# Patient Record
Sex: Male | Born: 1950 | Race: White | Hispanic: No | Marital: Married | Smoking: Never smoker
Health system: Southern US, Community
[De-identification: ages and names within clinical notes are randomized; demographics above are authoritative.]

## PROBLEM LIST (undated history)

## (undated) DIAGNOSIS — C801 Malignant (primary) neoplasm, unspecified: Secondary | ICD-10-CM

## (undated) DIAGNOSIS — C61 Malignant neoplasm of prostate: Secondary | ICD-10-CM

## (undated) DIAGNOSIS — E785 Hyperlipidemia, unspecified: Secondary | ICD-10-CM

## (undated) DIAGNOSIS — K219 Gastro-esophageal reflux disease without esophagitis: Secondary | ICD-10-CM

## (undated) DIAGNOSIS — T8859XA Other complications of anesthesia, initial encounter: Secondary | ICD-10-CM

## (undated) DIAGNOSIS — H9190 Unspecified hearing loss, unspecified ear: Secondary | ICD-10-CM

## (undated) DIAGNOSIS — K5900 Constipation, unspecified: Secondary | ICD-10-CM

## (undated) DIAGNOSIS — M1990 Unspecified osteoarthritis, unspecified site: Secondary | ICD-10-CM

## (undated) DIAGNOSIS — F909 Attention-deficit hyperactivity disorder, unspecified type: Secondary | ICD-10-CM

## (undated) DIAGNOSIS — T7840XA Allergy, unspecified, initial encounter: Secondary | ICD-10-CM

## (undated) DIAGNOSIS — Z85828 Personal history of other malignant neoplasm of skin: Secondary | ICD-10-CM

## (undated) DIAGNOSIS — H269 Unspecified cataract: Secondary | ICD-10-CM

## (undated) HISTORY — DX: Malignant neoplasm of prostate: C61

## (undated) HISTORY — DX: Personal history of other malignant neoplasm of skin: Z85.828

## (undated) HISTORY — DX: Hyperlipidemia, unspecified: E78.5

## (undated) HISTORY — PX: EYE SURGERY: SHX253

## (undated) HISTORY — PX: CATARACT EXTRACTION, BILATERAL: SHX1313

## (undated) HISTORY — PX: VASECTOMY: SHX75

## (undated) HISTORY — DX: Attention-deficit hyperactivity disorder, unspecified type: F90.9

## (undated) HISTORY — PX: TONSILLECTOMY AND ADENOIDECTOMY: SUR1326

## (undated) HISTORY — DX: Unspecified cataract: H26.9

## (undated) HISTORY — DX: Constipation, unspecified: K59.00

## (undated) HISTORY — PX: POLYPECTOMY: SHX149

## (undated) HISTORY — DX: Allergy, unspecified, initial encounter: T78.40XA

## (undated) HISTORY — DX: Unspecified hearing loss, unspecified ear: H91.90

---

## 1992-08-29 HISTORY — PX: ELBOW SURGERY: SHX618

## 2001-07-17 ENCOUNTER — Encounter: Admission: RE | Admit: 2001-07-17 | Discharge: 2001-08-23 | Payer: Self-pay | Admitting: Occupational Medicine

## 2002-03-30 ENCOUNTER — Emergency Department (HOSPITAL_COMMUNITY): Admission: EM | Admit: 2002-03-30 | Discharge: 2002-03-31 | Payer: Self-pay | Admitting: *Deleted

## 2002-08-29 HISTORY — PX: SHOULDER ARTHROSCOPY: SHX128

## 2002-10-09 ENCOUNTER — Ambulatory Visit (HOSPITAL_BASED_OUTPATIENT_CLINIC_OR_DEPARTMENT_OTHER): Admission: RE | Admit: 2002-10-09 | Discharge: 2002-10-09 | Payer: Self-pay | Admitting: Orthopedic Surgery

## 2003-08-30 HISTORY — PX: CHOLECYSTECTOMY: SHX55

## 2003-08-30 HISTORY — PX: HERNIA REPAIR: SHX51

## 2003-09-16 ENCOUNTER — Encounter: Admission: RE | Admit: 2003-09-16 | Discharge: 2003-09-16 | Payer: Self-pay | Admitting: Internal Medicine

## 2003-09-24 ENCOUNTER — Encounter: Admission: RE | Admit: 2003-09-24 | Discharge: 2003-09-24 | Payer: Self-pay | Admitting: Internal Medicine

## 2003-10-16 ENCOUNTER — Observation Stay (HOSPITAL_COMMUNITY): Admission: RE | Admit: 2003-10-16 | Discharge: 2003-10-17 | Payer: Self-pay | Admitting: Surgery

## 2003-10-16 ENCOUNTER — Encounter (INDEPENDENT_AMBULATORY_CARE_PROVIDER_SITE_OTHER): Payer: Self-pay | Admitting: Specialist

## 2003-10-20 ENCOUNTER — Emergency Department (HOSPITAL_COMMUNITY): Admission: EM | Admit: 2003-10-20 | Discharge: 2003-10-20 | Payer: Self-pay | Admitting: Emergency Medicine

## 2004-02-20 ENCOUNTER — Encounter: Admission: RE | Admit: 2004-02-20 | Discharge: 2004-02-20 | Payer: Self-pay | Admitting: Internal Medicine

## 2004-03-29 ENCOUNTER — Ambulatory Visit (HOSPITAL_COMMUNITY): Admission: RE | Admit: 2004-03-29 | Discharge: 2004-03-29 | Payer: Self-pay | Admitting: Surgery

## 2004-03-29 ENCOUNTER — Encounter (INDEPENDENT_AMBULATORY_CARE_PROVIDER_SITE_OTHER): Payer: Self-pay | Admitting: *Deleted

## 2004-03-29 ENCOUNTER — Ambulatory Visit (HOSPITAL_BASED_OUTPATIENT_CLINIC_OR_DEPARTMENT_OTHER): Admission: RE | Admit: 2004-03-29 | Discharge: 2004-03-29 | Payer: Self-pay | Admitting: Surgery

## 2004-04-20 ENCOUNTER — Encounter: Admission: RE | Admit: 2004-04-20 | Discharge: 2004-04-20 | Payer: Self-pay | Admitting: Internal Medicine

## 2004-08-29 HISTORY — PX: INNER EAR SURGERY: SHX679

## 2005-02-16 ENCOUNTER — Ambulatory Visit: Payer: Self-pay | Admitting: Internal Medicine

## 2005-03-30 ENCOUNTER — Ambulatory Visit: Payer: Self-pay | Admitting: Internal Medicine

## 2005-09-07 ENCOUNTER — Ambulatory Visit: Payer: Self-pay | Admitting: Internal Medicine

## 2005-09-21 ENCOUNTER — Ambulatory Visit: Payer: Self-pay | Admitting: Internal Medicine

## 2006-01-29 ENCOUNTER — Emergency Department (HOSPITAL_COMMUNITY): Admission: EM | Admit: 2006-01-29 | Discharge: 2006-01-29 | Payer: Self-pay | Admitting: Emergency Medicine

## 2006-05-04 ENCOUNTER — Ambulatory Visit: Payer: Self-pay | Admitting: Internal Medicine

## 2006-08-29 HISTORY — PX: COLONOSCOPY: SHX174

## 2006-11-07 ENCOUNTER — Ambulatory Visit: Payer: Self-pay | Admitting: Gastroenterology

## 2006-11-16 ENCOUNTER — Ambulatory Visit: Payer: Self-pay | Admitting: Internal Medicine

## 2006-11-16 LAB — CONVERTED CEMR LAB
AST: 28 units/L (ref 0–37)
Albumin: 4.2 g/dL (ref 3.5–5.2)
Basophils Absolute: 0 10*3/uL (ref 0.0–0.1)
Basophils Relative: 0.7 % (ref 0.0–1.0)
CO2: 32 meq/L (ref 19–32)
Chloride: 105 meq/L (ref 96–112)
Creatinine, Ser: 0.9 mg/dL (ref 0.4–1.5)
Eosinophils Relative: 1.4 % (ref 0.0–5.0)
HCT: 44.1 % (ref 39.0–52.0)
Hemoglobin: 15.1 g/dL (ref 13.0–17.0)
Hgb A1c MFr Bld: 6 % (ref 4.6–6.0)
Monocytes Absolute: 0.5 10*3/uL (ref 0.2–0.7)
Neutrophils Relative %: 56.5 % (ref 43.0–77.0)
PSA: 3.61 ng/mL (ref 0.10–4.00)
Potassium: 4.5 meq/L (ref 3.5–5.1)
RBC: 5.08 M/uL (ref 4.22–5.81)
RDW: 12.8 % (ref 11.5–14.6)
Sodium: 141 meq/L (ref 135–145)
Total Bilirubin: 1.1 mg/dL (ref 0.3–1.2)
Total Protein: 7.2 g/dL (ref 6.0–8.3)
WBC: 5 10*3/uL (ref 4.5–10.5)

## 2006-11-21 ENCOUNTER — Encounter: Payer: Self-pay | Admitting: Internal Medicine

## 2006-11-21 ENCOUNTER — Ambulatory Visit: Payer: Self-pay | Admitting: Gastroenterology

## 2007-02-16 ENCOUNTER — Telehealth (INDEPENDENT_AMBULATORY_CARE_PROVIDER_SITE_OTHER): Payer: Self-pay | Admitting: *Deleted

## 2007-07-10 ENCOUNTER — Ambulatory Visit: Payer: Self-pay | Admitting: Internal Medicine

## 2007-07-10 DIAGNOSIS — E782 Mixed hyperlipidemia: Secondary | ICD-10-CM

## 2008-11-10 ENCOUNTER — Encounter: Admission: RE | Admit: 2008-11-10 | Discharge: 2008-11-10 | Payer: Self-pay | Admitting: General Surgery

## 2008-11-13 ENCOUNTER — Ambulatory Visit (HOSPITAL_BASED_OUTPATIENT_CLINIC_OR_DEPARTMENT_OTHER): Admission: RE | Admit: 2008-11-13 | Discharge: 2008-11-13 | Payer: Self-pay | Admitting: General Surgery

## 2008-11-13 ENCOUNTER — Encounter (INDEPENDENT_AMBULATORY_CARE_PROVIDER_SITE_OTHER): Payer: Self-pay | Admitting: General Surgery

## 2009-08-29 HISTORY — PX: INNER EAR SURGERY: SHX679

## 2009-10-23 ENCOUNTER — Telehealth: Payer: Self-pay | Admitting: Internal Medicine

## 2009-11-12 ENCOUNTER — Encounter: Payer: Self-pay | Admitting: Internal Medicine

## 2009-11-30 ENCOUNTER — Ambulatory Visit: Payer: Self-pay | Admitting: Internal Medicine

## 2009-11-30 DIAGNOSIS — D3500 Benign neoplasm of unspecified adrenal gland: Secondary | ICD-10-CM

## 2009-11-30 DIAGNOSIS — Z85820 Personal history of malignant melanoma of skin: Secondary | ICD-10-CM

## 2009-11-30 DIAGNOSIS — R930 Abnormal findings on diagnostic imaging of skull and head, not elsewhere classified: Secondary | ICD-10-CM | POA: Insufficient documentation

## 2009-11-30 DIAGNOSIS — R9389 Abnormal findings on diagnostic imaging of other specified body structures: Secondary | ICD-10-CM

## 2009-11-30 DIAGNOSIS — R739 Hyperglycemia, unspecified: Secondary | ICD-10-CM

## 2009-12-11 ENCOUNTER — Ambulatory Visit: Payer: Self-pay | Admitting: Internal Medicine

## 2009-12-14 LAB — CONVERTED CEMR LAB
AST: 26 units/L (ref 0–37)
Albumin: 4.3 g/dL (ref 3.5–5.2)
Alkaline Phosphatase: 52 units/L (ref 39–117)
BUN: 14 mg/dL (ref 6–23)
Basophils Absolute: 0 10*3/uL (ref 0.0–0.1)
Bilirubin Urine: NEGATIVE
Bilirubin, Direct: 0.2 mg/dL (ref 0.0–0.3)
Chloride: 102 meq/L (ref 96–112)
Direct LDL: 136.3 mg/dL
Eosinophils Absolute: 0.1 10*3/uL (ref 0.0–0.7)
Glucose, Bld: 112 mg/dL — ABNORMAL HIGH (ref 70–99)
HDL: 46.8 mg/dL (ref >39.00)
Hemoglobin: 14.6 g/dL (ref 13.0–17.0)
Leukocytes, UA: NEGATIVE
Lymphocytes Relative: 36.6 % (ref 12.0–46.0)
Monocytes Relative: 9.3 % (ref 3.0–12.0)
Neutro Abs: 2.4 10*3/uL (ref 1.4–7.7)
Neutrophils Relative %: 50.6 % (ref 43.0–77.0)
Nitrite: NEGATIVE
PSA: 5.61 ng/mL — ABNORMAL HIGH (ref 0.10–4.00)
Potassium: 3.8 meq/L (ref 3.5–5.1)
RBC: 4.76 M/uL (ref 4.22–5.81)
RDW: 13.9 % (ref 11.5–14.6)
Sodium: 139 meq/L (ref 135–145)
Specific Gravity, Urine: >=1.03 (ref 1.000–1.030)
Total CHOL/HDL Ratio: 5
Total Protein, Urine: NEGATIVE mg/dL
VLDL: 32.6 mg/dL (ref 0.0–40.0)
pH: 5.5 (ref 5.0–8.0)

## 2009-12-21 ENCOUNTER — Ambulatory Visit: Payer: Self-pay | Admitting: Internal Medicine

## 2010-01-22 ENCOUNTER — Telehealth (INDEPENDENT_AMBULATORY_CARE_PROVIDER_SITE_OTHER): Payer: Self-pay | Admitting: *Deleted

## 2010-01-22 DIAGNOSIS — K219 Gastro-esophageal reflux disease without esophagitis: Secondary | ICD-10-CM

## 2010-01-26 ENCOUNTER — Encounter: Payer: Self-pay | Admitting: Internal Medicine

## 2010-09-28 NOTE — Medication Information (Signed)
Summary: Prior Authorization for Omeprazole/[facility]  Prior Authorization for Omeprazole/[employer]   Imported By: Lanelle Bal 02/12/2010 09:22:12  _____________________________________________________________________  External Attachment:    Type:   Image     Comment:   External Document

## 2010-09-28 NOTE — Medication Information (Signed)
Summary: Approval for Additional Quantity Omeprazole/[facility]  Approval for Additional Quantity Omeprazole/[employer]   Imported By: Lanelle Bal 02/03/2010 12:59:16  _____________________________________________________________________  External Attachment:    Type:   Image     Comment:   External Document

## 2010-09-28 NOTE — Assessment & Plan Note (Signed)
Summary: cpx- jr   Vital Signs:  Patient profile:   59 year old male Height:      74.75 inches Weight:      213.4 pounds BMI:     26.95 Temp:     97.9 degrees F oral Pulse rate:   64 / minute Resp:     16 per minute BP sitting:   130 / 74  (left arm) Cuff size:   large  Vitals Entered By: Shonna Chock (November 30, 2009 8:36 AM)  Comments REVIEWED MED LIST, PATIENT AGREED DOSE AND INSTRUCTION CORRECT    History of Present Illness: Mr. Emond is here for a physical; he is  being actively treated by Dr Ermalinda Barrios MD for hearing dysfunction,due to Otosclerosis , S/P surgery X 3 .  Additional surgery planned 12/28/2009.  Preventive Screening-Counseling & Management  Alcohol-Tobacco     Smoking Status: never  Caffeine-Diet-Exercise     Does Patient Exercise: yes  Allergies (verified): No Known Drug Allergies  Past History:  Past Medical History: ADD ; Fasting Hyperglycemia 2005;Abnormal  CT scan : 8 mm & 5 mm nodules  LLL & 1.3 cm  R Adrenal Adenoma  2005 Hyperlipidemia Colonic polyps, hx of Skin cancer, hx of, Basal Cell , Dr Donzetta Starch, Derm  Past Surgical History: Ear Surgery X 3:02/ 2006 Incus Transposition with prosthesis; 01/2005 Oscillculoplasty with prosthesis replacement ;2011 Prosthesis removed, Dr Dorma Russell; Arthroscopy 2004; Elbow Surgery 1995 & 1996; Cholecystectomy 2006 Inguinal herniorrhaphy 2005 Tonsillectomy Vasectomy Colon polypectomy : Hyperplastic  2008, Dr Arlyce Dice Mohs Surgery L ear , PMH of ; Fistula in Ano  repair 2010, Dr Zachery Dakins  Family History: Father: DM,dialysis,PNA,AAA Mother: diverticulosis with rectal bleeding Siblings: bro d "Sudden Death" @ 67, CAD,MI @ 50, lipids, DM; M aunt colon CA; PGF d MI in 89s; MGF d in 22s, ? jaundiced  Social History: Occupation: Runner, broadcasting/film/video @ [employer] Separated Never Smoked Alcohol use-yes: socially Regular exercise-yes3X/week 1-1.5 mpd Does Patient Exercise:  yes  Review of Systems  The patient denies  anorexia, fever, weight loss, weight gain, vision loss, chest pain, syncope, dyspnea on exertion, peripheral edema, prolonged cough, headaches, hemoptysis, abdominal pain, melena, hematochezia, severe indigestion/heartburn, unusual weight change, abnormal bleeding, enlarged lymph nodes, and angioedema.   ENT:  See HPI; Complains of decreased hearing and ringing in ears; denies difficulty swallowing; Intermittent hoarseness. Chronic tinnitus.[name] Kitchen Psych:  Complains of anxiety, easily angered, easily tearful, and irritability; denies depression; Marital  & job stressors impacted by hearing loss on R.  Physical Exam  General:  well-nourished; alert,appropriate and cooperative throughout examination Head:  Normocephalic and atraumatic without obvious abnormalities. Eyes:  No corneal or conjunctival inflammation noted.Perrla. Funduscopic exam benign, without hemorrhages, exudates or papilledema.  Ears:  L ear normal.  R TM scarred Nose:  External nasal examination shows no deformity or inflammation. Nasal mucosa are pink and moist without lesions or exudates. Mouth:  Oral mucosa and oropharynx without lesions or exudates.  Teeth in good repair. Neck:  No deformities, masses, or tenderness noted. Lungs:  Normal respiratory effort, chest expands symmetrically. Lungs are clear to auscultation, no crackles or wheezes. Heart:  Normal rate and regular rhythm. S1 and S2 normal without gallop, murmur, click, rub . S4 @ R base Abdomen:  Bowel sounds positive,abdomen soft and non-tender without masses, organomegaly or hernias noted. Rectal:  No external abnormalities noted. Normal sphincter tone. No rectal masses or tenderness. Genitalia:  Testes bilaterally descended without nodularity, tenderness or masses. No scrotal masses  or lesions. No penis lesions or urethral discharge. L varicocele.   Prostate:  Prostate gland firm and smooth, no enlargement, nodularity, tenderness, mass, asymmetry. Slight induration R  prostate. Msk:  No deformity or scoliosis noted of thoracic or lumbar spine.   Pulses:  R and L carotid,radial,dorsalis pedis and posterior tibial pulses are full and equal bilaterally Extremities:  No clubbing, cyanosis, edema, or deformity noted with normal full range of motion of all joints.   Neurologic:  alert & oriented X3 and DTRs symmetrical and normal.   Skin:  Myriad nevi(seen annually by derm) Cervical Nodes:  No lymphadenopathy noted Axillary Nodes:  No palpable lymphadenopathy Inguinal Nodes:  No significant adenopathy Psych:  memory intact for recent and remote, normally interactive, good eye contact, not anxious appearing, and not depressed appearing.     Impression & Recommendations:  Problem # 1:  ROUTINE GENERAL MEDICAL EXAM@HEALTH  CARE FACL (ICD-V70.0)  Orders: T-2 View CXR (71020TC)  Problem # 2:  HYPERLIPIDEMIA (ICD-272.2)  The following medications were removed from the medication list:    Pravachol 40 Mg Tabs (Pravastatin sodium) .[name] Kitchen... 1 qhs  Problem # 3:  COLONIC POLYPS, HX OF (ICD-V12.72) as per Dr Arlyce Dice  Problem # 4:  SKIN CANCER, HX OF (ICD-V10.83) as per Dr Yetta Barre  Problem # 5:  CT, CHEST, ABNORMAL (ICD-793.1) 2 tiny nodules LLL , stable on followup  Problem # 6:  BENIGN NEOPLASM OF ADRENAL GLAND (ICD-227.0) 1.3 cm R Adrenal Adenoma 2005, incidentally noted , stable over 7 months. (Note : cholecystectomy done subsequently)  Complete Medication List: 1)  Cialis 20 Mg Tabs (Tadalafil) .[name] Kitchen.. 1 q 3 days as needed 2)  Dexedrine 10 Mg Cp24 (Dextroamphetamine sulfate) .[name] Kitchen.. 1 by mouth qd 3)  Xanax 0.5 Mg Tabs (Alprazolam) .... 1/2 to 1 tab qhs 4)  Omeprazole 20 Mg Cpdr (Omeprazole) .[name] Kitchen.. 1 by mouth qd  Other Orders: Tdap => 81yrs IM (16109) Admin 1st Vaccine ([address])  Patient Instructions: 1)  Schedule fasting labs @ [facility].Codes : V70.0 ,790.29,272.4,995.20   Immunizations Administered:  Tetanus Vaccine:    Vaccine Type: Tdap    Site:  right deltoid    Mfr: GlaxoSmithKline    Dose: 0.5 ml    Route: IM    Given by: Chrae Malloy    Exp. Date: 11/21/2011    Lot #: [id number]    VIS given: 07/17/07 version given November 30, 2009.

## 2010-09-28 NOTE — Progress Notes (Signed)
Summary: PATIENT COMPLAINT?  Phone Note Call from Patient Call back at [phone] CELL   Caller: Patient Call For: Marga Melnick MD Reason for Call: Talk to Doctor Summary of Call: PATIENT CALLING, STATES HE IS A PATIENT & FRIEND OF DR. Ranell Skibinski'S, AND NEEDS TO SPEAK W/DR. Sami Froh ABOUT AN ISSUE THAT HE HAD HERE AT OUR OFFICE. Initial call taken by: Magdalen Spatz [facility],  October 23, 2009 8:10 AM  Follow-up for Phone Call        Patient called back to speak with the office manager.  I spoke with him and determined that the rx was called in to the wrong pharmacy under the wrong name.  I called in the Prilosec OTC 20mg  1poqd pre breakfast #90 x3 to [facility] on [address]  Blain Pais 10/23/09 12:30p  Additional Follow-up for Phone Call Additional follow up Details #1::        thank you for handling this. Hopp Additional Follow-up by: Marga Melnick MD,  October 23, 2009 2:47 PM

## 2010-09-28 NOTE — Progress Notes (Signed)
Summary: prior auth APPROVED for OMEPRAZOLE//[employer]  Phone Note Refill Request   Refills Requested: Medication #1:  OMEPRAZOLE 20 MG  CPDR 1 by mouth qd. prior Berkley Harvey - [phone]  Initial call taken by: Okey Regal [place],  Jan 22, 2010 10:45 AM  Follow-up for Phone Call        awaiting fax..................[name] KitchenFelecia Deloach CMA  Jan 22, 2010 3:31 PM   PLS VERIFY DX FOR MED..............[name] KitchenFelecia Deloach CMA  Jan 22, 2010 4:40 PM   Additional Follow-up for Phone Call Additional follow up Details #1::        530.81, GERD in context of marital separation & suboptimal diet Additional Follow-up by: Marga Melnick MD,  Jan 23, 2010 6:29 AM  New Problems: GERD, SEVERE (ICD-530.81)   Additional Follow-up for Phone Call Additional follow up Details #2::    prior auth faxed back awaiting response....................[name] KitchenFelecia Deloach CMA  Jan 26, 2010 10:15 AM   PRIOR AUTH APPROVED FROM 01/05/10 TO 01/26/2011  New Problems: GERD, SEVERE (ICD-530.81)

## 2010-12-09 LAB — COMPREHENSIVE METABOLIC PANEL
CO2: 28 mEq/L (ref 19–32)
Calcium: 9.4 mg/dL (ref 8.4–10.5)
Creatinine, Ser: 1.06 mg/dL (ref 0.4–1.5)
GFR calc Af Amer: >60 mL/min (ref >60)
GFR calc non Af Amer: >60 mL/min (ref >60)
Glucose, Bld: 110 mg/dL — ABNORMAL HIGH (ref 70–99)
Total Protein: 6.5 g/dL (ref 6.0–8.3)

## 2010-12-09 LAB — DIFFERENTIAL
Lymphocytes Relative: 33 % (ref 12–46)
Lymphs Abs: 1.8 10*3/uL (ref 0.7–4.0)
Monocytes Relative: 10 % (ref 3–12)
Neutro Abs: 3.1 10*3/uL (ref 1.7–7.7)
Neutrophils Relative %: 55 % (ref 43–77)

## 2010-12-09 LAB — CBC
Hemoglobin: 13.1 g/dL (ref 13.0–17.0)
MCHC: 33.2 g/dL (ref 30.0–36.0)
MCV: 89.3 fL (ref 78.0–100.0)
RBC: 4.41 MIL/uL (ref 4.22–5.81)
RDW: 13.3 % (ref 11.5–15.5)

## 2011-01-11 NOTE — Op Note (Signed)
NAMEVICTOR, LANGENBACH NO.:  [id number]   MEDICAL RECORD NO.:  [id number]          PATIENT TYPE:  AMB   LOCATION:  [facility]                         FACILITY:  [facility]   PHYSICIAN:  Anselm Pancoast. 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   PROCEDURE PERFORMED:  Excision of fistula tract, right lateral perianal  area.   HISTORY:  Yasir Kitner is a patient of Lona Kettle, his regular  physician, and I first saw him approximately 4 years ago when he had an  area to the outside of the anus on the right that has a little external  opening.  You could get a probe in the area.  I was never able to find  an internal fistula opening, but it looked like there was possibly some  type of cyst or something, and this just kind of continued to be an  irritating problem.  In the office you could feel a little fibrous tract  going up to the dentate line, but I could not get anything to come  through the area and I could not see any obvious hemorrhoids.  He  desired to go ahead and have this area excised.  He is a Teacher, early years/pre.  I recommend that this be done so hopefully he would  not have to go to class the following day, but hopefully be back to  class in just a few days.  He is here for the planned procedure.  He did  take a bottle of magnesium citrate yesterday afternoon.   DESCRIPTION OF PROCEDURE:  On examination when he went into the  operating room and we gave him, I think, 400 mg of Cipro, I could see  the little fistula's opening.  Induction of anesthesia with an LOA tube  and then we placed him in yellow fin stirrups in the lithotomy position.  In this position, you could see the little external probe and feel the  little fibrous tract under it.  I prepped him with Hibiclens soap and  then Betadine solution and  then draped him in a sterile manner.  First I  put a little probe in the external opening.  It goes in about 3/4 of an  inch.  With the anoscope you could see the probe kind of going up to the  dentate line, but it does not actually go up high and it is all  superficial to the sphincter.  I then used a little bit of Betadine  solution with a 20 gauge angiocatheter and injected the area and you  could see the little opening where it drains really right at the dentate  line area.  I then actually took an Allis and kind of pinched the  fibrous tract, replacing the probe, and then used the cautery to just  basically excise this little fibrous tract since it is less than an inch  in length.  It did not go deep to the internal sphincter,  so there was  no actual sphincterotomy done.  I was surprised when you examine him in  this position with general anesthesia that he has more of an internal  hemorrhoid on this right lateral quadrant.  I think that it has probably  been irritated from this little inflammatory process and did not do an  actual formal internal-external hemorrhoidectomy since I really have not  found symptoms consistent with basically hemorrhoidal bleeding, etc.  He  also had 1 little tag kind of posteriorly from where it looks like he  has had a little previous irritated external hemorrhoid, and I actually  just excised that and then I did put a couple of 3-0 chromic sutures,  figure-of-eights, at the apex of this area since the underlying  hemorrhoid, I think, would bleed without the little sutures.  Then I put  a figure-of-eight suture where this little external tag had been excised  a little posteriorly.  I did not anesthetize him since he is having  significant diarrhea just kind of draining into the field with the  Marcaine, but put the hemorroid ointment on a gauze over the little  operative areas for postoperative pain and spasm.  He will be released  after a short stay in  the recovery room.  Soaking in the tub on a twice  daily basis.  I will see him back in the office in  approximately 1 week.  Hopefully this will correct the problem.  We will  examine it.  I am sure it is just a little fibrous tract, and hopefully  this internal hemorrhoid that looks a little inflamed will subside after  this problem has been corrected.      Anselm Pancoast. Zachery Dakins, M.D.  Electronically Signed     WJW/MEDQ  D:  11/13/2008  T:  11/13/2008  Job:  [id number]   cc:   Titus Dubin. Alwyn Ren, MD,FACP,FCCP  [phone] [place]  [place] [address]

## 2011-01-14 NOTE — Op Note (Signed)
NAME:  LOT, MEDFORD NO.:  [id number]   MEDICAL RECORD NO.:  [id number]                   PATIENT TYPE:  EMS   LOCATION:  ED                                   FACILITY:  [facility]   PHYSICIAN:  Sandria Bales. Ezzard Standing, M.D.               DATE OF BIRTH:  1950-10-25   DATE OF PROCEDURE:  10/20/2003  DATE OF DISCHARGE:                                 OPERATIVE REPORT   HISTORY:  This is a 60 year old white male, who underwent a laparoscopic  cholecystectomy 4 days ago on the 17th of February 2005.  The hospital  course was fairly uneventful, though I spoke with Dr. Jamey Ripa, and they did  find a large gallstone that he had.  He did well until last night or this  morning, when he developed significant abdominal distention, nausea.  He  said he vomited once and actually had some drainage from his umbilical  incision.   He has vomited a couple of times since he has been in the emergency room.  He was originally seen by the emergency room and then seen by [place].   His remainder of his history is noncontributory.   PHYSICAL EXAMINATION:  VITAL SIGNS:  Temperature 97.1, blood pressure  131/91, pulse 80, respirations 30.  GENERAL:  Well-nourished white male, alert and cooperative on physical exam.  LUNGS:  Clear to auscultation.  HEART:  Regular rate and rhythm.  I hear no murmur or rub.  ABDOMEN:  He actually has some bowel sounds that are present, may be  decreased.  He has bruise around his umbilicus consistent with an early  postoperative wound.  I do not see any evidence of infection, redness.  He  has no guarding or rebound.  No peritoneal signs that I can see.   LABORATORY DATA:  White blood count 12,500, hemoglobin 16.1, hematocrit 47.  His electrolyte show a sodium of 136, potassium 3.9, chloride 106, CO2 25.  His amylase is 94.  His urinalysis is unremarkable.  His lipase is 28.  I  did obtain a hepatobiliary scan on him which showed no evidence of leakage.  His  chest x-ray was unremarkable.   IMPRESSION:  It is unclear why he is nauseated.  There is no clear  correlation I could see related to the surgery that he had as far as his  nausea.  He is actually feeling better now after being in the emergency room  for several hours.  He is actually hungry.  He had a little bit of a headache and wants to go  home.  He is going to check back with Dr. Jamey Ripa tomorrow morning in his  office.  If he is still feeling better, feeling comfortable.  Otherwise, he  will see Dr. Jamey Ripa back in 1-2 weeks.  Sandria Bales. Ezzard Standing, M.D.    DHN/MEDQ  D:  10/20/2003  T:  10/20/2003  Job:  [id number]   cc:   Currie Paris, M.D.  [address], [address]  [place]  [place] [address]  Fax: [phone]

## 2011-01-14 NOTE — Op Note (Signed)
NAME:  Peter Novak, Peter Novak NO.:  [id number]   MEDICAL RECORD NO.:  [id number]                   PATIENT TYPE:  AMB   LOCATION:  [facility]                                  FACILITY:  [facility]   PHYSICIAN:  Currie Paris, M.D.           DATE OF BIRTH:  1951/08/21   DATE OF PROCEDURE:  03/29/2004  DATE OF DISCHARGE:                                 OPERATIVE REPORT   OFFICE MEDICAL RECORD NUMBER:  [id number].   PREOPERATIVE DIAGNOSIS:  Right inguinal hernia, probably indirect.   POSTOPERATIVE DIAGNOSIS:  Indirect right inguinal hernia.   OPERATION:  Repair with mesh.   SURGEON:  Currie Paris, M.D.   ANESTHESIA:  MAC.   CLINICAL HISTORY:  This patient is a 60 year old who presented recently with  a right inguinal hernia.  He elected to have this repaired.   DESCRIPTION OF PROCEDURE:  The patient seen in the holding area and he had  already initialed the right inguinal area as the operative site and I  confirmed and marked it as well.   He was taken to the operating room and given IV sedation.  The groin area  was clipped, prepped and draped.  Combination of 1% Xylocaine with  epinephrine plus 0.5% plain Marcaine was mixed equally and used for local.   I infiltrated along the incision line and at right angles as well.  I also  infiltrated below the fascia above the anterior superior iliac spine.  The  incision was made and deepened to the external oblique aponeurosis with  additional local infiltrate throughout the deeper layers.  Bleeders were  either tied or coagulated.   The external oblique was opened in line of its fibers and cord elevated off  the inguinal floor and surrounding with the Penrose drain.  The cord was  fairly thickened.  The inguinal floor was attenuated but intact.   Upon dissecting into the cord, I identified the hernia sac.  Once this was  opened, I found it was very densely adherent to the rest of the cord  structures but I  was able to bluntly sweep the cord structures away from the  sac using an opened moistened 4 x 4 gauze.  When I got down to the base I  pulled this up and twisted it, suture ligated it with 3-0 silk, tied it with  a 3-0 silk and amputated it.  It retracted neatly under the deep ring.  A  little bit of lipomatous tissue was trimmed off as well.  Some of the  cremaster had to be divided to get good exposure.  There was still a fair  amount of fatty tissue on the cord itself about mid position of the cord,  but I elected to leave this alone rather than risk injury to the vascular  supply to the testis by dissecting this out any further.   I then took a piece of Bard  mesh and used this for the patch.  This was cut  in shape and split laterally.  It was sutured in starting to overlap the  pubic tubercle and running from medial to lateral along the inferior edge  until we got to the deep ring.  It then was laid gently under the external  oblique aponeurosis and onto the internal oblique and tacked with several  more sutures.  I went well lateral to the deep ring and the tails were  crossed and tacked together as well.  What appeared to be ilioinguinal nerve  was kept with the cord structures.   Everything appeared to be dry.  I put additional local in and then we closed  using a running 3-0 Vicryl to close the external oblique aponeurosis, 3-0  Vicryl around the Scarpa's, and a 4-0 Monocryl subcuticular plus Dermabond.   The patient tolerated the procedure well.  There were no operative  complications.  All counts were correct.                                               Currie Paris, M.D.    CJS/MEDQ  D:  03/29/2004  T:  03/29/2004  Job:  [id number]   cc:   Titus Dubin. Alwyn Ren, M.D. [facility]

## 2011-01-14 NOTE — Op Note (Signed)
NAME:  Peter Novak, Peter Novak                        ACCOUNT NO.:  [id number]   MEDICAL RECORD NO.:  [id number]                   PATIENT TYPE:  AMB   LOCATION:  DAY                                  FACILITY:  [facility]   PHYSICIAN:  Currie Paris, M.D.           DATE OF BIRTH:  1951-05-18   DATE OF PROCEDURE:  10/16/2003  DATE OF DISCHARGE:                                 OPERATIVE REPORT   CCS [phone].   PREOPERATIVE DIAGNOSIS:  Gallbladder mass, question polyp versus stone.   POSTOPERATIVE DIAGNOSIS:  Cholelithiasis with chronic cholecystitis.   OPERATION:  1. Laparoscopic cholecystectomy, operative cholangiogram.  2. Intraoperative ultrasound of gallbladder.   SURGEON:  Currie Paris, M.D.   ASSISTANT:  Angelia Mould. Derrell Lolling, M.D.   ANESTHESIA:  General endotracheal.   CLINICAL HISTORY:  This patient is a 60 year old who had an ultrasound done  of his abdomen looking for an aneurysm but ended up having a mass in the  gallbladder.  Further evaluation with CT suggested the polypoid mass could  definitely be a stone and suggests the possibility of a large gallbladder  polyp.  In addition, an incidentally-noted small adrenal mass was found.  Follow-up MRI showed the adrenal mass to appear to be a benign adenoma, but  the gallbladder mass looked more consistent with a gallstone.  Ultrasound  had suggested it had some blood flow within the mass, which raised the  question of it being a polyp.   DESCRIPTION OF PROCEDURE:  The patient was seen in the holding area and had  no further questions.  He was taken to the operating room and after  satisfactory general endotracheal anesthesia had been obtained, the abdomen  was clipped, prepped and draped.  Marcaine 0.25% plain was used for each  incision.  The umbilical incision was made first, the fascia opened, and the  peritoneal cavity entered under direct vision.  A 10/11 trocar was placed in  the epigastrium and two 5's laterally.   Exam of the liver showed it to be  normal.  The gallbladder appeared normal with no significant thickening of  the wall and no evidence of any tumor.  We put the abdominal probe in and  ran it over the gallbladder on the liver, and the mass in the gallbladder  appeared to be most consistent with a stone with shadowing.  There did not  appear to be flow.  The ultrasound was removed.  The gallbladder was grasped  and retracted over the liver.  Some omental adhesions around the bottom of  the gallbladder were taken off and the triangle of Calot identified.  A  small branch of the cystic artery was clipped as it was coming onto the  gallbladder and the cystic duct dissected out for a distance of about 2.5  cm.  It was clipped near the junction of the gallbladder and opened.  A Cook  catheter was introduced,  placed in the cystic duct, and held with a clip.  Operative cholangiography appeared normal with swift filling of the cystic  duct, common duct, and hepatic radicles and duodenum.   The cystic duct catheter was removed and three clips placed on the stay side  of the cystic duct, and it was divided.  A small cystic duct node was  dissected down and the cystic artery identified, triply clipped, and  divided.  Another branch was clipped and divided and then the gallbladder  carefully removed from below to above.  There were some chronic inflammatory  changes and not a good plane of dissection behind the gallbladder along the  bed of the liver, and so we really had almost an intracorporeal gallbladder  here to deal with and it was fairly dilated, so there was a wide plane to  work with and this took several minutes.  Once the gallbladder was  disconnected, it was placed in a bag.  I spent several minutes irrigating  and making sure everything was dry and cauterized the bed of the  gallbladder, watched it for several minutes, then checked the clips.  Everything appeared to be okay.  The  gallbladder was then brought out of the  umbilical port.  In the process of bringing it out we had to enlarge this  port by about 50% in order to get the large stone out, but it came out in  the gallbladder without any spillage.  That was closed with the pursestring  that was placed plus an additional suture.  We made a final irrigation check  for hemostasis using the lateral ports, and they were removed.  The abdomen  was deflated through the epigastric port.  The skin was closed with 4-0  Monocryl subcuticular and Dermabond.   The patient tolerated the procedure well.  There were no operative  complications.  All counts were correct.                                               Currie Paris, M.D.    CJS/MEDQ  D:  10/16/2003  T:  10/16/2003  Job:  [id number]   cc:   Titus Dubin. Alwyn Ren, M.D. [facility]

## 2011-02-03 ENCOUNTER — Other Ambulatory Visit: Payer: Self-pay | Admitting: Internal Medicine

## 2011-03-22 ENCOUNTER — Ambulatory Visit (INDEPENDENT_AMBULATORY_CARE_PROVIDER_SITE_OTHER): Payer: BC Managed Care – PPO | Admitting: Internal Medicine

## 2011-03-22 ENCOUNTER — Encounter: Payer: Self-pay | Admitting: Internal Medicine

## 2011-03-22 DIAGNOSIS — K219 Gastro-esophageal reflux disease without esophagitis: Secondary | ICD-10-CM

## 2011-03-22 DIAGNOSIS — E782 Mixed hyperlipidemia: Secondary | ICD-10-CM

## 2011-03-22 DIAGNOSIS — Z85828 Personal history of other malignant neoplasm of skin: Secondary | ICD-10-CM

## 2011-03-22 DIAGNOSIS — Z0000 Encounter for general adult medical examination without abnormal findings: Secondary | ICD-10-CM

## 2011-03-22 DIAGNOSIS — R9389 Abnormal findings on diagnostic imaging of other specified body structures: Secondary | ICD-10-CM

## 2011-03-22 DIAGNOSIS — R7309 Other abnormal glucose: Secondary | ICD-10-CM

## 2011-03-22 DIAGNOSIS — N529 Male erectile dysfunction, unspecified: Secondary | ICD-10-CM

## 2011-03-22 DIAGNOSIS — H6980 Other specified disorders of Eustachian tube, unspecified ear: Secondary | ICD-10-CM

## 2011-03-22 LAB — BASIC METABOLIC PANEL
CO2: 28 mEq/L (ref 19–32)
Chloride: 110 mEq/L (ref 96–112)
GFR: 83.48 mL/min (ref >60.00)
Glucose, Bld: 118 mg/dL — ABNORMAL HIGH (ref 70–99)
Potassium: 4.6 mEq/L (ref 3.5–5.1)
Sodium: 142 mEq/L (ref 135–145)

## 2011-03-22 LAB — CBC WITH DIFFERENTIAL/PLATELET
Basophils Absolute: 0 10*3/uL (ref 0.0–0.1)
HCT: 42.2 % (ref 39.0–52.0)
Hemoglobin: 14.2 g/dL (ref 13.0–17.0)
Lymphs Abs: 1.6 10*3/uL (ref 0.7–4.0)
MCHC: 33.6 g/dL (ref 30.0–36.0)
MCV: 89.8 fl (ref 78.0–100.0)
Monocytes Absolute: 0.5 10*3/uL (ref 0.1–1.0)
Monocytes Relative: 9.3 % (ref 3.0–12.0)
Neutro Abs: 3 10*3/uL (ref 1.4–7.7)
RDW: 13.3 % (ref 11.5–14.6)

## 2011-03-22 LAB — PSA: PSA: 6.48 ng/mL — ABNORMAL HIGH (ref 0.10–4.00)

## 2011-03-22 LAB — HEPATIC FUNCTION PANEL
AST: 25 U/L (ref 0–37)
Total Bilirubin: 0.8 mg/dL (ref 0.3–1.2)

## 2011-03-22 LAB — TSH: TSH: 1.34 u[IU]/mL (ref 0.35–5.50)

## 2011-03-22 MED ORDER — TADALAFIL 20 MG PO TABS: 20.0000 mg | ORAL_TABLET | ORAL | Status: DC | PRN

## 2011-03-22 MED ORDER — OMEPRAZOLE 20 MG PO CPDR: 20.0000 mg | DELAYED_RELEASE_CAPSULE | Freq: Every day | ORAL | Status: DC

## 2011-03-22 MED ORDER — FLUTICASONE PROPIONATE 50 MCG/ACT NA SUSP: 1.0000 | Freq: Every day | NASAL | Status: DC

## 2011-03-22 NOTE — Progress Notes (Signed)
Subjective:    Patient ID: Peter Novak, male    DOB: 02-Jan-1951, 60 y.o.   MRN: [id number]  HPI  Peter Novak  is here for a physical he has no acute issues.     Review of Systems Patient reports no  Vision changes,anorexia, weight change, fever ,adenopathy, persistent  hoarseness, swallowing issues, chest pain,palpitations, edema,persistant / recurrent cough, hemoptysis, dyspnea(rest, exertional, paroxysmal nocturnal), gastrointestinal  bleeding (melena), abdominal pain, excessive heart burn, GU symptoms( dysuria, hematuria, pyuria, voiding/incontinence  issues) syncope, focal weakness, memory loss,numbness & tingling, skin/hair/nail changes, anxiety, abnormal bruising/bleeding, musculoskeletal symptoms/signs.   Despite the ear surgery by Dr. Dorma Russell, he continues to have pressure in right ear. He also has intermittent hoarseness which appears to be more seasonal. He did have mild depression which was situational after death of his mother. He feels that this is resolving. He has had a hemorrhoidectomy; but he has some bleeding with severe constipation he will have some rectal bleeding. There was a discrepancy in the history. It was listed that he had colon polyps but he denies this. His last colonoscopy was 2009; this was negative.     Objective:   Physical Exam Gen.: Healthy and well-nourished in appearance. Alert, appropriate and cooperative throughout exam. Head: Normocephalic without obvious abnormalities Eyes: No corneal or conjunctival inflammation noted. Pupils equal round reactive to light and accommodation. Fundal exam is benign without hemorrhages, exudate, papilledema. Extraocular motion intact. Vision grossly normal with lenses. Ears: External  ear exam reveals no significant lesions or deformities. Canals clear .TMs normal. Hearing is grossly decreased on R Nose: External nasal exam reveals no deformity or inflammation. Nasal mucosa are pink and moist. No lesions or exudates  noted. Mouth: Oral mucosa and oropharynx reveal no lesions or exudates. Teeth in good repair. Neck: No deformities, masses, or tenderness noted. Range of motion &  Thyroid normal. Lungs: Normal respiratory effort; chest expands symmetrically. Lungs are clear to auscultation without rales, wheezes, or increased work of breathing. Heart: Normal rate and rhythm. Normal S1 and S2. No gallop, click, or rub. No murmur. Abdomen: Bowel sounds normal; abdomen soft and nontender. No masses, organomegaly or hernias noted. Genitalia/DRE: Prostate is normal without enlargement, nodularity or induration. There is a small varicocele on the left. Vasectomy scar tissue is present.   .                                                                                   Musculoskeletal/extremities: No deformity or scoliosis noted of  the thoracic or lumbar spine. No clubbing, cyanosis, edema, or deformity noted. Range of motion  normal .Tone & strength  normal.Joints normal. Nail health  good. Vascular: Carotid, radial artery, dorsalis pedis and  posterior tibial pulses are full and equal. No bruits present. Neurologic: Alert and oriented x3. Deep tendon reflexes symmetrical and normal.         Skin: Intact without suspicious lesions or rashes. Lymph: No cervical, axillary, or inguinal lymphadenopathy present. Psych: Mood and affect are normal. Normally interactive  Assessment & Plan:  #1 comprehensive physical exam; no acute findings #2 see Problem List with Assessments & Recommendations.  Problematic is his elevation of LDL in the context of a family history of premature coronary artery disease. Advanced cholesterol testing is indicated to optimally assess risk and indication for intervention.  He also has had mild hyperglycemia; has a family history diabetes. A1c should be checked. Plan: see Orders

## 2011-03-22 NOTE — Patient Instructions (Signed)
Preventive Health Care: Exercise at least 30-45 minutes a day,  3-4 days a week.  Eat a low-fat diet with lots of fruits and vegetables, up to 7-9 servings per day.Consume less than 40 grams of sugar per day from foods & drinks with High Fructose Corn Sugar as #2,3 or # 4 on label. Depression is common in our stressful world.If you're feeling down or losing interest in things you normally enjoy, please call .

## 2011-03-23 ENCOUNTER — Encounter: Payer: Self-pay | Admitting: Internal Medicine

## 2011-03-23 ENCOUNTER — Ambulatory Visit (INDEPENDENT_AMBULATORY_CARE_PROVIDER_SITE_OTHER): Payer: BC Managed Care – PPO | Admitting: *Deleted

## 2011-03-23 DIAGNOSIS — Z23 Encounter for immunization: Secondary | ICD-10-CM

## 2011-03-23 DIAGNOSIS — Z2911 Encounter for prophylactic immunotherapy for respiratory syncytial virus (RSV): Secondary | ICD-10-CM

## 2011-03-26 LAB — NMR LIPOPROFILE WITH LIPIDS
HDL Particle Number: 29.2 umol/L — ABNORMAL LOW (ref >=30.5)
HDL-C: 47 mg/dL (ref >=40)
LDL (calc): 134 mg/dL — ABNORMAL HIGH (ref <100)

## 2012-02-23 ENCOUNTER — Other Ambulatory Visit: Payer: Self-pay | Admitting: Dermatology

## 2013-02-12 ENCOUNTER — Ambulatory Visit (INDEPENDENT_AMBULATORY_CARE_PROVIDER_SITE_OTHER)
Admission: RE | Admit: 2013-02-12 | Discharge: 2013-02-12 | Disposition: A | Discharge status: Home / Self Care | Payer: BC Managed Care – PPO | Source: Ambulatory Visit | Attending: Internal Medicine | Admitting: Internal Medicine

## 2013-02-12 ENCOUNTER — Encounter: Payer: Self-pay | Admitting: Internal Medicine

## 2013-02-12 ENCOUNTER — Ambulatory Visit (INDEPENDENT_AMBULATORY_CARE_PROVIDER_SITE_OTHER): Payer: BC Managed Care – PPO | Admitting: Internal Medicine

## 2013-02-12 VITALS — BP 122/80 | HR 53 | Temp 97.6°F | Wt 214.0 lb

## 2013-02-12 DIAGNOSIS — K219 Gastro-esophageal reflux disease without esophagitis: Secondary | ICD-10-CM

## 2013-02-12 DIAGNOSIS — R9389 Abnormal findings on diagnostic imaging of other specified body structures: Secondary | ICD-10-CM

## 2013-02-12 DIAGNOSIS — C61 Malignant neoplasm of prostate: Secondary | ICD-10-CM | POA: Insufficient documentation

## 2013-02-12 DIAGNOSIS — R7309 Other abnormal glucose: Secondary | ICD-10-CM

## 2013-02-12 DIAGNOSIS — Z87898 Personal history of other specified conditions: Secondary | ICD-10-CM

## 2013-02-12 DIAGNOSIS — E782 Mixed hyperlipidemia: Secondary | ICD-10-CM

## 2013-02-12 LAB — HEPATIC FUNCTION PANEL
ALT: 27 U/L (ref 0–53)
Albumin: 4 g/dL (ref 3.5–5.2)
Alkaline Phosphatase: 43 U/L (ref 39–117)
Bilirubin, Direct: 0.1 mg/dL (ref 0.0–0.3)
Total Protein: 6.9 g/dL (ref 6.0–8.3)

## 2013-02-12 LAB — CBC WITH DIFFERENTIAL/PLATELET
Basophils Relative: 0.8 % (ref 0.0–3.0)
Eosinophils Relative: 1.1 % (ref 0.0–5.0)
HCT: 43.8 % (ref 39.0–52.0)
Hemoglobin: 14.6 g/dL (ref 13.0–17.0)
Lymphocytes Relative: 31.8 % (ref 12.0–46.0)
Lymphs Abs: 1.7 10*3/uL (ref 0.7–4.0)
Monocytes Relative: 8.7 % (ref 3.0–12.0)
Neutro Abs: 3.1 10*3/uL (ref 1.4–7.7)
RBC: 4.86 Mil/uL (ref 4.22–5.81)
RDW: 13.7 % (ref 11.5–14.6)

## 2013-02-12 LAB — BASIC METABOLIC PANEL
CO2: 27 mEq/L (ref 19–32)
Calcium: 9.7 mg/dL (ref 8.4–10.5)
Chloride: 107 mEq/L (ref 96–112)
Glucose, Bld: 108 mg/dL — ABNORMAL HIGH (ref 70–99)
Potassium: 4.2 mEq/L (ref 3.5–5.1)
Sodium: 139 mEq/L (ref 135–145)

## 2013-02-12 LAB — LIPID PANEL
LDL Cholesterol: 127 mg/dL — ABNORMAL HIGH (ref 0–99)
Total CHOL/HDL Ratio: 5
Triglycerides: 142 mg/dL (ref 0.0–149.0)

## 2013-02-12 NOTE — Progress Notes (Signed)
  Subjective:    Patient ID: Peter Novak, male    DOB: [birth year], 62 y.o.   MRN: [id number]  HPI  He's been taking over-the-counter omeprazole for at least 2 years. His symptoms wax and wane; these did improve last week.  Most of symptoms of heartburn occurred in the afternoon and evening.  On rare occasions he has awakened with chest tightness substernally which last 10-15 seconds.  He's also noted some hoarseness and increased burping.  Past medical history was reviewed and updated. He hasn't past history of dyslipidemia. A brother had myocardial infarction at age 19 and sudden death at 63. His paternal grandfather had a heart attack at 35.    Review of Systems  He denies dysphagia, unexplained weight loss, melena, or rectal bleeding.  He is not had exertional chest pain, palpitations, or claudication. He will intermittently have some edema which relates to standing.     Objective:   Physical Exam Gen.: Healthy and well-nourished in appearance. Alert, appropriate and cooperative throughout exam. Eyes: No corneal or conjunctival inflammation noted.  Ears: External  ear exam reveals no significant lesions or deformities. Canals clear .TMs normal. Nose: External nasal exam reveals no deformity or inflammation. Nasal mucosa are pink and moist. No lesions or exudates noted.  Mouth: Oral mucosa and oropharynx reveal no lesions or exudates. Teeth in good repair. Neck: No deformities, masses, or tenderness noted.  Thyroid normal. Lungs: Normal respiratory effort; chest expands symmetrically. Lungs are clear to auscultation without rales, wheezes, or increased work of breathing. Heart:Slow rate and regular rhythm. Normal S1 ;accentuated S2. No gallop, click, or rub.No murmur. Abdomen: Bowel sounds normal; abdomen soft and nontender. No masses, organomegaly or hernias noted. Genitalia: As per Dr Vernie Ammons                                 Musculoskeletal/extremities: No deformity or  scoliosis noted of  the thoracic or lumbar spine.  No clubbing, cyanosis, edema, or significant extremity  deformity noted. Tone & strength  Normal. Joints normal. Nail health good. Able to lie down & sit up w/o help.  Vascular: Carotid, radial artery, dorsalis pedis and  posterior tibial pulses are full and equal. No bruits present. Neurologic: Alert and oriented x3. Deep tendon reflexes symmetrical and normal.       Skin: Intact without suspicious lesions or rashes. Lymph: No cervical, axillary lymphadenopathy present. Psych: Mood and affect are normal. Normally interactive                                                                                        Assessment & Plan:  See Current Assessment & Plan in Problem List under specific Diagnosis

## 2013-02-12 NOTE — Assessment & Plan Note (Signed)
[facility] @ [facility]

## 2013-02-12 NOTE — Assessment & Plan Note (Signed)
Serial values reviewed & velocity of rise discussed; Urology F/U encouraged.

## 2013-02-12 NOTE — Assessment & Plan Note (Signed)
Fasting lipids; LDL goal discussed

## 2013-02-12 NOTE — Patient Instructions (Addendum)
Reflux of gastric acid may be asymptomatic as this may occur mainly during sleep.The triggers for reflux  include stress; the "aspirin family" ; alcohol; peppermint; and caffeine (coffee, tea, cola, and chocolate). The aspirin family would include aspirin and the nonsteroidal agents such as ibuprofen &  Naproxen. Tylenol would not cause reflux. If having symptoms ; food & drink should be avoided for @ least 2 hours before going to bed. Please complete and return stool cards; these will determine whether there is any gastrointestinal bleeding risk. Order for x-rays entered into  the computer; these will be performed at [address] [facility]. across from [facility]. No appointment is necessary. The PSA (prostate cancer screening test) preferred goal is < 2.00, ideally < 1.00. Regardless of the value , it should not rise more than 0.75 units per year.   If you activate the  My Chart system; lab & Xray results will be released directly  to you as soon as I review & address these through the computer. If you choose not to sign up for My Chart within 36 hours of labs being drawn; results will be reviewed & interpretation added before being copied & mailed, causing a delay in getting the results to you.If you do not receive that report within 7-10 days ,please call. Additionally you can use this system to gain direct  access to your records  if  out of town or @ an office of a  physician who is not in  the My Chart network.  This improves continuity of care & places you in control of your medical record.   EKG is normal but there are minor ST-T changes of early repolarization. These are normal variants but could be mistaken for acute injury. This EKG should be available for comparison if  seen emergently.

## 2013-02-12 NOTE — Assessment & Plan Note (Signed)
[facility] -IR value; check A1c.  Father had DM

## 2013-03-06 ENCOUNTER — Telehealth: Payer: Self-pay | Admitting: Internal Medicine

## 2013-03-06 NOTE — Telephone Encounter (Signed)
Pt made aware

## 2013-03-06 NOTE — Telephone Encounter (Signed)
Reviewed chart last PSA done on 03-22-11. Left message to call office.

## 2013-03-06 NOTE — Telephone Encounter (Signed)
Patient is calling to inquire about his most recent PSA result. Please advise.

## 2013-04-22 ENCOUNTER — Other Ambulatory Visit (INDEPENDENT_AMBULATORY_CARE_PROVIDER_SITE_OTHER): Payer: BC Managed Care – PPO

## 2013-04-22 DIAGNOSIS — Z1289 Encounter for screening for malignant neoplasm of other sites: Secondary | ICD-10-CM

## 2013-04-22 DIAGNOSIS — Z0000 Encounter for general adult medical examination without abnormal findings: Secondary | ICD-10-CM

## 2013-04-22 LAB — HEMOCCULT GUIAC POC 1CARD (OFFICE)
Card #2 Fecal Occult Blod, POC: NEGATIVE
Card #3 Fecal Occult Blood, POC: NEGATIVE
Fecal Occult Blood, POC: NEGATIVE

## 2013-07-04 ENCOUNTER — Other Ambulatory Visit: Payer: Self-pay

## 2014-03-24 ENCOUNTER — Encounter: Payer: Self-pay | Admitting: Gastroenterology

## 2014-10-02 ENCOUNTER — Other Ambulatory Visit (HOSPITAL_COMMUNITY)
Admission: RE | Admit: 2014-10-02 | Discharge: 2014-10-02 | Disposition: A | Discharge status: Home / Self Care | Payer: Medicare Other | Source: Ambulatory Visit | Attending: Family Medicine | Admitting: Family Medicine

## 2014-10-02 ENCOUNTER — Ambulatory Visit (INDEPENDENT_AMBULATORY_CARE_PROVIDER_SITE_OTHER): Payer: Medicare Other | Admitting: Family Medicine

## 2014-10-02 ENCOUNTER — Encounter: Payer: Self-pay | Admitting: Family Medicine

## 2014-10-02 VITALS — BP 118/74 | Temp 97.6°F | Wt 216.0 lb

## 2014-10-02 DIAGNOSIS — Z20828 Contact with and (suspected) exposure to other viral communicable diseases: Secondary | ICD-10-CM

## 2014-10-02 DIAGNOSIS — N529 Male erectile dysfunction, unspecified: Secondary | ICD-10-CM | POA: Insufficient documentation

## 2014-10-02 DIAGNOSIS — E782 Mixed hyperlipidemia: Secondary | ICD-10-CM

## 2014-10-02 DIAGNOSIS — N5201 Erectile dysfunction due to arterial insufficiency: Secondary | ICD-10-CM

## 2014-10-02 DIAGNOSIS — G4700 Insomnia, unspecified: Secondary | ICD-10-CM | POA: Insufficient documentation

## 2014-10-02 DIAGNOSIS — M1990 Unspecified osteoarthritis, unspecified site: Secondary | ICD-10-CM | POA: Insufficient documentation

## 2014-10-02 DIAGNOSIS — Z113 Encounter for screening for infections with a predominantly sexual mode of transmission: Secondary | ICD-10-CM | POA: Diagnosis present

## 2014-10-02 DIAGNOSIS — Z7251 High risk heterosexual behavior: Secondary | ICD-10-CM

## 2014-10-02 DIAGNOSIS — F988 Other specified behavioral and emotional disorders with onset usually occurring in childhood and adolescence: Secondary | ICD-10-CM | POA: Insufficient documentation

## 2014-10-02 DIAGNOSIS — Z23 Encounter for immunization: Secondary | ICD-10-CM

## 2014-10-02 DIAGNOSIS — R739 Hyperglycemia, unspecified: Secondary | ICD-10-CM

## 2014-10-02 DIAGNOSIS — H6980 Other specified disorders of Eustachian tube, unspecified ear: Secondary | ICD-10-CM | POA: Insufficient documentation

## 2014-10-02 LAB — COMPREHENSIVE METABOLIC PANEL
ALT: 31 U/L (ref 0–53)
AST: 26 U/L (ref 0–37)
Albumin: 4.3 g/dL (ref 3.5–5.2)
Alkaline Phosphatase: 59 U/L (ref 39–117)
BILIRUBIN TOTAL: 0.7 mg/dL (ref 0.2–1.2)
BUN: 18 mg/dL (ref 6–23)
CALCIUM: 10 mg/dL (ref 8.4–10.5)
CO2: 29 meq/L (ref 19–32)
Chloride: 106 mEq/L (ref 96–112)
Creatinine, Ser: 1.07 mg/dL (ref 0.40–1.50)
GFR: 73.7 mL/min (ref >60.00)
Glucose, Bld: 105 mg/dL — ABNORMAL HIGH (ref 70–99)
Potassium: 4.6 mEq/L (ref 3.5–5.1)
SODIUM: 140 meq/L (ref 135–145)
TOTAL PROTEIN: 7.1 g/dL (ref 6.0–8.3)

## 2014-10-02 LAB — HEMOGLOBIN A1C: HEMOGLOBIN A1C: 6.1 % (ref 4.6–6.5)

## 2014-10-02 MED ORDER — TADALAFIL 20 MG PO TABS: 20.0000 mg | ORAL_TABLET | ORAL | Status: DC | PRN

## 2014-10-02 NOTE — Patient Instructions (Addendum)
STI testing today  Prevnar today  Otherwise no changes  See you in a year for your annual wellness visit

## 2014-10-02 NOTE — Assessment & Plan Note (Signed)
No rx currently.  Discussed patient likely in statin benefit group but he would like to continue healthy habits and recheck risk before making decision. 10 year risk with next labs. Discussed considering asa 81mg  at least once a week if not daily.

## 2014-10-02 NOTE — Assessment & Plan Note (Signed)
Recheck a1c. Advised healthy eating and continued exercise.

## 2014-10-02 NOTE — Progress Notes (Signed)
Peter Reddish, MD Phone: [phone]  Subjective:  Patient presents today to establish care with me as their new primary care provider. Patient was formerly a patient of Dr. Linna Darner. Chief complaint-noted.   Hyperlipidemia-poor control  Lab Results  Component Value Date   LDLCALC 127* 02/12/2013  On statin: no Regular exercise: yes, walks 10-12 mile a week Diet: reasonable ROS- no chest pain or shortness of breath. No myalgias  Concern for STI/exposure to viral disease Married 2006, divorced 2010. Unprotected sex at that time and was never checked for STI.  Dating new girlfriend. No symptoms.  ROS- no penile discharge, no night sweats, unintentional weight loss, no penile or genital lesions.   Hyperglycemia a1c in 2014 of 5.9. Weight largely stable. Does have history DM in father ROS- no polyuria or polydipsia  Erectile dysfunction Trouble obtainingand maintaining erection. Previously took viagra or cialis in the past and both helped. Would like to restart ROS- no penile pain or testicular atrophy  The following were reviewed and entered/updated in epic: Past Medical History  Diagnosis Date  . ADD (attention deficit disorder with hyperactivity)   . Hyperlipidemia   . History of skin cancer     Melanoma X 2   Patient Active Problem List   Diagnosis Date Noted  . ADD (attention deficit disorder) 10/02/2014    Priority: Medium  . Insomnia 10/02/2014    Priority: Medium  . History of elevated PSA 02/12/2013    Priority: Medium  . Hyperglycemia 11/30/2009    Priority: Medium  . History of melanoma 11/30/2009    Priority: Medium  . HYPERLIPIDEMIA 07/10/2007    Priority: Medium  . Erectile dysfunction 10/02/2014    Priority: Low  . Osteoarthritis 10/02/2014    Priority: Low  . Eustachian tube dysfunction 10/02/2014    Priority: Low  . GERD (gastroesophageal reflux disease) 02/12/2013    Priority: Low  . Benign neoplasm of adrenal gland 11/30/2009    Priority:  Low   Past Surgical History  Procedure Laterality Date  . Tonsillectomy and adenoidectomy    . Shoulder arthroscopy  2004    Dr Mayer Camel  . Vasectomy    . Elbow surgery  1994  . Hernia repair  2005  . Cholecystectomy  2005  . Inner ear surgery  2006    Incus transposition;oscillculoplasty; Dr Thornell Mule  . Inner ear surgery  2011    Stirrup resection  . Colonoscopy  2008    negative    Family History  Problem Relation Age of Onset  . Diabetes Father     AAA  . Sudden death Brother 74    CAD; MI @ 48, smoker, sedentary, overweight, diabetes, HLD  . Diabetes Brother   . Hyperlipidemia Brother   . Diverticulosis Mother   . Colon cancer Maternal Aunt   . Heart attack Paternal Grandfather 45    Medications- reviewed and updated Current Outpatient Prescriptions  Medication Sig Dispense Refill  . dextroamphetamine (DEXEDRINE SPANSULE) 10 MG 24 hr capsule Take 10 mg by mouth daily.      [name] Kitchen omeprazole (PRILOSEC) 20 MG capsule Take 1 capsule (20 mg total) by mouth daily. Daily as needed 90 capsule 3  . ALPRAZolam (XANAX) 0.5 MG tablet Take 0.5 mg by mouth. 1/2 -1 by mouth at bedtime     . tadalafil (CIALIS) 20 MG tablet Take 1 tablet (20 mg total) by mouth as needed. one every 3 days as needed (Patient not taking: Reported on 10/02/2014) 10 tablet 3  Allergies-reviewed and updated No Known Allergies  History   Social History  . Marital Status: Married    Spouse Name: N/A    Number of Children: N/A  . Years of Education: N/A   Social History Main Topics  . Smoking status: Never Smoker   . Smokeless tobacco: None  . Alcohol Use: 2.4 oz/week    2 Cans of beer, 2 Glasses of wine per week  . Drug Use: No  . Sexual Activity: None   Other Topics Concern  . None   Social History Narrative   Married 2006, divorced 2010. Currently dating. 2 children (35 and 33 in 2016 in good health), no grandkids.    [employer] grad.       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       Hobbies: place in [place] in [place], outdoor activities, reading, follow things on PC          ROS--See HPI   Objective: BP 118/74 mmHg  Temp(Src) 97.6 F (36.4 C)  Wt 216 lb (97.977 kg) Gen: NAD, resting comfortably HEENT: Mucous membranes are moist. Oropharynx normal. Good dentition CV: RRR no murmurs rubs or gallops Lungs: CTAB no crackles, wheeze, rhonchi Abdomen: soft/nontender/nondistended/normal bowel sounds. No rebound or guarding.  Ext: no edema, 2+ PT pulses Skin: warm, dry Neuro: grossly normal, moves all extremities, PERRLA   Assessment/Plan:  HYPERLIPIDEMIA No rx currently.  Discussed patient likely in statin benefit group but he would like to continue healthy habits and recheck risk before making decision. 10 year risk with next labs. Discussed considering asa 81mg  at least once a week if not daily.     Hyperglycemia Recheck a1c. Advised healthy eating and continued exercise.    Erectile dysfunction New rx for cialis given. Discussed red flags for return. We did discuss increased cardiac risk on stimulant for ADD and if ever symptomatic would need to discontinue use.    Concern for STI/exposure to viral disease Declines GU exam but otherwise standard screening STI as below with addition gonorrhea/chlamydia/trichomonas.   Return precautions advised. Patient plans to follow up in 1 year and will do annual wellness visit at that time. Otherwise prn follow up based on labs. Was not fasting today to check lipids.   Orders Placed This Encounter  Procedures  . Pneumococcal conjugate vaccine 13-valent  . Comprehensive metabolic panel    [place] [facility]  . Hemoglobin A1c    [place]  . HIV antibody    [facility]  . RPR    [facility]    Meds ordered this encounter  Medications  . tadalafil (CIALIS) 20 MG tablet    Sig: Take 1 tablet (20 mg total) by mouth as needed. one every 3 days as needed    Dispense:  10  tablet    Refill:  5

## 2014-10-02 NOTE — Assessment & Plan Note (Signed)
New rx for cialis given. Discussed red flags for return. We did discuss increased cardiac risk on stimulant for ADD and if ever symptomatic would need to discontinue use.

## 2014-10-03 LAB — RPR

## 2014-10-03 LAB — URINE CYTOLOGY ANCILLARY ONLY
Chlamydia: NEGATIVE
Neisseria Gonorrhea: NEGATIVE
Trichomonas: NEGATIVE

## 2014-10-03 LAB — HIV ANTIBODY (ROUTINE TESTING W REFLEX): HIV: NONREACTIVE

## 2014-11-07 ENCOUNTER — Other Ambulatory Visit: Payer: Self-pay | Admitting: Dermatology

## 2015-06-19 ENCOUNTER — Ambulatory Visit (INDEPENDENT_AMBULATORY_CARE_PROVIDER_SITE_OTHER): Payer: Medicare Other | Admitting: Internal Medicine

## 2015-06-19 ENCOUNTER — Encounter: Payer: Self-pay | Admitting: Internal Medicine

## 2015-06-19 VITALS — BP 120/78 | HR 63 | Temp 97.8°F | Wt 218.0 lb

## 2015-06-19 DIAGNOSIS — Z23 Encounter for immunization: Secondary | ICD-10-CM

## 2015-06-19 DIAGNOSIS — R0602 Shortness of breath: Secondary | ICD-10-CM

## 2015-06-19 DIAGNOSIS — K219 Gastro-esophageal reflux disease without esophagitis: Secondary | ICD-10-CM | POA: Diagnosis not present

## 2015-06-19 NOTE — Patient Instructions (Addendum)
Avoids foods high in acid such as tomatoes citrus juices, and spicy foods.  Avoid eating within two hours of lying down or before exercising.  Do not overheat.  Try smaller more frequent meals.  Food Choices for Gastroesophageal Reflux Disease, Adult When you have gastroesophageal reflux disease (GERD), the foods you eat and your eating habits are very important. Choosing the right foods can help ease the discomfort of GERD. WHAT GENERAL GUIDELINES DO I NEED TO FOLLOW?  Choose fruits, vegetables, whole grains, low-fat dairy products, and low-fat meat, fish, and poultry.  Limit fats such as oils, salad dressings, butter, nuts, and avocado.  Keep a food diary to identify foods that cause symptoms.  Avoid foods that cause reflux. These may be different for different people.  Eat frequent small meals instead of three large meals each day.  Eat your meals slowly, in a relaxed setting.  Limit fried foods.  Cook foods using methods other than frying.  Avoid drinking alcohol.  Avoid drinking large amounts of liquids with your meals.  Avoid bending over or lying down until 2-3 hours after eating. WHAT FOODS ARE NOT RECOMMENDED? The following are some foods and drinks that may worsen your symptoms: Vegetables Tomatoes. Tomato juice. Tomato and spaghetti sauce. Chili peppers. Onion and garlic. Horseradish. Fruits Oranges, grapefruit, and lemon (fruit and juice). Meats High-fat meats, fish, and poultry. This includes hot dogs, ribs, ham, sausage, salami, and bacon. Dairy Whole milk and chocolate milk. Sour cream. Cream. Butter. Ice cream. Cream cheese.  Beverages Coffee and tea, with or without caffeine. Carbonated beverages or energy drinks. Condiments Hot sauce. Barbecue sauce.  Sweets/Desserts Chocolate and cocoa. Donuts. Peppermint and spearmint. Fats and Oils High-fat foods, including [place] fries and potato chips. Other Vinegar. Strong spices, such as black pepper, white  pepper, red pepper, cayenne, curry powder, cloves, ginger, and chili powder. The items listed above may not be a complete list of foods and beverages to avoid. Contact your dietitian for more information.   This information is not intended to replace advice given to you by your health care provider. Make sure you discuss any questions you have with your health care provider.   Document Released: 08/15/2005 Document Revised: 09/05/2014 Document Reviewed: 06/19/2013 Elsevier Interactive Patient Education 2016 Elsevier Inc.   Omeprazole twice daily  Call or return to clinic prn if these symptoms worsen or fail to improve as anticipated.  Will refer for upper endoscopy if unimproved

## 2015-06-19 NOTE — Progress Notes (Signed)
Cousin passed away last week. 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 Was not a smoker. counsins sister with GERD and has had ulcer before.   prilosec for 10 years and it helps and at times will take pepcid instant at times. Seems worse over last month.   Burning in chest up to neck worse after meals.   More winded with activity at times. No chest pain, diaphoresis. Past month or so Activity hasnt changed much since 2014  [facility]- biopsy

## 2015-06-19 NOTE — Progress Notes (Signed)
Subjective:    Patient ID: Peter Novak, male    DOB: 02/01/1951, 64 y.o.   MRN: [id number]  HPI  Wt Readings from Last 3 Encounters:  06/19/15 218 lb (98.884 kg)  10/02/14 216 lb (97.977 kg)  02/12/13 214 lb (97.58 kg)   64 year old patient who has a long history of gastro-soft, reflux disease.  He has been on maintenance omeprazole 20 mg daily.  For the past several days he has had worsening reflux symptoms.  He has taken additional Tums and Pepcid. He was concerned about his worsening symptoms-he is aware of an acquaintance who was recently diagnosed with esophageal cancer Patient has no weight loss, dysphagia, or other complaints. He admits that his diet could be improved  Past Medical History  Diagnosis Date  . ADD (attention deficit disorder with hyperactivity)   . Hyperlipidemia   . History of skin cancer     Melanoma X 2    Social History   Social History  . Marital Status: Married    Spouse Name: N/A  . Number of Children: N/A  . Years of Education: N/A   Occupational History  . Not on file.   Social History Main Topics  . Smoking status: Never Smoker   . Smokeless tobacco: Not on file  . Alcohol Use: 2.4 oz/week    2 Cans of beer, 2 Glasses of wine per week  . Drug Use: No  . Sexual Activity: Not on file   Other Topics Concern  . Not on file   Social History Narrative   Married 2006, divorced 2010. Currently dating. 2 children (35 and 33 in 2016 in good health), no grandkids.    [employer] grad.       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       Hobbies: place in [place] in [place], outdoor activities, reading, follow things on PC          Past Surgical History  Procedure Laterality Date  . Tonsillectomy and adenoidectomy    . Shoulder arthroscopy  2004    Dr Mayer Camel  . Vasectomy    . Elbow surgery  1994  . Hernia repair  2005  . Cholecystectomy  2005  . Inner ear surgery  2006    Incus transposition;oscillculoplasty; Dr Thornell Mule  . Inner ear surgery  2011    Stirrup resection  . Colonoscopy  2008    negative    Family History  Problem Relation Age of Onset  . Diabetes Father     AAA  . Sudden death Brother 67    CAD; MI @ 56, smoker, sedentary, overweight, diabetes, HLD  . Diabetes Brother   . Hyperlipidemia Brother   . Diverticulosis Mother   . Colon cancer Maternal Aunt   . Heart attack Paternal Grandfather 45    No Known Allergies  Current Outpatient Prescriptions on File Prior to Visit  Medication Sig Dispense Refill  . ALPRAZolam (XANAX) 0.5 MG tablet Take 0.5 mg by mouth. 1/2 -1 by mouth at bedtime     . dextroamphetamine (DEXEDRINE SPANSULE) 10 MG 24 hr capsule Take 10 mg by mouth daily.      [name] Kitchen omeprazole (PRILOSEC) 20 MG capsule Take 1 capsule (20 mg total) by mouth daily. Daily as needed 90 capsule 3  . tadalafil (CIALIS) 20 MG tablet Take 1 tablet (20 mg total) by mouth as needed. one every 3 days as needed 10 tablet 5  No current facility-administered medications on file prior to visit.    BP 120/78 mmHg  Pulse 63  Temp(Src) 97.8 F (36.6 C)  Wt 218 lb (98.884 kg)  SpO2 97%      Review of Systems  Constitutional: Negative for fever, chills, appetite change and fatigue.  HENT: Negative for congestion, dental problem, ear pain, hearing loss, sore throat, tinnitus, trouble swallowing and voice change.   Eyes: Negative for pain, discharge and visual disturbance.  Respiratory: Negative for cough, chest tightness, wheezing and stridor.   Cardiovascular: Negative for chest pain, palpitations and leg swelling.  Gastrointestinal: Positive for abdominal pain. Negative for nausea, vomiting, diarrhea, constipation, blood in stool and abdominal distention.  Genitourinary: Negative for urgency, hematuria, flank pain, discharge, difficulty urinating and genital sores.  Musculoskeletal: Negative for myalgias, back pain, joint swelling,  arthralgias, gait problem and neck stiffness.  Skin: Negative for rash.  Neurological: Negative for dizziness, syncope, speech difficulty, weakness, numbness and headaches.  Hematological: Negative for adenopathy. Does not bruise/bleed easily.  Psychiatric/Behavioral: Negative for behavioral problems and dysphoric mood. The patient is not nervous/anxious.        Objective:   Physical Exam  Constitutional: He is oriented to person, place, and time. He appears well-developed.  HENT:  Head: Normocephalic.  Right Ear: External ear normal.  Left Ear: External ear normal.  Eyes: Conjunctivae and EOM are normal.  Neck: Normal range of motion.  Cardiovascular: Normal rate and normal heart sounds.   Pulmonary/Chest: Breath sounds normal.  Abdominal: Soft. Bowel sounds are normal. He exhibits no distension. There is no tenderness. There is no rebound and no guarding.  Musculoskeletal: Normal range of motion. He exhibits no edema or tenderness.  Neurological: He is alert and oriented to person, place, and time.  Psychiatric: He has a normal mood and affect. His behavior is normal.          Assessment & Plan:   Gastroesophageal reflux disease.  Will start on a more aggressive antireflux regimen.  The short-term will increase omeprazole to a twice a day regimen.  Will improve his antireflux diet.  Dietary instructions dispensed.  Will observe over the weekend; will consider referral for EGD if patient does not improve promptly on an aggressive antireflux regimen

## 2015-08-06 LAB — PSA: PSA: 6.9

## 2015-09-30 ENCOUNTER — Encounter (HOSPITAL_COMMUNITY): Payer: Self-pay

## 2015-09-30 ENCOUNTER — Emergency Department (HOSPITAL_COMMUNITY)
Admission: EM | Admit: 2015-09-30 | Discharge: 2015-09-30 | Disposition: A | Discharge status: Home / Self Care | Payer: Medicare Other | Attending: Emergency Medicine | Admitting: Emergency Medicine

## 2015-09-30 DIAGNOSIS — Z8639 Personal history of other endocrine, nutritional and metabolic disease: Secondary | ICD-10-CM | POA: Diagnosis not present

## 2015-09-30 DIAGNOSIS — S61216A Laceration without foreign body of right little finger without damage to nail, initial encounter: Secondary | ICD-10-CM | POA: Insufficient documentation

## 2015-09-30 DIAGNOSIS — Z79899 Other long term (current) drug therapy: Secondary | ICD-10-CM | POA: Diagnosis not present

## 2015-09-30 DIAGNOSIS — Z23 Encounter for immunization: Secondary | ICD-10-CM | POA: Diagnosis not present

## 2015-09-30 DIAGNOSIS — F909 Attention-deficit hyperactivity disorder, unspecified type: Secondary | ICD-10-CM | POA: Insufficient documentation

## 2015-09-30 DIAGNOSIS — Y998 Other external cause status: Secondary | ICD-10-CM | POA: Diagnosis not present

## 2015-09-30 DIAGNOSIS — Z85820 Personal history of malignant melanoma of skin: Secondary | ICD-10-CM | POA: Insufficient documentation

## 2015-09-30 DIAGNOSIS — S61219A Laceration without foreign body of unspecified finger without damage to nail, initial encounter: Secondary | ICD-10-CM

## 2015-09-30 DIAGNOSIS — Y9389 Activity, other specified: Secondary | ICD-10-CM | POA: Diagnosis not present

## 2015-09-30 DIAGNOSIS — W260XXA Contact with knife, initial encounter: Secondary | ICD-10-CM | POA: Diagnosis not present

## 2015-09-30 DIAGNOSIS — Y9289 Other specified places as the place of occurrence of the external cause: Secondary | ICD-10-CM | POA: Insufficient documentation

## 2015-09-30 MED ORDER — TETANUS-DIPHTH-ACELL PERTUSSIS 5-2.5-18.5 LF-MCG/0.5 IM SUSP
0.5000 mL | Freq: Once | INTRAMUSCULAR | Status: AC
  Administered 2015-09-30: 0.5 mL via INTRAMUSCULAR
  Filled 2015-09-30: qty 0.5

## 2015-09-30 MED ORDER — LIDOCAINE HCL 1 % IJ SOLN
INTRAMUSCULAR | Status: AC
  Administered 2015-09-30: 20 mL
  Filled 2015-09-30: qty 20

## 2015-09-30 NOTE — Discharge Instructions (Signed)
Please keep your wound clean and dry. You may use Neosporin to help with the scar. Follow-up with your doctor in 1 week for a wound recheck. Return to ED or your PCP in 2 weeks for suture removal. Return sooner for any new or worsening symptoms as we discussed.  Laceration Care, Adult A laceration is a cut that goes through all of the layers of the skin and into the tissue that is right under the skin. Some lacerations heal on their own. Others need to be closed with stitches (sutures), staples, skin adhesive strips, or skin glue. Proper laceration care minimizes the risk of infection and helps the laceration to heal better. HOW TO CARE FOR YOUR LACERATION If sutures or staples were used:  Keep the wound clean and dry.  If you were given a bandage (dressing), you should change it at least one time per day or as told by your health care provider. You should also change it if it becomes wet or dirty.  Keep the wound completely dry for the first 24 hours or as told by your health care provider. After that time, you may shower or bathe. However, make sure that the wound is not soaked in water until after the sutures or staples have been removed.  Clean the wound one time each day or as told by your health care provider:  Wash the wound with soap and water.  Rinse the wound with water to remove all soap.  Pat the wound dry with a clean towel. Do not rub the wound.  After cleaning the wound, apply a thin layer of antibiotic ointmentas told by your health care provider. This will help to prevent infection and keep the dressing from sticking to the wound.  Have the sutures or staples removed as told by your health care provider. If skin adhesive strips were used:  Keep the wound clean and dry.  If you were given a bandage (dressing), you should change it at least one time per day or as told by your health care provider. You should also change it if it becomes dirty or wet.  Do not get the skin  adhesive strips wet. You may shower or bathe, but be careful to keep the wound dry.  If the wound gets wet, pat it dry with a clean towel. Do not rub the wound.  Skin adhesive strips fall off on their own. You may trim the strips as the wound heals. Do not remove skin adhesive strips that are still stuck to the wound. They will fall off in time. If skin glue was used:  Try to keep the wound dry, but you may briefly wet it in the shower or bath. Do not soak the wound in water, such as by swimming.  After you have showered or bathed, gently pat the wound dry with a clean towel. Do not rub the wound.  Do not do any activities that will make you sweat heavily until the skin glue has fallen off on its own.  Do not apply liquid, cream, or ointment medicine to the wound while the skin glue is in place. Using those may loosen the film before the wound has healed.  If you were given a bandage (dressing), you should change it at least one time per day or as told by your health care provider. You should also change it if it becomes dirty or wet.  If a dressing is placed over the wound, be careful not to apply tape  directly over the skin glue. Doing that may cause the glue to be pulled off before the wound has healed.  Do not pick at the glue. The skin glue usually remains in place for 5-10 days, then it falls off of the skin. General Instructions  Take over-the-counter and prescription medicines only as told by your health care provider.  If you were prescribed an antibiotic medicine or ointment, take or apply it as told by your doctor. Do not stop using it even if your condition improves.  To help prevent scarring, make sure to cover your wound with sunscreen whenever you are outside after stitches are removed, after adhesive strips are removed, or when glue remains in place and the wound is healed. Make sure to wear a sunscreen of at least 30 SPF.  Do not scratch or pick at the wound.  Keep all  follow-up visits as told by your health care provider. This is important.  Check your wound every day for signs of infection. Watch for:  Redness, swelling, or pain.  Fluid, blood, or pus.  Raise (elevate) the injured area above the level of your heart while you are sitting or lying down, if possible. SEEK MEDICAL CARE IF:  You received a tetanus shot and you have swelling, severe pain, redness, or bleeding at the injection site.  You have a fever.  A wound that was closed breaks open.  You notice a bad smell coming from your wound or your dressing.  You notice something coming out of the wound, such as wood or glass.  Your pain is not controlled with medicine.  You have increased redness, swelling, or pain at the site of your wound.  You have fluid, blood, or pus coming from your wound.  You notice a change in the color of your skin near your wound.  You need to change the dressing frequently due to fluid, blood, or pus draining from the wound.  You develop a new rash.  You develop numbness around the wound. SEEK IMMEDIATE MEDICAL CARE IF:  You develop severe swelling around the wound.  Your pain suddenly increases and is severe.  You develop painful lumps near the wound or on skin that is anywhere on your body.  You have a red streak going away from your wound.  The wound is on your hand or foot and you cannot properly move a finger or toe.  The wound is on your hand or foot and you notice that your fingers or toes look pale or bluish.   This information is not intended to replace advice given to you by your health care provider. Make sure you discuss any questions you have with your health care provider.   Document Released: 08/15/2005 Document Revised: 12/30/2014 Document Reviewed: 08/11/2014 Elsevier Interactive Patient Education [employer].

## 2015-09-30 NOTE — ED Provider Notes (Signed)
CSN: [id number]     Arrival date & time 09/30/15  [id number] History   First MD Initiated Contact with Patient 09/30/15 1003     Chief Complaint  Patient presents with  . Finger Laceration      (Consider location/radiation/quality/duration/timing/severity/associated sxs/prior Treatment) HPI Peter Novak is a 65 y.o. male who comes in for evaluation of finger laceration. Patient reports he was using a pocket knife earlier this morning when his right pinky finger got caught in the blade. He is unsure of his last tetanus shot. He reports taking one 325 mg aspirin today, but denies any other anticoagulation. Denies any numbness or weakness, decreased range of motion. Reports pain is mild. Nothing seems to make her problem better or worse. No other modifying factors.  Past Medical History  Diagnosis Date  . ADD (attention deficit disorder with hyperactivity)   . Hyperlipidemia   . History of skin cancer     Melanoma X 2   Past Surgical History  Procedure Laterality Date  . Tonsillectomy and adenoidectomy    . Shoulder arthroscopy  2004    Dr Mayer Camel  . Vasectomy    . Elbow surgery  1994  . Hernia repair  2005  . Cholecystectomy  2005  . Inner ear surgery  2006    Incus transposition;oscillculoplasty; Dr Thornell Mule  . Inner ear surgery  2011    Stirrup resection  . Colonoscopy  2008    negative   Family History  Problem Relation Age of Onset  . Diabetes Father     AAA  . Sudden death Brother 59    CAD; MI @ 42, smoker, sedentary, overweight, diabetes, HLD  . Diabetes Brother   . Hyperlipidemia Brother   . Diverticulosis Mother   . Colon cancer Maternal Aunt   . Heart attack Paternal Grandfather 8   Social History  Substance Use Topics  . Smoking status: Never Smoker   . Smokeless tobacco: None  . Alcohol Use: 2.4 oz/week    2 Cans of beer, 2 Glasses of wine per week    Review of Systems A 10 point review of systems was completed and was negative except for pertinent positives  and negatives as mentioned in the history of present illness     Allergies  Review of patient's allergies indicates no known allergies.  Home Medications   Prior to Admission medications   Medication Sig Start Date End Date Taking? Authorizing Provider  ALPRAZolam Duanne Moron) 0.5 MG tablet Take 0.5 mg by mouth. 1/2 -1 by mouth at bedtime     Historical Provider, MD  dextroamphetamine (DEXEDRINE SPANSULE) 10 MG 24 hr capsule Take 10 mg by mouth daily.      Historical Provider, MD  omeprazole (PRILOSEC) 20 MG capsule Take 1 capsule (20 mg total) by mouth daily. Daily as needed 03/22/11   Hendricks Limes, MD  tadalafil (CIALIS) 20 MG tablet Take 1 tablet (20 mg total) by mouth as needed. one every 3 days as needed 10/02/14   Marin Olp, MD   BP 126/80 mmHg  Pulse 62  Temp(Src) 98.1 F (36.7 C) (Oral)  Resp 16  SpO2 97% Physical Exam  Constitutional:  Awake, alert, nontoxic appearance.  HENT:  Head: Atraumatic.  Eyes: Right eye exhibits no discharge. Left eye exhibits no discharge.  Neck: Neck supple.  Pulmonary/Chest: Effort normal. He exhibits no tenderness.  Abdominal: Soft. There is no tenderness. There is no rebound.  Musculoskeletal: He exhibits no tenderness.  Baseline ROM,  no obvious new focal weakness.  Neurological:  Mental status and motor strength appears baseline for patient and situation.  Skin: No rash noted.  Right pinky: Longitudinal laceration that runs the length of the finger originates on the palmar aspect of the middle phalanx and extends to the tip of the finger. Exploration of the wound shows no bony or tendinous involvement. Patient is able to flex and extend the digit against resistance. Brisk cap refill  Psychiatric: He has a normal mood and affect.  Nursing note and vitals reviewed.   ED Course  Procedures (including critical care time) Labs Review Labs Reviewed - No data to display  Imaging Review No results found. I have personally reviewed  and evaluated these images and lab results as part of my medical decision-making.   EKG Interpretation None     NERVE BLOCK Performed by: Verl Dicker Consent: Verbal consent obtained. Required items: required blood products, implants, devices, and special equipment available Time out: Immediately prior to procedure a "time out" was called to verify the correct patient, procedure, equipment, support staff and site/side marked as required.  Indication: Laceration  Nerve block body site: Right pinky finger   Preparation: Patient was prepped and draped in the usual sterile fashion. Needle gauge: 24 G Location technique: anatomical landmarks  Local anesthetic: Lidocaine 1%   Anesthetic total: 2 ml  Outcome: pain improved Patient tolerance: Patient tolerated the procedure well with no immediate complications.  LACERATION REPAIR Performed by: Verl Dicker Authorized by: Verl Dicker Consent: Verbal consent obtained. Risks and benefits: risks, benefits and alternatives were discussed Consent given by: patient Patient identity confirmed: provided demographic data Prepped and Draped in normal sterile fashion Wound explored  Laceration Location: Right pinky finger  Laceration Length: 4 cm  No Foreign Bodies seen or palpated  Anesthesia: local infiltration  Local anesthetic: lidocaine 1 % without epinephrine  Anesthetic total: 2 ml  Irrigation method: syringe Amount of cleaning: standard  Skin closure: 4-0 Prolene   Number of sutures: 6   Technique: Simple interrupted   Patient tolerance: Patient tolerated the procedure well with no immediate complications.  Meds given in ED:  Medications  lidocaine (XYLOCAINE) 1 % (with pres) injection (20 mLs  Given 09/30/15 1030)  Tdap (BOOSTRIX) injection 0.5 mL (0.5 mLs Intramuscular Given 09/30/15 1028)    Discharge Medication List as of 09/30/2015 11:00 AM     Filed Vitals:   09/30/15 0946  BP: 126/80   Pulse: 62  Temp: 98.1 F (36.7 C)  TempSrc: Oral  Resp: 16  SpO2: 97%     MDM  Tdap booster given.Pressure irrigation performed. Laceration occurred < 8 hours prior to repair which was well tolerated. Pt has no co morbidities to effect normal wound healing. Discussed suture home care w pt and answered questions. Pt to f-u for wound check with PCP in 7 days and suture removal in 14 days. Discussed return precautions including fever, increased redness or swelling, cloudy discharge, painful range of motion. Pt is hemodynamically stable w no complaints prior to dc.    The patient appears reasonably screened and/or stabilized for discharge and I doubt any other medical condition or other [facility] requiring further screening, evaluation, or treatment in the ED at this time prior to discharge.    Final diagnoses:  Finger laceration, initial encounter        Comer Locket, PA-C 09/30/15 [place], MD 09/30/15 [phone]

## 2015-09-30 NOTE — ED Notes (Signed)
Pt presents w/ laceration to R pinky finger.  Pain score 4/10.  Pt reports that he was pulling a pocket knife out of his pocket and lacerated finger.  Denies blood thinners.  Full ROM noted.  Unknown last tetanus.

## 2015-10-09 ENCOUNTER — Telehealth: Payer: Self-pay | Admitting: Family Medicine

## 2015-10-09 NOTE — Telephone Encounter (Signed)
Pt has 6 stitches in right pinky and they need to be removed on Feb 15, 16 or 17th. (pt states knife cut) Do you want to work this pt in a 15 min same day on one of these days, or put with someone else? That is all you have! Pt states he is open

## 2015-10-09 NOTE — Telephone Encounter (Signed)
Create slot- 8 am on 15th and have him show up at least 10 minutes early.

## 2015-10-12 NOTE — Telephone Encounter (Signed)
Pt has been scheduled.  °

## 2015-10-14 ENCOUNTER — Encounter: Payer: Self-pay | Admitting: Family Medicine

## 2015-10-14 ENCOUNTER — Ambulatory Visit (INDEPENDENT_AMBULATORY_CARE_PROVIDER_SITE_OTHER): Payer: Medicare Other | Admitting: Family Medicine

## 2015-10-14 VITALS — BP 124/80 | Temp 98.1°F | Wt 232.0 lb

## 2015-10-14 DIAGNOSIS — Z4802 Encounter for removal of sutures: Secondary | ICD-10-CM | POA: Diagnosis not present

## 2015-10-14 DIAGNOSIS — R42 Dizziness and giddiness: Secondary | ICD-10-CM | POA: Diagnosis not present

## 2015-10-14 DIAGNOSIS — S61219A Laceration without foreign body of unspecified finger without damage to nail, initial encounter: Secondary | ICD-10-CM

## 2015-10-14 DIAGNOSIS — Z23 Encounter for immunization: Secondary | ICD-10-CM

## 2015-10-14 NOTE — Patient Instructions (Addendum)
Final pneumonia ([id number]) received today.  I would keep area covered until skin is fully closed may take another few weeks due to hand movement.   For dizziness, reassuring exam today. Could be coming from the ear and prior procedure- consider following up with ENT if persists or worsens

## 2015-10-14 NOTE — Progress Notes (Signed)
Garret Reddish, MD  Subjective:  Peter Novak is a 65 y.o. year old very pleasant male patient who presents for/with See problem oriented charting ROS- no finger weakness. Some continued finger pain. No facial or extremity weakness. No slurred words or trouble swallowing. no blurry vision or double vision. No paresthesias. No confusion or word finding difficulties.   Past Medical History-  Patient Active Problem List   Diagnosis Date Noted  . ADD (attention deficit disorder) 10/02/2014    Priority: Medium  . Insomnia 10/02/2014    Priority: Medium  . History of elevated PSA 02/12/2013    Priority: Medium  . Hyperglycemia 11/30/2009    Priority: Medium  . History of melanoma 11/30/2009    Priority: Medium  . HYPERLIPIDEMIA 07/10/2007    Priority: Medium  . Erectile dysfunction 10/02/2014    Priority: Low  . Osteoarthritis 10/02/2014    Priority: Low  . Eustachian tube dysfunction 10/02/2014    Priority: Low  . GERD (gastroesophageal reflux disease) 02/12/2013    Priority: Low  . Benign neoplasm of adrenal gland 11/30/2009    Priority: Low    Medications- reviewed and updated Current Outpatient Prescriptions  Medication Sig Dispense Refill  . dextroamphetamine (DEXEDRINE SPANSULE) 10 MG 24 hr capsule Take 10 mg by mouth daily.      [name] Kitchen omeprazole (PRILOSEC) 20 MG capsule Take 1 capsule (20 mg total) by mouth daily. Daily as needed 90 capsule 3  . tadalafil (CIALIS) 20 MG tablet Take 1 tablet (20 mg total) by mouth as needed. one every 3 days as needed 10 tablet 5  . ALPRAZolam (XANAX) 0.5 MG tablet Take 0.5 mg by mouth. Reported on 10/14/2015     No current facility-administered medications for this visit.    Objective: BP 124/80 mmHg  Temp(Src) 98.1 F (36.7 C)  Wt 232 lb (105.235 kg) Gen: NAD, resting comfortably CV: RRR no murmurs rubs or gallops Lungs: CTAB no crackles, wheeze, rhonchi Ext: no edema in arm or pretibial- does have longitudinal laceration on  palmar side of pinky finger on right hand extending proximally past DIP joint.  Skin: warm, dry, brisk capillary refill Neuro: see below. Also normal strength in finger compared to left.   Assessment/Plan: Dizziness S: While removing sutures, patient mentions dizziness 1-2x a month. 5 minutes in morning. No cva symptoms. Not usually vertiginous. Over last few months. Not increasing in frequency. No treatments tried. Goes away on its own. History of inner ear surgery with Dr. Otis Dials: right TM with opacification-appears similar to baseline.  Neuro: CN II-XII intact, sensation and reflexes normal throughout, 5/5 muscle strength in bilateral upper and lower extremities. Normal finger to nose. Normal rapid alternating movements. No pronator drift. Normal romberg. Normal gait.  A/P: unclear etiology, could be related to history of inner ear surgery. My plan was to refer back to ENT but patient states he will call on his own. Discussed with neuro exam unremarkable doubt mass and also doubt cva or recurrent TIA.   Finger laceration, right pinky (new to this office) S: occurred on 09/30/15 with pocketknife. Went to ER, 6 stitches applied to right pinky finger. No bony, tender, or nerve damage reported.  A/P: 6 stitches removed today after cleansing.  appears to be healing reasonably well- some separation at pulp of thumb. Advised to keep covered while healing and may take another few weeks to heal  Return precautions advised.   Orders Placed This Encounter  Procedures  . Pneumococcal polysaccharide vaccine 23-valent  greater than or equal to 2yo subcutaneous/IM

## 2015-11-20 ENCOUNTER — Other Ambulatory Visit: Payer: Self-pay | Admitting: Otolaryngology

## 2015-11-20 DIAGNOSIS — H903 Sensorineural hearing loss, bilateral: Secondary | ICD-10-CM

## 2015-11-20 DIAGNOSIS — R270 Ataxia, unspecified: Secondary | ICD-10-CM

## 2015-12-07 ENCOUNTER — Ambulatory Visit
Admission: RE | Admit: 2015-12-07 | Discharge: 2015-12-07 | Disposition: A | Discharge status: Home / Self Care | Payer: Medicare Other | Source: Ambulatory Visit | Attending: Otolaryngology | Admitting: Otolaryngology

## 2015-12-07 DIAGNOSIS — R270 Ataxia, unspecified: Secondary | ICD-10-CM

## 2015-12-07 DIAGNOSIS — H903 Sensorineural hearing loss, bilateral: Secondary | ICD-10-CM

## 2015-12-16 ENCOUNTER — Ambulatory Visit
Admission: RE | Admit: 2015-12-16 | Discharge: 2015-12-16 | Disposition: A | Discharge status: Home / Self Care | Payer: Medicare Other | Source: Ambulatory Visit | Attending: Otolaryngology | Admitting: Otolaryngology

## 2015-12-16 MED ORDER — GADOBENATE DIMEGLUMINE 529 MG/ML IV SOLN
20.0000 mL | Freq: Once | INTRAVENOUS | Status: AC | PRN
  Administered 2015-12-16: 20 mL via INTRAVENOUS

## 2016-01-06 ENCOUNTER — Ambulatory Visit (INDEPENDENT_AMBULATORY_CARE_PROVIDER_SITE_OTHER): Payer: Medicare Other | Admitting: Neurology

## 2016-01-06 ENCOUNTER — Encounter: Payer: Self-pay | Admitting: Neurology

## 2016-01-06 VITALS — BP 156/92 | HR 57 | Ht 74.0 in | Wt 228.0 lb

## 2016-01-06 DIAGNOSIS — R930 Abnormal findings on diagnostic imaging of skull and head, not elsewhere classified: Secondary | ICD-10-CM | POA: Diagnosis not present

## 2016-01-06 DIAGNOSIS — R2689 Other abnormalities of gait and mobility: Secondary | ICD-10-CM | POA: Diagnosis not present

## 2016-01-06 DIAGNOSIS — R7302 Impaired glucose tolerance (oral): Secondary | ICD-10-CM

## 2016-01-06 DIAGNOSIS — E785 Hyperlipidemia, unspecified: Secondary | ICD-10-CM

## 2016-01-06 DIAGNOSIS — R9082 White matter disease, unspecified: Secondary | ICD-10-CM

## 2016-01-06 DIAGNOSIS — R42 Dizziness and giddiness: Secondary | ICD-10-CM

## 2016-01-06 NOTE — Progress Notes (Signed)
[facility]    Provider:  Dr Jaynee Eagles Referring Provider: Dr. Vicie Mutters Primary Care Physician:  Garret Reddish, MD  CC:  ataxia  HPI:  Peter Novak is a 65 y.o. male here as a referral from Dr. Yong Channel for ataxia, dizziness, changes of visual perception. 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 . He denies HTN, Diabetes, HLD, smoking, alcohol use or other vascular risk factors. He had some dizziness earlier this year in March, within 15 minutes of waking up he would have some leaning to one side, spinning feeling, he would have some imbalance on walking which would resolve. It happened for several days otherwise nothing else happened. Had several episodes. The end of April he had it for 3 days continuously, dizziness, lightheaded, no room spinning, no CP, no SOB, no weakness, no other focal neurologic deficits. No episodes since April. No FHx of autoimmune disorders, parents died in their 75s, he is generally healthy. No inciting events, no head trauma or previous illnesses. No travel overseas. Nothing made the symptoms worse or better. No history of neurologic symptoms. No family history of autoimmune disorders, neurodegenerative, diseases. No other focal neurologic deficits, no dysarthria, no dysphagia, no aphasia, no vision changes, no weakness or sensory changes. Currently he denies no gait abnormalities. He has had worsening left hearing changes with fullness in the ear. Fullness in the ear feels better with Valsalva. Denies headaches, no family history of personal history of migraines.  Reviewed notes, labs and imaging from outside physicians, which showed: Reviewed notes by Dr. Hermina Barters. Seen in the office complaining of dizziness as a rotation torque sensation. Symptoms began 3-4 months ago spells in early March. Spells  occurred in the morning or in the afternoon. Wobbly. No nausea or vomiting. He does describe decreased hearing in the left ear with a feeling of constant fullness. Feels better with Valsalva. He has a water-like feeling in both ears but no fluid draining out of either ear. Denied headaches, migraine disease. Exam reviewed patient's right tympanic membrane is stable, no middle ear effusion, no sign of infection, implant appears in place, asymmetric high frequency sensorineural hearing loss, right ear greater than left. 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  MRI of the brain 12/16/2015 (personally reviewed images and agree with the following) Normal and symmetric labyrinthine signal. No thickening or enhancement along the vestibular cochlear nerves to suggest schwannoma. No enlarged endolymphatic sac. No specific explanation for ataxia - the cerebellum and brainstem has normal size and signal. Normal appearance of the vertebral and basilar arteries.  Extensive for age white matter disease in the cerebral hemispheres with a frontal predominance where there is developing confluent signal abnormality. The pattern is nonspecific but usually from chronic microvascular disease at this age. There is no specific demyelinating pattern.  IMPRESSION: 1. No explanation for the provided history. 2. Moderate white matter disease, nonspecific pattern but usually from chronic microvascular ischemia.   Review of Systems: Patient complains of symptoms per HPI as well as the following symptoms: ringing in ears, spinning sensation, dizziness . Pertinent negatives per HPI. All others negative.   Social History   Social History  . Marital Status: Married    Spouse Name: N/A  . Number of Children: N/A  . Years of Education: N/A   Occupational History  . Not on file.  Social History Main Topics  . Smoking status: Never Smoker   . Smokeless tobacco: Not on file  .  Alcohol Use: 2.4 oz/week    2 Cans of beer, 2 Glasses of wine per week  . Drug Use: No  . Sexual Activity: Not on file   Other Topics Concern  . Not on file   Social History Narrative   Married 2006, divorced 2010. Currently dating. 2 children (35 and 33 in 2016 in good health), no grandkids.    [employer] grad.       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       Hobbies: place in [place] in [place], outdoor activities, reading, follow things on PC          Family History  Problem Relation Age of Onset  . Diabetes Father     AAA  . Sudden death Brother 43    CAD; MI @ 70, smoker, sedentary, overweight, diabetes, HLD  . Diabetes Brother   . Hyperlipidemia Brother   . Diverticulosis Mother   . Colon cancer Maternal Aunt   . Heart attack Paternal Grandfather 56  . Stroke Neg Hx   . Neuropathy Neg Hx   . Multiple sclerosis Neg Hx   . Migraines Neg Hx   . Dementia Neg Hx     Past Medical History  Diagnosis Date  . ADD (attention deficit disorder with hyperactivity)   . Hyperlipidemia   . History of skin cancer     Melanoma X 2  . Hearing loss     Past Surgical History  Procedure Laterality Date  . Tonsillectomy and adenoidectomy    . Shoulder arthroscopy  2004    Dr Mayer Camel  . Vasectomy    . Elbow surgery  1994  . Hernia repair  2005  . Cholecystectomy  2005  . Inner ear surgery  2006    Incus transposition;oscillculoplasty; Dr Thornell Mule  . Inner ear surgery  2011    Stirrup resection  . Colonoscopy  2008    negative    Current Outpatient Prescriptions  Medication Sig Dispense Refill  . ALPRAZolam (XANAX) 0.5 MG tablet Take 0.5 mg by mouth. Reported on 10/14/2015    . dextroamphetamine (DEXEDRINE SPANSULE) 10 MG 24 hr capsule Take 10 mg by mouth daily.      [name] Kitchen omeprazole (PRILOSEC) 20 MG capsule Take 1 capsule (20 mg total) by mouth daily. Daily as needed 90 capsule 3  . tadalafil (CIALIS) 20 MG tablet  Take 1 tablet (20 mg total) by mouth as needed. one every 3 days as needed 10 tablet 5   No current facility-administered medications for this visit.    Allergies as of 01/06/2016  . (No Known Allergies)    Vitals: BP 156/92 mmHg  Pulse 57  Ht 6\' 2"  (1.88 m)  Wt 228 lb (103.42 kg)  BMI 29.26 kg/m2 Last Weight:  Wt Readings from Last 1 Encounters:  01/06/16 228 lb (103.42 kg)   Last Height:   Ht Readings from Last 1 Encounters:  01/06/16 6\' 2"  (1.88 m)   Physical exam: Exam: Gen: NAD, conversant, well nourised, obese, well groomed                     CV: RRR, no MRG. No Carotid Bruits. No peripheral edema, warm, nontender Eyes: Conjunctivae clear without exudates or hemorrhage  Neuro: Detailed Neurologic Exam  Speech:    Speech is normal;  fluent and spontaneous with normal comprehension.  Cognition:    The patient is oriented to person, place, and time;     recent and remote memory intact;     language fluent;     normal attention, concentration,     fund of knowledge Cranial Nerves:    The pupils are equal, round, and reactive to light. The fundi are normal and spontaneous venous pulsations are present. Visual fields are full to finger confrontation. Extraocular movements are intact. Trigeminal sensation is intact and the muscles of mastication are normal. The face is symmetric. The palate elevates in the midline. Hearing impaired. Voice is normal. Shoulder shrug is normal. The tongue has normal motion without fasciculations.   Coordination:    Normal finger to nose and heel to shin. Normal rapid alternating movements.   Gait:    Heel-toe gait are normal. Difficulty with tandem, imbalance.  Motor Observation:    No asymmetry, no atrophy, and no involuntary movements noted. Tone:    Normal muscle tone.    Posture:    Posture is normal. normal erect    Strength:    Strength is V/V in the upper and lower limbs.      Sensation: intact to LT     Reflex  Exam:  DTR's:    Deep tendon reflexes in the upper and lower extremities are normal bilaterally.   Toes:    The toes are downgoing bilaterally.   Clonus:    Clonus is absent.      Assessment/Plan:  65 y.o. male here as a referral from Dr. Yong Channel for ataxia, dizziness, changes of visual perception. 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 Exam today is normal except for difficulty with tandem. Reviewed MRI of the brain with patient which showed extensive for age white matter disease in the cerebral hemispheres with a frontal predominance where there is developing confluent signal abnormality. The pattern is nonspecific but usually from chronic microvascular disease at this age the patient denies HTN, Diabetes, HLD, smoking, alcohol use or other vascular risk factors.    He does take Adderall which can increase blood pressure and his blood pressure is elevated in the office today but previous recordings are very normal. He may have elevated blood pressure which can be exacerbated by Adderall but he denies this, blood pressure has always been very good. We'll order a CTA of the head and neck to further evaluate his vessels. The pattern is not demyelinating and does not appear due to other white matter disorders. Last hemoglobin A1c 6.1 and lipid panel LDL 127 2 years ago, both slightly elevated. Recommend daily ASA 81 mg for stroke prevention.  Repeat HgbA1c and Lipid Panel. His hemoglobin A1c has been 6.1 in the past which is elevated and his LDL has been 127-134 which is also elevated. These vascular risk factors can cause chronic microvascular changes are seen in the brain but still his are a lot more extensive than I would think given these values. Need to recheck.     Sarina Ill, MD  [facility] [address] [place], [place] [id number]  Phone [phone] Fax [phone]

## 2016-01-07 ENCOUNTER — Telehealth: Payer: Self-pay | Admitting: *Deleted

## 2016-01-07 LAB — BASIC METABOLIC PANEL
BUN / CREAT RATIO: 16 (ref 10–24)
BUN: 16 mg/dL (ref 8–27)
CO2: 23 mmol/L (ref 18–29)
CREATININE: 1.03 mg/dL (ref 0.76–1.27)
Calcium: 9.4 mg/dL (ref 8.6–10.2)
Chloride: 103 mmol/L (ref 96–106)
GFR calc Af Amer: 87 mL/min/{1.73_m2} (ref >59)
GFR, EST NON AFRICAN AMERICAN: 75 mL/min/{1.73_m2} (ref >59)
Glucose: 103 mg/dL — ABNORMAL HIGH (ref 65–99)
Potassium: 4.5 mmol/L (ref 3.5–5.2)
SODIUM: 141 mmol/L (ref 134–144)

## 2016-01-07 NOTE — Telephone Encounter (Signed)
Dr Jaynee Eagles- FYI  Called and spoke to pt about normal labs per Dr Jaynee Eagles. He is scheduled for CT scans on 01/18/16. He states he was thinking about the past and while back his mother told him that when a baby, he had to go back to hospital d/t lips turning blue after he was born. He was not sure if this was anything in correlation, but just wanted to let Dr Jaynee Eagles know. I advised I will give her the message. His mother has since passed and he cannot clarify information with her.

## 2016-01-07 NOTE — Telephone Encounter (Signed)
-----   Message from Melvenia Beam, MD sent at 01/07/2016  7:34 AM EDT ----- Labs normal

## 2016-01-08 ENCOUNTER — Encounter: Payer: Self-pay | Admitting: Neurology

## 2016-01-08 ENCOUNTER — Telehealth: Payer: Self-pay | Admitting: Neurology

## 2016-01-08 NOTE — Patient Instructions (Signed)
Overall you are doing fairly well but I do want to suggest a few things today:   Remember to drink plenty of fluid, eat healthy meals and do not skip any meals. Try to eat protein with a every meal and eat a healthy snack such as fruit or nuts in between meals. Try to keep a regular sleep-wake schedule and try to exercise daily, particularly in the form of walking, 20-30 minutes a day, if you can.   As far as your medications are concerned, I would like to suggest daily baby aspirin  As far as diagnostic testing: Labs and imaging in the blood vessels.   Our phone number is [phone]. We also have an after hours call service for urgent matters and there is a physician on-call for urgent questions. For any emergencies you know to call 911 or go to the nearest emergency room

## 2016-01-08 NOTE — Telephone Encounter (Signed)
Peter Novak, plesae call patient and tell him that I would like him to come back and repeat his cholesterol panel and his hemoglobin A1c. After review of his record in all his labs, they have not been repeated in oveer a year or 2. Both have been elevated in the past, his last hemoglobin A1c was 6.1 which is considered prediabetes and his LDL was 127-134 which is elevated. These are vascular risk factors that can cause the white matter changes that we saw and discussed in detail on MRI of the brain. So I would like to have those repeated and he must be fasting. Please explain to him how he can come back into the office and have those done thank you.

## 2016-01-11 NOTE — Telephone Encounter (Signed)
LVM for pt to call. Gave [facility] phone number 

## 2016-01-12 NOTE — Telephone Encounter (Signed)
Called pt. Relayed results per Dr Jaynee Eagles note. He verbalized understanding. He will come Friday morning around 815am. He is out of town until Thursday. He knows he needs to be fasting for blood work per Dr Jaynee Eagles request.

## 2016-01-12 NOTE — Telephone Encounter (Signed)
Fuller Song [id number]  SAME AHERN  [phone] * May 05, 2051  RETURNED YOUR CALL

## 2016-01-15 ENCOUNTER — Other Ambulatory Visit (INDEPENDENT_AMBULATORY_CARE_PROVIDER_SITE_OTHER): Payer: Self-pay

## 2016-01-15 DIAGNOSIS — R9082 White matter disease, unspecified: Secondary | ICD-10-CM

## 2016-01-15 DIAGNOSIS — R42 Dizziness and giddiness: Secondary | ICD-10-CM

## 2016-01-15 DIAGNOSIS — R2689 Other abnormalities of gait and mobility: Secondary | ICD-10-CM

## 2016-01-15 DIAGNOSIS — E785 Hyperlipidemia, unspecified: Secondary | ICD-10-CM

## 2016-01-15 DIAGNOSIS — Z0289 Encounter for other administrative examinations: Secondary | ICD-10-CM

## 2016-01-15 DIAGNOSIS — R7302 Impaired glucose tolerance (oral): Secondary | ICD-10-CM

## 2016-01-16 LAB — LIPID PANEL
CHOLESTEROL TOTAL: 210 mg/dL — AB (ref 100–199)
Chol/HDL Ratio: 4.8 ratio units (ref 0.0–5.0)
HDL: 44 mg/dL (ref >39)
LDL Calculated: 137 mg/dL — ABNORMAL HIGH (ref 0–99)
TRIGLYCERIDES: 144 mg/dL (ref 0–149)
VLDL Cholesterol Cal: 29 mg/dL (ref 5–40)

## 2016-01-16 LAB — HEMOGLOBIN A1C
ESTIMATED AVERAGE GLUCOSE: 126 mg/dL
HEMOGLOBIN A1C: 6 % — AB (ref 4.8–5.6)

## 2016-01-18 ENCOUNTER — Telehealth: Payer: Self-pay | Admitting: *Deleted

## 2016-01-18 ENCOUNTER — Ambulatory Visit
Admission: RE | Admit: 2016-01-18 | Discharge: 2016-01-18 | Disposition: A | Discharge status: Home / Self Care | Payer: Medicare Other | Source: Ambulatory Visit | Attending: Neurology | Admitting: Neurology

## 2016-01-18 DIAGNOSIS — R2689 Other abnormalities of gait and mobility: Secondary | ICD-10-CM

## 2016-01-18 DIAGNOSIS — R9082 White matter disease, unspecified: Secondary | ICD-10-CM

## 2016-01-18 DIAGNOSIS — R42 Dizziness and giddiness: Secondary | ICD-10-CM

## 2016-01-18 MED ORDER — IOPAMIDOL (ISOVUE-370) INJECTION 76%
100.0000 mL | Freq: Once | INTRAVENOUS | Status: AC | PRN
  Administered 2016-01-18: 100 mL via INTRAVENOUS

## 2016-01-18 NOTE — Telephone Encounter (Signed)
Called and spoke to pt about lab results per Dr Jaynee Eagles note. Advised him to keep appt on 6/14 for him to discuss everything with Dr Jaynee Eagles. He verbalized understanding. He stated he had his CT scan done today.

## 2016-01-18 NOTE — Telephone Encounter (Signed)
-----   Message from Melvenia Beam, MD sent at 01/17/2016  8:55 PM EDT ----- Hgba1c is 6.0, this is considered pre-diabetes. Total cholesterol is elevated at 210 and LDL is elevated at 137. Recommend considering a statin such as Lipitor. We can discuss at next appointment thanks.

## 2016-01-19 ENCOUNTER — Telehealth: Payer: Self-pay | Admitting: *Deleted

## 2016-01-19 NOTE — Telephone Encounter (Signed)
Called and spoke to pt about results below per Dr Jaynee Eagles note. Pt verbalized understanding.

## 2016-01-19 NOTE — Telephone Encounter (Signed)
-----   Message from Melvenia Beam, MD sent at 01/18/2016 11:45 AM EDT ----- Imaging of the blood vessels is normal thanks. Again noted is the advanced microvascular disease we discussed at last appointment. We can discuss at follow up thanks

## 2016-02-10 ENCOUNTER — Telehealth: Payer: Self-pay | Admitting: Neurology

## 2016-02-10 ENCOUNTER — Encounter: Payer: Self-pay | Admitting: Neurology

## 2016-02-10 ENCOUNTER — Ambulatory Visit (INDEPENDENT_AMBULATORY_CARE_PROVIDER_SITE_OTHER): Payer: Medicare Other | Admitting: Neurology

## 2016-02-10 VITALS — BP 141/83 | HR 54 | Ht 74.0 in | Wt 226.6 lb

## 2016-02-10 DIAGNOSIS — E785 Hyperlipidemia, unspecified: Secondary | ICD-10-CM | POA: Diagnosis not present

## 2016-02-10 DIAGNOSIS — R930 Abnormal findings on diagnostic imaging of skull and head, not elsewhere classified: Secondary | ICD-10-CM | POA: Diagnosis not present

## 2016-02-10 DIAGNOSIS — I639 Cerebral infarction, unspecified: Secondary | ICD-10-CM | POA: Diagnosis not present

## 2016-02-10 DIAGNOSIS — R7302 Impaired glucose tolerance (oral): Secondary | ICD-10-CM

## 2016-02-10 DIAGNOSIS — G9349 Other encephalopathy: Secondary | ICD-10-CM

## 2016-02-10 DIAGNOSIS — I6789 Other cerebrovascular disease: Secondary | ICD-10-CM

## 2016-02-10 DIAGNOSIS — R9082 White matter disease, unspecified: Secondary | ICD-10-CM

## 2016-02-10 DIAGNOSIS — I67850 Cerebral autosomal dominant arteriopathy with subcortical infarcts and leukoencephalopathy: Secondary | ICD-10-CM

## 2016-02-10 MED ORDER — ALPRAZOLAM 0.5 MG PO TABS: 0.5000 mg | ORAL_TABLET | Freq: Two times a day (BID) | ORAL | Status: DC | PRN

## 2016-02-10 MED ORDER — ATORVASTATIN CALCIUM 20 MG PO TABS: 20.0000 mg | ORAL_TABLET | Freq: Every day | ORAL | Status: DC

## 2016-02-10 NOTE — Progress Notes (Signed)
[facility]  Provider: Dr Jaynee Eagles Referring Provider: Dr. Vicie Mutters Primary Care Physician: Garret Reddish, MD  CC: Abnormal white matter in the brain out of proportion to vascular risk factors  Interval history: 65 y.o. male here as a referral from Dr. Yong Channel for ataxia, dizziness, changes of visual perception. 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 .  He has advanced for age white matter changes in the brain. HgbA1c 6.0, LDL was elevated at 137. Today he brings in labs values since 1994 which showed he has had hyperlipidemia since then. He also had a 10 year. Of significant stress.. He has taken Adderall for many years in the past which can elevate blood pressure but trends in the clinic recently and at home have been normal. CTA of the head and neck showed no significant extracranial or intracranial stenosis of significance. Reviewed CTA of the head and neck which showed no significant atheroscleoris. He comes in with girlfriend today, reviewed MRi images and discussed findings with wife who was not here at last appointment.   CTA of the head and neck 01/18/2016: No extracranial or intracranial stenosis or occlusion of significance.  No abnormal postcontrast enhancement.  Hypoattenuation of white matter, better visualized on MR, consistent with advanced chronic microvascular ischemic change.   HPI: Peter Novak is a 64 y.o. male here as a referral from Dr. Yong Channel for ataxia, dizziness, changes of visual perception. 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 . He denies HTN, Diabetes, HLD, smoking, alcohol use or other vascular risk factors. He had some dizziness  earlier this year in March, within 15 minutes of waking up he would have some leaning to one side, spinning feeling, he would have some imbalance on walking which would resolve. It happened for several days otherwise nothing else happened. Had several episodes. The end of April he had it for 3 days continuously, dizziness, lightheaded, no room spinning, no CP, no SOB, no weakness, no other focal neurologic deficits. No episodes since April. No FHx of autoimmune disorders, parents died in their 44s, he is generally healthy. No inciting events, no head trauma or previous illnesses. No travel overseas. Nothing made the symptoms worse or better. No history of neurologic symptoms. No family history of autoimmune disorders, neurodegenerative, diseases. No other focal neurologic deficits, no dysarthria, no dysphagia, no aphasia, no vision changes, no weakness or sensory changes. Currently he denies no gait abnormalities. He has had worsening left hearing changes with fullness in the ear. Fullness in the ear feels better with Valsalva. Denies headaches, no family history of personal history of migraines.  Reviewed notes, labs and imaging from outside physicians, which showed: Reviewed notes by Dr. Hermina Barters. Seen in the office complaining of dizziness as a rotation torque sensation. Symptoms began 3-4 months ago spells in early March. Spells occurred in the morning or in the afternoon. Wobbly. No nausea or vomiting. He does describe decreased hearing in the left ear with a feeling of constant fullness. Feels better with Valsalva. He has a water-like feeling in both ears but no fluid draining out of either ear. Denied headaches, migraine disease. Exam reviewed patient's right tympanic membrane is stable, no middle ear effusion, no sign of infection, implant appears in place, asymmetric high  frequency sensorineural hearing loss, right ear greater than left. He has a history of Mnire's disease or auto sclerotic inner ear  syndrome.  MRI of the brain 12/16/2015 (personally reviewed images and agree with the following) Normal and symmetric labyrinthine signal. No thickening or enhancement along the vestibular cochlear nerves to suggest schwannoma. No enlarged endolymphatic sac. No specific explanation for ataxia - the cerebellum and brainstem has normal size and signal. Normal appearance of the vertebral and basilar arteries.  Extensive for age white matter disease in the cerebral hemispheres with a frontal predominance where there is developing confluent signal abnormality. The pattern is nonspecific but usually from chronic microvascular disease at this age. There is no specific demyelinating pattern.  IMPRESSION: 1. No explanation for the provided history. 2. Moderate white matter disease, nonspecific pattern but usually from chronic microvascular ischemia.   Review of Systems: Patient complains of symptoms per HPI as well as the following symptoms: ringing in ears, spinning sensation, dizziness . Pertinent negatives per HPI. All others negative.   Social History   Social History  . Marital Status: Married    Spouse Name: N/A  . Number of Children: N/A  . Years of Education: N/A   Occupational History  . Not on file.   Social History Main Topics  . Smoking status: Never Smoker   . Smokeless tobacco: Not on file  . Alcohol Use: 2.4 oz/week    2 Cans of beer, 2 Glasses of wine per week  . Drug Use: No  . Sexual Activity: Not on file   Other Topics Concern  . Not on file   Social History Narrative   Married 2006, divorced 2010. Currently dating. 2 children (35 and 33 in 2016 in good health), no grandkids.    [employer] grad.       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       Hobbies: place in [place] in [place], outdoor activities, reading, follow things on PC          Family History  Problem Relation Age of Onset  .  Diabetes Father     AAA  . Sudden death Brother 6    CAD; MI @ 41, smoker, sedentary, overweight, diabetes, HLD  . Diabetes Brother   . Hyperlipidemia Brother   . Diverticulosis Mother   . Colon cancer Maternal Aunt   . Heart attack Paternal Grandfather 76  . Stroke Neg Hx   . Neuropathy Neg Hx   . Multiple sclerosis Neg Hx   . Migraines Neg Hx   . Dementia Neg Hx     Past Medical History  Diagnosis Date  . ADD (attention deficit disorder with hyperactivity)   . Hyperlipidemia   . History of skin cancer     Melanoma X 2  . Hearing loss     Past Surgical History  Procedure Laterality Date  . Tonsillectomy and adenoidectomy    . Shoulder arthroscopy  2004    Dr Mayer Camel  . Vasectomy    . Elbow surgery  1994  . Hernia repair  2005  . Cholecystectomy  2005  . Inner ear surgery  2006    Incus transposition;oscillculoplasty; Dr Thornell Mule  . Inner ear surgery  2011    Stirrup resection  . Colonoscopy  2008    negative    Current Outpatient Prescriptions  Medication Sig Dispense Refill  . ALPRAZolam (XANAX) 0.5 MG tablet Take 0.5 mg by mouth. Reported on  10/14/2015    . dextroamphetamine (DEXEDRINE SPANSULE) 10 MG 24 hr capsule Take 10 mg by mouth daily.      [name] Kitchen omeprazole (PRILOSEC) 20 MG capsule Take 1 capsule (20 mg total) by mouth daily. Daily as needed 90 capsule 3  . tadalafil (CIALIS) 20 MG tablet Take 1 tablet (20 mg total) by mouth as needed. one every 3 days as needed 10 tablet 5   No current facility-administered medications for this visit.    Allergies as of 02/10/2016  . (No Known Allergies)    Vitals: There were no vitals taken for this visit. Last Weight:  Wt Readings from Last 1 Encounters:  01/06/16 228 lb (103.42 kg)   Last Height:   Ht Readings from Last 1 Encounters:  01/06/16 6\' 2"  (1.88 m)     Physical exam: Exam: Gen: NAD, conversant, well nourised, obese, well groomed  CV: RRR, no MRG. No Carotid Bruits. No peripheral  edema, warm, nontender Eyes: Conjunctivae clear without exudates or hemorrhage  Neuro: Detailed Neurologic Exam  Speech:  Speech is normal; fluent and spontaneous with normal comprehension.  Cognition:  The patient is oriented to person, place, and time;   recent and remote memory intact;   language fluent;   normal attention, concentration,   fund of knowledge Cranial Nerves:  The pupils are equal, round, and reactive to light. The fundi are normal and spontaneous venous pulsations are present. Visual fields are full to finger confrontation. Extraocular movements are intact. Trigeminal sensation is intact and the muscles of mastication are normal. The face is symmetric. The palate elevates in the midline. Hearing impaired. Voice is normal. Shoulder shrug is normal. The tongue has normal motion without fasciculations.   Coordination:  Normal finger to nose and heel to shin. Normal rapid alternating movements.   Gait:  Heel-toe gait are normal. Difficulty with tandem, imbalance.  Motor Observation:  No asymmetry, no atrophy, and no involuntary movements noted. Tone:  Normal muscle tone.   Posture:  Posture is normal. normal erect   Strength:  Strength is V/V in the upper and lower limbs.    Sensation: intact to LT   Reflex Exam:  DTR's:  Deep tendon reflexes in the upper and lower extremities are normal bilaterally.  Toes:  The toes are downgoing bilaterally.  Clonus:  Clonus is absent.     Assessment/Plan: 65 y.o. male here as a referral from Dr. Yong Channel for ataxia, dizziness, changes of visual perception. 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 Exam today is normal except for difficulty with tandem.   - Reviewed MRI of the brain with patient which showed advanced  for age white matter disease in the cerebral hemispheres with a frontal predominance where there is developing confluent signal abnormality. The pattern is nonspecific but usually from chronic microvascular disease at this age. Patient's hemoglobin A1c has been elevated at 6 and he returns with lab values showing hyperlipidemia over the last 20 years. he has no history of alcohol use, smoking, or HTN, ldl was elevated at 137, HgbA1c 6.0. High blood pressure over certain number of years possibly in the setting of Adderall may have contributed as well  - In light of the above findings I do feel that the white matter changes may be appropriate based on his vascular risk factors. However we will follow these white matter changes and repeat MRI of  the brain in 6 months to follow changes.  - CTA of the head and neck showed No extracranial or intracranial stenosis or occlusion of significance.  -  Recommend daily ASA 81 mg for stroke prevention.  - start lipitor 20mg  qhs  Sarina Ill, MD  [facility] [address] [place], [place] [address]  Phone [phone] Fax [phone]  A total of 40  minutes was spent face-to-face with this patient. Over half this time was spent on counseling patient on the abnormal white matter changes diagnosis and different diagnostic and therapeutic options available.

## 2016-02-10 NOTE — Telephone Encounter (Signed)
Patient is calling to physically pick up the Rx for ALPRAZolam Duanne Moron) 0.5 MG tablet from our office. Please call when ready for pick up.

## 2016-02-10 NOTE — Telephone Encounter (Signed)
Called pt back. Offered to fax prescription for him, but he declined. He wants to pick up rx at office today. Reminded him we close at [place] him to bring valid photo ID with him. He verbalized understanding.  Placed rx up front.

## 2016-05-23 ENCOUNTER — Telehealth: Payer: Self-pay | Admitting: Neurology

## 2016-05-23 NOTE — Telephone Encounter (Signed)
[facility], I had spoken to patient about a repeat MRI of the brain to ensure the white mater changes are stable. Can you cal land see if he is willing for a repeat if insurance will approve? Thanks!

## 2016-05-23 NOTE — Telephone Encounter (Signed)
LVM relaying Dr Cathren Laine message below. Asked patient to call back and let us know if he is agreeable to repeat MRI brain. Gave [facility] phone number.

## 2016-05-25 NOTE — Telephone Encounter (Signed)
Dr Jaynee Eagles- can you place order for repeat MRI brain please?   Called and spoke to patient. He is agreeable to have repeat MRI brain per Dr Jaynee Eagles recommendation. Advised Dr Jaynee Eagles will place order and he should here in a week/week and a half to schedule. He verbalized understanding.

## 2016-05-29 ENCOUNTER — Other Ambulatory Visit: Payer: Self-pay | Admitting: Neurology

## 2016-05-29 DIAGNOSIS — R42 Dizziness and giddiness: Secondary | ICD-10-CM

## 2016-05-29 DIAGNOSIS — R9082 White matter disease, unspecified: Secondary | ICD-10-CM

## 2016-05-29 DIAGNOSIS — G379 Demyelinating disease of central nervous system, unspecified: Secondary | ICD-10-CM

## 2016-05-29 DIAGNOSIS — H539 Unspecified visual disturbance: Secondary | ICD-10-CM

## 2016-06-14 NOTE — Telephone Encounter (Signed)
Pt called in to check on MRI appt status. Please call and advise

## 2016-06-15 NOTE — Telephone Encounter (Signed)
LVM for pt letting him know MRI order has been sent to [facility]

## 2016-06-15 NOTE — Telephone Encounter (Signed)
[facility], this patient has been sent to [place], would you mind calling to let him know?

## 2016-06-29 ENCOUNTER — Ambulatory Visit
Admission: RE | Admit: 2016-06-29 | Discharge: 2016-06-29 | Disposition: A | Discharge status: Home / Self Care | Payer: Medicare Other | Source: Ambulatory Visit | Attending: Neurology | Admitting: Neurology

## 2016-06-29 DIAGNOSIS — G379 Demyelinating disease of central nervous system, unspecified: Secondary | ICD-10-CM

## 2016-06-29 DIAGNOSIS — R9082 White matter disease, unspecified: Secondary | ICD-10-CM

## 2016-06-29 DIAGNOSIS — H539 Unspecified visual disturbance: Secondary | ICD-10-CM

## 2016-06-29 DIAGNOSIS — R42 Dizziness and giddiness: Secondary | ICD-10-CM

## 2016-06-29 DIAGNOSIS — R930 Abnormal findings on diagnostic imaging of skull and head, not elsewhere classified: Secondary | ICD-10-CM | POA: Diagnosis not present

## 2016-06-29 MED ORDER — GADOBENATE DIMEGLUMINE 529 MG/ML IV SOLN
20.0000 mL | Freq: Once | INTRAVENOUS | Status: AC | PRN
  Administered 2016-06-29: 20 mL via INTRAVENOUS

## 2016-07-01 ENCOUNTER — Telehealth: Payer: Self-pay | Admitting: *Deleted

## 2016-07-01 NOTE — Telephone Encounter (Signed)
-----   Message from Melvenia Beam, MD sent at 07/01/2016  9:02 AM EDT ----- MRi of the brain stable, no changes great news thanks

## 2016-07-01 NOTE — Telephone Encounter (Signed)
Called and LVM for pt about MRI brain results per AA, MD note. Gave [facility] phone number if he has further questions.

## 2016-10-10 ENCOUNTER — Encounter: Payer: Self-pay | Admitting: Gastroenterology

## 2016-12-08 LAB — PSA: PSA: 7.53

## 2017-01-17 ENCOUNTER — Encounter: Payer: Self-pay | Admitting: Family Medicine

## 2017-01-17 ENCOUNTER — Ambulatory Visit (INDEPENDENT_AMBULATORY_CARE_PROVIDER_SITE_OTHER): Payer: Medicare Other | Admitting: Family Medicine

## 2017-01-17 VITALS — BP 136/80 | HR 53 | Temp 98.2°F | Ht 74.5 in | Wt 224.0 lb

## 2017-01-17 DIAGNOSIS — E782 Mixed hyperlipidemia: Secondary | ICD-10-CM

## 2017-01-17 DIAGNOSIS — M150 Primary generalized (osteo)arthritis: Secondary | ICD-10-CM | POA: Diagnosis not present

## 2017-01-17 DIAGNOSIS — Z7289 Other problems related to lifestyle: Secondary | ICD-10-CM | POA: Diagnosis not present

## 2017-01-17 DIAGNOSIS — R739 Hyperglycemia, unspecified: Secondary | ICD-10-CM | POA: Diagnosis not present

## 2017-01-17 DIAGNOSIS — C61 Malignant neoplasm of prostate: Secondary | ICD-10-CM

## 2017-01-17 DIAGNOSIS — Z0000 Encounter for general adult medical examination without abnormal findings: Secondary | ICD-10-CM

## 2017-01-17 DIAGNOSIS — Z1211 Encounter for screening for malignant neoplasm of colon: Secondary | ICD-10-CM

## 2017-01-17 DIAGNOSIS — M159 Polyosteoarthritis, unspecified: Secondary | ICD-10-CM

## 2017-01-17 LAB — COMPREHENSIVE METABOLIC PANEL
ALT: 25 U/L (ref 0–53)
AST: 20 U/L (ref 0–37)
Albumin: 4.6 g/dL (ref 3.5–5.2)
Alkaline Phosphatase: 63 U/L (ref 39–117)
BILIRUBIN TOTAL: 1.1 mg/dL (ref 0.2–1.2)
BUN: 15 mg/dL (ref 6–23)
CO2: 29 mEq/L (ref 19–32)
Calcium: 9.8 mg/dL (ref 8.4–10.5)
Chloride: 103 mEq/L (ref 96–112)
Creatinine, Ser: 1.03 mg/dL (ref 0.40–1.50)
GFR: 76.48 mL/min (ref >60.00)
GLUCOSE: 116 mg/dL — AB (ref 70–99)
Potassium: 4.6 mEq/L (ref 3.5–5.1)
SODIUM: 139 meq/L (ref 135–145)
TOTAL PROTEIN: 7.1 g/dL (ref 6.0–8.3)

## 2017-01-17 LAB — CBC
HCT: 42.3 % (ref 39.0–52.0)
HEMOGLOBIN: 14.1 g/dL (ref 13.0–17.0)
MCHC: 33.2 g/dL (ref 30.0–36.0)
MCV: 88.8 fl (ref 78.0–100.0)
Platelets: 212 10*3/uL (ref 150.0–400.0)
RBC: 4.76 Mil/uL (ref 4.22–5.81)
RDW: 13.7 % (ref 11.5–15.5)
WBC: 4.4 10*3/uL (ref 4.0–10.5)

## 2017-01-17 LAB — LIPID PANEL
Cholesterol: 235 mg/dL — ABNORMAL HIGH (ref 0–200)
HDL: 40.5 mg/dL (ref >39.00)
NonHDL: 194.26
Total CHOL/HDL Ratio: 6
Triglycerides: 201 mg/dL — ABNORMAL HIGH (ref 0.0–149.0)
VLDL: 40.2 mg/dL — ABNORMAL HIGH (ref 0.0–40.0)

## 2017-01-17 LAB — HEMOGLOBIN A1C: HEMOGLOBIN A1C: 6.2 % (ref 4.6–6.5)

## 2017-01-17 LAB — LDL CHOLESTEROL, DIRECT: Direct LDL: 148 mg/dL

## 2017-01-17 MED ORDER — ZOSTER VAC RECOMB ADJUVANTED 50 MCG/0.5ML IM SUSR: 0.5000 mL | Freq: Once | INTRAMUSCULAR | 1 refills | Status: AC

## 2017-01-17 NOTE — Assessment & Plan Note (Signed)
HLD-  lipitor in past and had myalgias so he stopped completely.  Lab Results  Component Value Date   CHOL 235 (H) 01/17/2017   HDL 40.50 01/17/2017   LDLCALC 137 (H) 01/15/2016   LDLDIRECT 148.0 01/17/2017   TRIG 201.0 (H) 01/17/2017   CHOLHDL 6 01/17/2017  would like to restart statin but will hold off to see if can get 15 lbs off in next 3 months and reduce a1c and LDL with this- if a1c is lower- consider starting atorvastatin 20mg  back 1-2 a week

## 2017-01-17 NOTE — Assessment & Plan Note (Signed)
R shoulder pain intermittent likely from underlying OA- 2 week spurts of daily nsaids at times- discussed watching kidneys and heart risk. He asks about using for 2 week spurts twice a day aleve or ibuprofen. He agrees to return every 6 months for bmet at minimum with

## 2017-01-17 NOTE — Assessment & Plan Note (Signed)
Low Grade Gleason 3+3=6. Active surveillance. PSA trend not rising rapidly

## 2017-01-17 NOTE — Progress Notes (Signed)
Phone: [phone]  Subjective:  Patient presents today for their annual physical. Chief complaint-noted.   See problem oriented charting- ROS- full  review of systems was completed and negative except for: myalgias on statin so stopped. No chest pain or shortness of breath. No headache or blurry vision.   The following were reviewed and entered/updated in epic: Past Medical History:  Diagnosis Date  . ADD (attention deficit disorder with hyperactivity)   . Hearing loss   . History of skin cancer    Melanoma X 2  . Hyperlipidemia    Patient Active Problem List   Diagnosis Date Noted  . ADD (attention deficit disorder) 10/02/2014    Priority: Medium  . Insomnia 10/02/2014    Priority: Medium  . Osteoarthritis 10/02/2014    Priority: Medium  . Prostate cancer ([place]) 02/12/2013    Priority: Medium  . Hyperglycemia 11/30/2009    Priority: Medium  . History of melanoma 11/30/2009    Priority: Medium  . HYPERLIPIDEMIA 07/10/2007    Priority: Medium  . Erectile dysfunction 10/02/2014    Priority: Low  . Eustachian tube dysfunction 10/02/2014    Priority: Low  . GERD (gastroesophageal reflux disease) 02/12/2013    Priority: Low  . Benign neoplasm of adrenal gland 11/30/2009    Priority: Low   Past Surgical History:  Procedure Laterality Date  . CHOLECYSTECTOMY  2005  . COLONOSCOPY  2008   negative  . ELBOW SURGERY  1994  . HERNIA REPAIR  2005  . INNER EAR SURGERY  2006   Incus transposition;oscillculoplasty; Dr Thornell Mule  . INNER EAR SURGERY  2011   Stirrup resection  . SHOULDER ARTHROSCOPY  2004   Dr Mayer Camel  . TONSILLECTOMY AND ADENOIDECTOMY    . VASECTOMY      Family History  Problem Relation Age of Onset  . Diabetes Father        AAA  . Sudden death Brother 65       CAD; MI @ 87, smoker, sedentary, overweight, diabetes, HLD  . Diabetes Brother   . Hyperlipidemia Brother   . Diverticulosis Mother   . Colon cancer Maternal Aunt   . Heart attack Paternal  Grandfather 74  . Stroke Neg Hx   . Neuropathy Neg Hx   . Multiple sclerosis Neg Hx   . Migraines Neg Hx   . Dementia Neg Hx     Medications- reviewed and updated Current Outpatient Prescriptions  Medication Sig Dispense Refill  . ALPRAZolam (XANAX) 0.5 MG tablet Take 1 tablet (0.5 mg total) by mouth 2 (two) times daily as needed for anxiety. 30 tablet 2  . amphetamine-dextroamphetamine (ADDERALL XR) 5 MG 24 hr capsule Take 5 mg by mouth daily.  0  . dextroamphetamine (DEXTROSTAT) 5 MG tablet Take 5 mg by mouth as needed.  0  . omeprazole (PRILOSEC) 20 MG capsule Take 1 capsule (20 mg total) by mouth daily. Daily as needed 90 capsule 3  . tadalafil (CIALIS) 20 MG tablet Take 1 tablet (20 mg total) by mouth as needed. one every 3 days as needed 10 tablet 5  . zolpidem (AMBIEN) 10 MG tablet Take 10 mg by mouth as needed.  0  . atorvastatin (LIPITOR) 20 MG tablet Take 1 tablet (20 mg total) by mouth daily. (Patient not taking: Reported on 01/17/2017) 30 tablet 11  . Zoster Vac Recomb Adjuvanted [facility]) injection Inject 0.5 mLs into the muscle once. Repeat injection in 2-6 months. Please inform  when completed. 0.5 mL 1  No current facility-administered medications for this visit.     Allergies-reviewed and updated No Known Allergies  Social History   Social History  . Marital status: Married    Spouse name: N/A  . Number of children: N/A  . Years of education: N/A   Social History Main Topics  . Smoking status: Never Smoker  . Smokeless tobacco: Never Used  . Alcohol use 2.4 oz/week    2 Glasses of wine, 2 Cans of beer per week  . Drug use: No  . Sexual activity: Not Asked   Other Topics Concern  . None   Social History Narrative   Married 2006, divorced 2010. Currently dating. 2 children (35 and 33 in 2016 in good health), no grandkids.    [employer] grad.       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        Hobbies: place in [place] in [place], outdoor activities, reading, follow things on PC          Objective: BP 136/80 (BP Location: Left Arm, Patient Position: Sitting, Cuff Size: Large)   Pulse (!) 53   Temp 98.2 F (36.8 C) (Oral)   Ht 6' 2.5" (1.892 m)   Wt 224 lb (101.6 kg)   SpO2 97%   BMI 28.38 kg/m  Gen: NAD, resting comfortably HEENT: Mucous membranes are moist. Oropharynx normal Neck: no thyromegaly CV: RRR no murmurs rubs or gallops Lungs: CTAB no crackles, wheeze, rhonchi Abdomen: soft/nontender/nondistended/normal bowel sounds. No rebound or guarding.  Ext: no edema Skin: warm, dry Neuro: grossly normal, moves all extremities, PERRLA No rectal exam due to prostatectomy  Assessment/Plan:  66 y.o. male presenting for annual physical.  Health Maintenance counseling: 1. Anticipatory guidance: Patient counseled regarding regular dental exams -q6 months, eye exams - yearly, wearing seatbelts.  2. Risk factor reduction:  Advised patient of need for regular exercise and diet rich and fruits and vegetables to reduce risk of heart attack and stroke. Exercise- not good since January- bad weather and home remodeling- advised 150 minutes exercise per week. Diet-needs to reduce his sodium intake and snack foods- otherwise reasonable. Would love to see weight down 10-15 lbs in next year.   Wt Readings from Last 3 Encounters:  01/17/17 224 lb (101.6 kg)  02/10/16 226 lb 9.6 oz (102.8 kg)  01/06/16 228 lb (103.4 kg)  3. Immunizations/screenings/ancillary studies- offered shingrix ,printed and will receive at pharmacy Immunization History  Administered Date(s) Administered  . Influenza,inj,quad, With Preservative 06/19/2015  . Influenza-Unspecified 06/12/2014  . Pneumococcal Conjugate-13 10/02/2014  . Pneumococcal Polysaccharide-23 10/14/2015  . Td 11/30/2009  . Tdap 09/30/2015  . Zoster 03/23/2011  4. Prostate cancer - in active surveillance- follows with Dr. Karsten Ro with  urology. PSA was stable around 7 in April. Biopsy 2017 Gleason 3+3=6 Lab Results  Component Value Date   PSA 6.48 (H) 03/22/2011   PSA 5.61 (H) 12/11/2009   PSA 3.61 11/16/2006   5. Colon cancer screening - last colonoscopy 2008- advised repeat, referred today 6. Skin cancer screening- follows with Dr. 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   He did mention a cyst behind left ear which he will follow up with dermatology for  Psychiatry prescribes xanax for sleep, ADD medicines dextroamphetamine through them as well- has been using less lately which we discussed is ideal  to minimiaze use  Osteoarthritis R shoulder pain intermittent likely from underlying OA- 2 week spurts of daily nsaids at times- discussed watching kidneys and heart risk. He asks about using for 2 week spurts twice a day aleve or ibuprofen. He agrees to return every 6 months for bmet at minimum with   Prostate cancer ([place]) Low Grade Gleason 3+3=6. Active surveillance. PSA trend not rising rapidly  Hyperglycemia S: discussed increased risk of diabetes Lab Results  Component Value Date   HGBA1C 6.2 01/17/2017  A/P: Encouraged need for healthy eating, regular exercise, weight loss.  See result note but goal 15 lbs down in 3 months. Avoid statin even though LDL elevateduntil risk lower - 15 or 16 week follow up  HYPERLIPIDEMIA HLD-  lipitor in past and had myalgias so he stopped completely.  Lab Results  Component Value Date   CHOL 235 (H) 01/17/2017   HDL 40.50 01/17/2017   LDLCALC 137 (H) 01/15/2016   LDLDIRECT 148.0 01/17/2017   TRIG 201.0 (H) 01/17/2017   CHOLHDL 6 01/17/2017  would like to restart statin but will hold off to see if can get 15 lbs off in next 3 months and reduce a1c and LDL with this- if a1c is lower- consider starting atorvastatin 20mg  back 1-2 a week  6 months verbal  Orders  Placed This Encounter  Procedures  . CBC    [place]  . Comprehensive metabolic panel    [place]    Order Specific Question:   Has the patient fasted?    Answer:   No  . Lipid panel    [place]    Order Specific Question:   Has the patient fasted?    Answer:   No  . Hemoglobin A1c    [place]  . Hepatitis C antibody  . LDL cholesterol, direct  . Ambulatory referral to Gastroenterology    Referral Priority:   Routine    Referral Type:   Consultation    Referral Reason:   Specialty Services Required    Number of Visits Requested:   1    Meds ordered this encounter  Medications  . Zoster Vac Recomb Adjuvanted [facility]) injection    Sig: Inject 0.5 mLs into the muscle once. Repeat injection in 2-6 months. Please inform  when completed.    Dispense:  0.5 mL    Refill:  1    Return precautions advised.  Garret Reddish, MD

## 2017-01-17 NOTE — Patient Instructions (Addendum)
Please stop by lab before you go  Would love to see 10-15 lbs off by next year  If using the nsaids regulary- check in 6 months so we can check on kidneys and blood pressure

## 2017-01-17 NOTE — Assessment & Plan Note (Signed)
S: discussed increased risk of diabetes Lab Results  Component Value Date   HGBA1C 6.2 01/17/2017  A/P: Encouraged need for healthy eating, regular exercise, weight loss.  See result note but goal 15 lbs down in 3 months. Avoid statin even though LDL elevateduntil risk lower - 15 or 16 week follow up

## 2017-01-18 LAB — HEPATITIS C ANTIBODY: HCV AB: NEGATIVE

## 2017-01-25 ENCOUNTER — Other Ambulatory Visit: Payer: Self-pay

## 2017-01-25 DIAGNOSIS — R739 Hyperglycemia, unspecified: Secondary | ICD-10-CM

## 2017-01-25 DIAGNOSIS — E785 Hyperlipidemia, unspecified: Secondary | ICD-10-CM

## 2017-04-27 ENCOUNTER — Other Ambulatory Visit (INDEPENDENT_AMBULATORY_CARE_PROVIDER_SITE_OTHER): Payer: Medicare Other

## 2017-04-27 DIAGNOSIS — R739 Hyperglycemia, unspecified: Secondary | ICD-10-CM | POA: Diagnosis not present

## 2017-04-27 DIAGNOSIS — E785 Hyperlipidemia, unspecified: Secondary | ICD-10-CM | POA: Diagnosis not present

## 2017-04-27 LAB — LDL CHOLESTEROL, DIRECT: Direct LDL: 71 mg/dL

## 2017-04-27 LAB — HEMOGLOBIN A1C: HEMOGLOBIN A1C: 5.9 % (ref 4.6–6.5)

## 2017-05-02 ENCOUNTER — Encounter: Payer: Self-pay | Admitting: Family Medicine

## 2017-05-02 ENCOUNTER — Ambulatory Visit (INDEPENDENT_AMBULATORY_CARE_PROVIDER_SITE_OTHER): Payer: Medicare Other | Admitting: Family Medicine

## 2017-05-02 VITALS — BP 110/72 | HR 58 | Temp 98.2°F | Ht 74.5 in | Wt 217.8 lb

## 2017-05-02 DIAGNOSIS — E782 Mixed hyperlipidemia: Secondary | ICD-10-CM

## 2017-05-02 DIAGNOSIS — R739 Hyperglycemia, unspecified: Secondary | ICD-10-CM | POA: Diagnosis not present

## 2017-05-02 DIAGNOSIS — C61 Malignant neoplasm of prostate: Secondary | ICD-10-CM

## 2017-05-02 DIAGNOSIS — H9221 Otorrhagia, right ear: Secondary | ICD-10-CM | POA: Diagnosis not present

## 2017-05-02 NOTE — Assessment & Plan Note (Signed)
S: very well controlled on atorvastatin 2 nights a week (and weight loss) with LDL down from 148 to 171. No myalgias.   A/P: drastic improvement. Continue current meds. If continues to lose weight perhaps try onc ea week atorvastatin and monitor LDL

## 2017-05-02 NOTE — Assessment & Plan Note (Signed)
S: improved controlled since CPE- has lost 7 lbs!   Exercise and diet-  cut down on salty, crunchy foods. Does lean one protein drink for lunch and has protein bar if gets hungry. Walking 3-4 days a week. Dinner similar- tries to eat balance ddinner.  Lab Results  Component Value Date   HGBA1C 5.9 04/27/2017   HGBA1C 6.2 01/17/2017   HGBA1C 6.0 (H) 01/15/2016   A/P: Targetting under 210 on home scales by follow up. Hoping to start biking. Follow up at CPE and recheck CBG and a1c

## 2017-05-02 NOTE — Progress Notes (Signed)
Subjective:  Peter Novak is a 66 y.o. year old very pleasant male patient who presents for/with See problem oriented charting ROS- ear fullness on right, congestion on right, no fever or chills. No chest pain or shortness of breath.    Past Medical History-  Patient Active Problem List   Diagnosis Date Noted  . ADD (attention deficit disorder) 10/02/2014    Priority: Medium  . Insomnia 10/02/2014    Priority: Medium  . Osteoarthritis 10/02/2014    Priority: Medium  . Prostate cancer ([place]) 02/12/2013    Priority: Medium  . Hyperglycemia 11/30/2009    Priority: Medium  . History of melanoma 11/30/2009    Priority: Medium  . HYPERLIPIDEMIA 07/10/2007    Priority: Medium  . Erectile dysfunction 10/02/2014    Priority: Low  . Eustachian tube dysfunction 10/02/2014    Priority: Low  . GERD (gastroesophageal reflux disease) 02/12/2013    Priority: Low  . Benign neoplasm of adrenal gland 11/30/2009    Priority: Low    Medications- reviewed and updated Current Outpatient Prescriptions  Medication Sig Dispense Refill  . ALPRAZolam (XANAX) 0.5 MG tablet Take 1 tablet (0.5 mg total) by mouth 2 (two) times daily as needed for anxiety. 30 tablet 2  . amphetamine-dextroamphetamine (ADDERALL XR) 5 MG 24 hr capsule Take 5 mg by mouth daily.  0  . atorvastatin (LIPITOR) 20 MG tablet Take 1 tablet (20 mg total) by mouth daily. 30 tablet 11  . dextroamphetamine (DEXTROSTAT) 5 MG tablet Take 5 mg by mouth as needed.  0  . omeprazole (PRILOSEC) 20 MG capsule Take 1 capsule (20 mg total) by mouth daily. Daily as needed 90 capsule 3  . tadalafil (CIALIS) 20 MG tablet Take 1 tablet (20 mg total) by mouth as needed. one every 3 days as needed 10 tablet 5  . zolpidem (AMBIEN) 10 MG tablet Take 10 mg by mouth as needed.  0   No current facility-administered medications for this visit.     Objective: BP 110/72 (BP Location: Left Arm, Patient Position: Sitting, Cuff Size: Large)   Pulse (!) 58    Temp 98.2 F (36.8 C) (Oral)   Ht 6' 2.5" (1.892 m)   Wt 217 lb 12.8 oz (98.8 kg)   SpO2 97%   BMI 27.59 kg/m  Gen: NAD, resting comfortably Dried blood in right ear canal covering entire bottom of canal. Visible portion of TM normal CV: RRR no murmurs rubs or gallops Lungs: CTAB no crackles, wheeze, rhonchi Abdomen: soft/nontender/nondistended/overweight but improved Ext: no edema Skin: warm, dry, no rash  Assessment/Plan:   1. Blood in right ear canal  S:Every august gets some extra congestion, tends to get right sided headache, throat clearing. Fullness in his ear increased  Blood in ear canal each time he wipes since Sunday afternoon- light amounts since that time. Feelslike water in his ear.  A/P: I strongly encouraged patient to follow up with Peter Novak- he agrees.     Hyperglycemia S: improved controlled since CPE- has lost 7 lbs!   Exercise and diet-  cut down on salty, crunchy foods. Does lean one protein drink for lunch and has protein bar if gets hungry. Walking 3-4 days a week. Dinner similar- tries to eat balance ddinner.  Lab Results  Component Value Date   HGBA1C 5.9 04/27/2017   HGBA1C 6.2 01/17/2017   HGBA1C 6.0 (H) 01/15/2016   A/P: Targetting under 210 on home scales by follow up. Hoping to start biking. Follow  up at CPE and recheck CBG and a1c   HYPERLIPIDEMIA S: very well controlled on atorvastatin 2 nights a week (and weight loss) with LDL down from 148 to 171. No myalgias.   A/P: drastic improvement. Continue current meds. If continues to lose weight perhaps try onc ea week atorvastatin and monitor LDL  Prostate cancer ([name]) S:Low Grade Gleason 3+3=6. Active surveillance. Peter Karsten Ro. States last PSA was 7.5 which is lower than previous. Rectal exam was stable A/P: continue to monitor/support patient- he seems to be encouraged by current #s and plan    Future Appointments Date Time Provider [place]  05/15/2017 8:30 AM [facility] PREVISIT  RM 51 [facility]  05/22/2017 11:30 AM Mauri Pole, MD [facility]  01/23/2018 11:00 AM Wynetta Fines, RN [facility]   01/17/18 CPE planned as long as still losing weight.   Return precautions advised.  Garret Reddish, MD

## 2017-05-02 NOTE — Assessment & Plan Note (Signed)
S:Low Grade Gleason 3+3=6. Active surveillance. Dr Karsten Ro. States last PSA was 7.5 which is lower than previous. Rectal exam was stable A/P: continue to monitor/support patient- he seems to be encouraged by current #s and plan

## 2017-05-02 NOTE — Patient Instructions (Signed)
Great job with healthy eating, weight loss, exercise. You have substantially reduced your risk of diabetes as a result. Keep up the positive efforts  fantastic improvement in cholesterol as well. You could try once a week if you want on the cholesterol medicine or just keep with your current regimen because it looks so great  Would follow up with ENT for the ear

## 2017-05-15 ENCOUNTER — Ambulatory Visit (AMBULATORY_SURGERY_CENTER): Payer: Self-pay | Admitting: *Deleted

## 2017-05-15 VITALS — Ht 75.0 in | Wt 216.0 lb

## 2017-05-15 DIAGNOSIS — Z1211 Encounter for screening for malignant neoplasm of colon: Secondary | ICD-10-CM

## 2017-05-15 MED ORDER — NA SULFATE-K SULFATE-MG SULF 17.5-3.13-1.6 GM/177ML PO SOLN: ORAL | 0 refills | Status: DC

## 2017-05-15 NOTE — Progress Notes (Signed)
Patient denies any allergies to eggs or soy. Patient denies any problems with anesthesia/sedation. Patient denies any oxygen use at home and does not take any diet/weight loss medications. EMMI education assisgned to patient on colonoscopy, this was explained and instructions given to patient. 

## 2017-05-22 ENCOUNTER — Ambulatory Visit (AMBULATORY_SURGERY_CENTER): Payer: Medicare Other | Admitting: Gastroenterology

## 2017-05-22 ENCOUNTER — Encounter: Payer: Self-pay | Admitting: Gastroenterology

## 2017-05-22 VITALS — BP 149/93 | HR 48 | Temp 97.5°F | Resp 13 | Ht 75.0 in | Wt 216.0 lb

## 2017-05-22 DIAGNOSIS — D123 Benign neoplasm of transverse colon: Secondary | ICD-10-CM

## 2017-05-22 DIAGNOSIS — D122 Benign neoplasm of ascending colon: Secondary | ICD-10-CM

## 2017-05-22 DIAGNOSIS — Z1211 Encounter for screening for malignant neoplasm of colon: Secondary | ICD-10-CM | POA: Diagnosis present

## 2017-05-22 MED ORDER — SODIUM CHLORIDE 0.9 % IV SOLN: 500.0000 mL | INTRAVENOUS | Status: DC

## 2017-05-22 NOTE — Op Note (Signed)
[facility] Patient Name: Peter Novak Procedure Date: 05/22/2017 11:12 AM MRN: [id number] Endoscopist: Mauri Pole , MD Age: 66 Referring MD:  Date of Birth: 1950-09-28 Gender: Male Account #: [id number] Procedure:                Colonoscopy Indications:              Screening for colorectal malignant neoplasm Medicines:                Monitored Anesthesia Care Procedure:                Pre-Anesthesia Assessment:                           - Prior to the procedure, a History and Physical                            was performed, and patient medications and                            allergies were reviewed. The patient's tolerance of                            previous anesthesia was also reviewed. The risks                            and benefits of the procedure and the sedation                            options and risks were discussed with the patient.                            All questions were answered, and informed consent                            was obtained. Prior Anticoagulants: The patient has                            taken no previous anticoagulant or antiplatelet                            agents. ASA Grade Assessment: II - A patient with                            mild systemic disease. After reviewing the risks                            and benefits, the patient was deemed in                            satisfactory condition to undergo the procedure.                           After obtaining informed consent, the colonoscope  was passed under direct vision. Throughout the                            procedure, the patient's blood pressure, pulse, and                            oxygen saturations were monitored continuously. The                            Model CF-HQ190L [id number]) scope was introduced                            through the anus and advanced to the the cecum,                            identified by  appendiceal orifice and ileocecal                            valve. The colonoscopy was performed without                            difficulty. The patient tolerated the procedure                            well. The quality of the bowel preparation was                            excellent. The ileocecal valve, appendiceal                            orifice, and rectum were photographed. Scope In: 11:19:01 AM Scope Out: 11:36:37 AM Scope Withdrawal Time: 0 hours 14 minutes 10 seconds  Total Procedure Duration: 0 hours 17 minutes 36 seconds  Findings:                 The perianal and digital rectal examinations were                            normal.                           A 9 mm polyp was found in the ascending colon. The                            polyp was sessile. The polyp was removed with a                            cold snare. Resection and retrieval were complete.                           A 3 mm polyp was found in the transverse colon. The                            polyp was sessile. The polyp was removed  with a                            cold biopsy forceps. Resection and retrieval were                            complete.                           Multiple small and large-mouthed diverticula were                            found in the sigmoid colon, descending colon,                            transverse colon and ascending colon.                           Non-bleeding internal hemorrhoids were found during                            retroflexion. The hemorrhoids were small.                           The exam was otherwise without abnormality. Complications:            No immediate complications. Estimated Blood Loss:     Estimated blood loss was minimal. Impression:               - One 9 mm polyp in the ascending colon, removed                            with a cold snare. Resected and retrieved.                           - One 3 mm polyp in the transverse colon,  removed                            with a cold biopsy forceps. Resected and retrieved.                           - Diverticulosis in the sigmoid colon, in the                            descending colon, in the transverse colon and in                            the ascending colon.                           - Non-bleeding internal hemorrhoids.                           - The examination was otherwise normal. Recommendation:           - Patient has a contact number available for  emergencies. The signs and symptoms of potential                            delayed complications were discussed with the                            patient. Return to normal activities tomorrow.                            Written discharge instructions were provided to the                            patient.                           - Resume previous diet.                           - Continue present medications.                           - Await pathology results.                           - Repeat colonoscopy in 5-10 years for surveillance                            based on pathology results. Mauri Pole, MD 05/22/2017 11:40:59 AM This report has been signed electronically.

## 2017-05-22 NOTE — Patient Instructions (Addendum)
YOU HAD AN ENDOSCOPIC PROCEDURE TODAY AT [facility]:   Refer to the procedure report that was given to you for any specific questions about what was found during the examination.  If the procedure report does not answer your questions, please call your gastroenterologist to clarify.  If you requested that your care partner not be given the details of your procedure findings, then the procedure report has been included in a sealed envelope for you to review at your convenience later.  YOU SHOULD EXPECT: Some feelings of bloating in the abdomen. Passage of more gas than usual.  Walking can help get rid of the air that was put into your GI tract during the procedure and reduce the bloating. If you had a lower endoscopy (such as a colonoscopy or flexible sigmoidoscopy) you may notice spotting of blood in your stool or on the toilet paper. If you underwent a bowel prep for your procedure, you may not have a normal bowel movement for a few days.  Please Note:  You might notice some irritation and congestion in your nose or some drainage.  This is from the oxygen used during your procedure.  There is no need for concern and it should clear up in a day or so.  SYMPTOMS TO REPORT IMMEDIATELY:   Following lower endoscopy (colonoscopy or flexible sigmoidoscopy):  Excessive amounts of blood in the stool  Significant tenderness or worsening of abdominal pains  Swelling of the abdomen that is new, acute  Fever of 100F or higher   For urgent or emergent issues, a gastroenterologist can be reached at any hour by calling [phone].   DIET:  We do recommend a small meal at first, but then you may proceed to your regular diet.  Drink plenty of fluids but you should avoid alcoholic beverages for 24 hours.  ACTIVITY:  You should plan to take it easy for the rest of today and you should NOT DRIVE or use heavy machinery until tomorrow (because of the sedation medicines used during the test).     FOLLOW UP: Our staff will call the number listed on your records the next business day following your procedure to check on you and address any questions or concerns that you may have regarding the information given to you following your procedure. If we do not reach you, we will leave a message.  However, if you are feeling well and you are not experiencing any problems, there is no need to return our call.  We will assume that you have returned to your regular daily activities without incident.  If any biopsies were taken you will be contacted by phone or by letter within the next 1-3 weeks.  Please call us at [phone] if you have not heard about the biopsies in 3 weeks.   Await for biopsy results to determine next repeat Colonoscopy screening Polyps (handout given) Hemorrhoids (handout given) Diverticulosis (handout given)   SIGNATURES/CONFIDENTIALITY: You and/or your care partner have signed paperwork which will be entered into your electronic medical record.  These signatures attest to the fact that that the information above on your After Visit Summary has been reviewed and is understood.  Full responsibility of the confidentiality of this discharge information lies with you and/or your care-partner.

## 2017-05-22 NOTE — Progress Notes (Signed)
Called to room to assist during endoscopic procedure.  Patient ID and intended procedure confirmed with present staff. Received instructions for my participation in the procedure from the performing physician.  

## 2017-05-22 NOTE — Progress Notes (Signed)
Report given to PACU, vss 

## 2017-05-23 ENCOUNTER — Telehealth: Payer: Self-pay

## 2017-05-23 NOTE — Telephone Encounter (Signed)
  Follow up Call-  Call Shreya Lacasse number 05/22/2017  Post procedure Call Kameisha Malicki phone  # [phone]  Permission to leave phone message Yes  Some recent data might be hidden     Patient questions:  Do you have a fever, pain , or abdominal swelling? No. Pain Score  0 *  Have you tolerated food without any problems? Yes.    Have you been able to return to your normal activities? Yes.    Do you have any questions about your discharge instructions: Diet   No. Medications  No. Follow up visit  No.  Do you have questions or concerns about your Care? No.  Actions: * If pain score is 4 or above: No action needed, pain <4.

## 2017-05-26 ENCOUNTER — Encounter: Payer: Self-pay | Admitting: Gastroenterology

## 2017-06-03 ENCOUNTER — Other Ambulatory Visit: Payer: Self-pay | Admitting: Neurology

## 2017-06-05 LAB — PSA: PSA: 7.64

## 2017-06-26 ENCOUNTER — Encounter: Payer: Self-pay | Admitting: Gastroenterology

## 2017-07-03 ENCOUNTER — Emergency Department (HOSPITAL_COMMUNITY): Payer: Medicare Other

## 2017-07-03 ENCOUNTER — Observation Stay (HOSPITAL_COMMUNITY)
Admission: EM | Admit: 2017-07-03 | Discharge: 2017-07-04 | Payer: Medicare Other | Attending: Internal Medicine | Admitting: Internal Medicine

## 2017-07-03 ENCOUNTER — Encounter (HOSPITAL_COMMUNITY): Payer: Self-pay | Admitting: Family Medicine

## 2017-07-03 ENCOUNTER — Other Ambulatory Visit: Payer: Self-pay

## 2017-07-03 DIAGNOSIS — R51 Headache: Secondary | ICD-10-CM | POA: Diagnosis not present

## 2017-07-03 DIAGNOSIS — R4701 Aphasia: Secondary | ICD-10-CM | POA: Diagnosis not present

## 2017-07-03 DIAGNOSIS — E782 Mixed hyperlipidemia: Secondary | ICD-10-CM | POA: Diagnosis present

## 2017-07-03 DIAGNOSIS — G43009 Migraine without aura, not intractable, without status migrainosus: Secondary | ICD-10-CM

## 2017-07-03 DIAGNOSIS — Z8546 Personal history of malignant neoplasm of prostate: Secondary | ICD-10-CM | POA: Insufficient documentation

## 2017-07-03 DIAGNOSIS — G459 Transient cerebral ischemic attack, unspecified: Secondary | ICD-10-CM

## 2017-07-03 DIAGNOSIS — Z85828 Personal history of other malignant neoplasm of skin: Secondary | ICD-10-CM | POA: Insufficient documentation

## 2017-07-03 DIAGNOSIS — R001 Bradycardia, unspecified: Secondary | ICD-10-CM

## 2017-07-03 DIAGNOSIS — R479 Unspecified speech disturbances: Secondary | ICD-10-CM | POA: Diagnosis present

## 2017-07-03 DIAGNOSIS — F909 Attention-deficit hyperactivity disorder, unspecified type: Secondary | ICD-10-CM | POA: Insufficient documentation

## 2017-07-03 DIAGNOSIS — Z79899 Other long term (current) drug therapy: Secondary | ICD-10-CM | POA: Diagnosis not present

## 2017-07-03 DIAGNOSIS — F988 Other specified behavioral and emotional disorders with onset usually occurring in childhood and adolescence: Secondary | ICD-10-CM | POA: Diagnosis present

## 2017-07-03 DIAGNOSIS — R519 Headache, unspecified: Secondary | ICD-10-CM

## 2017-07-03 DIAGNOSIS — H6980 Other specified disorders of Eustachian tube, unspecified ear: Secondary | ICD-10-CM | POA: Diagnosis present

## 2017-07-03 DIAGNOSIS — Z85820 Personal history of malignant melanoma of skin: Secondary | ICD-10-CM | POA: Insufficient documentation

## 2017-07-03 LAB — I-STAT CHEM 8, ED
BUN: 14 mg/dL (ref 6–20)
CHLORIDE: 105 mmol/L (ref 101–111)
CREATININE: 1 mg/dL (ref 0.61–1.24)
Calcium, Ion: 1.19 mmol/L (ref 1.15–1.40)
GLUCOSE: 96 mg/dL (ref 65–99)
HCT: 39 % (ref 39.0–52.0)
HEMOGLOBIN: 13.3 g/dL (ref 13.0–17.0)
POTASSIUM: 4.1 mmol/L (ref 3.5–5.1)
Sodium: 141 mmol/L (ref 135–145)
TCO2: 27 mmol/L (ref 22–32)

## 2017-07-03 LAB — COMPREHENSIVE METABOLIC PANEL
ALBUMIN: 4.3 g/dL (ref 3.5–5.0)
ALT: 28 U/L (ref 17–63)
ANION GAP: 8 (ref 5–15)
AST: 26 U/L (ref 15–41)
Alkaline Phosphatase: 64 U/L (ref 38–126)
BILIRUBIN TOTAL: 0.9 mg/dL (ref 0.3–1.2)
BUN: 15 mg/dL (ref 6–20)
CHLORIDE: 106 mmol/L (ref 101–111)
CO2: 26 mmol/L (ref 22–32)
Calcium: 9.2 mg/dL (ref 8.9–10.3)
Creatinine, Ser: 1.01 mg/dL (ref 0.61–1.24)
GFR calc Af Amer: >60 mL/min (ref >60)
GFR calc non Af Amer: >60 mL/min (ref >60)
GLUCOSE: 97 mg/dL (ref 65–99)
POTASSIUM: 4.2 mmol/L (ref 3.5–5.1)
Sodium: 140 mmol/L (ref 135–145)
TOTAL PROTEIN: 6.8 g/dL (ref 6.5–8.1)

## 2017-07-03 LAB — DIFFERENTIAL
BASOS ABS: 0 10*3/uL (ref 0.0–0.1)
BASOS PCT: 0 %
EOS ABS: 0.1 10*3/uL (ref 0.0–0.7)
EOS PCT: 2 %
LYMPHS ABS: 1.9 10*3/uL (ref 0.7–4.0)
Lymphocytes Relative: 33 %
Monocytes Absolute: 0.8 10*3/uL (ref 0.1–1.0)
Monocytes Relative: 14 %
NEUTROS PCT: 51 %
Neutro Abs: 2.9 10*3/uL (ref 1.7–7.7)

## 2017-07-03 LAB — CBC
HEMATOCRIT: 38.8 % — AB (ref 39.0–52.0)
HEMOGLOBIN: 13.2 g/dL (ref 13.0–17.0)
MCH: 29.7 pg (ref 26.0–34.0)
MCHC: 34 g/dL (ref 30.0–36.0)
MCV: 87.4 fL (ref 78.0–100.0)
PLATELETS: 187 10*3/uL (ref 150–400)
RBC: 4.44 MIL/uL (ref 4.22–5.81)
RDW: 13.3 % (ref 11.5–15.5)
WBC: 5.6 10*3/uL (ref 4.0–10.5)

## 2017-07-03 LAB — URINALYSIS, ROUTINE W REFLEX MICROSCOPIC
Bilirubin Urine: NEGATIVE
GLUCOSE, UA: NEGATIVE mg/dL
Hgb urine dipstick: NEGATIVE
Ketones, ur: NEGATIVE mg/dL
LEUKOCYTES UA: NEGATIVE
Nitrite: NEGATIVE
PH: 5 (ref 5.0–8.0)
Protein, ur: NEGATIVE mg/dL
Specific Gravity, Urine: 1.004 — ABNORMAL LOW (ref 1.005–1.030)

## 2017-07-03 LAB — ETHANOL: Alcohol, Ethyl (B): <10 mg/dL (ref <10)

## 2017-07-03 LAB — RAPID URINE DRUG SCREEN, HOSP PERFORMED
Amphetamines: NOT DETECTED
BENZODIAZEPINES: NOT DETECTED
Barbiturates: NOT DETECTED
COCAINE: NOT DETECTED
OPIATES: NOT DETECTED
TETRAHYDROCANNABINOL: NOT DETECTED

## 2017-07-03 LAB — APTT: APTT: 30 s (ref 24–36)

## 2017-07-03 LAB — PROTIME-INR
INR: 0.96
Prothrombin Time: 12.7 seconds (ref 11.4–15.2)

## 2017-07-03 LAB — I-STAT TROPONIN, ED: TROPONIN I, POC: 0 ng/mL (ref 0.00–0.08)

## 2017-07-03 MED ORDER — ACETAMINOPHEN 325 MG PO TABS: 650.0000 mg | ORAL_TABLET | ORAL | Status: DC | PRN

## 2017-07-03 MED ORDER — ASPIRIN 325 MG PO TABS
325.0000 mg | ORAL_TABLET | Freq: Every day | ORAL | Status: DC
  Administered 2017-07-03 – 2017-07-04 (×2): 325 mg via ORAL
  Filled 2017-07-03 (×2): qty 1

## 2017-07-03 MED ORDER — STROKE: EARLY STAGES OF RECOVERY BOOK
Freq: Once | Status: AC
  Administered 2017-07-03: 22:00:00

## 2017-07-03 MED ORDER — LORAZEPAM 2 MG/ML IJ SOLN
1.0000 mg | Freq: Once | INTRAMUSCULAR | Status: AC | PRN
  Administered 2017-07-04: 1 mg via INTRAVENOUS
  Filled 2017-07-03: qty 1

## 2017-07-03 MED ORDER — ALPRAZOLAM 0.5 MG PO TABS: 0.5000 mg | ORAL_TABLET | Freq: Two times a day (BID) | ORAL | Status: DC | PRN

## 2017-07-03 MED ORDER — ACETAMINOPHEN 650 MG RE SUPP: 650.0000 mg | RECTAL | Status: DC | PRN

## 2017-07-03 MED ORDER — ASPIRIN 300 MG RE SUPP: 300.0000 mg | Freq: Every day | RECTAL | Status: DC

## 2017-07-03 MED ORDER — ACETAMINOPHEN 500 MG PO TABS
1000.0000 mg | ORAL_TABLET | Freq: Once | ORAL | Status: AC
  Administered 2017-07-03: 1000 mg via ORAL
  Filled 2017-07-03: qty 2

## 2017-07-03 MED ORDER — ACETAMINOPHEN 160 MG/5ML PO SOLN: 650.0000 mg | ORAL | Status: DC | PRN

## 2017-07-03 MED ORDER — ENOXAPARIN SODIUM 40 MG/0.4ML ~~LOC~~ SOLN
40.0000 mg | SUBCUTANEOUS | Status: DC
  Administered 2017-07-03: 40 mg via SUBCUTANEOUS
  Filled 2017-07-03: qty 0.4

## 2017-07-03 MED ORDER — SODIUM CHLORIDE 0.9 % IV SOLN
INTRAVENOUS | Status: AC
  Administered 2017-07-03: 22:00:00 via INTRAVENOUS

## 2017-07-03 MED ORDER — ALPRAZOLAM 0.25 MG PO TABS
0.1250 mg | ORAL_TABLET | Freq: Every evening | ORAL | Status: DC | PRN
Start: 2017-07-03 — End: 2017-07-04
  Administered 2017-07-03: 0.125 mg via ORAL
  Filled 2017-07-03: qty 1

## 2017-07-03 NOTE — ED Provider Notes (Signed)
[facility] DEPT Provider Note   CSN: [id number] Arrival date & time: 07/03/17  1151     History   Chief Complaint Chief Complaint  Patient presents with  . Altered Mental Status    HPI Peter Novak is a 66 y.o. male.  66yo M w/ PMH including HLD who p/w headache and speech problems.  Approximately 15-30 minutes prior to arrival, the patient was bathing his dog and he bent over and then had a relatively sudden onset of left-sided temporal headache.  He then began noticing difficulty communicating and knew what he wanted to say but was having difficulty getting words out according to him and his wife.  He states that he felt like his thoughts were "cloudy".  He tried to write down what he wanted to say but also could not do this.  He denies any visual changes, extremity weakness, numbness, or balance problems.  His speech difficulty resolved just prior to arrival.  He has a dull headache currently but denies any severe pain.  No fevers, vomiting, or recent illness.  He notes that many years ago he had a headache with some similar speech problems but this was a decade ago and has not occurred again.  He denies any history of chronic headaches.  He has followed with a neurologist previously for chronic white matter disease. No h/o HTN, smoking, or T2DM.   The history is provided by the patient.  Altered Mental Status      Past Medical History:  Diagnosis Date  . ADD (attention deficit disorder with hyperactivity)   . Constipation   . Hearing loss   . History of skin cancer    Melanoma X 2  . Hyperlipidemia     Patient Active Problem List   Diagnosis Date Noted  . ADD (attention deficit disorder) 10/02/2014  . Insomnia 10/02/2014  . Erectile dysfunction 10/02/2014  . Osteoarthritis 10/02/2014  . Eustachian tube dysfunction 10/02/2014  . GERD (gastroesophageal reflux disease) 02/12/2013  . Prostate cancer ([place]) 02/12/2013  . Benign neoplasm  of adrenal gland 11/30/2009  . Hyperglycemia 11/30/2009  . History of melanoma 11/30/2009  . HYPERLIPIDEMIA 07/10/2007    Past Surgical History:  Procedure Laterality Date  . CHOLECYSTECTOMY  2005  . COLONOSCOPY  2008   negative  . ELBOW SURGERY  1994  . HERNIA REPAIR  2005  . INNER EAR SURGERY  2006   Incus transposition;oscillculoplasty; Dr Thornell Mule  . INNER EAR SURGERY  2011   Stirrup resection  . SHOULDER ARTHROSCOPY  2004   Dr Mayer Camel  . TONSILLECTOMY AND ADENOIDECTOMY    . VASECTOMY         Home Medications    Prior to Admission medications   Medication Sig Start Date End Date Taking? Authorizing Provider  ALPRAZolam (XANAX) 0.25 MG tablet Take 0.125 mg at bedtime as needed by mouth for anxiety.   Yes [provider]  atorvastatin (LIPITOR) 20 MG tablet TAKE ONE TABLET BY MOUTH DAILY Patient taking differently: take 1 tablet by mouth on Tuesdays and Wednesdays only 06/03/17  Yes Melvenia Beam, MD  Esomeprazole Magnesium (NEXIUM PO) Take 1 tablet daily by mouth.   Yes [provider]  Ibuprofen 200 MG CAPS Take 400 mg daily as needed by mouth (arthritis pain).   Yes [provider]  Multiple Vitamin (MULTIVITAMIN) tablet Take 1 tablet by mouth daily.   Yes [provider]  Polyethylene Glycol 3350 (MIRALAX PO) Take 1 Dose by mouth  as needed. Patient will take a dose before colon prep day   Yes [provider]  tadalafil (CIALIS) 20 MG tablet Take 1 tablet (20 mg total) by mouth as needed. one every 3 days as needed 10/02/14  Yes Marin Olp, MD  ALPRAZolam Duanne Moron) 0.5 MG tablet Take 1 tablet (0.5 mg total) by mouth 2 (two) times daily as needed for anxiety. 02/10/16   Melvenia Beam, MD  omeprazole (PRILOSEC) 20 MG capsule Take 1 capsule (20 mg total) by mouth daily. Daily as needed Patient not taking: Reported on 07/03/2017 03/22/11   Hendricks Limes, MD    Family History Family History  Problem Relation Age of Onset    . Diverticulosis Mother   . Diabetes Father        AAA  . Sudden death Brother 27       CAD; MI @ 65, smoker, sedentary, overweight, diabetes, HLD  . Diabetes Brother   . Hyperlipidemia Brother   . Colon cancer Maternal Aunt 82  . Heart attack Paternal Grandfather 71  . Stroke Neg Hx   . Neuropathy Neg Hx   . Multiple sclerosis Neg Hx   . Migraines Neg Hx   . Dementia Neg Hx     Social History Social History   Tobacco Use  . Smoking status: Never Smoker  . Smokeless tobacco: Never Used  Substance Use Topics  . Alcohol use: Yes    Alcohol/week: 2.4 oz    Types: 2 Glasses of wine, 2 Cans of beer per week    Comment: 4-5 drinks per week per pt  . Drug use: No     Allergies   Patient has no known allergies.   Review of Systems Review of Systems All other systems reviewed and are negative except that which was mentioned in HPI   Physical Exam Updated Vital Signs BP (!) 144/80   Pulse (!) 52   Temp 98.3 F (36.8 C) (Oral)   Resp 16   Ht 6\' 3"  (1.905 m)   Wt 97.5 kg (215 lb)   SpO2 97%   BMI 26.87 kg/m   Physical Exam  Constitutional: He is oriented to person, place, and time. He appears well-developed and well-nourished. No distress.  Awake, alert  HENT:  Head: Normocephalic and atraumatic.  Eyes: Conjunctivae and EOM are normal. Pupils are equal, round, and reactive to light.  Neck: Neck supple.  Cardiovascular: Normal rate, regular rhythm and normal heart sounds.  No murmur heard. Pulmonary/Chest: Effort normal and breath sounds normal. No respiratory distress.  Abdominal: Soft. Bowel sounds are normal. He exhibits no distension. There is no tenderness.  Musculoskeletal: He exhibits no edema.  Neurological: He is alert and oriented to person, place, and time. He has normal reflexes. No cranial nerve deficit. He exhibits normal muscle tone.  Fluent speech, normal finger-to-nose testing, negative pronator drift, no clonus 5/5 strength and normal  sensation x all 4 extremities  Skin: Skin is warm and dry.  Psychiatric: He has a normal mood and affect. Judgment and thought content normal.  Nursing note and vitals reviewed.    ED Treatments / Results  Labs (all labs ordered are listed, but only abnormal results are displayed) Labs Reviewed  CBC - Abnormal; Notable for the following components:      Result Value   HCT 38.8 (*)    All other components within normal limits  URINALYSIS, ROUTINE W REFLEX MICROSCOPIC - Abnormal; Notable for the following components:  Color, Urine STRAW (*)    Specific Gravity, Urine 1.004 (*)    All other components within normal limits  ETHANOL  PROTIME-INR  APTT  DIFFERENTIAL  COMPREHENSIVE METABOLIC PANEL  RAPID URINE DRUG SCREEN, HOSP PERFORMED  I-STAT CHEM 8, ED  I-STAT TROPONIN, ED    EKG  EKG Interpretation  Date/Time:  Monday July 03 2017 12:33:35 EST Ventricular Rate:  49 PR Interval:    QRS Duration: 96 QT Interval:  443 QTC Calculation: 400 R Axis:   50 Text Interpretation:  Sinus bradycardia No significant change since last tracing Confirmed by Theotis Burrow [id number] on 07/03/2017 1:44:46 PM       Radiology Ct Head Wo Contrast  Result Date: 07/03/2017 CLINICAL DATA:  Left temporal region headache with dysarthria and altered mental status EXAM: CT HEAD WITHOUT CONTRAST TECHNIQUE: Contiguous axial images were obtained from the base of the skull through the vertex without intravenous contrast. COMPARISON:  Head CT Jan 18, 2016; brain MRI June 29, 2016 FINDINGS: Brain: The ventricles are normal in size and configuration. There is no intracranial mass, hemorrhage, extra-axial fluid collection, or midline shift. There is extensive decreased attenuation in the centra semiovale bilaterally, similar to prior studies. No acute infarct is demonstrable. Vascular: There is no appreciable hyperdense vessel. There is no appreciable arterial vascular calcification. Skull: Bony  calvarium appears intact. Sinuses/Orbits: Visualized paranasal sinuses are clear. Orbits appear symmetric bilaterally. Other: Visualized mastoid air cells are clear. 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 A degree of underlying demyelination cannot be excluded. No new gray-white compartment lesion. No acute infarct appreciable. No intracranial mass, hemorrhage, or extra-axial fluid collection. Electronically Signed   By: Lowella Grip III M.D.   On: 07/03/2017 14:26    Procedures Procedures (including critical care time)  Medications Ordered in ED Medications  acetaminophen (TYLENOL) tablet 1,000 mg (1,000 mg Oral Given 07/03/17 1320)     Initial Impression / Assessment and Plan / ED Course  I have reviewed the triage vital signs and the nursing notes.  Pertinent labs & imaging results that were available during my care of the patient were reviewed by me and considered in my medical decision making (see chart for details).     Pt w/ sudden onset headache and difficulty with speech, resolved PTA. He was ambulatory and well appearing on exam.  Normal neurologic exam.  Obtained above lab work as well as head CT.  Lab work unremarkable.  Head CT redemonstrates widespread white matter disease, likely small vessel disease that has been documented on previous head imaging. I discussed at length with Dr. Lorraine Lax, neurologist, who ultimately recommended admission for TIA work up given his evidence of white matter disease and h/o HLD. Discussed admission with Dr. Maryland Pink. Pt will be transferred to [facility] for TIA w/u and neurology evaluation.   Final Clinical Impressions(s) / ED Diagnoses   Final diagnoses:  Aphasia  Acute nonintractable headache, unspecified headache type    ED Discharge Orders    None       Sasha Rogel, Wenda Overland, MD 07/03/17 [phone]

## 2017-07-03 NOTE — ED Notes (Signed)
Patient transported to CT 

## 2017-07-03 NOTE — ED Notes (Signed)
Report called to [facility], [id number]. [facility] called for transport. Patient and family updated

## 2017-07-03 NOTE — H&P (Signed)
[facility] History and Physical  Peter Novak ASN:[id number] DOB: 02/28/1951 DOA: 07/03/2017   PCP: Marin Olp, MD  Specialists: Dr. Jaynee Eagles is his neurologist.  Dr. Elwyn Reach is his ENT specialist  Chief Complaint: Difficulty with speech  HPI: Peter Novak is a 66 y.o. male with past medical history of ADD, hyperlipidemia, history of white matter disease for which he has been seen by neurology previously was in his usual state of health until earlier today when he was bathing his dog and then had sudden onset of left-sided temporal headache.  He did not have any focal weakness or vision impairment.  He went on to take his own shower.  His wife returned from an errand and they were talking when he realized that he was not able to communicate to her what he was thinking.  He could not speak the words.  He wanted to write it down but could not do so.  Denies any balance problems.  No tingling or numbness.  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  But has not had similar episodes since then.  Patient's symptoms got worse according to his wife.  They decided to come into this hospital.  On the drive into the emergency department the symptoms started getting better.  He feels like he is back to baseline now.  Headache has improved although it persists.  In the emergency department patient underwent blood work and CT scan which did not show any acute changes.  Discussions were held with neurology who recommended transfer to [facility] for further workup.  Home Medications: Prior to Admission medications   Medication Sig Start Date End Date Taking? Authorizing Provider  ALPRAZolam (XANAX) 0.25 MG tablet Take 0.125 mg at bedtime as needed by mouth for anxiety.   Yes [provider]  atorvastatin (LIPITOR) 20 MG tablet TAKE ONE TABLET BY MOUTH DAILY Patient taking differently: take 1 tablet by mouth on Tuesdays and  Wednesdays only 06/03/17  Yes Melvenia Beam, MD  Esomeprazole Magnesium (NEXIUM PO) Take 1 tablet daily by mouth.   Yes [provider]  Ibuprofen 200 MG CAPS Take 400 mg daily as needed by mouth (arthritis pain).   Yes [provider]  Multiple Vitamin (MULTIVITAMIN) tablet Take 1 tablet by mouth daily.   Yes [provider]  Polyethylene Glycol 3350 (MIRALAX PO) Take 1 Dose by mouth as needed. Patient will take a dose before colon prep day   Yes [provider]  tadalafil (CIALIS) 20 MG tablet Take 1 tablet (20 mg total) by mouth as needed. one every 3 days as needed 10/02/14  Yes Marin Olp, MD  ALPRAZolam Duanne Moron) 0.5 MG tablet Take 1 tablet (0.5 mg total) by mouth 2 (two) times daily as needed for anxiety. 02/10/16   Melvenia Beam, MD  omeprazole (PRILOSEC) 20 MG capsule Take 1 capsule (20 mg total) by mouth daily. Daily as needed Patient not taking: Reported on 07/03/2017 03/22/11   Hendricks Limes, MD    Allergies: No Known Allergies  Past Medical History: Past Medical History:  Diagnosis Date  . ADD (attention deficit disorder with hyperactivity)   . Constipation   . Hearing loss   . History of skin cancer    Melanoma X 2  . Hyperlipidemia     Past Surgical History:  Procedure Laterality Date  . CHOLECYSTECTOMY  2005  . COLONOSCOPY  2008  negative  . ELBOW SURGERY  1994  . HERNIA REPAIR  2005  . INNER EAR SURGERY  2006   Incus transposition;oscillculoplasty; Dr Thornell Mule  . INNER EAR SURGERY  2011   Stirrup resection  . SHOULDER ARTHROSCOPY  2004   Dr Mayer Camel  . TONSILLECTOMY AND ADENOIDECTOMY    . VASECTOMY      Social History: Patient lives with his wife.  No history of smoking.  Very occasional alcohol use.  No illicit drug use.  Usually independent with daily activities.  Family History:  Family History  Problem Relation Age of Onset  . Diverticulosis Mother   . Diabetes Father        AAA  . Sudden death Brother 39        CAD; MI @ 44, smoker, sedentary, overweight, diabetes, HLD  . Diabetes Brother   . Hyperlipidemia Brother   . Colon cancer Maternal Aunt 82  . Heart attack Paternal Grandfather 7  . Stroke Neg Hx   . Neuropathy Neg Hx   . Multiple sclerosis Neg Hx   . Migraines Neg Hx   . Dementia Neg Hx      Review of Systems - History obtained from the patient General ROS: negative Psychological ROS: negative Ophthalmic ROS: negative ENT ROS: History of hearing impairment and with ear prosthesis Allergy and Immunology ROS: negative Hematological and Lymphatic ROS: negative Endocrine ROS: negative Respiratory ROS: no cough, shortness of breath, or wheezing Cardiovascular ROS: no chest pain or dyspnea on exertion Gastrointestinal ROS: no abdominal pain, change in bowel habits, or black or bloody stools Genito-Urinary ROS: no dysuria, trouble voiding, or hematuria Musculoskeletal ROS: negative Neurological ROS: As in HPI Dermatological ROS: negative  Physical Examination  Vitals:   07/03/17 1313 07/03/17 1314 07/03/17 1455 07/03/17 1456  BP: (!) 141/79 (!) 141/79 (!) 144/80 (!) 144/80  Pulse: (!) 46 (!) 47 (!) 49 (!) 52  Resp: (!) 21 19 18 16   Temp: 98.4 F (36.9 C)  98.3 F (36.8 C)   TempSrc: Oral  Oral   SpO2: 98% 97% 98% 97%  Weight:      Height:        BP (!) 144/80   Pulse (!) 52   Temp 98.3 F (36.8 C) (Oral)   Resp 16   Ht 6\' 3"  (1.905 m)   Wt 97.5 kg (215 lb)   SpO2 97%   BMI 26.87 kg/m   General appearance: alert, cooperative, appears stated age and no distress Head: Normocephalic, without obvious abnormality, atraumatic Eyes: conjunctivae/corneas clear. PERRL, EOM's intact. Throat: lips, mucosa, and tongue normal; teeth and gums normal Neck: no adenopathy, no carotid bruit, no JVD, supple, symmetrical, trachea midline and thyroid not enlarged, symmetric, no tenderness/mass/nodules Resp: clear to auscultation bilaterally Cardio: S1-S2 is bradycardic regular.   No S3-S4.  No rubs murmurs or bruit GI: soft, non-tender; bowel sounds normal; no masses,  no organomegaly Extremities: extremities normal, atraumatic, no cyanosis or edema Pulses: 2+ and symmetric Skin: Skin color, texture, turgor normal. No rashes or lesions Lymph nodes: Cervical, supraclavicular, and axillary nodes normal. Neurologic: Awake alert.  Oriented x3.  Cranial nerves II through XII intact.  Motor strength equal bilateral upper and lower extremities.  No pronator drift.   Labs on Admission: I have personally reviewed following labs and imaging studies  CBC: Recent Labs  Lab 07/03/17 1212 07/03/17 1246  WBC 5.6  --   NEUTROABS 2.9  --   HGB 13.2 13.3  HCT 38.8* 39.0  MCV 87.4  --   PLT 187  --    Basic Metabolic Panel: Recent Labs  Lab 07/03/17 1212 07/03/17 1246  NA 140 141  K 4.2 4.1  CL 106 105  CO2 26  --   GLUCOSE 97 96  BUN 15 14  CREATININE 1.01 1.00  CALCIUM 9.2  --    GFR: Estimated Creatinine Clearance: 85.7 mL/min (by C-G formula based on SCr of 1 mg/dL).  Liver Function Tests: Recent Labs  Lab 07/03/17 1212  AST 26  ALT 28  ALKPHOS 64  BILITOT 0.9  PROT 6.8  ALBUMIN 4.3   Coagulation Profile: Recent Labs  Lab 07/03/17 1212  INR 0.96     Radiological Exams on Admission: Ct Head Wo Contrast  Result Date: 07/03/2017 CLINICAL DATA:  Left temporal region headache with dysarthria and altered mental status EXAM: CT HEAD WITHOUT CONTRAST TECHNIQUE: Contiguous axial images were obtained from the base of the skull through the vertex without intravenous contrast. COMPARISON:  Head CT Jan 18, 2016; brain MRI June 29, 2016 FINDINGS: Brain: The ventricles are normal in size and configuration. There is no intracranial mass, hemorrhage, extra-axial fluid collection, or midline shift. There is extensive decreased attenuation in the centra semiovale bilaterally, similar to prior studies. No acute infarct is demonstrable. Vascular: There is no  appreciable hyperdense vessel. There is no appreciable arterial vascular calcification. Skull: Bony calvarium appears intact. Sinuses/Orbits: Visualized paranasal sinuses are clear. Orbits appear symmetric bilaterally. Other: Visualized mastoid air cells are clear. 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 A degree of underlying demyelination cannot be excluded. No new gray-white compartment lesion. No acute infarct appreciable. No intracranial mass, hemorrhage, or extra-axial fluid collection. Electronically Signed   By: Lowella Grip III M.D.   On: 07/03/2017 14:26    My interpretation of Electrocardiogram: Sinus bradycardia at 50 bpm.  Normal axis.  Intervals are normal.  No concerning ST or T wave changes.   Problem List  Principal Problem:   TIA (transient ischemic attack) Active Problems:   HYPERLIPIDEMIA   ADD (attention deficit disorder)   Eustachian tube dysfunction   Assessment: This is a 66 year old Caucasian male with a past medical history as stated earlier who comes in with episode of left-sided headache along with speech impairment.  The symptoms lasted about 45 minutes or so and then subsided.  Differential diagnosis include TIA, epilepsy, migraine.  Patient however denies any overt seizure activity.  No evidence for any infectious process.  Plan: #1 possible TIA: CT head as above which shows widespread white matter disease which is not new for this patient.  ED provider has discussed with neurology who recommended MRI brain, CT angiogram head and neck.  They recommend transfer to [facility] for further workup.  Echocardiogram will also be ordered.  EEG.  He will be monitored on telemetry.  Patient reports claustrophobia with MRI.  He will be given Ativan.  He has a right ear prosthesis for hearing impairment placed many years ago.  This is apparently MRI compatible.  PT and  OT evaluation.  Aspirin  #2 Sinus bradycardia: Patient reports that his heart rate is usually very slow at rest.  We will check TSH in the morning.  #3  History of hyperlipidemia: Patient is on statin but he does not take it on a daily basis.  States that his cholesterol panel has improved with the weight loss and exercise.  Check lipid panel  in the morning.  #4  History of ADD: Continue with his home medications including alprazolam.   DVT Prophylaxis: Lovenox Code Status: Full code Family Communication: Discussed with the patient and his wife Consults called: Neurology called by ED physician  Severity of Illness: The appropriate patient status for this patient is OBSERVATION. Observation status is judged to be reasonable and necessary in order to provide the required intensity of service to ensure the patient's safety. The patient's presenting symptoms, physical exam findings, and initial radiographic and laboratory data in the context of their medical condition is felt to place them at decreased risk for further clinical deterioration. Furthermore, it is anticipated that the patient will be medically stable for discharge from the hospital within 2 midnights of admission. The following factors support the patient status of observation.   " The patient's presenting symptoms include speech difficulty, headache. " The physical exam findings include no abnormal findings. " The initial radiographic and laboratory data are abnormal CT scan of the head.   Further management decisions will depend on results of further testing and patient's response to treatment.   [facility]  [facility] Pager [phone]  If 7PM-7AM, please contact night-coverage [email or web address] Password [facility]  07/03/2017, 4:46 PM

## 2017-07-03 NOTE — ED Triage Notes (Signed)
Patient reports he bending over the shower, giving his dog a bath, when he suddenly developed a sharp, left temple headache. When his wife came home, he was unable to talk and express his thoughts. He reports his thoughts were unclear but his thoughts he did have, he was unable to talk or write out his thoughts. Patient reports he is starting to feel better. Patient is alert, oriented x 4, and ambulatory to the triage room.

## 2017-07-03 NOTE — ED Notes (Signed)
[facility] here to transport patient. 

## 2017-07-04 ENCOUNTER — Observation Stay (HOSPITAL_BASED_OUTPATIENT_CLINIC_OR_DEPARTMENT_OTHER): Payer: Medicare Other

## 2017-07-04 ENCOUNTER — Observation Stay (HOSPITAL_COMMUNITY): Payer: Medicare Other

## 2017-07-04 DIAGNOSIS — G459 Transient cerebral ischemic attack, unspecified: Secondary | ICD-10-CM

## 2017-07-04 DIAGNOSIS — G43109 Migraine with aura, not intractable, without status migrainosus: Secondary | ICD-10-CM | POA: Diagnosis not present

## 2017-07-04 DIAGNOSIS — R4701 Aphasia: Secondary | ICD-10-CM | POA: Diagnosis not present

## 2017-07-04 DIAGNOSIS — E782 Mixed hyperlipidemia: Secondary | ICD-10-CM

## 2017-07-04 LAB — ECHOCARDIOGRAM COMPLETE
HEIGHTINCHES: 75 in
Weight: 3440 oz

## 2017-07-04 LAB — COMPREHENSIVE METABOLIC PANEL
ALT: 25 U/L (ref 17–63)
ANION GAP: 6 (ref 5–15)
AST: 22 U/L (ref 15–41)
Albumin: 3.8 g/dL (ref 3.5–5.0)
Alkaline Phosphatase: 56 U/L (ref 38–126)
BUN: 9 mg/dL (ref 6–20)
CHLORIDE: 106 mmol/L (ref 101–111)
CO2: 28 mmol/L (ref 22–32)
Calcium: 9.2 mg/dL (ref 8.9–10.3)
Creatinine, Ser: 1 mg/dL (ref 0.61–1.24)
GFR calc Af Amer: >60 mL/min (ref >60)
GFR calc non Af Amer: >60 mL/min (ref >60)
GLUCOSE: 110 mg/dL — AB (ref 65–99)
Potassium: 4.4 mmol/L (ref 3.5–5.1)
SODIUM: 140 mmol/L (ref 135–145)
TOTAL PROTEIN: 6.5 g/dL (ref 6.5–8.1)
Total Bilirubin: 1.1 mg/dL (ref 0.3–1.2)

## 2017-07-04 LAB — LIPID PANEL
Cholesterol: 176 mg/dL (ref 0–200)
HDL: 36 mg/dL — AB (ref >40)
LDL CALC: 97 mg/dL (ref 0–99)
TRIGLYCERIDES: 216 mg/dL — AB (ref <150)
Total CHOL/HDL Ratio: 4.9 RATIO
VLDL: 43 mg/dL — ABNORMAL HIGH (ref 0–40)

## 2017-07-04 LAB — CBC
HCT: 39.5 % (ref 39.0–52.0)
HEMOGLOBIN: 12.9 g/dL — AB (ref 13.0–17.0)
MCH: 28.7 pg (ref 26.0–34.0)
MCHC: 32.7 g/dL (ref 30.0–36.0)
MCV: 88 fL (ref 78.0–100.0)
Platelets: 171 10*3/uL (ref 150–400)
RBC: 4.49 MIL/uL (ref 4.22–5.81)
RDW: 13.6 % (ref 11.5–15.5)
WBC: 4.8 10*3/uL (ref 4.0–10.5)

## 2017-07-04 LAB — TSH: TSH: 3.529 u[IU]/mL (ref 0.350–4.500)

## 2017-07-04 MED ORDER — ATORVASTATIN CALCIUM 10 MG PO TABS
20.0000 mg | ORAL_TABLET | Freq: Every day | ORAL | Status: DC
  Filled 2017-07-04: qty 2

## 2017-07-04 MED ORDER — ATORVASTATIN CALCIUM 20 MG PO TABS: ORAL_TABLET | ORAL | 0 refills | Status: DC

## 2017-07-04 MED ORDER — IOPAMIDOL (ISOVUE-370) INJECTION 76%
INTRAVENOUS | Status: AC
  Administered 2017-07-04: 50 mL
  Filled 2017-07-04: qty 50

## 2017-07-04 MED ORDER — ATORVASTATIN CALCIUM 40 MG PO TABS: 40.0000 mg | ORAL_TABLET | Freq: Every day | ORAL | Status: DC

## 2017-07-04 MED ORDER — ATORVASTATIN CALCIUM 10 MG PO TABS: 20.0000 mg | ORAL_TABLET | Freq: Every day | ORAL | Status: DC

## 2017-07-04 MED ORDER — ACETAMINOPHEN 325 MG PO TABS: 650.0000 mg | ORAL_TABLET | ORAL | 0 refills | Status: DC | PRN

## 2017-07-04 MED ORDER — ASPIRIN EC 81 MG PO TBEC: 81.0000 mg | DELAYED_RELEASE_TABLET | Freq: Every day | ORAL | 2 refills | Status: AC

## 2017-07-04 NOTE — Progress Notes (Signed)
EEG completed; results pending.    

## 2017-07-04 NOTE — Progress Notes (Signed)
  Echocardiogram 2D Echocardiogram has been performed.  Jennifr Gaeta T Arseniy Toomey 07/04/2017, 1:04 PM

## 2017-07-04 NOTE — Discharge Summary (Addendum)
Physician Discharge Summary  Peter Novak RKY:[id number] DOB: 06/28/1951 DOA: 07/03/2017  PCP: Marin Olp, MD  Admit date: 07/03/2017 Discharge date: 07/04/2017  Admitted From: Home  Disposition: Home   Recommendations for Outpatient Follow-up:  1. Follow up with PCP in 1-2 weeks 2. Please obtain BMP/CBC in one week 3. Please obtain Hb-A1c.  4. Please refer patient to cardiology or pulmonologist for evaluation of Pulmonary HTN  5. Follow up on patient symptoms after increase Lipitor to every other day.  6. Monitor BP.     Discharge Condition: Stable.  CODE STATUS: Full code.  Diet recommendation: Heart Healthy   Brief/Interim Summary: Assessment: This is a 66 year old Caucasian male with a past medical history as stated earlier who comes in with episode of left-sided headache along with speech impairment.  The symptoms lasted about 45 minutes or so and then subsided.  Differential diagnosis include TIA, epilepsy, migraine.  Patient however denies any overt seizure activity.  No evidence for any infectious process.  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 His lipitor will be change to every other day. Continue with aspirin. Monitor BP out patient. BP currently 130 range.[name] Kitchen He will work on diet.  ECHO no evidence of Blood clot.  Advised to stop taking NSAID.  He was taking statins twice a week. After he was seen by Dr Erlinda Hong, they agreed on statins every other day. If patient gets adversed effect , he will need to follow up with his PCP for others options.   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   Discharge Diagnoses:  Principal Problem:   TIA (transient ischemic attack) Active Problems:   HYPERLIPIDEMIA   ADD (attention deficit disorder)   Eustachian tube  dysfunction    Discharge Instructions  Discharge Instructions    Ambulatory referral to Neurology   Complete by:  As directed    An appointment is requested in approximately: 6 weeks with Dr. Jaynee Eagles   Diet - low sodium heart healthy   Complete by:  As directed    Increase activity slowly   Complete by:  As directed      Allergies as of 07/04/2017   No Known Allergies     Medication List    STOP taking these medications   Ibuprofen 200 MG Caps   omeprazole 20 MG capsule Commonly known as:  PRILOSEC   tadalafil 20 MG tablet Commonly known as:  CIALIS     TAKE these medications   acetaminophen 325 MG tablet Commonly known as:  TYLENOL Take 2 tablets (650 mg total) every 4 (four) hours as needed by mouth for moderate pain (or temp > 37.5 C (99.5 F)).   ALPRAZolam 0.25 MG tablet Commonly known as:  XANAX Take 0.125 mg at bedtime as needed by mouth for anxiety.   ALPRAZolam 0.5 MG tablet Commonly known as:  XANAX Take 1 tablet (0.5 mg total) by mouth 2 (two) times daily as needed for anxiety.   aspirin EC 81 MG tablet Take 1 tablet (81 mg total) daily by mouth.   atorvastatin 20 MG tablet Commonly known as:  LIPITOR Take one tablet every other day. What changed:    how much to take  how to take this  when to take this  additional instructions   MIRALAX PO Take 1 Dose by mouth as needed.  Patient will take a dose before colon prep day   multivitamin tablet Take 1 tablet by mouth daily.   NEXIUM PO Take 1 tablet daily by mouth.      Follow-up Information    Melvenia Beam, MD. Schedule an appointment as soon as possible for a visit in 6 week(s).   Specialty:  Neurology Contact information: [address] [place] [address] [phone]        Marin Olp, MD Follow up in 3 day(s).   Specialty:  Family Medicine Contact information: [address] [place] [address] [phone]          No Known  Allergies  Consultations:  Neurology    Procedures/Studies: Ct Angio Head W Or Wo Contrast  Result Date: 07/04/2017 CLINICAL DATA:  Transient ischemic attack. History of hyperlipidemia, melanoma, prostate cancer. EXAM: CT ANGIOGRAPHY HEAD AND NECK TECHNIQUE: Multidetector CT imaging of the head and neck was performed using the standard protocol during bolus administration of intravenous contrast. Multiplanar CT image reconstructions and MIPs were obtained to evaluate the vascular anatomy. Carotid stenosis measurements (when applicable) are obtained utilizing NASCET criteria, using the distal internal carotid diameter as the denominator. CONTRAST:  50 cc Isovue 370 COMPARISON:  MRI of the head June 29, 2016 and CT HEAD July 03, 2017 and CTA HEAD and neck Jan 18, 2016. FINDINGS: CT HEAD FINDINGS BRAIN: No intraparenchymal hemorrhage, mass effect nor midline shift. The ventricles and sulci are normal for age. Patchy supratentorial white matter hypodensities predominately in peripheral distribution relatively unchanged. No acute large vascular territory infarcts. No abnormal extra-axial fluid collections. Basal cisterns are patent. VASCULAR: Unremarkable. SKULL: No skull fracture. No significant scalp soft tissue swelling. SINUSES/ORBITS: Trace paranasal sinus mucosal thickening without air-fluid levels. Mastoid air cells are well aerated. The included ocular globes and orbital contents are non-suspicious. OTHER: None. CTA NECK AORTIC ARCH: Normal appearance of the thoracic arch, normal branch pattern. The origins of the innominate, left Common carotid artery and subclavian artery are widely patent. RIGHT CAROTID SYSTEM: Common carotid artery is widely patent, coursing in a straight line fashion. Normal appearance of the carotid bifurcation without hemodynamically significant stenosis by NASCET criteria. Normal appearance of the internal carotid artery. LEFT CAROTID SYSTEM: Common carotid artery is  widely patent, coursing in a straight line fashion. Normal appearance of the carotid bifurcation without hemodynamically significant stenosis by NASCET criteria. Normal appearance of the internal carotid artery. VERTEBRAL ARTERIES:RIGHT vertebral artery is dominant. Streak artifact limiting assessment of RIGHT V1. SKELETON: No acute osseous process though bone windows have not been submitted. Stable geographic sclerotic lesion T4, possible bone island versus stable prostate metastasis. Mild degenerative changes cervical spine. OTHER NECK: Soft tissues of the neck are nonacute though, not tailored for evaluation. Mildly heterogeneous thyroid without dominant nodule. Atrophic RIGHT submandibular gland. UPPER CHEST: Included lung apices are clear. No superior mediastinal lymphadenopathy. CTA HEAD ANTERIOR CIRCULATION: Patent cervical internal carotid arteries, petrous, cavernous and supra clinoid internal carotid arteries. Patent anterior communicating artery. Unchanged widely fenestrated LEFT A1 segment. Patent anterior and middle cerebral arteries, mild luminal irregularity compatible with atherosclerosis. No large vessel occlusion, significant stenosis, contrast extravasation or aneurysm. POSTERIOR CIRCULATION: Patent vertebral arteries, vertebrobasilar junction and basilar artery, as well as main branch vessels. Patent posterior cerebral arteries, mild luminal irregularity compatible with atherosclerosis. Small bilateral posterior communicating artery's present. No large vessel occlusion, significant stenosis, contrast extravasation or aneurysm. VENOUS SINUSES: Major dural venous sinuses are patent though not tailored for  evaluation on this angiographic examination. ANATOMIC VARIANTS: None. DELAYED PHASE: Not performed. MIP images reviewed. IMPRESSION: CT HEAD: 1. No acute intracranial process. 2. Stable moderate to severe white matter changes most compatible with chronic small vessel ischemic disease. CTA NECK: 1.  Negative CT angiogram of the neck. CTA HEAD: 1. No emergent large vessel occlusion or significant stenosis. 2. Mild intracranial atherosclerosis. Electronically Signed   By: Elon Alas M.D.   On: 07/04/2017 02:25   Ct Head Wo Contrast  Result Date: 07/03/2017 CLINICAL DATA:  Left temporal region headache with dysarthria and altered mental status EXAM: CT HEAD WITHOUT CONTRAST TECHNIQUE: Contiguous axial images were obtained from the base of the skull through the vertex without intravenous contrast. COMPARISON:  Head CT Jan 18, 2016; brain MRI June 29, 2016 FINDINGS: Brain: The ventricles are normal in size and configuration. There is no intracranial mass, hemorrhage, extra-axial fluid collection, or midline shift. There is extensive decreased attenuation in the centra semiovale bilaterally, similar to prior studies. No acute infarct is demonstrable. Vascular: There is no appreciable hyperdense vessel. There is no appreciable arterial vascular calcification. Skull: Bony calvarium appears intact. Sinuses/Orbits: Visualized paranasal sinuses are clear. Orbits appear symmetric bilaterally. Other: Visualized mastoid air cells are clear. 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 A degree of underlying demyelination cannot be excluded. No new gray-white compartment lesion. No acute infarct appreciable. No intracranial mass, hemorrhage, or extra-axial fluid collection. Electronically Signed   By: Lowella Grip III M.D.   On: 07/03/2017 14:26   Ct Angio Neck W Or Wo Contrast  Result Date: 07/04/2017 CLINICAL DATA:  Transient ischemic attack. History of hyperlipidemia, melanoma, prostate cancer. EXAM: CT ANGIOGRAPHY HEAD AND NECK TECHNIQUE: Multidetector CT imaging of the head and neck was performed using the standard protocol during bolus administration of intravenous contrast. Multiplanar CT image  reconstructions and MIPs were obtained to evaluate the vascular anatomy. Carotid stenosis measurements (when applicable) are obtained utilizing NASCET criteria, using the distal internal carotid diameter as the denominator. CONTRAST:  50 cc Isovue 370 COMPARISON:  MRI of the head June 29, 2016 and CT HEAD July 03, 2017 and CTA HEAD and neck Jan 18, 2016. FINDINGS: CT HEAD FINDINGS BRAIN: No intraparenchymal hemorrhage, mass effect nor midline shift. The ventricles and sulci are normal for age. Patchy supratentorial white matter hypodensities predominately in peripheral distribution relatively unchanged. No acute large vascular territory infarcts. No abnormal extra-axial fluid collections. Basal cisterns are patent. VASCULAR: Unremarkable. SKULL: No skull fracture. No significant scalp soft tissue swelling. SINUSES/ORBITS: Trace paranasal sinus mucosal thickening without air-fluid levels. Mastoid air cells are well aerated. The included ocular globes and orbital contents are non-suspicious. OTHER: None. CTA NECK AORTIC ARCH: Normal appearance of the thoracic arch, normal branch pattern. The origins of the innominate, left Common carotid artery and subclavian artery are widely patent. RIGHT CAROTID SYSTEM: Common carotid artery is widely patent, coursing in a straight line fashion. Normal appearance of the carotid bifurcation without hemodynamically significant stenosis by NASCET criteria. Normal appearance of the internal carotid artery. LEFT CAROTID SYSTEM: Common carotid artery is widely patent, coursing in a straight line fashion. Normal appearance of the carotid bifurcation without hemodynamically significant stenosis by NASCET criteria. Normal appearance of the internal carotid artery. VERTEBRAL ARTERIES:RIGHT vertebral artery is dominant. Streak artifact limiting assessment of RIGHT V1. SKELETON: No acute osseous process though bone windows have not been submitted. Stable geographic sclerotic lesion T4,  possible bone island versus  stable prostate metastasis. Mild degenerative changes cervical spine. OTHER NECK: Soft tissues of the neck are nonacute though, not tailored for evaluation. Mildly heterogeneous thyroid without dominant nodule. Atrophic RIGHT submandibular gland. UPPER CHEST: Included lung apices are clear. No superior mediastinal lymphadenopathy. CTA HEAD ANTERIOR CIRCULATION: Patent cervical internal carotid arteries, petrous, cavernous and supra clinoid internal carotid arteries. Patent anterior communicating artery. Unchanged widely fenestrated LEFT A1 segment. Patent anterior and middle cerebral arteries, mild luminal irregularity compatible with atherosclerosis. No large vessel occlusion, significant stenosis, contrast extravasation or aneurysm. POSTERIOR CIRCULATION: Patent vertebral arteries, vertebrobasilar junction and basilar artery, as well as main branch vessels. Patent posterior cerebral arteries, mild luminal irregularity compatible with atherosclerosis. Small bilateral posterior communicating artery's present. No large vessel occlusion, significant stenosis, contrast extravasation or aneurysm. VENOUS SINUSES: Major dural venous sinuses are patent though not tailored for evaluation on this angiographic examination. ANATOMIC VARIANTS: None. DELAYED PHASE: Not performed. MIP images reviewed. IMPRESSION: CT HEAD: 1. No acute intracranial process. 2. Stable moderate to severe white matter changes most compatible with chronic small vessel ischemic disease. CTA NECK: 1. Negative CT angiogram of the neck. CTA HEAD: 1. No emergent large vessel occlusion or significant stenosis. 2. Mild intracranial atherosclerosis. Electronically Signed   By: Elon Alas M.D.   On: 07/04/2017 02:25   Mr Brain Wo Contrast  Result Date: 07/04/2017 CLINICAL DATA:  Acute onset LEFT temporal headaches while washing his dog. Subsequent aphasia, now at baseline. EXAM: MRI HEAD WITHOUT CONTRAST TECHNIQUE:  Multiplanar, multiecho pulse sequences of the brain and surrounding structures were obtained without intravenous contrast. COMPARISON:  CT HEAD July 03, 2001 18 and MRI of the head June 29, 2006 FINDINGS: BRAIN: No reduced diffusion to suggest acute ischemia or hyperacute demyelination. No susceptibility artifact to suggest hemorrhage. The ventricles and sulci are normal for patient's age. Patchy to confluent supratentorial white matter FLAIR T2 hyperintensities including LEFT greater than RIGHT temporal lobe again seen, unchanged from prior MRI. No midline shift, mass effect or masses. VASCULAR: Normal major intracranial vascular flow voids present at skull base. SKULL AND UPPER CERVICAL SPINE: No abnormal sellar expansion. No suspicious calvarial bone marrow signal. Craniocervical junction maintained. SINUSES/ORBITS: The mastoid air-cells and included paranasal sinuses are well-aerated. The included ocular globes and orbital contents are non-suspicious. OTHER: None. IMPRESSION: 1. No acute intracranial process. 2. Stable nonspecific moderate to severe white matter changes. Possible chronic small vessel ischemic disease and a component of chronic demyelination. Electronically Signed   By: Elon Alas M.D.   On: 07/04/2017 04:59   (Echo, Carotid, EGD, Colonoscopy, ERCP)    Subjective:   Discharge Exam: Vitals:   07/03/17 2049 07/04/17 1448  BP: (!) 151/79 134/77  Pulse: 60 65  Resp: 18 17  Temp:  98.9 F (37.2 C)  SpO2: 98% 98%   Vitals:   07/03/17 1930 07/03/17 2000 07/03/17 2049 07/04/17 1448  BP: (!) 144/81 (!) 157/87 (!) 151/79 134/77  Pulse: (!) 50 (!) 48 60 65  Resp: 16 18 18 17   Temp:   99 F (37.2 C) 98.9 F (37.2 C)  TempSrc:   Oral Oral  SpO2: 97% 96% 98% 98%  Weight:   97.5 kg (215 lb)   Height:   6\' 3"  (1.905 m)     General: Pt is alert, awake, not in acute distress Cardiovascular: RRR, S1/S2 +, no rubs, no gallops Respiratory: CTA bilaterally, no wheezing,  no rhonchi Abdominal: Soft, NT, ND, bowel sounds + Extremities: no edema, no cyanosis  The results of significant diagnostics from this hospitalization (including imaging, microbiology, ancillary and laboratory) are listed below for reference.     Microbiology: No results found for this or any previous visit (from the past 240 hour(s)).   Labs: BNP (last 3 results) No results for input(s): BNP in the last 8760 hours. Basic Metabolic Panel: Recent Labs  Lab 07/03/17 1212 07/03/17 1246 07/04/17 0515  NA 140 141 140  K 4.2 4.1 4.4  CL 106 105 106  CO2 26  --  28  GLUCOSE 97 96 110*  BUN 15 14 9   CREATININE 1.01 1.00 1.00  CALCIUM 9.2  --  9.2   Liver Function Tests: Recent Labs  Lab 07/03/17 1212 07/04/17 0515  AST 26 22  ALT 28 25  ALKPHOS 64 56  BILITOT 0.9 1.1  PROT 6.8 6.5  ALBUMIN 4.3 3.8   No results for input(s): LIPASE, AMYLASE in the last 168 hours. No results for input(s): AMMONIA in the last 168 hours. CBC: Recent Labs  Lab 07/03/17 1212 07/03/17 1246 07/04/17 0515  WBC 5.6  --  4.8  NEUTROABS 2.9  --   --   HGB 13.2 13.3 12.9*  HCT 38.8* 39.0 39.5  MCV 87.4  --  88.0  PLT 187  --  171   Cardiac Enzymes: No results for input(s): CKTOTAL, CKMB, CKMBINDEX, TROPONINI in the last 168 hours. BNP: Invalid input(s): POCBNP CBG: No results for input(s): GLUCAP in the last 168 hours. D-Dimer No results for input(s): DDIMER in the last 72 hours. Hgb A1c No results for input(s): HGBA1C in the last 72 hours. Lipid Profile Recent Labs    07/04/17 0515  CHOL 176  HDL 36*  LDLCALC 97  TRIG 216*  CHOLHDL 4.9   Thyroid function studies Recent Labs    07/04/17 0515  TSH 3.529   Anemia work up No results for input(s): VITAMINB12, FOLATE, FERRITIN, TIBC, IRON, RETICCTPCT in the last 72 hours. Urinalysis    Component Value Date/Time   COLORURINE STRAW (A) 07/03/2017 1313   APPEARANCEUR CLEAR 07/03/2017 1313   LABSPEC 1.004 (L) 07/03/2017  1313   PHURINE 5.0 07/03/2017 1313   GLUCOSEU NEGATIVE 07/03/2017 1313   GLUCOSEU NEGATIVE 12/11/2009 0749   HGBUR NEGATIVE 07/03/2017 1313   BILIRUBINUR NEGATIVE 07/03/2017 1313   KETONESUR NEGATIVE 07/03/2017 1313   PROTEINUR NEGATIVE 07/03/2017 1313   UROBILINOGEN 0.2 12/11/2009 0749   NITRITE NEGATIVE 07/03/2017 1313   LEUKOCYTESUR NEGATIVE 07/03/2017 1313   Sepsis Labs Invalid input(s): PROCALCITONIN,  WBC,  LACTICIDVEN Microbiology No results found for this or any previous visit (from the past 240 hour(s)).   Time coordinating discharge: Over 30 minutes  SIGNED:   Elmarie Shiley, MD  [facility] 07/04/2017, 4:51 PM Pager   If 7PM-7AM, please contact night-coverage [email or web address] Password [email or web address]

## 2017-07-04 NOTE — Evaluation (Addendum)
Physical Therapy Evaluation Patient Details Name: Peter Novak MRN: [id number] DOB: [birth year] Today's Date: 07/04/2017   History of Present Illness  Peter Novak is a 66 y.o. male with past medical history of ADD, hyperlipidemia, history of white matter disease for which he has been seen by neurology previously was in his usual state of health until earlier today when he was bathing his dog and then had sudden onset of left-sided temporal headache.followed by experiencing approximately 10 minutes of word finding difficulty. MRI revealed no acute intracranical process and stable nonspecific historical white matter changes. Given concomitant headache with migrainous features (unilaterality, throbbing quality, severe pain), the most likely etiology for the patient's transient aphasia is felt to be complicated migraine.  Clinical Impression  Patient evaluated by Physical Therapy with no further acute PT needs identified. Pt is independent in all mobility. Pt educated on signs and symptoms of stroke and encouraged to go to the ED if he experiences them. PT is signing off. Thank you for this referral.     Follow Up Recommendations No PT follow up    Equipment Recommendations  None recommended by PT    Recommendations for Other Services       Precautions / Restrictions Precautions Precautions: None Restrictions Weight Bearing Restrictions: No      Mobility  Bed Mobility Overal bed mobility: Modified Independent             General bed mobility comments: HoB elevated and use of bedrail   Transfers Overall transfer level: Independent               General transfer comment: good power up and steadying in standing  Ambulation/Gait Ambulation/Gait assistance: Independent Ambulation Distance (Feet): 200 Feet Assistive device: None Gait Pattern/deviations: WFL(Within Functional Limits) Gait velocity: WFL Gait velocity interpretation: >2.62 ft/sec, indicative of  independent community ambulator General Gait Details: steady cadence, no LoB, no buckling,      Balance Overall balance assessment: Independent                                           Pertinent Vitals/Pain Pain Assessment: No/denies pain    Home Living Family/patient expects to be discharged to:: Private residence Living Arrangements: Spouse/significant other Available Help at Discharge: Family;Available 24 hours/day Type of Home: House Home Access: Stairs to enter Entrance Stairs-Rails: None Entrance Stairs-Number of Steps: 2 Home Layout: One level Home Equipment: Shower seat - built in;Grab bars - tub/shower;Hand held Tourist information centre manager - 2 wheels      Prior Function Level of Independence: Independent         Comments: community ambulator, and drover        Extremity/Trunk Assessment   Upper Extremity Assessment Upper Extremity Assessment: Overall WFL for tasks assessed    Lower Extremity Assessment Lower Extremity Assessment: Overall WFL for tasks assessed    Cervical / Trunk Assessment Cervical / Trunk Assessment: Normal  Communication   Communication: No difficulties  Cognition Arousal/Alertness: Awake/alert Behavior During Therapy: WFL for tasks assessed/performed Overall Cognitive Status: Within Functional Limits for tasks assessed                                        General Comments General comments (skin integrity, edema, etc.): VSS throughout session  Assessment/Plan    PT Assessment Patent does not need any further PT services         PT Goals (Current goals can be found in the Care Plan section)  Acute Rehab PT Goals Patient Stated Goal: go home     AM-PAC PT "6 Clicks" Daily Activity  Outcome Measure Difficulty turning over in bed (including adjusting bedclothes, sheets and blankets)?: None Difficulty moving from lying on back to sitting on the side of the bed? : None Difficulty  sitting down on and standing up from a chair with arms (e.g., wheelchair, bedside commode, etc,.)?: None Help needed moving to and from a bed to chair (including a wheelchair)?: None Help needed walking in hospital room?: None Help needed climbing 3-5 steps with a railing? : None 6 Click Score: 24    End of Session Equipment Utilized During Treatment: Gait belt Activity Tolerance: Patient tolerated treatment well Patient left: in bed;with call bell/phone within reach;with family/visitor present Nurse Communication: Mobility status PT Visit Diagnosis: Other symptoms and signs involving the nervous system (R29.898)    Time: 4650-3546 PT Time Calculation (min) (ACUTE ONLY): 18 min   Charges:   PT Evaluation $PT Eval Moderate Complexity: 1 Mod     PT G Codes:   PT G-Codes **NOT FOR INPATIENT CLASS** Functional Assessment Tool Used: AM-PAC 6 Clicks Basic Mobility Functional Limitation: Mobility: Walking and moving around Mobility: Walking and Moving Around Current Status (F6812): 0 percent impaired, limited or restricted Mobility: Walking and Moving Around Goal Status (X5170): 0 percent impaired, limited or restricted Mobility: Walking and Moving Around Discharge Status (Y1749): 0 percent impaired, limited or restricted    Benjamine Mola B. Migdalia Dk PT, DPT Acute Rehabilitation  [phone] Pager [phone]    [place] 07/04/2017, 9:41 AM

## 2017-07-04 NOTE — Progress Notes (Signed)
Pt d/c home, pt is stable, no new concerns. D/c instructions done with teach back, pt verbalize understanding, pt is transported from hospital by significant orther

## 2017-07-04 NOTE — Care Management Note (Signed)
Case Management Note  Patient Details  Name: Peter Novak MRN: [id number] Date of Birth: [birth year]  Subjective/Objective:    Pt in with TIA. He is from home with his spouse.                Action/Plan: No f/u and no DME per PT. Awaiting OT eval. CM following for d/c needs, physician orders.  Expected Discharge Date:  (unknown)               Expected Discharge Plan:  Home/Self Care  In-House Referral:     Discharge planning Services     Post Acute Care Choice:    Choice offered to:     DME Arranged:    DME Agency:     HH Arranged:    HH Agency:     Status of Service:  In process, will continue to follow  If discussed at Long Length of Stay Meetings, dates discussed:    Additional Comments:  Pollie Friar, RN 07/04/2017, 12:04 PM

## 2017-07-04 NOTE — Progress Notes (Signed)
STROKE TEAM PROGRESS NOTE   SUBJECTIVE (INTERVAL HISTORY) His  girlfriend is at the bedside.  Patient found laying in bed in NAD. Recently returned from ECHO. Overall he feels his condition is completely resolved.  Voices no new complaints. No new events reported overnight. POC reviewed with patient and attending [place].  He stated that he had word finding difficulty lasting for 10 min and then started to have the HA which is similar to his previous HA with visual / aphasia aura 8-9 years ago. He had two episodes of migraine with aura, one with visual and one with speech. But he has not had such for a long time.   OBJECTIVE No results for input(s): GLUCAP in the last 168 hours. Recent Labs  Lab 07/03/17 1212 07/03/17 1246 07/04/17 0515  NA 140 141 140  K 4.2 4.1 4.4  CL 106 105 106  CO2 26  --  28  GLUCOSE 97 96 110*  BUN 15 14 9   CREATININE 1.01 1.00 1.00  CALCIUM 9.2  --  9.2   Recent Labs  Lab 07/03/17 1212 07/04/17 0515  AST 26 22  ALT 28 25  ALKPHOS 64 56  BILITOT 0.9 1.1  PROT 6.8 6.5  ALBUMIN 4.3 3.8   Recent Labs  Lab 07/03/17 1212 07/03/17 1246 07/04/17 0515  WBC 5.6  --  4.8  NEUTROABS 2.9  --   --   HGB 13.2 13.3 12.9*  HCT 38.8* 39.0 39.5  MCV 87.4  --  88.0  PLT 187  --  171   No results for input(s): CKTOTAL, CKMB, CKMBINDEX, TROPONINI in the last 168 hours. Recent Labs    07/03/17 1212  LABPROT 12.7  INR 0.96   Recent Labs    07/03/17 1313  COLORURINE STRAW*  LABSPEC 1.004*  PHURINE 5.0  GLUCOSEU NEGATIVE  HGBUR NEGATIVE  BILIRUBINUR NEGATIVE  KETONESUR NEGATIVE  PROTEINUR NEGATIVE  NITRITE NEGATIVE  LEUKOCYTESUR NEGATIVE       Component Value Date/Time   CHOL 176 07/04/2017 0515   CHOL 210 (H) 01/15/2016 0840   CHOL 205 (H) 03/22/2011 0915   TRIG 216 (H) 07/04/2017 0515   TRIG 119 03/22/2011 0915   HDL 36 (L) 07/04/2017 0515   HDL 44 01/15/2016 0840   HDL 47 03/22/2011 0915   CHOLHDL 4.9 07/04/2017 0515   VLDL 43 (H)  07/04/2017 0515   LDLCALC 97 07/04/2017 0515   LDLCALC 137 (H) 01/15/2016 0840   LDLCALC 134 (H) 03/22/2011 0915   Lab Results  Component Value Date   HGBA1C 5.9 04/27/2017      Component Value Date/Time   LABOPIA NONE DETECTED 07/03/2017 1313   COCAINSCRNUR NONE DETECTED 07/03/2017 1313   LABBENZ NONE DETECTED 07/03/2017 1313   AMPHETMU NONE DETECTED 07/03/2017 1313   THCU NONE DETECTED 07/03/2017 1313   LABBARB NONE DETECTED 07/03/2017 1313    Recent Labs  Lab 07/03/17 1212  ETH <10    IMAGING: I have personally reviewed the radiological images below and agree with the radiology interpretations.  Ct Angio Head and neck W Or Wo Contrast 07/04/2017 IMPRESSION: CT HEAD: 1. No acute intracranial process. 2. Stable moderate to severe white matter changes most compatible with chronic small vessel ischemic disease. CTA NECK: 1. Negative CT angiogram of the neck. CTA HEAD: 1. No emergent large vessel occlusion or significant stenosis. 2. Mild intracranial atherosclerosis.   Ct Head Wo Contrast 07/03/2017 IMPRESSION: Widespread decreased attenuation in periventricular white matter, likely fairly diffuse supratentorial small  vessel disease. A degree of underlying demyelination cannot be excluded. No new gray-white compartment lesion. No acute infarct appreciable. No intracranial mass, hemorrhage, or extra-axial fluid collection.   Mr Brain Wo Contrast 07/04/2017 IMPRESSION: 1. No acute intracranial process. 2. Stable nonspecific moderate to severe white matter changes. Possible chronic small vessel ischemic disease and a component of chronic demyelination.   EEG - normal EEG  TTE pending   PHYSICAL EXAM Temp:  [98.3 F (36.8 C)-99 F (37.2 C)] 99 F (37.2 C) (11/05 2049) Pulse Rate:  [47-60] 60 (11/05 2049) Resp:  [16-20] 18 (11/05 2049) BP: (134-166)/(78-89) 151/79 (11/05 2049) SpO2:  [95 %-98 %] 98 % (11/05 2049) Weight:  [97.5 kg (215 lb)] 97.5 kg (215 lb) (11/05  2049)  General - Well nourished, well developed, in no apparent distress Respiratory - Lungs clear bilaterally. No wheezing. Cardiovascular - Regular rate and rhythm with no murmur  Neurological Examination Mental Status: Alert, oriented, thought content appropriate.  Speech fluent with intact comprehension and naming.  Able to follow a 3 step directional command without difficulty. Cranial Nerves: II: Visual fields intact with no extinction to DSS. PERRL.   III,IV, VI: EOMI without nystagmus. No ptosis.  V,VII: Smile symmetric, facial temp sensation normal bilaterally VIII: hearing intact to voice IX,X: palate rises symmetrically XI: Symmetric XII: midline tongue extension Motor: Right :  Upper extremity   5/5                                      Left:     Upper extremity   5/5             Lower extremity   5/5                                                  Lower extremity   5/5 No parietal or cerebellar drift.  Sensory: Temp and light touch intact x 4. No extinction.  Cerebellar: No ataxia with FNF bilaterally.  Gait: No truncal ataxia  ASSESSMENT AND PLAN: 66 yo M with hx of ADD, HLD, melanoma x 2, migraine with aura admitted for acute onset word finding difficulty followed by HA.    Most likely complicated migraine - less likely TIA, all TIA workup negative thus far. Given concomitant headache with migrainous features (unilaterality, throbbing quality, severe pain), the most likely etiology for the patient's transient aphasia is felt to be complicated migraine. +prior history of migraine with visual or aphasia aura, last episode in 2009-2010.  Resultant Symptoms - All admission symptoms have resolved  MRI Head -MRI brain shows no acute infarction. Moderate chronic small vessel ischemic changes are stable relative to April 2017.   CTA Head/Neck - CTA head and neck negative for LVO. Mild intracranial atherosclerosis is noted.   2D Echo - PENDING  EEG - NEGATIVE  LDL - 97,  Triglycerides 216  HgbA1c 5.9  - 03/2017  VTE prophylaxis - SCD's   Diet - Diet Heart Room service appropriate? Yes; Fluid consistency: Thin   CVA Meds-   prior to admission, ASA 81 mg 2x/week now on aspirin 81 mg daily.   Patient counseled to be compliant with her antithrombotic medications  Therapy recommendations:  NONE  Disposition: HOME   Recommendations  Discharge  home if ECHO results negative  Close follow up with PCP for HTN  Please discharge patient on aspirin 81 mg daily and Lipitor 20 mg QOD  Ongoing aggressive stroke risk factor management  Follow up with Dr Jaynee Eagles in 6 weeks  MIGRAINE HISTORY:  First migraine 2016, lasting 10 minutes, Right eye visual changes and garbled speech  Few years after, another migraine without garbled speech  Most recent migraine in 2009-2010, visual changes only, lasting 10 -15 minutes  ADVANCED WHITE MATTER DISEASE:  01/2016 MRI of the brain with patient which showed advanced for age white matter disease in the cerebral hemispheres with a frontal predominance where there is developing confluent signal abnormality. Nonspecific pattern, likely from chronic microvascular disease at this age. Uncontrolled risk factors  Dr Ferdinand Lango continue to follow changes.   ASA 81 mg and Lipitor 20 mg was instructed to patient on 01/2016 office visit.  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 Yong Channel   Intermittent Episodes of Ataxia in March 2017 for a few months, referred to Dr Orlene Erm  HYPERTENSION: Stable Permissive hypertension (OK if <220/120) for 24-48 hours post stroke and then gradually normalized within 5-7 days.  Long term BP goal normotensive.   Likely has some underlying, undiagnosed HTN, will need close PCP follow up.  HYPERLIPIDEMIA:  Home meds:  Lipitor 20 mg, patient was only  taking 2x/week  LDL   94 , goal < 70, Triglycerides 216  Not tolerating lipitor well due to muscle cramping  Increased Lipitor to 20 mg QOD  Continue statin at discharge  Other Stroke Risk Factors:  Advanced age   Hospital day # 0  Renie Ora Stroke Neurology Team 07/04/2017 2:41 PM  I reviewed above note and agree with the assessment and plan. I have made any additions or clarifications directly to the above note. Pt was seen and examined. Stroke work up negative. History of migraine with aura, and current episode also consistent with migraine aura. He does have extensive WM changes, continue ASA and lipitor.  Neurology to sign-off at this time. Please call with any further questions or concerns. Follow up with Dr. Jaynee Eagles at [facility] in 6 weeks. Thank you for this consultation.  Rosalin Hawking, MD PhD Stroke Neurology 07/04/2017 3:54 PM      To contact Stroke Continuity provider, please refer to [email or web address] After hours, contact General Neurology

## 2017-07-04 NOTE — Progress Notes (Signed)
OT Screen    07/04/17 1200  OT Visit Information  Last OT Received On 07/04/17  Reason Eval/Treat Not Completed OT screened, no needs identified, will sign off (Per PT and RN, pt is back pt baseline fucntion for ADLs and functional mobility. No acute OT need and will sign off. Thank you.)   [place], OTR/L Acute Rehab Pager: [phone] Office: [phone]

## 2017-07-04 NOTE — Consult Note (Signed)
NEURO HOSPITALIST CONSULT NOTE   Requestig physician: Dr. Maryland Pink  Reason for Consult: Transient aphasia with left sided headache  History obtained from:   Patient and Chart     HPI:                                                                                                                                          Peter Novak is an 66 y.o. male who presented to the [facility] ED yesterday after experiencing approximately 10 minutes of word finding difficulty. The patient describes this as having had "trouble getting the words out".  Total duration of the aphasia was 10 minutes. He had a throbbing 7/10 left temporal headache which began shortly before the onset of the aphasia. Denies having had photophobia, sonophobia or osmophobia. Also denies having had limb weakness, gait instability, limb sensory numbness or vision changes. He has had a similar prior episode in the past. Prior MRIs have shown moderate chronic microvascular ischemic changes; MRI at [facility] showed the same, with no acute infarction seen. On my review of the images, the findings are stable relative to his April 2018 scan. The patient has a history of migraine with aura, but gets these infrequently. His last migraine episode was over one year ago; symptoms included a scintillating scotoma on the right. He has seen Dr. Jaynee Eagles for evaluation of the chronic ischemic changes in the past; he states that he was instructed to improve his vascular risk factors.   Further history of symptoms is obtained from ED Triage note: "Patient reports he bending over the shower, giving his dog a bath, when he suddenly developed a sharp, left temple headache. When his wife came home, he was unable to talk and express his thoughts. He reports his thoughts were unclear but his thoughts he did have, he was unable to talk or write out his thoughts. Patient reports he is starting to feel better. Patient is alert, oriented x 4, and ambulatory to  the triage room."  Past Medical History:  Diagnosis Date  . ADD (attention deficit disorder with hyperactivity)   . Constipation   . Hearing loss   . History of skin cancer    Melanoma X 2  . Hyperlipidemia     Past Surgical History:  Procedure Laterality Date  . CHOLECYSTECTOMY  2005  . COLONOSCOPY  2008   negative  . ELBOW SURGERY  1994  . HERNIA REPAIR  2005  . INNER EAR SURGERY  2006   Incus transposition;oscillculoplasty; Dr Thornell Mule  . INNER EAR SURGERY  2011   Stirrup resection  . SHOULDER ARTHROSCOPY  2004   Dr Mayer Camel  . TONSILLECTOMY AND ADENOIDECTOMY    . VASECTOMY      Family History  Problem Relation Age of Onset  .  Diverticulosis Mother   . Diabetes Father        AAA  . Sudden death Brother 40       CAD; MI @ 33, smoker, sedentary, overweight, diabetes, HLD  . Diabetes Brother   . Hyperlipidemia Brother   . Colon cancer Maternal Aunt 82  . Heart attack Paternal Grandfather 58  . Stroke Neg Hx   . Neuropathy Neg Hx   . Multiple sclerosis Neg Hx   . Migraines Neg Hx   . Dementia Neg Hx     Social History:  reports that  has never smoked. he has never used smokeless tobacco. He reports that he drinks about 2.4 oz of alcohol per week. He reports that he does not use drugs.  No Known Allergies  MEDICATIONS:                                                                                                                     Prior to Admission:  Medications Prior to Admission  Medication Sig Dispense Refill Last Dose  . ALPRAZolam (XANAX) 0.25 MG tablet Take 0.125 mg at bedtime as needed by mouth for anxiety.   07/02/2017 at Unknown time  . atorvastatin (LIPITOR) 20 MG tablet TAKE ONE TABLET BY MOUTH DAILY (Patient taking differently: take 1 tablet by mouth on Tuesdays and Wednesdays only) 90 tablet 9 Past Week at Unknown time  . Esomeprazole Magnesium (NEXIUM PO) Take 1 tablet daily by mouth.   07/03/2017 at Unknown time  . Ibuprofen 200 MG CAPS Take 400 mg  daily as needed by mouth (arthritis pain).   Past Week at Unknown time  . Multiple Vitamin (MULTIVITAMIN) tablet Take 1 tablet by mouth daily.   07/02/2017 at Unknown time  . Polyethylene Glycol 3350 (MIRALAX PO) Take 1 Dose by mouth as needed. Patient will take a dose before colon prep day   Past Month at Unknown time  . tadalafil (CIALIS) 20 MG tablet Take 1 tablet (20 mg total) by mouth as needed. one every 3 days as needed 10 tablet 5 PRN/NOT USED IN OVER ONE MONTH  . ALPRAZolam (XANAX) 0.5 MG tablet Take 1 tablet (0.5 mg total) by mouth 2 (two) times daily as needed for anxiety. 30 tablet 2 Past Week  . omeprazole (PRILOSEC) 20 MG capsule Take 1 capsule (20 mg total) by mouth daily. Daily as needed (Patient not taking: Reported on 07/03/2017) 90 capsule 3 Not Taking at Unknown time   Scheduled: . aspirin  300 mg Rectal Daily   Or  . aspirin  325 mg Oral Daily  . enoxaparin (LOVENOX) injection  40 mg Subcutaneous Q24H    ROS:  Denies fever. No symptoms of recent illness. Other ROS as per HPI.   Blood pressure (!) 151/79, pulse 60, temperature 99 F (37.2 C), temperature source Oral, resp. rate 18, height 6\' 3"  (1.905 m), weight 97.5 kg (215 lb), SpO2 98 %.  General Examination:                                                                                                      HEENT-  [place]/AT    Lungs- Respirations unlabored Extremities- No edema  Neurological Examination Mental Status: Alert, oriented, thought content appropriate.  Speech fluent with intact comprehension and naming.  Able to follow a 3 step directional command without difficulty. Cranial Nerves: II: Visual fields intact with no extinction to DSS. PERRL.   III,IV, VI: EOMI without nystagmus. No ptosis.  V,VII: Smile symmetric, facial temp sensation normal bilaterally VIII: hearing intact  to voice IX,X: palate rises symmetrically XI: Symmetric XII: midline tongue extension Motor: Right : Upper extremity   5/5    Left:     Upper extremity   5/5  Lower extremity   5/5     Lower extremity   5/5 Negative Barre. No parietal or cerebellar drift.  Sensory: Temp and light touch intact x 4. No extinction.  Deep Tendon Reflexes: 2+ upper extremities. 3+ patellae, 1+ achilles bilaterally. Cerebellar: No ataxia with FNF bilaterally.  Gait: Deferred.   Lab Results: Basic Metabolic Panel: Recent Labs  Lab 07/03/17 1212 07/03/17 1246  NA 140 141  K 4.2 4.1  CL 106 105  CO2 26  --   GLUCOSE 97 96  BUN 15 14  CREATININE 1.01 1.00  CALCIUM 9.2  --     Liver Function Tests: Recent Labs  Lab 07/03/17 1212  AST 26  ALT 28  ALKPHOS 64  BILITOT 0.9  PROT 6.8  ALBUMIN 4.3   No results for input(s): LIPASE, AMYLASE in the last 168 hours. No results for input(s): AMMONIA in the last 168 hours.  CBC: Recent Labs  Lab 07/03/17 1212 07/03/17 1246  WBC 5.6  --   NEUTROABS 2.9  --   HGB 13.2 13.3  HCT 38.8* 39.0  MCV 87.4  --   PLT 187  --     Cardiac Enzymes: No results for input(s): CKTOTAL, CKMB, CKMBINDEX, TROPONINI in the last 168 hours.  Lipid Panel: No results for input(s): CHOL, TRIG, HDL, CHOLHDL, VLDL, LDLCALC in the last 168 hours.  CBG: No results for input(s): GLUCAP in the last 168 hours.  Microbiology: No results found for this or any previous visit.  Coagulation Studies: Recent Labs    07/03/17 1212  LABPROT 12.7  INR 0.96    Imaging: Ct Angio Head W Or Wo Contrast  Result Date: 07/04/2017 CLINICAL DATA:  Transient ischemic attack. History of hyperlipidemia, melanoma, prostate cancer. EXAM: CT ANGIOGRAPHY HEAD AND NECK TECHNIQUE: Multidetector CT imaging of the head and neck was performed using the standard protocol during bolus administration of intravenous contrast. Multiplanar CT image reconstructions and MIPs were obtained to  evaluate the vascular anatomy. Carotid  stenosis measurements (when applicable) are obtained utilizing NASCET criteria, using the distal internal carotid diameter as the denominator. CONTRAST:  50 cc Isovue 370 COMPARISON:  MRI of the head June 29, 2016 and CT HEAD July 03, 2017 and CTA HEAD and neck Jan 18, 2016. FINDINGS: CT HEAD FINDINGS BRAIN: No intraparenchymal hemorrhage, mass effect nor midline shift. The ventricles and sulci are normal for age. Patchy supratentorial white matter hypodensities predominately in peripheral distribution relatively unchanged. No acute large vascular territory infarcts. No abnormal extra-axial fluid collections. Basal cisterns are patent. VASCULAR: Unremarkable. SKULL: No skull fracture. No significant scalp soft tissue swelling. SINUSES/ORBITS: Trace paranasal sinus mucosal thickening without air-fluid levels. Mastoid air cells are well aerated. The included ocular globes and orbital contents are non-suspicious. OTHER: None. CTA NECK AORTIC ARCH: Normal appearance of the thoracic arch, normal branch pattern. The origins of the innominate, left Common carotid artery and subclavian artery are widely patent. RIGHT CAROTID SYSTEM: Common carotid artery is widely patent, coursing in a straight line fashion. Normal appearance of the carotid bifurcation without hemodynamically significant stenosis by NASCET criteria. Normal appearance of the internal carotid artery. LEFT CAROTID SYSTEM: Common carotid artery is widely patent, coursing in a straight line fashion. Normal appearance of the carotid bifurcation without hemodynamically significant stenosis by NASCET criteria. Normal appearance of the internal carotid artery. VERTEBRAL ARTERIES:RIGHT vertebral artery is dominant. Streak artifact limiting assessment of RIGHT V1. SKELETON: No acute osseous process though bone windows have not been submitted. Stable geographic sclerotic lesion T4, possible bone island versus stable prostate  metastasis. Mild degenerative changes cervical spine. OTHER NECK: Soft tissues of the neck are nonacute though, not tailored for evaluation. Mildly heterogeneous thyroid without dominant nodule. Atrophic RIGHT submandibular gland. UPPER CHEST: Included lung apices are clear. No superior mediastinal lymphadenopathy. CTA HEAD ANTERIOR CIRCULATION: Patent cervical internal carotid arteries, petrous, cavernous and supra clinoid internal carotid arteries. Patent anterior communicating artery. Unchanged widely fenestrated LEFT A1 segment. Patent anterior and middle cerebral arteries, mild luminal irregularity compatible with atherosclerosis. No large vessel occlusion, significant stenosis, contrast extravasation or aneurysm. POSTERIOR CIRCULATION: Patent vertebral arteries, vertebrobasilar junction and basilar artery, as well as main branch vessels. Patent posterior cerebral arteries, mild luminal irregularity compatible with atherosclerosis. Small bilateral posterior communicating artery's present. No large vessel occlusion, significant stenosis, contrast extravasation or aneurysm. VENOUS SINUSES: Major dural venous sinuses are patent though not tailored for evaluation on this angiographic examination. ANATOMIC VARIANTS: None. DELAYED PHASE: Not performed. MIP images reviewed. IMPRESSION: CT HEAD: 1. No acute intracranial process. 2. Stable moderate to severe white matter changes most compatible with chronic small vessel ischemic disease. CTA NECK: 1. Negative CT angiogram of the neck. CTA HEAD: 1. No emergent large vessel occlusion or significant stenosis. 2. Mild intracranial atherosclerosis. Electronically Signed   By: Elon Alas M.D.   On: 07/04/2017 02:25   Ct Head Wo Contrast  Result Date: 07/03/2017 CLINICAL DATA:  Left temporal region headache with dysarthria and altered mental status EXAM: CT HEAD WITHOUT CONTRAST TECHNIQUE: Contiguous axial images were obtained from the base of the skull through the  vertex without intravenous contrast. COMPARISON:  Head CT Jan 18, 2016; brain MRI June 29, 2016 FINDINGS: Brain: The ventricles are normal in size and configuration. There is no intracranial mass, hemorrhage, extra-axial fluid collection, or midline shift. There is extensive decreased attenuation in the centra semiovale bilaterally, similar to prior studies. No acute infarct is demonstrable. Vascular: There is no appreciable hyperdense vessel. There is no appreciable arterial  vascular calcification. Skull: Bony calvarium appears intact. Sinuses/Orbits: Visualized paranasal sinuses are clear. Orbits appear symmetric bilaterally. Other: Visualized mastoid air cells are clear. 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 A degree of underlying demyelination cannot be excluded. No new gray-white compartment lesion. No acute infarct appreciable. No intracranial mass, hemorrhage, or extra-axial fluid collection. Electronically Signed   By: Lowella Grip III M.D.   On: 07/03/2017 14:26   Ct Angio Neck W Or Wo Contrast  Result Date: 07/04/2017 CLINICAL DATA:  Transient ischemic attack. History of hyperlipidemia, melanoma, prostate cancer. EXAM: CT ANGIOGRAPHY HEAD AND NECK TECHNIQUE: Multidetector CT imaging of the head and neck was performed using the standard protocol during bolus administration of intravenous contrast. Multiplanar CT image reconstructions and MIPs were obtained to evaluate the vascular anatomy. Carotid stenosis measurements (when applicable) are obtained utilizing NASCET criteria, using the distal internal carotid diameter as the denominator. CONTRAST:  50 cc Isovue 370 COMPARISON:  MRI of the head June 29, 2016 and CT HEAD July 03, 2017 and CTA HEAD and neck Jan 18, 2016. FINDINGS: CT HEAD FINDINGS BRAIN: No intraparenchymal hemorrhage, mass effect nor midline shift. The ventricles and sulci are  normal for age. Patchy supratentorial white matter hypodensities predominately in peripheral distribution relatively unchanged. No acute large vascular territory infarcts. No abnormal extra-axial fluid collections. Basal cisterns are patent. VASCULAR: Unremarkable. SKULL: No skull fracture. No significant scalp soft tissue swelling. SINUSES/ORBITS: Trace paranasal sinus mucosal thickening without air-fluid levels. Mastoid air cells are well aerated. The included ocular globes and orbital contents are non-suspicious. OTHER: None. CTA NECK AORTIC ARCH: Normal appearance of the thoracic arch, normal branch pattern. The origins of the innominate, left Common carotid artery and subclavian artery are widely patent. RIGHT CAROTID SYSTEM: Common carotid artery is widely patent, coursing in a straight line fashion. Normal appearance of the carotid bifurcation without hemodynamically significant stenosis by NASCET criteria. Normal appearance of the internal carotid artery. LEFT CAROTID SYSTEM: Common carotid artery is widely patent, coursing in a straight line fashion. Normal appearance of the carotid bifurcation without hemodynamically significant stenosis by NASCET criteria. Normal appearance of the internal carotid artery. VERTEBRAL ARTERIES:RIGHT vertebral artery is dominant. Streak artifact limiting assessment of RIGHT V1. SKELETON: No acute osseous process though bone windows have not been submitted. Stable geographic sclerotic lesion T4, possible bone island versus stable prostate metastasis. Mild degenerative changes cervical spine. OTHER NECK: Soft tissues of the neck are nonacute though, not tailored for evaluation. Mildly heterogeneous thyroid without dominant nodule. Atrophic RIGHT submandibular gland. UPPER CHEST: Included lung apices are clear. No superior mediastinal lymphadenopathy. CTA HEAD ANTERIOR CIRCULATION: Patent cervical internal carotid arteries, petrous, cavernous and supra clinoid internal carotid  arteries. Patent anterior communicating artery. Unchanged widely fenestrated LEFT A1 segment. Patent anterior and middle cerebral arteries, mild luminal irregularity compatible with atherosclerosis. No large vessel occlusion, significant stenosis, contrast extravasation or aneurysm. POSTERIOR CIRCULATION: Patent vertebral arteries, vertebrobasilar junction and basilar artery, as well as main branch vessels. Patent posterior cerebral arteries, mild luminal irregularity compatible with atherosclerosis. Small bilateral posterior communicating artery's present. No large vessel occlusion, significant stenosis, contrast extravasation or aneurysm. VENOUS SINUSES: Major dural venous sinuses are patent though not tailored for evaluation on this angiographic examination. ANATOMIC VARIANTS: None. DELAYED PHASE: Not performed. MIP images reviewed. IMPRESSION: CT HEAD: 1. No acute intracranial process. 2. Stable moderate to severe white matter changes most compatible with chronic small vessel ischemic disease. CTA NECK: 1. Negative  CT angiogram of the neck. CTA HEAD: 1. No emergent large vessel occlusion or significant stenosis. 2. Mild intracranial atherosclerosis. Electronically Signed   By: Elon Alas M.D.   On: 07/04/2017 02:25   Mr Brain Wo Contrast  Result Date: 07/04/2017 CLINICAL DATA:  Acute onset LEFT temporal headaches while washing his dog. Subsequent aphasia, now at baseline. EXAM: MRI HEAD WITHOUT CONTRAST TECHNIQUE: Multiplanar, multiecho pulse sequences of the brain and surrounding structures were obtained without intravenous contrast. COMPARISON:  CT HEAD July 03, 2001 18 and MRI of the head June 29, 2006 FINDINGS: BRAIN: No reduced diffusion to suggest acute ischemia or hyperacute demyelination. No susceptibility artifact to suggest hemorrhage. The ventricles and sulci are normal for patient's age. Patchy to confluent supratentorial white matter FLAIR T2 hyperintensities including LEFT greater  than RIGHT temporal lobe again seen, unchanged from prior MRI. No midline shift, mass effect or masses. VASCULAR: Normal major intracranial vascular flow voids present at skull base. SKULL AND UPPER CERVICAL SPINE: No abnormal sellar expansion. No suspicious calvarial bone marrow signal. Craniocervical junction maintained. SINUSES/ORBITS: The mastoid air-cells and included paranasal sinuses are well-aerated. The included ocular globes and orbital contents are non-suspicious. OTHER: None. IMPRESSION: 1. No acute intracranial process. 2. Stable nonspecific moderate to severe white matter changes. Possible chronic small vessel ischemic disease and a component of chronic demyelination. Electronically Signed   By: Elon Alas M.D.   On: 07/04/2017 04:59     Assessment: 66 year old male presenting with transient aphasia and left sided headache 1. CTA head and neck negative for LVO. Mild intracranial atherosclerosis is noted.  2. MRI brain shows no acute infarction. Moderate chronic small vessel ischemic changes are stable relative to April 2017.  3. Given concomitant headache with migrainous features (unilaterality, throbbing quality, severe pain), the most likely etiology for the patient's transient aphasia is felt to be complicated migraine. Of note, he has a prior history of migraine with aura.  4. Lower on the DDx is TIA, which warrants a stroke/TIA work up.   Recommendations: 1. HgbA1c, fasting lipid panel 2. TTE 3. Cardiac telemetry to evaluate for possible intermittent atrial fibrillation 4. PT consult, OT consult, Speech consult 5. Continue atorvastatin. Consider increasing dose to 40 mg qd.  6. Agree with starting ASA. Switch ASA to 81 mg po qd to lower risk of bleeding complications.  7. Permissive HTN x 24 hours 8. Frequent neuro checks 9. Please page stroke NP  Or  PA  Or MD from 8am -4 pm  as this patient from this time will be  followed by the stroke.   You can look them up on  [email or web address]  Password [email or web address]    Electronically signed: Dr. Kerney Elbe 07/04/2017, 5:23 AM

## 2017-07-04 NOTE — Procedures (Signed)
ELECTROENCEPHALOGRAM REPORT  Date of Study: 07/04/2017  Patient's Name: RAMIR MALERBA MRN: [id number] Date of Birth: 1950/12/27  Referring Provider: Bonnielee Haff, MD  Clinical History: 66 y.o. male who presented to the [facility] ED yesterday after experiencing approximately 10 minutes of word finding difficulty  Medications: Tylenol Xanax Lipitor Lovenox  Technical Summary: A multichannel digital EEG recording measured by the international 10-20 system with electrodes applied with paste and impedances below 5000 ohms performed in our laboratory with EKG monitoring in an awake and asleep patient.  Hyperventilation was not performed.  Photic stimulation was performed.  The digital EEG was referentially recorded, reformatted, and digitally filtered in a variety of bipolar and referential montages for optimal display.    Description: The patient is awake and asleep during the recording.  During maximal wakefulness, there is a symmetric, medium voltage 11 Hz posterior dominant rhythm that attenuates with eye opening.  The record is symmetric.  During drowsiness and sleep, there is an increase in theta slowing of the background.  Vertex waves and symmetric sleep spindles were seen.  Photic stimulation did not elicit any abnormalities.  There were no epileptiform discharges or electrographic seizures seen.    EKG lead was unremarkable.  Impression: This awake and asleep EEG is normal.    Clinical Correlation: A normal EEG does not exclude a clinical diagnosis of epilepsy.  If further clinical questions remain, prolonged EEG may be helpful.  Clinical correlation is advised.   Metta Clines, DO

## 2017-07-05 ENCOUNTER — Telehealth: Payer: Self-pay

## 2017-07-05 NOTE — Telephone Encounter (Signed)
May order a1c under hyperglycemia- I do not believe there were other lab recommendations.   If neurology recommendations were no ibuprofen or aleve then I do not want to go against their recommendations. He could certainly call them to ask.

## 2017-07-05 NOTE — Telephone Encounter (Signed)
Patient under observation on 07/03/2017 in hospital and diagnosed with complicated migraine.  Was asked to follow up with PCP in 1-2 weeks for labs.  Spoke with patient.  Patient feeling completely fine.  States he does not feel he needs to come in for a visit at this time.  His wife is having shoulder surgery tomorrow and he is exhausted from his time in the hospital.  He would be agreeable to coming in for a lab appointment to have blood drawn, if necessary, however.  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  He says he says workup in the hospital was completely negative.  His only concern is that he takes Aleve or Mobic regularly for arthritis pain and when in the hospital he was told he should only take Tylenol.  This upset him.  Last OV 05/02/2017  Please advise if ok for patient to come in for labs only and if ok to take Aleve or Mobic?

## 2017-07-05 NOTE — Telephone Encounter (Signed)
He could also schedule follow up in a month or so if that is better time for him

## 2017-07-05 NOTE — Telephone Encounter (Signed)
This encounter was created in error - please disregard.

## 2017-07-07 ENCOUNTER — Other Ambulatory Visit: Payer: Self-pay

## 2017-07-07 DIAGNOSIS — R739 Hyperglycemia, unspecified: Secondary | ICD-10-CM

## 2017-07-07 NOTE — Telephone Encounter (Signed)
Called patient but he stated he would call me back as it wasn't a good time

## 2017-07-07 NOTE — Telephone Encounter (Signed)
Spoke with patient and scheduled him for December 17 for an A1C. Last one drawn was 04/27/17.

## 2017-07-13 ENCOUNTER — Telehealth: Payer: Self-pay | Admitting: Neurology

## 2017-07-13 NOTE — Telephone Encounter (Signed)
This patient was in the hospital and Dr Erlinda Hong wants him to f/u with you in 6 weeks. I called him to schedule and he does not want to f/u here and have to pay a copay for you to tell him that everything is fine and keep doing what he is doing. He would like for you to review his notes in the hospital and send him a message thru my chart if you see anything that he needs to come in for. dg

## 2017-07-17 NOTE — Telephone Encounter (Signed)
I reviewed his chart and have no further additions to his workup please let him know thanks

## 2017-07-24 ENCOUNTER — Encounter: Payer: Self-pay | Admitting: Family Medicine

## 2017-07-28 ENCOUNTER — Telehealth: Payer: Self-pay | Admitting: Neurology

## 2017-07-28 NOTE — Telephone Encounter (Signed)
I did call the patient back to let him know you had nothing more to add to his work up and he said that the scan that he had he was unable to see them on my chart could someone call him with those results?

## 2017-07-30 NOTE — Telephone Encounter (Signed)
MRI of the brain was stable, no changes from the one I reviewed with him, still showed the white matter changes we discussed in detail. CTA of the head and neck were unremarkable, no significant atherosclerosis and no blockages. If he wants anymore detail he needs to schedule an appointment.

## 2017-07-31 NOTE — Telephone Encounter (Signed)
Called patient and discussed all of what Dr. Jaynee Eagles wrote below regarding MRI brain & CTA head and neck. He verbalized understanding and appreciation for the call.

## 2017-08-14 ENCOUNTER — Other Ambulatory Visit (INDEPENDENT_AMBULATORY_CARE_PROVIDER_SITE_OTHER): Payer: Medicare Other

## 2017-08-14 DIAGNOSIS — R739 Hyperglycemia, unspecified: Secondary | ICD-10-CM

## 2017-08-14 LAB — HEMOGLOBIN A1C: Hgb A1c MFr Bld: 6 % (ref 4.6–6.5)

## 2017-08-15 ENCOUNTER — Encounter: Payer: Self-pay | Admitting: Family Medicine

## 2017-09-17 ENCOUNTER — Emergency Department (HOSPITAL_COMMUNITY)
Admission: EM | Admit: 2017-09-17 | Discharge: 2017-09-17 | Discharge status: Against Medical Advice | Payer: Medicare Other

## 2017-09-17 NOTE — ED Triage Notes (Signed)
Pt LWBS / left without being triaged.

## 2018-01-23 ENCOUNTER — Ambulatory Visit: Payer: Medicare Other

## 2018-02-01 ENCOUNTER — Ambulatory Visit: Payer: Medicare Other | Admitting: *Deleted

## 2018-03-22 LAB — PSA: PSA: 7.92

## 2018-03-30 ENCOUNTER — Encounter: Payer: Self-pay | Admitting: Family Medicine

## 2018-06-10 ENCOUNTER — Other Ambulatory Visit: Payer: Self-pay | Admitting: Neurology

## 2018-06-20 NOTE — Progress Notes (Signed)
Subjective:   Peter Novak is a 67 y.o. male who presents for an Initial Medicare Annual Wellness Visit.  Reports health as every is good  Diet Chol/hdl 4.9 A1c 6.0 Healthy diet    Exercise Cardio x 5 days a week and weight circuit  Health Maintenance Due  Topic Date Due  . INFLUENZA VACCINE  03/29/2018    Cardiac Risk Factors include: advanced age (>74men, >35 women);male gender;family history of premature cardiovascular diseasePSA elevated - Dr Wille Glaser  Colonoscopy 04/2017 and due 5 years  Had shingrix x 2 weeks  Around 06/07/18  Next one due in Jan/ feb      Objective:    Today's Vitals   06/21/18 1003  BP: 136/70  Pulse: (!) 51  SpO2: 96%  Weight: 229 lb (103.9 kg)  Height: 6\' 3"  (1.905 m)   Body mass index is 28.62 kg/m.  Advanced Directives 06/21/2018 07/03/2017 05/15/2017 09/30/2015  Does Patient Have a Medical Advance Directive? Yes No Yes No  Type of Advance Directive - - [place];Living will -  Would patient like information on creating a medical advance directive? - No - Patient declined - -    Current Medications (verified) Outpatient Encounter Medications as of 06/21/2018  Medication Sig  . acetaminophen (TYLENOL) 325 MG tablet Take 2 tablets (650 mg total) every 4 (four) hours as needed by mouth for moderate pain (or temp > 37.5 C (99.5 F)).  [name] Kitchen ALPRAZolam (XANAX) 0.25 MG tablet Take 0.125 mg at bedtime as needed by mouth for anxiety.  [name] Kitchen aspirin EC 81 MG tablet Take 1 tablet (81 mg total) daily by mouth.  [name] Kitchen atorvastatin (LIPITOR) 20 MG tablet Take one tablet every other day.  . Esomeprazole Magnesium (NEXIUM PO) Take 1 tablet daily by mouth.  . Multiple Vitamin (MULTIVITAMIN) tablet Take 1 tablet by mouth daily.  . Polyethylene Glycol 3350 (MIRALAX PO) Take 1 Dose by mouth as needed. Patient will take a dose before colon prep day  . [DISCONTINUED] ALPRAZolam (XANAX) 0.5 MG tablet Take 1 tablet (0.5 mg total) by mouth 2 (two)  times daily as needed for anxiety.   No facility-administered encounter medications on file as of 06/21/2018.     Allergies (verified) Patient has no known allergies.   History: Past Medical History:  Diagnosis Date  . ADD (attention deficit disorder with hyperactivity)   . Constipation   . Hearing loss   . History of skin cancer    Melanoma X 2  . Hyperlipidemia    Past Surgical History:  Procedure Laterality Date  . CHOLECYSTECTOMY  2005  . COLONOSCOPY  2008   negative  . ELBOW SURGERY  1994  . HERNIA REPAIR  2005  . INNER EAR SURGERY  2006   Incus transposition;oscillculoplasty; Dr Thornell Mule  . INNER EAR SURGERY  2011   Stirrup resection  . SHOULDER ARTHROSCOPY  2004   Dr Mayer Camel  . TONSILLECTOMY AND ADENOIDECTOMY    . VASECTOMY     Family History  Problem Relation Age of Onset  . Diverticulosis Mother   . Diabetes Father        AAA  . Sudden death Brother 8       CAD; MI @ 53, smoker, sedentary, overweight, diabetes, HLD  . Diabetes Brother   . Hyperlipidemia Brother   . Colon cancer Maternal Aunt 82  . Heart attack Paternal Grandfather 71  . Stroke Neg Hx   . Neuropathy Neg Hx   . Multiple  sclerosis Neg Hx   . Migraines Neg Hx   . Dementia Neg Hx    Social History   Socioeconomic History  . Marital status: Married    Spouse name: Not on file  . Number of children: Not on file  . Years of education: Not on file  . Highest education level: Not on file  Occupational History  . Not on file  Social Needs  . Financial resource strain: Not on file  . Food insecurity:    Worry: Not on file    Inability: Not on file  . Transportation needs:    Medical: Not on file    Non-medical: Not on file  Tobacco Use  . Smoking status: Never Smoker  . Smokeless tobacco: Never Used  Substance and Sexual Activity  . Alcohol use: Yes    Alcohol/week: 4.0 standard drinks    Types: 2 Glasses of wine, 2 Cans of beer per week    Comment: social only   . Drug use: No    . Sexual activity: Not on file  Lifestyle  . Physical activity:    Days per week: Not on file    Minutes per session: Not on file  . Stress: Not on file  Relationships  . Social connections:    Talks on phone: Not on file    Gets together: Not on file    Attends religious service: Not on file    Active member of club or organization: Not on file    Attends meetings of clubs or organizations: Not on file    Relationship status: Not on file  Other Topics Concern  . Not on file  Social History Narrative   Married 2006, divorced 2010. Currently dating. 2 children (35 and 33 in 2016 in good health), no grandkids.    [employer] grad.       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       Hobbies: place in [place] in [place], outdoor activities, reading, follow things on PC      Tobacco Counseling Counseling given: Yes   Clinical Intake:    Activities of Daily Living In your present state of health, do you have any difficulty performing the following activities: 07/03/2017  Hearing? N  Vision? N  Difficulty concentrating or making decisions? N  Walking or climbing stairs? N  Dressing or bathing? N  Doing errands, shopping? N  Some recent data might be hidden     Immunizations and Health Maintenance Immunization History  Administered Date(s) Administered  . Influenza, High Dose Seasonal PF 06/02/2017  . Influenza,inj,quad, With Preservative 06/19/2015  . Influenza-Unspecified 06/12/2014, 05/29/2018  . Pneumococcal Conjugate-13 10/02/2014  . Pneumococcal Polysaccharide-23 10/14/2015  . Td 11/30/2009  . Tdap 09/30/2015  . Zoster 03/23/2011  . Zoster Recombinat (Shingrix) 06/07/2018   Health Maintenance Due  Topic Date Due  . INFLUENZA VACCINE  03/29/2018    Patient Care Team: Marin Olp, MD as PCP - General (Family Medicine)  Indicate any recent Medical Services you may have received from other than  [facility] providers in the past year (date may be approximate).    Assessment:   This is a routine wellness examination for [facility].  Hearing/Vision screen Hearing Screening Comments: No issues Vision Screening Comments: No issues  Dietary issues and exercise activities discussed: Current Exercise Habits: Home exercise routine;Structured exercise class, Type of exercise: strength training/weights(cardio), Time (Minutes): 60, Frequency (Times/Week): 5, Weekly Exercise (Minutes/Week): 300,  Intensity: Moderate  Goals    . Patient Stated     Continue to be proactive with his health       Depression Screen PHQ 2/9 Scores 06/21/2018 01/17/2017 06/19/2015 10/02/2014  PHQ - 2 Score 0 0 0 0    Fall Risk Fall Risk  06/21/2018 01/17/2017 06/19/2015 10/02/2014  Falls in the past year? No No No No     Cognitive Function: MMSE - Mini Mental State Exam 06/21/2018  Not completed: (No Data)     Ad8 score reviewed for issues:  Issues making decisions:  Less interest in hobbies / activities:  Repeats questions, stories (family complaining):  Trouble using ordinary gadgets (microwave, computer, phone):  Forgets the month or year:   Mismanaging finances:   Remembering appts:  Daily problems with thinking and/or memory: Ad8 score is=0         Screening Tests Health Maintenance  Topic Date Due  . INFLUENZA VACCINE  03/29/2018  . COLONOSCOPY  05/22/2022  . TETANUS/TDAP  09/29/2025  . Hepatitis C Screening  Completed  . PNA vac Low Risk Adult  Completed         Plan:      PCP Notes   Health Maintenance Haf flu vaccine at the pharmacy Had his first shingrix 101019 and the next one due Jan/ Feb 2020  Abnormal Screens  None verbalized today ; states he takes very good care of his health    Referrals  none  Patient concerns; For heart burn; using otc and would like a rx  Will see if it is cheaper  Advised to make an apt with Dr. Yong Channel when leaving Nexium re-ordered  as rx x 30 days   States he is here because he "was told I could order labs and refill meds" and if not he is leaving to get something to eat.  AWV explained and stated this is not "productive use" of his time.  Stated he exercises, eats well, takes excellent care of himself     Nurse Concerns; As noted  Next PCP apt  Advised to make an apt on his way out  Request Dr. Yong Channel order labs which he did The patient was told to make a fup apt with Dr. Yong Channel       I have personally reviewed and noted the following in the patient's chart:   . Medical and social history . Use of alcohol, tobacco or illicit drugs  . Current medications and supplements . Functional ability and status . Nutritional status . Physical activity . Advanced directives . List of other physicians . Hospitalizations, surgeries, and ER visits in previous 12 months . Vitals . Screenings to include cognitive, depression, and falls . Referrals and appointments  In addition, I have reviewed and discussed with patient certain preventive protocols, quality metrics, and best practice recommendations. A written personalized care plan for preventive services as well as general preventive health recommendations were provided to patient.     Wynetta Fines, RN   06/21/2018

## 2018-06-21 ENCOUNTER — Ambulatory Visit (INDEPENDENT_AMBULATORY_CARE_PROVIDER_SITE_OTHER): Payer: Medicare Other

## 2018-06-21 VITALS — BP 136/70 | HR 51 | Ht 75.0 in | Wt 229.0 lb

## 2018-06-21 DIAGNOSIS — E782 Mixed hyperlipidemia: Secondary | ICD-10-CM

## 2018-06-21 DIAGNOSIS — Z8781 Personal history of (healed) traumatic fracture: Secondary | ICD-10-CM

## 2018-06-21 DIAGNOSIS — R739 Hyperglycemia, unspecified: Secondary | ICD-10-CM | POA: Diagnosis not present

## 2018-06-21 DIAGNOSIS — Z0000 Encounter for general adult medical examination without abnormal findings: Secondary | ICD-10-CM

## 2018-06-21 LAB — COMPREHENSIVE METABOLIC PANEL
ALBUMIN: 4.6 g/dL (ref 3.5–5.2)
ALT: 28 U/L (ref 0–53)
AST: 20 U/L (ref 0–37)
Alkaline Phosphatase: 66 U/L (ref 39–117)
BUN: 23 mg/dL (ref 6–23)
CALCIUM: 9.9 mg/dL (ref 8.4–10.5)
CHLORIDE: 105 meq/L (ref 96–112)
CO2: 27 meq/L (ref 19–32)
CREATININE: 1.17 mg/dL (ref 0.40–1.50)
GFR: 65.74 mL/min (ref >60.00)
Glucose, Bld: 118 mg/dL — ABNORMAL HIGH (ref 70–99)
POTASSIUM: 5.1 meq/L (ref 3.5–5.1)
SODIUM: 140 meq/L (ref 135–145)
Total Bilirubin: 1 mg/dL (ref 0.2–1.2)
Total Protein: 7.2 g/dL (ref 6.0–8.3)

## 2018-06-21 LAB — CBC
HEMATOCRIT: 43 % (ref 39.0–52.0)
HEMOGLOBIN: 14.3 g/dL (ref 13.0–17.0)
MCHC: 33.1 g/dL (ref 30.0–36.0)
MCV: 89.5 fl (ref 78.0–100.0)
Platelets: 192 10*3/uL (ref 150.0–400.0)
RBC: 4.81 Mil/uL (ref 4.22–5.81)
RDW: 13.5 % (ref 11.5–15.5)
WBC: 4.2 10*3/uL (ref 4.0–10.5)

## 2018-06-21 LAB — POC URINALSYSI DIPSTICK (AUTOMATED)
Bilirubin, UA: NEGATIVE
Glucose, UA: NEGATIVE
Ketones, UA: NEGATIVE
Leukocytes, UA: NEGATIVE
NITRITE UA: NEGATIVE
PH UA: 5.5 (ref 5.0–8.0)
PROTEIN UA: NEGATIVE
RBC UA: NEGATIVE
Spec Grav, UA: 1.025 (ref 1.010–1.025)
UROBILINOGEN UA: 0.2 U/dL

## 2018-06-21 LAB — LIPID PANEL
CHOL/HDL RATIO: 4
Cholesterol: 141 mg/dL (ref 0–200)
HDL: 36.3 mg/dL — ABNORMAL LOW (ref >39.00)
LDL Cholesterol: 68 mg/dL (ref 0–99)
NONHDL: 105.11
Triglycerides: 187 mg/dL — ABNORMAL HIGH (ref 0.0–149.0)
VLDL: 37.4 mg/dL (ref 0.0–40.0)

## 2018-06-21 LAB — HEMOGLOBIN A1C: Hgb A1c MFr Bld: 6 % (ref 4.6–6.5)

## 2018-06-21 LAB — VITAMIN D 25 HYDROXY (VIT D DEFICIENCY, FRACTURES): VITD: 37.12 ng/mL (ref 30.00–100.00)

## 2018-06-21 MED ORDER — ATORVASTATIN CALCIUM 20 MG PO TABS: ORAL_TABLET | ORAL | 0 refills | Status: DC

## 2018-06-21 NOTE — Progress Notes (Signed)
I have reviewed and agree with note, evaluation, plan.  I had a brief discussion with patient today about labs.  He has made some positive changes in his life with improved exercise and healthier eating-he is hopeful to see some improvement in numbers.  I agree with need for follow-up appointment to discuss reflux  Garret Reddish, MD

## 2018-06-21 NOTE — Patient Instructions (Addendum)
[name] , Thank you for taking time to come for your Medicare Wellness Visit. I appreciate your ongoing commitment to your health goals. Please review the following plan we discussed and let me know if I can assist you in the future.     These are the goals we discussed: Goals    . Patient Stated     Continue to be proactive with his health        This is a list of the screening recommended for you and due dates:  Health Novak  Topic Date Due  . Flu Shot  03/29/2018  . Colon Cancer Screening  05/22/2022  . Tetanus Vaccine  09/29/2025  .  Hepatitis C: One time screening is recommended by Center for Disease Control  (CDC) for  adults born from [birth year] through 1966.   Completed  . Pneumonia vaccines  Completed      Fall Prevention in the Home Falls can cause injuries. They can happen to people of all ages. There are many things you can do to make your home safe and to help prevent falls. What can I do on the outside of my home?  Regularly fix the edges of walkways and driveways and fix any cracks.  Remove anything that might make you trip as you walk through a door, such as a raised step or threshold.  Trim any bushes or trees on the path to your home.  Use bright outdoor lighting.  Clear any walking paths of anything that might make someone trip, such as rocks or tools.  Regularly check to see if handrails are loose or broken. Make sure that both sides of any steps have handrails.  Any raised decks and porches should have guardrails on the edges.  Have any leaves, snow, or ice cleared regularly.  Use sand or salt on walking paths during winter.  Clean up any spills in your garage right away. This includes oil or grease spills. What can I do in the bathroom?  Use night lights.  Install grab bars by the toilet and in the tub and shower. Do not use towel bars as grab bars.  Use non-skid mats or decals in the tub or shower.  If you need to sit down in the  shower, use a plastic, non-slip stool.  Keep the floor dry. Clean up any water that spills on the floor as soon as it happens.  Remove soap buildup in the tub or shower regularly.  Attach bath mats securely with double-sided non-slip rug tape.  Do not have throw rugs and other things on the floor that can make you trip. What can I do in the bedroom?  Use night lights.  Make sure that you have a light by your bed that is easy to reach.  Do not use any sheets or blankets that are too big for your bed. They should not hang down onto the floor.  Have a firm chair that has side arms. You can use this for support while you get dressed.  Do not have throw rugs and other things on the floor that can make you trip. What can I do in the kitchen?  Clean up any spills right away.  Avoid walking on wet floors.  Keep items that you use a lot in easy-to-reach places.  If you need to reach something above you, use a strong step stool that has a grab bar.  Keep electrical cords out of the way.  Do not use floor  polish or wax that makes floors slippery. If you must use wax, use non-skid floor wax.  Do not have throw rugs and other things on the floor that can make you trip. What can I do with my stairs?  Do not leave any items on the stairs.  Make sure that there are handrails on both sides of the stairs and use them. Fix handrails that are broken or loose. Make sure that handrails are as long as the stairways.  Check any carpeting to make sure that it is firmly attached to the stairs. Fix any carpet that is loose or worn.  Avoid having throw rugs at the top or bottom of the stairs. If you do have throw rugs, attach them to the floor with carpet tape.  Make sure that you have a light switch at the top of the stairs and the bottom of the stairs. If you do not have them, ask someone to add them for you. What else can I do to help prevent falls?  Wear shoes that: ? Do not have high  heels. ? Have rubber bottoms. ? Are comfortable and fit you well. ? Are closed at the toe. Do not wear sandals.  If you use a stepladder: ? Make sure that it is fully opened. Do not climb a closed stepladder. ? Make sure that both sides of the stepladder are locked into place. ? Ask someone to hold it for you, if possible.  Clearly mark and make sure that you can see: ? Any grab bars or handrails. ? First and last steps. ? Where the edge of each step is.  Use tools that help you move around (mobility aids) if they are needed. These include: ? Canes. ? Walkers. ? Scooters. ? Crutches.  Turn on the lights when you go into a dark area. Replace any light bulbs as soon as they burn out.  Set up your furniture so you have a clear path. Avoid moving your furniture around.  If any of your floors are uneven, fix them.  If there are any pets around you, be aware of where they are.  Review your medicines with your doctor. Some medicines can make you feel dizzy. This can increase your chance of falling. Ask your doctor what other things that you can do to help prevent falls. This information is not intended to replace advice given to you by your health care provider. Make sure you discuss any questions you have with your health care provider. Document Released: 06/11/2009 Document Revised: 01/21/2016 Document Reviewed: 09/19/2014 Elsevier Interactive Patient Education  2018 Peter Novak, Male A healthy lifestyle and preventive care is important for your health and wellness. Ask your health care provider about what schedule of regular examinations is right for you. What should I know about weight and diet? Eat a Healthy Diet  Eat plenty of vegetables, fruits, whole grains, low-fat dairy products, and lean protein.  Do not eat a lot of foods high in solid fats, added sugars, or salt.  Maintain a Healthy Weight Regular exercise can help you achieve or maintain a  healthy weight. You should:  Do at least 150 minutes of exercise each week. The exercise should increase your heart rate and make you sweat (moderate-intensity exercise).  Do strength-training exercises at least twice a week.  Watch Your Levels of Cholesterol and Blood Lipids  Have your blood tested for lipids and cholesterol every 5 years starting at 68 years of age. If  you are at high risk for heart disease, you should start having your blood tested when you are 68 years old. You may need to have your cholesterol levels checked more often if: ? Your lipid or cholesterol levels are high. ? You are older than 68 years of age. ? You are at high risk for heart disease.  What should I know about cancer screening? Many types of cancers can be detected early and may often be prevented. Lung Cancer  You should be screened every year for lung cancer if: ? You are a current smoker who has smoked for at least 30 years. ? You are a former smoker who has quit within the past 15 years.  Talk to your health care provider about your screening options, when you should start screening, and how often you should be screened.  Colorectal Cancer  Routine colorectal cancer screening usually begins at 68 years of age and should be repeated every 5-10 years until you are 67 years old. You may need to be screened more often if early forms of precancerous polyps or small growths are found. Your health care provider may recommend screening at an earlier age if you have risk factors for colon cancer.  Your health care provider may recommend using home test kits to check for hidden blood in the stool.  A small camera at the end of a tube can be used to examine your colon (sigmoidoscopy or colonoscopy). This checks for the earliest forms of colorectal cancer.  Prostate and Testicular Cancer  Depending on your age and overall health, your health care provider may do certain tests to screen for prostate and  testicular cancer.  Talk to your health care provider about any symptoms or concerns you have about testicular or prostate cancer.  Skin Cancer  Check your skin from head to toe regularly.  Tell your health care provider about any new moles or changes in moles, especially if: ? There is a change in a mole's size, shape, or color. ? You have a mole that is larger than a pencil eraser.  Always use sunscreen. Apply sunscreen liberally and repeat throughout the day.  Protect yourself by wearing long sleeves, pants, a wide-brimmed hat, and sunglasses when outside.  What should I know about heart disease, diabetes, and high blood pressure?  If you are 28-41 years of age, have your blood pressure checked every 3-5 years. If you are 78 years of age or older, have your blood pressure checked every year. You should have your blood pressure measured twice-once when you are at a hospital or clinic, and once when you are not at a hospital or clinic. Record the average of the two measurements. To check your blood pressure when you are not at a hospital or clinic, you can use: ? An automated blood pressure machine at a pharmacy. ? A home blood pressure monitor.  Talk to your health care provider about your target blood pressure.  If you are between 23-51 years old, ask your health care provider if you should take aspirin to prevent heart disease.  Have regular diabetes screenings by checking your fasting blood sugar level. ? If you are at a normal weight and have a low risk for diabetes, have this test once every three years after the age of 59. ? If you are overweight and have a high risk for diabetes, consider being tested at a younger age or more often.  A one-time screening for abdominal aortic aneurysm (AAA)  by ultrasound is recommended for men aged 49-75 years who are current or former smokers. What should I know about preventing infection? Hepatitis B If you have a higher risk for hepatitis  B, you should be screened for this virus. Talk with your health care provider to find out if you are at risk for hepatitis B infection. Hepatitis C Blood testing is recommended for:  Everyone born from [birth year] through 1966.  Anyone with known risk factors for hepatitis C.  Sexually Transmitted Diseases (STDs)  You should be screened each year for STDs including gonorrhea and chlamydia if: ? You are sexually active and are younger than 68 years of age. ? You are older than 68 years of age and your health care provider tells you that you are at risk for this type of infection. ? Your sexual activity has changed since you were last screened and you are at an increased risk for chlamydia or gonorrhea. Ask your health care provider if you are at risk.  Talk with your health care provider about whether you are at high risk of being infected with HIV. Your health care provider may recommend a prescription medicine to help prevent HIV infection.  What else can I do?  Schedule regular health, dental, and eye exams.  Stay current with your vaccines (immunizations).  Do not use any tobacco products, such as cigarettes, chewing tobacco, and e-cigarettes. If you need help quitting, ask your health care provider.  Limit alcohol intake to no more than 2 drinks per day. One drink equals 12 ounces of beer, 5 ounces of wine, or 1 ounces of hard liquor.  Do not use street drugs.  Do not share needles.  Ask your health care provider for help if you need support or information about quitting drugs.  Tell your health care provider if you often feel depressed.  Tell your health care provider if you have ever been abused or do not feel safe at home. This information is not intended to replace advice given to you by your health care provider. Make sure you discuss any questions you have with your health care provider. Document Released: 02/11/2008 Document Revised: 04/13/2016 Document Reviewed:  05/19/2015 Elsevier Interactive Patient Education  [employer].

## 2018-06-22 ENCOUNTER — Other Ambulatory Visit: Payer: Self-pay

## 2018-06-22 MED ORDER — ESOMEPRAZOLE MAGNESIUM 20 MG PO CPDR: 20.0000 mg | DELAYED_RELEASE_CAPSULE | Freq: Every day | ORAL | 1 refills | Status: DC

## 2018-06-22 MED ORDER — ATORVASTATIN CALCIUM 20 MG PO TABS: ORAL_TABLET | ORAL | 3 refills | Status: DC

## 2018-06-25 ENCOUNTER — Encounter: Payer: Self-pay | Admitting: Family Medicine

## 2019-04-16 ENCOUNTER — Other Ambulatory Visit: Payer: Self-pay

## 2019-04-16 ENCOUNTER — Telehealth: Payer: Self-pay | Admitting: Neurology

## 2019-04-16 ENCOUNTER — Encounter: Payer: Self-pay | Admitting: Neurology

## 2019-04-16 ENCOUNTER — Ambulatory Visit (INDEPENDENT_AMBULATORY_CARE_PROVIDER_SITE_OTHER): Payer: Medicare Other | Admitting: Neurology

## 2019-04-16 VITALS — BP 121/76 | HR 65 | Temp 98.0°F | Ht 75.0 in | Wt 233.0 lb

## 2019-04-16 DIAGNOSIS — R270 Ataxia, unspecified: Secondary | ICD-10-CM

## 2019-04-16 DIAGNOSIS — G43101 Migraine with aura, not intractable, with status migrainosus: Secondary | ICD-10-CM

## 2019-04-16 DIAGNOSIS — R9082 White matter disease, unspecified: Secondary | ICD-10-CM | POA: Insufficient documentation

## 2019-04-16 DIAGNOSIS — H5460 Unqualified visual loss, one eye, unspecified: Secondary | ICD-10-CM

## 2019-04-16 DIAGNOSIS — R42 Dizziness and giddiness: Secondary | ICD-10-CM

## 2019-04-16 DIAGNOSIS — G43109 Migraine with aura, not intractable, without status migrainosus: Secondary | ICD-10-CM | POA: Insufficient documentation

## 2019-04-16 DIAGNOSIS — H532 Diplopia: Secondary | ICD-10-CM

## 2019-04-16 NOTE — Patient Instructions (Addendum)
Magnesium citrate 400mg  to 600mg  daily for migraine aura MRi of the brain Blood work   Stroke Prevention Some medical conditions and behaviors are associated with a higher chance of having a stroke. You can help prevent a stroke by making nutrition, lifestyle, and other changes, including managing any medical conditions you may have. What nutrition changes can be made?   Eat healthy foods. You can do this by: ? Choosing foods high in fiber, such as fresh fruits and vegetables and whole grains. ? Eating at least 5 or more servings of fruits and vegetables a day. Try to fill half of your plate at each meal with fruits and vegetables. ? Choosing lean protein foods, such as lean cuts of meat, poultry without skin, fish, tofu, beans, and nuts. ? Eating low-fat dairy products. ? Avoiding foods that are high in salt (sodium). This can help lower blood pressure. ? Avoiding foods that have saturated fat, trans fat, and cholesterol. This can help prevent high cholesterol. ? Avoiding processed and premade foods.  Follow your health care provider's specific guidelines for losing weight, controlling high blood pressure (hypertension), lowering high cholesterol, and managing diabetes. These may include: ? Reducing your daily calorie intake. ? Limiting your daily sodium intake to 1,500 milligrams (mg). ? Using only healthy fats for cooking, such as olive oil, canola oil, or sunflower oil. ? Counting your daily carbohydrate intake. What lifestyle changes can be made?  Maintain a healthy weight. Talk to your health care provider about your ideal weight.  Get at least 30 minutes of moderate physical activity at least 5 days a week. Moderate activity includes brisk walking, biking, and swimming.  Do not use any products that contain nicotine or tobacco, such as cigarettes and e-cigarettes. If you need help quitting, ask your health care provider. It may also be helpful to avoid exposure to secondhand  smoke.  Limit alcohol intake to no more than 1 drink a day for nonpregnant women and 2 drinks a day for men. One drink equals 12 oz of beer, 5 oz of wine, or 1 oz of hard liquor.  Stop any illegal drug use.  Avoid taking birth control pills. Talk to your health care provider about the risks of taking birth control pills if: ? You are over 72 years old. ? You smoke. ? You get migraines. ? You have ever had a blood clot. What other changes can be made?  Manage your cholesterol levels. ? Eating a healthy diet is important for preventing high cholesterol. If cholesterol cannot be managed through diet alone, you may also need to take medicines. ? Take any prescribed medicines to control your cholesterol as told by your health care provider.  Manage your diabetes. ? Eating a healthy diet and exercising regularly are important parts of managing your blood sugar. If your blood sugar cannot be managed through diet and exercise, you may need to take medicines. ? Take any prescribed medicines to control your diabetes as told by your health care provider.  Control your hypertension. ? To reduce your risk of stroke, try to keep your blood pressure below 130/80. ? Eating a healthy diet and exercising regularly are an important part of controlling your blood pressure. If your blood pressure cannot be managed through diet and exercise, you may need to take medicines. ? Take any prescribed medicines to control hypertension as told by your health care provider. ? Ask your health care provider if you should monitor your blood pressure at home. ?  Have your blood pressure checked every year, even if your blood pressure is normal. Blood pressure increases with age and some medical conditions.  Get evaluated for sleep disorders (sleep apnea). Talk to your health care provider about getting a sleep evaluation if you snore a lot or have excessive sleepiness.  Take over-the-counter and prescription medicines  only as told by your health care provider. Aspirin or blood thinners (antiplatelets or anticoagulants) may be recommended to reduce your risk of forming blood clots that can lead to stroke.  Make sure that any other medical conditions you have, such as atrial fibrillation or atherosclerosis, are managed. What are the warning signs of a stroke? The warning signs of a stroke can be easily remembered as BEFAST.  B is for balance. Signs include: ? Dizziness. ? Loss of balance or coordination. ? Sudden trouble walking.  E is for eyes. Signs include: ? A sudden change in vision. ? Trouble seeing.  F is for face. Signs include: ? Sudden weakness or numbness of the face. ? The face or eyelid drooping to one side.  A is for arms. Signs include: ? Sudden weakness or numbness of the arm, usually on one side of the body.  S is for speech. Signs include: ? Trouble speaking (aphasia). ? Trouble understanding.  T is for time. ? These symptoms may represent a serious problem that is an emergency. Do not wait to see if the symptoms will go away. Get medical help right away. Call your local emergency services (911 in the [place] Do not drive yourself to the hospital.  Other signs of stroke may include: ? A sudden, severe headache with no known cause. ? Nausea or vomiting. ? Seizure. Where to find more information For more information, visit:  American Stroke Association: www.strokeassociation.org  National Stroke Association: www.stroke.org Summary  You can prevent a stroke by eating healthy, exercising, not smoking, limiting alcohol intake, and managing any medical conditions you may have.  Do not use any products that contain nicotine or tobacco, such as cigarettes and e-cigarettes. If you need help quitting, ask your health care provider. It may also be helpful to avoid exposure to secondhand smoke.  Remember BEFAST for warning signs of stroke. Get help right away if you or a loved one  has any of these signs. This information is not intended to replace advice given to you by your health care provider. Make sure you discuss any questions you have with your health care provider. Document Released: 09/22/2004 Document Revised: 07/28/2017 Document Reviewed: 09/20/2016 Elsevier Patient Education  2020 [employer].

## 2019-04-16 NOTE — Progress Notes (Signed)
[facility]  Provider: Dr Jaynee Eagles Referring Provider: Candise Che, NP Dr. Yong Channel Primary Care Physician: Garret Reddish, MD  CC: Abnormal white matter in the brain chronic microvascular changes  Interval history 04/16/2019: 68 y.o. male here as a new request from Dr. Yong Channel for ataxia, dizziness, changes of visual perception, abnormal white matter changes of brain. 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 Thornell Mule ENT., Past medical history of K-Helix piston ossiculoplasty, malleus ro neomembrane, right tympanoplasty, incus implant, HTN, HLD, Pre-diabetes.  He has advanced for age white matter changes in the brain.  He has migraines, prisms in his vision, "pixilations"in his vision, squirly that occurred around July in the setting of bike riding, he has had this in the past as well consistent with his prior episodes, he also has difficulty talking, as far back as 2007, always the same and followed by a headache. He had this multiple times in July, usually in the right eye but occurred in the left eye. The one in the left eye he had a headache, not excruciating, he has not had one since then, has difficulty with speech, 10 minutes usually. In the mornings when he stands up he feel dizzy briefly until he starts walking around. Likely orthostatic, discussed conservative measures. He is photosensitive, dizzy which is common in migraineurs. He used to see Dr. Thornell Mule for his hearing he has an implant in the right ear helix coil. We will recommend another opinion for his white matter changes. He denies any cognitive issues, he is active, looks much younger than stated age.   I reviewed notes from his inpatient stay November 2018, his girlfriend found him at the bedside, he had word finding difficulty lasting for 10 minutes and then started to have a headache which is similar to his previous headache with visual, aphasia or 8 to 9  years ago.  He had 2 episodes of migraine with aura one with visual normal speech.  Repeat imaging was stable.  EEG was normal.  Neurologic exam was normal.  He was diagnosed with complicated migraine less likely TIA a is all work-up negative per vascular neurologist Dr. Erlinda Hong.  He was asked to take aspirin 81 mg daily, prior to admission he was only taking it twice a week.  He was also discharged on Lipitor 20 mg daily (prior was only taking it twice a week).  Also advised ongoing aggressive stroke risk factor management.  Also addressed advanced white matter disease with a frontal predominance where is developing confluent signal abnormality, nonspecific pattern, likely from chronic microvascular disease at this age and uncontrolled risk factors.  Echocardiogram was unremarkable for features concerning for stroke risks, he did have mild pulmonary hypertension,  abnormal left ventricular relaxation grade 1 diastolic dysfunction, I do not see TEE.   I reviewed MRI of the brain images completed November 2018 which showed no significant changes.  I also reviewed CTA of the head and neck reports which showed no acute intracranial process or significant stenosis.  Interval history 02/10/2016: 68 y.o. male here as a referral from Dr. Yong Channel for ataxia, dizziness, changes of visual perception. 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 .  He has advanced for age white matter changes in the brain. HgbA1c 6.0, LDL was elevated at 137. Today he brings in labs values since  1994 which showed he has had hyperlipidemia since then. He also had a 10 year. Of significant stress.. He has taken Adderall for many years in the past which can elevate blood pressure but trends in the clinic recently and at home have been normal. CTA of the head and neck showed no significant extracranial or  intracranial stenosis of significance. Reviewed CTA of the head and neck which showed no significant atheroscleoris. He comes in with girlfriend today, reviewed MRi images and discussed findings with wife who was not here at last appointment.   CTA of the head and neck 01/18/2016: No extracranial or intracranial stenosis or occlusion of significance.  No abnormal postcontrast enhancement.  Hypoattenuation of white matter, better visualized on MR, consistent with advanced chronic microvascular ischemic change.   HPI: Peter Novak is a 68 y.o. male here as a referral from Dr. Yong Channel for ataxia, dizziness, changes of visual perception. 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 . He denies HTN, Diabetes, HLD, smoking, alcohol use or other vascular risk factors. He had some dizziness earlier this year in March, within 15 minutes of waking up he would have some leaning to one side, spinning feeling, he would have some imbalance on walking which would resolve. It happened for several days otherwise nothing else happened. Had several episodes. The end of April he had it for 3 days continuously, dizziness, lightheaded, no room spinning, no CP, no SOB, no weakness, no other focal neurologic deficits. No episodes since April. No FHx of autoimmune disorders, parents died in their 9s, he is generally healthy. No inciting events, no head trauma or previous illnesses. No travel overseas. Nothing made the symptoms worse or better. No history of neurologic symptoms. No family history of autoimmune disorders, neurodegenerative, diseases. No other focal neurologic deficits, no dysarthria, no dysphagia, no aphasia, no vision changes, no weakness or sensory changes. Currently he denies no gait abnormalities. He has had worsening left hearing changes with fullness in the  ear. Fullness in the ear feels better with Valsalva. Denies headaches, no family history of personal history of migraines.  Reviewed notes, labs and imaging from outside physicians, which showed: Reviewed notes by Dr. Hermina Barters. Seen in the office complaining of dizziness as a rotation torque sensation. Symptoms began 3-4 months ago spells in early March. Spells occurred in the morning or in the afternoon. Wobbly. No nausea or vomiting. He does describe decreased hearing in the left ear with a feeling of constant fullness. Feels better with Valsalva. He has a water-like feeling in both ears but no fluid draining out of either ear. Denied headaches, migraine disease. Exam reviewed patient's right tympanic membrane is stable, no middle ear effusion, no sign of infection, implant appears in place, asymmetric high frequency sensorineural hearing loss, right ear greater than left. 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  MRI of the brain 12/16/2015 (personally reviewed images and agree with the following) Normal and symmetric labyrinthine signal. No thickening or enhancement along the vestibular cochlear nerves to suggest schwannoma. No enlarged endolymphatic sac. No specific explanation for ataxia - the cerebellum and brainstem has normal size and signal. Normal appearance of the vertebral and basilar arteries.  Extensive for age white matter disease in the cerebral hemispheres with a frontal predominance where there is developing confluent signal abnormality. The pattern is nonspecific but usually  from chronic microvascular disease at this age. There is no specific demyelinating pattern.  IMPRESSION: 1. No explanation for the provided history. 2. Moderate white matter disease, nonspecific pattern but usually from chronic microvascular ischemia.   Review of Systems: Patient complains of symptoms per HPI as well as the following symptoms: ringing in ears, spinning  sensation, dizziness . Pertinent negatives per HPI. All others negative.   Social History   Socioeconomic History   Marital status: Married    Spouse name: Not on file   Number of children: Not on file   Years of education: Not on file   Highest education level: Not on file  Occupational History   Not on file  Social Needs   Financial resource strain: Not on file   Food insecurity    Worry: Not on file    Inability: Not on file   Transportation needs    Medical: Not on file    Non-medical: Not on file  Tobacco Use   Smoking status: Never Smoker   Smokeless tobacco: Never Used  Substance and Sexual Activity   Alcohol use: Yes    Alcohol/week: 1.0 standard drinks    Types: 1 Cans of beer per week    Comment: social only    Drug use: No   Sexual activity: Not on file  Lifestyle   Physical activity    Days per week: Not on file    Minutes per session: Not on file   Stress: Not on file  Relationships   Social connections    Talks on phone: Not on file    Gets together: Not on file    Attends religious service: Not on file    Active member of club or organization: Not on file    Attends meetings of clubs or organizations: Not on file    Relationship status: Not on file   Intimate partner violence    Fear of current or ex partner: Not on file    Emotionally abused: Not on file    Physically abused: Not on file    Forced sexual activity: Not on file  Other Topics Concern   Not on file  Social History Narrative   Married 2006, divorced 2010. Currently dating. 2 children (35 and 33 in 2016 in good health), no grandkids.    [employer] grad.       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       Hobbies: place in [place] in [place], outdoor activities, reading, follow things on PC      Right handed    Family History  Problem Relation Age of Onset   Diverticulosis Mother    Diabetes Father         AAA   Sudden death Brother 52       CAD; MI @ 40, smoker, sedentary, overweight, diabetes, HLD   Diabetes Brother    Hyperlipidemia Brother    Colon cancer Maternal Aunt 82   Heart attack Paternal Grandfather 69   Stroke Neg Hx    Neuropathy Neg Hx    Multiple sclerosis Neg Hx    Migraines Neg Hx    Dementia Neg Hx     Past Medical History:  Diagnosis Date   ADD (attention deficit disorder with hyperactivity)    Constipation    Hearing loss    History of skin cancer    Melanoma X 2   Hyperlipidemia     Past  Surgical History:  Procedure Laterality Date   CHOLECYSTECTOMY  2005   COLONOSCOPY  2008   negative   ELBOW SURGERY  1994   HERNIA REPAIR  2005   INNER EAR SURGERY  2006   Incus transposition;oscillculoplasty; Dr Thereasa Parkin EAR SURGERY  2011   Stirrup resection   SHOULDER ARTHROSCOPY  2004   Dr Mayer Camel   TONSILLECTOMY AND ADENOIDECTOMY     VASECTOMY      Current Outpatient Medications  Medication Sig Dispense Refill   ALPRAZolam (XANAX) 0.25 MG tablet Take 0.125 mg at bedtime as needed by mouth for anxiety.     ASPIRIN 81 PO Take 81 mg by mouth daily.     atorvastatin (LIPITOR) 20 MG tablet Take one tablet every other day. 45 tablet 3   Chlorpheniramine Maleate (ALLERGY RELIEF PO) Take 0.5 tablets by mouth as needed.     esomeprazole (NEXIUM) 20 MG capsule Take 1 capsule (20 mg total) by mouth daily. 90 capsule 1   IBUPROFEN PO Take by mouth as needed.     Multiple Vitamin (MULTIVITAMIN) tablet Take 1 tablet by mouth daily.     Psyllium (METAMUCIL PO) Take by mouth.     No current facility-administered medications for this visit.     Allergies as of 04/16/2019   (No Known Allergies)    Vitals: BP 121/76 (BP Location: Left Arm, Patient Position: Sitting)    Pulse 65    Temp 98 F (36.7 C) Comment: taken by check-in staff   Ht 6\' 3"  (1.905 m)    Wt 233 lb (105.7 kg)    BMI 29.12 kg/m  Last Weight:  Wt Readings  from Last 1 Encounters:  04/16/19 233 lb (105.7 kg)   Last Height:   Ht Readings from Last 1 Encounters:  04/16/19 6\' 3"  (1.905 m)     Physical exam: Exam: Gen: NAD, conversant, well nourised, obese, well groomed                     CV: RRR, no MRG. No Carotid Bruits. No peripheral edema, warm, nontender Eyes: Conjunctivae clear without exudates or hemorrhage  Neuro: Detailed Neurologic Exam  Speech:    Speech is normal; fluent and spontaneous with normal comprehension.  Cognition:    The patient is oriented to person, place, and time;     recent and remote memory intact;     language fluent;     normal attention, concentration,     fund of knowledge Cranial Nerves:    The pupils are equal, round, and reactive to light. The fundi are normal and spontaneous venous pulsations are present. Visual fields are full to finger confrontation. Extraocular movements are intact. Trigeminal sensation is intact and the muscles of mastication are normal. The face is symmetric. The palate elevates in the midline. Hearing intact. Voice is normal. Shoulder shrug is normal. The tongue has normal motion without fasciculations.   Coordination:    Normal finger to nose and heel to shin. Normal rapid alternating movements.   Gait:    Heel-toe and tandem gait intact with imbalance.   Motor Observation:    No asymmetry, no atrophy, and no involuntary movements noted. Tone:    Normal muscle tone.    Posture:    Posture is normal. normal erect    Strength:    Strength is V/V in the upper and lower limbs.      Sensation: intact to LT     Reflex  Exam:  DTR's:    Deep tendon reflexes in the upper and lower extremities are normal bilaterally.   Toes:    The toes are downgoing bilaterally.   Clonus:    Clonus is absent.    Assessment/Plan: 67 y.o. male here as a referral from Dr. Yong Channel for ataxia, dizziness, changes of visual perception. 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 Today has concerns of monocular vision changes, dizziness, aphasia which is likely a migraine aura but need to further evaluate given his chronic age-advanced white matter changes and his risk of stroke.  - repeat MRI brain due to concerning symptoms, monocular vision changes, dizziness, imbalance/ataxia, he is high risk for stroke, need contrast will perform both stroke and MS protocol to have all the images for evaluation. Chronic microvascular changes age advanced.   - Reviewed most recent MRI of the brain 2018 with patient which showed advanced for age white matter disease in the cerebral hemispheres with a frontal and left hemispheric predominance where there is developing confluent signal abnormality. The pattern is nonspecific but usually from chronic microvascular disease at this age. Patient's hemoglobin A1c has been elevated at 6 and he has had hyperlipidemia over the last 20 years. he has no history of alcohol use,no  smoking, or HTN, ldl was elevated at 137, HgbA1c 6.0. High blood pressure over certain number of years possibly in the setting of Adderall may have contributed as well.  - In light of the above findings I do feel that the white matter changes may still be age advanced even with his vascular risk factors. However we will follow these white matter changes and repeat MRI of the brain due to concerning symptoms.   - CTA of the head and neck 2017 showed No extracranial or intracranial stenosis or occlusion of significance. Repeat 2018 inpatient also did not show large vessel disease   -  Continue daily ASA 81 mg for stroke prevention.  - cont lipitor 20mg  qhs  - try magnesium for migraine aura, thought to  decrease spreading depression in aura  I had a long d/w patient about increased risk for stroke/TIAs, personally independently reviewed imaging studies and stroke evaluation results and answered questions.Continue ASA daily for stroke prevention and maintain strict control of hypertension with blood pressure goal below 130/90, diabetes with hemoglobin A1c goal below 6.5% and lipids with LDL cholesterol goal below 70 mg/dL.I also advised the patient to eat a healthy diet with plenty of whole grains, cereals, fruits and vegetables, exercise regularly and maintain ideal body weight .  Orders Placed This Encounter  Procedures   MR BRAIN W WO CONTRAST   Basic Metabolic Panel    Followup in the future with me in 1 year or call earlier if necessary.  Cc: Candise Che, NP Dr. Milus Banister, [facility] [address] [place], [place] [address]  Phone [phone] Fax [phone]

## 2019-04-16 NOTE — Telephone Encounter (Signed)
[employer] medicare order sent to GI. No auth they will reach out to the patient to schedule.  

## 2019-04-17 LAB — BASIC METABOLIC PANEL
BUN/Creatinine Ratio: 16 (ref 10–24)
BUN: 18 mg/dL (ref 8–27)
CO2: 23 mmol/L (ref 20–29)
Calcium: 9.7 mg/dL (ref 8.6–10.2)
Chloride: 103 mmol/L (ref 96–106)
Creatinine, Ser: 1.14 mg/dL (ref 0.76–1.27)
GFR calc Af Amer: 75 mL/min/{1.73_m2} (ref >59)
GFR calc non Af Amer: 65 mL/min/{1.73_m2} (ref >59)
Glucose: 99 mg/dL (ref 65–99)
Potassium: 5 mmol/L (ref 3.5–5.2)
Sodium: 140 mmol/L (ref 134–144)

## 2019-05-18 ENCOUNTER — Ambulatory Visit
Admission: RE | Admit: 2019-05-18 | Discharge: 2019-05-18 | Disposition: A | Discharge status: Home / Self Care | Payer: Medicare Other | Source: Ambulatory Visit | Attending: Neurology | Admitting: Neurology

## 2019-05-18 ENCOUNTER — Other Ambulatory Visit: Payer: Self-pay

## 2019-05-18 DIAGNOSIS — R42 Dizziness and giddiness: Secondary | ICD-10-CM

## 2019-05-18 DIAGNOSIS — R9082 White matter disease, unspecified: Secondary | ICD-10-CM

## 2019-05-18 DIAGNOSIS — H532 Diplopia: Secondary | ICD-10-CM | POA: Diagnosis not present

## 2019-05-18 DIAGNOSIS — H5460 Unqualified visual loss, one eye, unspecified: Secondary | ICD-10-CM

## 2019-05-18 DIAGNOSIS — R270 Ataxia, unspecified: Secondary | ICD-10-CM

## 2019-05-18 MED ORDER — GADOBENATE DIMEGLUMINE 529 MG/ML IV SOLN
20.0000 mL | Freq: Once | INTRAVENOUS | Status: AC | PRN
  Administered 2019-05-18: 20 mL via INTRAVENOUS

## 2019-06-06 ENCOUNTER — Telehealth: Payer: Self-pay | Admitting: Family Medicine

## 2019-06-06 NOTE — Telephone Encounter (Signed)
I called the patient to schedule AWV with Loma Sousa, but he declined. VDM (DD)

## 2019-07-18 ENCOUNTER — Other Ambulatory Visit: Payer: Self-pay | Admitting: Family Medicine

## 2019-10-08 DIAGNOSIS — L570 Actinic keratosis: Secondary | ICD-10-CM | POA: Diagnosis not present

## 2019-10-08 DIAGNOSIS — L820 Inflamed seborrheic keratosis: Secondary | ICD-10-CM | POA: Diagnosis not present

## 2019-10-08 DIAGNOSIS — Z85828 Personal history of other malignant neoplasm of skin: Secondary | ICD-10-CM | POA: Diagnosis not present

## 2019-10-08 DIAGNOSIS — Z85820 Personal history of malignant melanoma of skin: Secondary | ICD-10-CM | POA: Diagnosis not present

## 2019-10-16 ENCOUNTER — Other Ambulatory Visit: Payer: Self-pay | Admitting: Family Medicine

## 2019-10-22 ENCOUNTER — Encounter: Payer: Self-pay | Admitting: Family Medicine

## 2019-10-23 ENCOUNTER — Telehealth: Payer: Self-pay

## 2019-10-23 NOTE — Telephone Encounter (Signed)
Ok to schedule him a lab visit prior to CPE.

## 2019-10-23 NOTE — Telephone Encounter (Signed)
Patient is scheduled for physical 12/20/19 patient would like to have blood work done the day before schedule appt

## 2019-10-29 ENCOUNTER — Encounter: Payer: Self-pay | Admitting: Family Medicine

## 2019-10-29 DIAGNOSIS — Z125 Encounter for screening for malignant neoplasm of prostate: Secondary | ICD-10-CM

## 2019-10-29 DIAGNOSIS — Z0000 Encounter for general adult medical examination without abnormal findings: Secondary | ICD-10-CM

## 2019-10-29 DIAGNOSIS — E782 Mixed hyperlipidemia: Secondary | ICD-10-CM

## 2019-10-29 DIAGNOSIS — R739 Hyperglycemia, unspecified: Secondary | ICD-10-CM

## 2019-11-01 ENCOUNTER — Other Ambulatory Visit: Payer: Self-pay

## 2019-11-01 ENCOUNTER — Other Ambulatory Visit (INDEPENDENT_AMBULATORY_CARE_PROVIDER_SITE_OTHER): Payer: Medicare PPO

## 2019-11-01 ENCOUNTER — Ambulatory Visit: Payer: Medicare Other | Admitting: Family Medicine

## 2019-11-01 DIAGNOSIS — Z0000 Encounter for general adult medical examination without abnormal findings: Secondary | ICD-10-CM | POA: Diagnosis not present

## 2019-11-01 DIAGNOSIS — E782 Mixed hyperlipidemia: Secondary | ICD-10-CM

## 2019-11-01 DIAGNOSIS — Z125 Encounter for screening for malignant neoplasm of prostate: Secondary | ICD-10-CM

## 2019-11-01 DIAGNOSIS — R739 Hyperglycemia, unspecified: Secondary | ICD-10-CM | POA: Diagnosis not present

## 2019-11-01 LAB — COMPREHENSIVE METABOLIC PANEL
ALT: 25 U/L (ref 0–53)
AST: 19 U/L (ref 0–37)
Albumin: 4.4 g/dL (ref 3.5–5.2)
Alkaline Phosphatase: 69 U/L (ref 39–117)
BUN: 23 mg/dL (ref 6–23)
CO2: 27 mEq/L (ref 19–32)
Calcium: 9.7 mg/dL (ref 8.4–10.5)
Chloride: 107 mEq/L (ref 96–112)
Creatinine, Ser: 1.17 mg/dL (ref 0.40–1.50)
GFR: 61.6 mL/min (ref >60.00)
Glucose, Bld: 117 mg/dL — ABNORMAL HIGH (ref 70–99)
Potassium: 4.3 mEq/L (ref 3.5–5.1)
Sodium: 142 mEq/L (ref 135–145)
Total Bilirubin: 1 mg/dL (ref 0.2–1.2)
Total Protein: 7 g/dL (ref 6.0–8.3)

## 2019-11-01 LAB — CBC WITH DIFFERENTIAL/PLATELET
Basophils Absolute: 0 10*3/uL (ref 0.0–0.1)
Basophils Relative: 0.6 % (ref 0.0–3.0)
Eosinophils Absolute: 0.1 10*3/uL (ref 0.0–0.7)
Eosinophils Relative: 2.3 % (ref 0.0–5.0)
HCT: 42 % (ref 39.0–52.0)
Hemoglobin: 14.2 g/dL (ref 13.0–17.0)
Lymphocytes Relative: 40 % (ref 12.0–46.0)
Lymphs Abs: 2.1 10*3/uL (ref 0.7–4.0)
MCHC: 33.8 g/dL (ref 30.0–36.0)
MCV: 89 fl (ref 78.0–100.0)
Monocytes Absolute: 0.6 10*3/uL (ref 0.1–1.0)
Monocytes Relative: 11.1 % (ref 3.0–12.0)
Neutro Abs: 2.4 10*3/uL (ref 1.4–7.7)
Neutrophils Relative %: 46 % (ref 43.0–77.0)
Platelets: 202 10*3/uL (ref 150.0–400.0)
RBC: 4.72 Mil/uL (ref 4.22–5.81)
RDW: 13.7 % (ref 11.5–15.5)
WBC: 5.2 10*3/uL (ref 4.0–10.5)

## 2019-11-01 LAB — HEMOGLOBIN A1C: Hgb A1c MFr Bld: 5.9 % (ref 4.6–6.5)

## 2019-11-01 LAB — LIPID PANEL
Cholesterol: 170 mg/dL (ref 0–200)
HDL: 42.6 mg/dL (ref >39.00)
NonHDL: 127.21
Total CHOL/HDL Ratio: 4
Triglycerides: 210 mg/dL — ABNORMAL HIGH (ref 0.0–149.0)
VLDL: 42 mg/dL — ABNORMAL HIGH (ref 0.0–40.0)

## 2019-11-01 LAB — PSA: PSA: 7.72 ng/mL — ABNORMAL HIGH (ref 0.10–4.00)

## 2019-11-01 LAB — TSH: TSH: 2.53 u[IU]/mL (ref 0.35–4.50)

## 2019-11-01 LAB — LDL CHOLESTEROL, DIRECT: Direct LDL: 92 mg/dL

## 2019-11-05 ENCOUNTER — Other Ambulatory Visit: Payer: Self-pay

## 2019-11-05 ENCOUNTER — Ambulatory Visit: Payer: Medicare PPO | Admitting: Family Medicine

## 2019-11-05 ENCOUNTER — Encounter: Payer: Self-pay | Admitting: Family Medicine

## 2019-11-05 VITALS — BP 138/68 | HR 58 | Temp 98.2°F | Ht 75.0 in | Wt 240.0 lb

## 2019-11-05 DIAGNOSIS — C61 Malignant neoplasm of prostate: Secondary | ICD-10-CM | POA: Diagnosis not present

## 2019-11-05 DIAGNOSIS — R42 Dizziness and giddiness: Secondary | ICD-10-CM

## 2019-11-05 DIAGNOSIS — Z0000 Encounter for general adult medical examination without abnormal findings: Secondary | ICD-10-CM | POA: Diagnosis not present

## 2019-11-05 DIAGNOSIS — K219 Gastro-esophageal reflux disease without esophagitis: Secondary | ICD-10-CM | POA: Diagnosis not present

## 2019-11-05 DIAGNOSIS — Z85820 Personal history of malignant melanoma of skin: Secondary | ICD-10-CM | POA: Diagnosis not present

## 2019-11-05 DIAGNOSIS — G43101 Migraine with aura, not intractable, with status migrainosus: Secondary | ICD-10-CM

## 2019-11-05 DIAGNOSIS — G4700 Insomnia, unspecified: Secondary | ICD-10-CM | POA: Diagnosis not present

## 2019-11-05 DIAGNOSIS — R739 Hyperglycemia, unspecified: Secondary | ICD-10-CM | POA: Diagnosis not present

## 2019-11-05 DIAGNOSIS — E782 Mixed hyperlipidemia: Secondary | ICD-10-CM | POA: Diagnosis not present

## 2019-11-05 DIAGNOSIS — R001 Bradycardia, unspecified: Secondary | ICD-10-CM | POA: Diagnosis not present

## 2019-11-05 MED ORDER — ATORVASTATIN CALCIUM 20 MG PO TABS: 20.0000 mg | ORAL_TABLET | Freq: Every day | ORAL | 3 refills | Status: DC

## 2019-11-05 MED ORDER — ESOMEPRAZOLE MAGNESIUM 20 MG PO CPDR: 20.0000 mg | DELAYED_RELEASE_CAPSULE | Freq: Every day | ORAL | 2 refills | Status: DC

## 2019-11-05 NOTE — Assessment & Plan Note (Signed)
S: Patient states most mornings when he wakes up his heart rate is in the 45-50 range.  It did get as low as 42 one morning and it was associated with fatigue as well as the above listed dizziness/ataxia.  Typically lower heart rate is not associated with any symptoms such as dizziness/chest pain/shortness of breath/fatigue A/P: Based on patient's fatigue symptoms and bradycardia I have been concerned about hypothyroidism-TSH was normal though.  I reviewed back and saw EKG from July 06, 2017 showing sinus bradycardia at 49 so this appears to be largely his baseline.  I did offer cardiology referral given dizziness associated with one episode of bradycardia-since that has not been recurrent he wants to hold off for now with this referral.  He does report occasional chest pressure in the morning as well but if he burps several times this resolves and we did not opt to pursue further cardiac work-up as a result.

## 2019-11-05 NOTE — Assessment & Plan Note (Signed)
S: Patient reports long-term issues with dizziness/ataxia.  This has been an ongoing issue for patient for years.  He continues to get intermittent periods where he feels dizzy/ataxic.  No ringing in the ears above baseline or hearing loss above baseline with these episodes.  He had a prior work-up with Dr. Thornell Mule of ENT and was later referred to Dr. Jaynee Eagles.  Patient also sent me a MyChart message on March 2 and complained of weight gain over 10 pounds, feeling cold frequently-recurrence of dizzy episodes which seem to be triggered by 1 morning when heart rate got as low as 42.  He also has had some increased joint stiffness.  He was very concerned about his thyroid which I told him was very appropriate concern.  Patient also has had work-up for what was ultimately thought to be an complicated migraine instead of TIA when he had a period of altered speech in November 2018-did have neuro imaging which showed advanced white matter disease-initially there was concern for demyelination  Most recently he was seen by Dr. Jaynee Eagles for this April 16, 2019-most pertinent findings from her assessment and plan " 69 y.o. male here as a referral from Dr. Yong Channel for ataxia, dizziness, changes of visual perception. 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 Today has concerns of monocular vision changes, dizziness, aphasia which is likely a migraine aura but need to further evaluate given his chronic age-advanced white matter changes and his risk of stroke.  - repeat MRI brain due to concerning symptoms, monocular vision changes, dizziness, imbalance/ataxia, he is high risk for stroke, need contrast will perform both stroke and MS protocol to have all  the images for evaluation. Chronic microvascular changes age advanced.   - Reviewed most recent MRI of the brain 2018 with patient which showed advanced for age white matter disease in the cerebral hemispheres with a frontal and left hemispheric predominance where there is developing confluent signal abnormality. The pattern is nonspecific but usually from chronic microvascular disease at this age. Patient's hemoglobin A1c has been elevated at 6 and he has had hyperlipidemia over the last 20 years. he has no history of alcohol use,no  smoking, or HTN, ldl was elevated at 137, HgbA1c 6.0. High blood pressure over certain number of years possibly in the setting of Adderall may have contributed as well.  - In light of the above findings I do feel that the white matter changes may still be age advanced even with his vascular risk factors. However we will follow these white matter changes and repeat MRI of the brain due to concerning symptoms. "  I reviewed the MRI report from May 18, 2019 which shows the following "IMPRESSION: This MRI of the brain with and without contrast shows the following: 1.   There are multiple T2/flair hyperintense foci in the deep and subcortical white matter and only a few in the periventricular white matter.  There are no foci noted in the infratentorial white matter.  This pattern is most consistent with moderately severe chronic microvascular ischemic change.  Demyelination would be less likely to have this pattern. 2.    There is a normal enhancement pattern and no acute findings."  Please note on follow-up MRI there was not concern for demyelination but  instead it was thought the patient had moderately severe chronic microvascular ischemic change A/P: Patient has had extensive work-up for dizziness/ataxia with both ENT and neurology without clear cause.  Ultimately the only clear finding is moderately severe chronic microvascular ischemic changes without obvious  stroke-encouraged patient to continue follow-up with Dr. Jaynee Eagles at this point-given this has been a recurrent issue over the years I am not sure what else can be done-we can also get an updated opinion from ENT in the future if needed-prior flareup from a few weeks ago has come back down to normal and we did not decide to pursue further.  See hyperlipidemia discussion in relation to lowering risk factors for progression of microvascular ischemia.  See repeat MRI above September 2020 which did not have concern for demyelination

## 2019-11-05 NOTE — Progress Notes (Signed)
Phone [phone] In person visit   Subjective:   Peter Novak is a 69 y.o. year old very pleasant male patient who presents for/with See problem oriented charting Chief Complaint  Patient presents with  . lab work and fatigue   This visit occurred during the SARS-CoV-2 public health emergency.  Safety protocols were in place, including screening questions prior to the visit, additional usage of staff PPE, and extensive cleaning of exam room while observing appropriate contact time as indicated for disinfecting solutions.   Past Medical History-  Patient Active Problem List   Diagnosis Date Noted  . Recurrent episodes Dizziness/Ataxia 11/05/2019    Priority: High  . White matter abnormality on MRI of brain 04/16/2019    Priority: High  . Migraine with aura and with status migrainosus, not intractable 04/16/2019    Priority: High  . Ocular migraine 04/16/2019    Priority: High  . ADD (attention deficit disorder) 10/02/2014    Priority: Medium  . Insomnia 10/02/2014    Priority: Medium  . Osteoarthritis 10/02/2014    Priority: Medium  . Prostate cancer ([place]) 02/12/2013    Priority: Medium  . Hyperglycemia 11/30/2009    Priority: Medium  . History of melanoma 11/30/2009    Priority: Medium  . HYPERLIPIDEMIA 07/10/2007    Priority: Medium  . Bradycardia 11/05/2019    Priority: Low  . Migraine aura without headache (migraine equivalents) 04/16/2019    Priority: Low  . Erectile dysfunction 10/02/2014    Priority: Low  . Eustachian tube dysfunction 10/02/2014    Priority: Low  . GERD (gastroesophageal reflux disease) 02/12/2013    Priority: Low  . Benign neoplasm of adrenal gland 11/30/2009    Priority: Low    Medications- reviewed and updated Current Outpatient Medications  Medication Sig Dispense Refill  . ALPRAZolam (XANAX) 0.25 MG tablet Take 0.125 mg at bedtime as needed by mouth for anxiety.    . ASPIRIN 81 PO Take 81 mg by mouth daily.    [name] Kitchen atorvastatin  (LIPITOR) 20 MG tablet Take 1 tablet (20 mg total) by mouth daily. 90 tablet 3  . Chlorpheniramine Maleate (ALLERGY RELIEF PO) Take 0.5 tablets by mouth as needed.    [name] Kitchen esomeprazole (NEXIUM) 20 MG capsule Take 1 capsule (20 mg total) by mouth daily. 90 capsule 2  . IBUPROFEN PO Take by mouth as needed.    . Multiple Vitamin (MULTIVITAMIN) tablet Take 1 tablet by mouth daily.    . Psyllium (METAMUCIL PO) Take by mouth.     No current facility-administered medications for this visit.     Objective:  BP 138/68   Pulse (!) 58   Temp 98.2 F (36.8 C)   Ht 6\' 3"  (1.905 m)   Wt 240 lb (108.9 kg)   SpO2 96%   BMI 30.00 kg/m  Gen: NAD, resting comfortably     Assessment and Plan   #Dizziness/ataxia/fatigue S: Patient reports long-term issues with dizziness/ataxia.  This has been an ongoing issue for patient for years.  He continues to get intermittent periods where he feels dizzy/ataxic.  No ringing in the ears above baseline or hearing loss above baseline with these episodes.  He had a prior work-up with Dr. Thornell Mule of ENT and was later referred to Dr. Jaynee Eagles.  Patient also sent me a MyChart message on March 2 and complained of weight gain over 10 pounds, feeling cold frequently-recurrence of dizzy episodes which seem to be triggered by 1 morning when heart rate got as  low as 42.  He also has had some increased joint stiffness.  He was very concerned about his thyroid which I told him was very appropriate concern.  Patient also has had work-up for what was ultimately thought to be an complicated migraine instead of TIA when he had a period of altered speech in November 2018-did have neuro imaging which showed advanced white matter disease-initially there was concern for demyelination  Most recently he was seen by Dr. Jaynee Eagles for this April 16, 2019-most pertinent findings from her assessment and plan " 69 y.o. male here as a referral from Dr. Yong Channel for ataxia, dizziness, changes of visual  perception. 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 Today has concerns of monocular vision changes, dizziness, aphasia which is likely a migraine aura but need to further evaluate given his chronic age-advanced white matter changes and his risk of stroke.  - repeat MRI brain due to concerning symptoms, monocular vision changes, dizziness, imbalance/ataxia, he is high risk for stroke, need contrast will perform both stroke and MS protocol to have all the images for evaluation. Chronic microvascular changes age advanced.   - Reviewed most recent MRI of the brain 2018 with patient which showed advanced for age white matter disease in the cerebral hemispheres with a frontal and left hemispheric predominance where there is developing confluent signal abnormality. The pattern is nonspecific but usually from chronic microvascular disease at this age. Patient's hemoglobin A1c has been elevated at 6 and he has had hyperlipidemia over the last 20 years. he has no history of alcohol use,no  smoking, or HTN, ldl was elevated at 137, HgbA1c 6.0. High blood pressure over certain number of years possibly in the setting of Adderall may have contributed as well.  - In light of the above findings I do feel that the white matter changes may still be age advanced even with his vascular risk factors. However we will follow these white matter changes and repeat MRI of the brain due to concerning symptoms. "  I reviewed the MRI report from May 18, 2019 which shows the following "IMPRESSION: This MRI of the brain with and without contrast shows the following: 1.   There are multiple T2/flair hyperintense foci in the deep and subcortical white  matter and only a few in the periventricular white matter.  There are no foci noted in the infratentorial white matter.  This pattern is most consistent with moderately severe chronic microvascular ischemic change.  Demyelination would be less likely to have this pattern. 2.    There is a normal enhancement pattern and no acute findings."  Please note on follow-up MRI there was not concern for demyelination but instead it was thought the patient had moderately severe chronic microvascular ischemic change A/P: Patient has had extensive work-up for dizziness/ataxia with both ENT and neurology without clear cause.  Ultimately the only clear finding is moderately severe chronic microvascular ischemic changes without obvious stroke-encouraged patient to continue follow-up with Dr. Jaynee Eagles at this point-given this has been a recurrent issue over the years I am not sure what else can be done-we can also get an updated opinion from ENT in the future if needed-prior flareup from a few weeks ago has come back down to normal and we  did not decide to pursue further.  See hyperlipidemia discussion in relation to lowering risk factors for progression of microvascular ischemia.  See repeat MRI above September 2020 which did not have concern for demyelination   #Bradycardia S: Patient states most mornings when he wakes up his heart rate is in the 45-50 range.  It did get as low as 42 one morning and it was associated with fatigue as well as the above listed dizziness/ataxia.  Typically lower heart rate is not associated with any symptoms such as dizziness/chest pain/shortness of breath/fatigue A/P: Based on patient's fatigue symptoms and bradycardia I have been concerned about hypothyroidism-TSH was normal though.  I reviewed back and saw EKG from July 06, 2017 showing sinus bradycardia at 49 so this appears to be largely his baseline.  I did offer cardiology referral given dizziness associated with one episode of  bradycardia-since that has not been recurrent he wants to hold off for now with this referral.  He does report occasional chest pressure in the morning as well but if he burps several times this resolves and we did not opt to pursue further cardiac work-up as a result.  Patient does take some ADD medications but takes these infrequently-I have seen some cases where patients have chronotropic dependence on ADD medicines and heart rate lowers afterwards but I doubt that is the case with infrequent dosing   #High risk medication use-patient reports sparing ibuprofen.  We discussed this can affect kidney function-fortunately renal function was acceptable-continue to monitor renal function at least every 6 to 12 months  #hyperlipidemia S: compliant with atorvastatin 20 mg every other week.  Advanced microvascular ischemia noted on MRI September 2020 Lab Results  Component Value Date   CHOL 170 11/01/2019   HDL 42.60 11/01/2019   LDLCALC 68 06/21/2018   LDLDIRECT 92.0 11/01/2019   TRIG 210.0 (H) 11/01/2019   CHOLHDL 4 11/01/2019   A/P: Poor control considering advanced microvascular ischemia on the brain-LDL over 70- want to push under this so will increase atorvastatin 20mg  dialy -Consider CMP and direct LDL at follow-up  # Hyperglycemia/insulin resistance/prediabetes S: Exercise and diet-patient is walking regularly and reports a healthy diet but continues to struggle with weight gain Lab Results  Component Value Date   HGBA1C 5.9 11/01/2019   HGBA1C 6.0 06/21/2018   HGBA1C 6.0 08/14/2017    A/P: Patient reported his typical diet and appears very reasonable and low calorie-I actually wanted him to monitor calorie count and make sure not significantly depressed-if so would want him to at least have 1300 to 1400 cal a day but on the other hand I want a calorie count to make sure he is not missing some excess calories.  Likely A1c is at least improving-continue to monitor-he has not been  interested in Metformin in the past   Recommended follow up: As needed for acute concerns-plan for 102-month weight check and could consider updating blood work Future Appointments  Date Time Provider [place]  12/19/2019  8:30 AM [facility]  12/20/2019  2:40 PM Marin Olp, MD [facility]    Lab/Order associations:   ICD-10-CM   2. Dizziness  R42   3. Bradycardia  R00.1   4. HYPERLIPIDEMIA  E78.2   5. Hyperglycemia  R73.9     Meds ordered this encounter  . atorvastatin (LIPITOR) 20 MG tablet    Sig: Take 1 tablet (20 mg total) by mouth daily.    Dispense:  90 tablet  Refill:  3    Return precautions advised.  Garret Reddish, MD

## 2019-11-05 NOTE — Patient Instructions (Addendum)
Nutritionfacts.org and search prediabetes- lots of good info about plant based diet  I want you to at least have 1300 or 1400 calories per day- sometimes metabolism slows if consistently below this- may be worth looking at a standard week for you on myfitnesspal  We can refer to cardiology if have any symptoms with the low heart rate  Increase atorvastatin to daily  Consider nutrition referral if continue to have issues with weight gain. Would be very reasonable to do a 6 month check in on weight if youd like- definitely a physical in a year

## 2019-11-05 NOTE — Progress Notes (Signed)
Phone: [phone]   Subjective:  Patient presents today for their annual physical. Chief complaint-noted.   See problem oriented charting- ROS- full  review of systems was completed and negative  except for: Issues with fatigue as well as dizziness and bradycardia-see problem-oriented charting  The following were reviewed and entered/updated in epic: Past Medical History:  Diagnosis Date  . ADD (attention deficit disorder with hyperactivity)   . Constipation   . Hearing loss   . History of skin cancer    Melanoma X 2  . Hyperlipidemia    Patient Active Problem List   Diagnosis Date Noted  . TIA (transient ischemic attack) 07/03/2017    Priority: High  . ADD (attention deficit disorder) 10/02/2014    Priority: Medium  . Insomnia 10/02/2014    Priority: Medium  . Osteoarthritis 10/02/2014    Priority: Medium  . Prostate cancer ([place]) 02/12/2013    Priority: Medium  . Hyperglycemia 11/30/2009    Priority: Medium  . History of melanoma 11/30/2009    Priority: Medium  . HYPERLIPIDEMIA 07/10/2007    Priority: Medium  . Erectile dysfunction 10/02/2014    Priority: Low  . Eustachian tube dysfunction 10/02/2014    Priority: Low  . GERD (gastroesophageal reflux disease) 02/12/2013    Priority: Low  . Benign neoplasm of adrenal gland 11/30/2009    Priority: Low  . White matter abnormality on MRI of brain 04/16/2019  . Migraine with aura and with status migrainosus, not intractable 04/16/2019  . Ocular migraine 04/16/2019  . Migraine aura without headache (migraine equivalents) 04/16/2019   Past Surgical History:  Procedure Laterality Date  . CHOLECYSTECTOMY  2005  . COLONOSCOPY  2008   negative  . ELBOW SURGERY  1994  . HERNIA REPAIR  2005  . INNER EAR SURGERY  2006   Incus transposition;oscillculoplasty; Dr Thornell Mule  . INNER EAR SURGERY  2011   Stirrup resection  . SHOULDER ARTHROSCOPY  2004   Dr Mayer Camel  . TONSILLECTOMY AND ADENOIDECTOMY    . VASECTOMY       Family History  Problem Relation Age of Onset  . Diverticulosis Mother   . Diabetes Father        AAA  . Sudden death Brother 30       CAD; MI @ 47, smoker, sedentary, overweight, diabetes, HLD  . Diabetes Brother   . Hyperlipidemia Brother   . Colon cancer Maternal Aunt 82  . Heart attack Paternal Grandfather 71  . Stroke Neg Hx   . Neuropathy Neg Hx   . Multiple sclerosis Neg Hx   . Migraines Neg Hx   . Dementia Neg Hx     Medications- reviewed and updated Current Outpatient Medications  Medication Sig Dispense Refill  . ALPRAZolam (XANAX) 0.25 MG tablet Take 0.125 mg at bedtime as needed by mouth for anxiety.    . ASPIRIN 81 PO Take 81 mg by mouth daily.    [name] Kitchen atorvastatin (LIPITOR) 20 MG tablet TAKE ONE TABLET BY MOUTH EVERY OTHER DAY 15 tablet 0  . Chlorpheniramine Maleate (ALLERGY RELIEF PO) Take 0.5 tablets by mouth as needed.    [name] Kitchen esomeprazole (NEXIUM) 20 MG capsule Take 1 capsule (20 mg total) by mouth daily. 90 capsule 2  . IBUPROFEN PO Take by mouth as needed.    . Multiple Vitamin (MULTIVITAMIN) tablet Take 1 tablet by mouth daily.    . Psyllium (METAMUCIL PO) Take by mouth.     Allergies-reviewed and updated No Known Allergies  Social  History   Social History Narrative   Married 2006, divorced 2010. Currently dating. 2 children (35 and 33 in 2016 in good health), no grandkids.    [employer] grad.       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       Hobbies: place in [place] in [place], outdoor activities, reading, follow things on PC      Right handed   Objective  Objective:  BP 138/68   Pulse (!) 58   Temp 98.2 F (36.8 C)   Ht 6\' 3"  (1.905 m)   Wt 240 lb (108.9 kg)   SpO2 96%   BMI 30.00 kg/m  Gen: NAD, resting comfortably HEENT: Mucous membranes are moist. Oropharynx normal Neck: no thyromegaly CV: Bradycardic but regular no murmurs rubs or gallops Lungs: CTAB no crackles, wheeze,  rhonchi Abdomen: soft/nontender/nondistended/normal bowel sounds. No rebound or guarding.  Ext: no edema Skin: warm, dry Neuro: grossly normal, moves all extremities, PERRLA    Assessment and Plan  69 y.o. male presenting for annual physical.  Health Maintenance counseling: 1. Anticipatory guidance: Patient counseled regarding regular dental exams -yearly, eye exams -yearly,  avoiding smoking and second hand smoke , limiting alcohol to 2 beverages per day - glass of wine in evening.   2. Risk factor reduction:  Advised patient of need for regular exercise and diet rich and fruits and vegetables to reduce risk of heart attack and stroke. Exercise- mile and half to 3 miles a week for 5 days a week. Diet-chicken/fish mainly. Protein shake for lunch. Very small dessert occasionally like hersheys kiss. We talked about making sure he is at least getting 1300 or 1400 calories. He may consider keto diet as well Wt Readings from Last 3 Encounters:  11/05/19 240 lb (108.9 kg)  04/16/19 233 lb (105.7 kg)  06/21/18 229 lb (103.9 kg)  3. Immunizations/screenings/ancillary studies- encourage dhim to get covid 19 vaccine- otherwise he is up to date Immunization History  Administered Date(s) Administered  . Influenza, High Dose Seasonal PF 06/02/2017  . Influenza,inj,Quad PF,6+ Mos 06/13/2019  . Influenza,inj,quad, With Preservative 06/19/2015  . Influenza-Unspecified 06/12/2014, 05/29/2018  . Pneumococcal Conjugate-13 10/02/2014  . Pneumococcal Polysaccharide-23 10/14/2015  . Td 11/30/2009  . Tdap 09/30/2015  . Zoster 03/23/2011  . Zoster Recombinat (Shingrix) 06/07/2018  4. Prostate cancer surveillance-  active surveillance with [facility] Gleason 3+3 = 6 on same section- plant is for continued surveillance Lab Results  Component Value Date   PSA 7.72 (H) 11/01/2019   PSA 7.92 03/22/2018   PSA 7.64 06/05/2017   5. Colon cancer screening - 04/2017 with 5 year follow up  With Dr. Silverio Decamp 6.  Skin cancer screening- Dr. Jarome Matin with history of melanoma. advised regular sunscreen use. Denies worrisome, changing, or new skin lesions.  7. never smoker  Status of chronic or acute concerns   See discussion above about weight loss-as per AVS "Consider nutrition referral if continue to have issues with weight gain. Would be very reasonable to do a 6 month check in on weight if youd like- definitely a physical in a year " and also recommended nutrition facts.org and focus on plant-based nutrition  Patient is still on Xanax for sleep prescribed by psychiatry  Patient also on Nexium 20 mg for acid reflux-consider B12 with next labs  See problem-oriented note as well from today  Recommended follow up: 66-month weight check or sooner if needed-admin team will call to  cancel April physical since he completed this today Future Appointments  Date Time Provider [place]  12/19/2019  8:30 AM [facility]  12/20/2019  2:40 PM Yong Channel Brayton Mars, MD [facility]    Lab/Order associations: Already had fasting labs   ICD-10-CM   1. Preventative health care  Z00.00   2. Prostate cancer ([place])  C61   3. Insomnia, unspecified type  G47.00   4. History of melanoma  Z85.820   5. Gastroesophageal reflux disease without esophagitis  K21.9     Meds ordered this encounter  Medications  . esomeprazole (NEXIUM) 20 MG capsule    Sig: Take 1 capsule (20 mg total) by mouth daily.    Dispense:  90 capsule    Refill:  2    Return precautions advised.  Garret Reddish, MD

## 2019-11-05 NOTE — Assessment & Plan Note (Signed)
S: compliant with atorvastatin 20 mg every other week.  Advanced microvascular ischemia noted on MRI September 2020 Lab Results  Component Value Date   CHOL 170 11/01/2019   HDL 42.60 11/01/2019   LDLCALC 68 06/21/2018   LDLDIRECT 92.0 11/01/2019   TRIG 210.0 (H) 11/01/2019   CHOLHDL 4 11/01/2019   A/P: Poor control considering advanced microvascular ischemia on the brain-LDL over 70- want to push under this so will increase atorvastatin 20mg  dialy -Consider CMP and direct LDL at follow-up

## 2019-11-05 NOTE — Assessment & Plan Note (Signed)
S: Exercise and diet-patient is walking regularly and reports a healthy diet but continues to struggle with weight gain Lab Results  Component Value Date   HGBA1C 5.9 11/01/2019   HGBA1C 6.0 06/21/2018   HGBA1C 6.0 08/14/2017    A/P: Patient reported his typical diet and appears very reasonable and low calorie-I actually wanted him to monitor calorie count and make sure not significantly depressed-if so would want him to at least have 1300 to 1400 cal a day but on the other hand I want a calorie count to make sure he is not missing some excess calories.  Likely A1c is at least improving-continue to monitor-he has not been interested in Metformin in the past

## 2019-11-06 NOTE — Progress Notes (Signed)
Called and LVM for pt to schedule 6 month f/u

## 2019-11-14 ENCOUNTER — Ambulatory Visit: Payer: Medicare PPO | Attending: Internal Medicine

## 2019-11-14 DIAGNOSIS — Z23 Encounter for immunization: Secondary | ICD-10-CM

## 2019-11-14 NOTE — Progress Notes (Signed)
   Covid-19 Vaccination Clinic  Name:  KARCH WALDREN    MRN: [id number] DOB: [birth year]  11/14/2019  Mr. Milke was observed post Covid-19 immunization for 15 minutes without incident. He was provided with Vaccine Information Sheet and instruction to access the V-Safe system.   Mr. Kauk was instructed to call 911 with any severe reactions post vaccine: [name] Kitchen Difficulty breathing  . Swelling of face and throat  . A fast heartbeat  . A bad rash all over body  . Dizziness and weakness   Immunizations Administered    Name Date Dose VIS Date Route   Pfizer COVID-19 Vaccine 11/14/2019  9:29 AM 0.3 mL 08/09/2019 Intramuscular   Manufacturer: [place]   Lot: [id number]   [place]: [id number]

## 2019-11-25 DIAGNOSIS — R69 Illness, unspecified: Secondary | ICD-10-CM | POA: Diagnosis not present

## 2019-12-09 ENCOUNTER — Ambulatory Visit: Payer: Medicare PPO | Attending: Internal Medicine

## 2019-12-09 DIAGNOSIS — Z23 Encounter for immunization: Secondary | ICD-10-CM

## 2019-12-09 NOTE — Progress Notes (Signed)
   Covid-19 Vaccination Clinic  Name:  Peter Novak    MRN: [id number] DOB: Jun 06, 1951  12/09/2019  [name] was observed post Covid-19 immunization for 15 minutes without incident. He was provided with Vaccine Information Sheet and instruction to access the V-Safe system.   [name] was instructed to call 911 with any severe reactions post vaccine: [name] Kitchen Difficulty breathing  . Swelling of face and throat  . A fast heartbeat  . A bad rash all over body  . Dizziness and weakness   Immunizations Administered    Name Date Dose VIS Date Route   Pfizer COVID-19 Vaccine 12/09/2019  9:48 AM 0.3 mL 08/09/2019 Intramuscular   Manufacturer: [place]   Lot: [id number]   [place]: [id number]

## 2019-12-19 ENCOUNTER — Other Ambulatory Visit: Payer: Medicare Other

## 2019-12-20 ENCOUNTER — Encounter: Payer: Medicare Other | Admitting: Family Medicine

## 2020-01-09 DIAGNOSIS — H524 Presbyopia: Secondary | ICD-10-CM | POA: Diagnosis not present

## 2020-01-09 DIAGNOSIS — H52223 Regular astigmatism, bilateral: Secondary | ICD-10-CM | POA: Diagnosis not present

## 2020-01-09 DIAGNOSIS — H43393 Other vitreous opacities, bilateral: Secondary | ICD-10-CM | POA: Diagnosis not present

## 2020-01-09 DIAGNOSIS — H2513 Age-related nuclear cataract, bilateral: Secondary | ICD-10-CM | POA: Diagnosis not present

## 2020-01-09 DIAGNOSIS — H5203 Hypermetropia, bilateral: Secondary | ICD-10-CM | POA: Diagnosis not present

## 2020-03-03 DIAGNOSIS — L820 Inflamed seborrheic keratosis: Secondary | ICD-10-CM | POA: Diagnosis not present

## 2020-03-03 DIAGNOSIS — L738 Other specified follicular disorders: Secondary | ICD-10-CM | POA: Diagnosis not present

## 2020-03-03 DIAGNOSIS — L570 Actinic keratosis: Secondary | ICD-10-CM | POA: Diagnosis not present

## 2020-03-03 DIAGNOSIS — L821 Other seborrheic keratosis: Secondary | ICD-10-CM | POA: Diagnosis not present

## 2020-03-03 DIAGNOSIS — Z85820 Personal history of malignant melanoma of skin: Secondary | ICD-10-CM | POA: Diagnosis not present

## 2020-03-03 DIAGNOSIS — Z85828 Personal history of other malignant neoplasm of skin: Secondary | ICD-10-CM | POA: Diagnosis not present

## 2020-03-26 ENCOUNTER — Encounter: Payer: Self-pay | Admitting: Family Medicine

## 2020-04-21 DIAGNOSIS — M25511 Pain in right shoulder: Secondary | ICD-10-CM | POA: Diagnosis not present

## 2020-04-21 DIAGNOSIS — M25531 Pain in right wrist: Secondary | ICD-10-CM | POA: Diagnosis not present

## 2020-05-11 ENCOUNTER — Ambulatory Visit: Payer: Medicare PPO | Admitting: Family Medicine

## 2020-05-20 DIAGNOSIS — F900 Attention-deficit hyperactivity disorder, predominantly inattentive type: Secondary | ICD-10-CM | POA: Diagnosis not present

## 2020-05-20 DIAGNOSIS — F411 Generalized anxiety disorder: Secondary | ICD-10-CM | POA: Diagnosis not present

## 2020-05-26 DIAGNOSIS — M25531 Pain in right wrist: Secondary | ICD-10-CM | POA: Diagnosis not present

## 2020-06-29 ENCOUNTER — Ambulatory Visit: Payer: Medicare PPO | Admitting: Family Medicine

## 2020-07-08 NOTE — Progress Notes (Signed)
Phone [phone] In person visit   Subjective:   Peter Novak is a 69 y.o. year old very pleasant male patient who presents for/with See problem oriented charting Chief Complaint  Patient presents with  . Hyperglycemia  . Hyperlipidemia   This visit occurred during the SARS-CoV-2 public health emergency.  Safety protocols were in place, including screening questions prior to the visit, additional usage of staff PPE, and extensive cleaning of exam room while observing appropriate contact time as indicated for disinfecting solutions.   Past Medical History-  Patient Active Problem List   Diagnosis Date Noted  . Recurrent episodes Dizziness/Ataxia 11/05/2019    Priority: High  . White matter abnormality on MRI of brain 04/16/2019    Priority: High  . Migraine with aura and with status migrainosus, not intractable 04/16/2019    Priority: High  . Ocular migraine 04/16/2019    Priority: High  . ADD (attention deficit disorder) 10/02/2014    Priority: Medium  . Insomnia 10/02/2014    Priority: Medium  . Osteoarthritis 10/02/2014    Priority: Medium  . Prostate cancer ([place]) 02/12/2013    Priority: Medium  . Hyperglycemia 11/30/2009    Priority: Medium  . History of melanoma 11/30/2009    Priority: Medium  . HYPERLIPIDEMIA 07/10/2007    Priority: Medium  . Bradycardia 11/05/2019    Priority: Low  . Migraine aura without headache (migraine equivalents) 04/16/2019    Priority: Low  . Erectile dysfunction 10/02/2014    Priority: Low  . Eustachian tube dysfunction 10/02/2014    Priority: Low  . GERD (gastroesophageal reflux disease) 02/12/2013    Priority: Low  . Benign neoplasm of adrenal gland 11/30/2009    Priority: Low    Medications- reviewed and updated Current Outpatient Medications  Medication Sig Dispense Refill  . ALPRAZolam (XANAX) 0.25 MG tablet Take 0.125 mg at bedtime as needed by mouth for anxiety.    . ASPIRIN 81 PO Take 81 mg by mouth daily.    [name] Kitchen  atorvastatin (LIPITOR) 20 MG tablet Take 1 tablet (20 mg total) by mouth daily. 90 tablet 3  . Chlorpheniramine Maleate (ALLERGY RELIEF PO) Take 0.5 tablets by mouth as needed.    [name] Kitchen esomeprazole (NEXIUM) 20 MG capsule Take 1 capsule (20 mg total) by mouth daily. 90 capsule 2  . IBUPROFEN PO Take by mouth as needed.    . meloxicam (MOBIC) 15 MG tablet Take 15 mg by mouth as needed.    . Multiple Vitamin (MULTIVITAMIN) tablet Take 1 tablet by mouth daily.    . Psyllium (METAMUCIL PO) Take by mouth.     No current facility-administered medications for this visit.     Objective:  BP 122/80   Pulse (!) 54   Temp 98.2 F (36.8 C) (Temporal)   Resp 18   Ht 6\' 3"  (1.905 m)   Wt 239 lb 6.4 oz (108.6 kg)   SpO2 97%   BMI 29.92 kg/m  Gen: NAD, resting comfortably CV: RRR no murmurs rubs or gallops Lungs: CTAB no crackles, wheeze, rhonchi Ext: no edema Skin: warm, dry    Assessment and Plan   #hyperlipidemia with advanced microvascular ischemia on MRI brain sept 2020.  S: Medication:atorvastatin 20 mg daily up from every other day. Also on Aspirin 81 mg Lab Results  Component Value Date   CHOL 170 11/01/2019   HDL 42.60 11/01/2019   LDLCALC 68 06/21/2018   LDLDIRECT 92.0 11/01/2019   TRIG 210.0 (H) 11/01/2019   CHOLHDL  4 11/01/2019   A/P: We are hoping for LDL under 70 with increase to atorvastatin daily.  With microvascular ischemia on MRI and with prefer LDL at this level.  Also due to this microvascular ischemia we opted to continue aspirin-I do think is reasonable if he would like to take this every other day instead of daily  #Recurrent dizziness/ataxia-extensive prior eval by ENT and neurology without obvious cause S: At baseline patient states not able to walk a straight line.  He also has ongoing issues where he gets Dizzy first thing in AM for a few hours last week and then got better.  Resting HR around 48-50 at these times. Frequency improved from last visit- once every 4-6  weeks.  - try gatorade  A/P: With recurrent dizzy episodes-thankfully have improved in frequency.  I do wonder if there could be orthostatic element/mild dehydration element-encouraged patient to try Gatorade on the morning for this occurs and perhaps lay back down for a few minutes and then get back up and see how he does. -I also wonder with bradycardia if that could contribute-discussed at next visit but perhaps could consider an apple watch to monitor heart rhythm during these episodes-would be difficult to wear a monitor since these may only occur every 4 to 6 weeks -In regards to bradycardia.TSh was normal in march.   #Osteoarthritis S: Patient stilll taking meloxicam as needed. If going to be active does meloxicam for a few days before and a few days after. voltaren gel some minimal help.  A/P: Reasonable control-continue orthopedic follow-up.  Recommend we monitor renal function with ongoing NSAID use  # Hyperglycemia/insulin resistance/prediabetes S:  Medication: Has preferred to avoid Metformin Exercise and diet-  Down 1 lb from last visit. 1.8-2 miles a day walking 5-6 days a week. GF Peter Novak cooks well but trying to eat healthy. Red meat once a month. Gets good amount of fruits/veggies.  Lab Results  Component Value Date   HGBA1C 5.9 11/01/2019   HGBA1C 6.0 06/21/2018   HGBA1C 6.0 08/14/2017    A/P: A1c slightly trending down last visit currently stable-we will update A1c with labs today.  Discussed working towards getting back down to 2 15-2 20 range he was then just a few years ago  #Prostate cancer under active surveillance with Dr. Zenia Resides -patient has not seen urology since last visit-he requests we do another PSA with labs today.  Fortunately stable last visit.  Would encourage urology follow-up Lab Results  Component Value Date   PSA 7.72 (H) 11/01/2019   PSA 7.92 03/22/2018   PSA 7.64 06/05/2017   Recommended follow up: Return in about 6 months (around 01/06/2021) for  physical or sooner if needed.  Lab/Order associations: protein shake and apple 2 slices   [id number]   1. Prostate cancer ([place])  C61 PSA    PSA  2. Hyperglycemia  R73.9 Hemoglobin A1c    Hemoglobin A1c  3. HYPERLIPIDEMIA  C16.6 COMPLETE METABOLIC PANEL WITH GFR    LDL cholesterol, direct    COMPLETE METABOLIC PANEL WITH GFR    LDL cholesterol, direct  4. Recurrent episodes Dizziness/Ataxia  R42   5. Primary osteoarthritis involving multiple joints  M89.49    Time Spent: 46 minutes of total time (4:35 PM- 5:21 PM) was spent on the date of the encounter performing the following actions: chart review prior to seeing the patient, obtaining history, performing a medically necessary exam, counseling on the treatment plan, placing orders, and documenting in our EHR.  Return precautions advised.  Garret Reddish, MD

## 2020-07-09 ENCOUNTER — Other Ambulatory Visit: Payer: Self-pay

## 2020-07-09 ENCOUNTER — Ambulatory Visit (INDEPENDENT_AMBULATORY_CARE_PROVIDER_SITE_OTHER): Payer: Medicare PPO | Admitting: Family Medicine

## 2020-07-09 ENCOUNTER — Encounter: Payer: Self-pay | Admitting: Family Medicine

## 2020-07-09 VITALS — BP 122/80 | HR 54 | Temp 98.2°F | Resp 18 | Ht 75.0 in | Wt 239.4 lb

## 2020-07-09 DIAGNOSIS — R42 Dizziness and giddiness: Secondary | ICD-10-CM

## 2020-07-09 DIAGNOSIS — R739 Hyperglycemia, unspecified: Secondary | ICD-10-CM | POA: Diagnosis not present

## 2020-07-09 DIAGNOSIS — E782 Mixed hyperlipidemia: Secondary | ICD-10-CM | POA: Diagnosis not present

## 2020-07-09 DIAGNOSIS — C61 Malignant neoplasm of prostate: Secondary | ICD-10-CM | POA: Diagnosis not present

## 2020-07-09 DIAGNOSIS — M8949 Other hypertrophic osteoarthropathy, multiple sites: Secondary | ICD-10-CM | POA: Diagnosis not present

## 2020-07-09 DIAGNOSIS — M159 Polyosteoarthritis, unspecified: Secondary | ICD-10-CM

## 2020-07-09 NOTE — Patient Instructions (Addendum)
Thanks for doing labs If you have mychart- we will send your results within 3 business days of [place] receiving them.  If you do not have mychart- we will call you about results within 5 business days of [place] receiving them.  *please note we are currently using [facility] which has a longer processing time than [place] typically so labs may not come back as quickly as in the past *please also note that you will see labs on mychart as soon as they post. I will later go in and write notes on them- will say "notes from Dr. Yong Channel"  No changes today  Recommended follow up: Return in about 6 months (around 01/06/2021) for physical or sooner if needed.

## 2020-07-10 LAB — COMPLETE METABOLIC PANEL WITH GFR
AG Ratio: 1.9 (calc) (ref 1.0–2.5)
ALT: 22 U/L (ref 9–46)
AST: 19 U/L (ref 10–35)
Albumin: 4.4 g/dL (ref 3.6–5.1)
Alkaline phosphatase (APISO): 65 U/L (ref 35–144)
BUN/Creatinine Ratio: 26 (calc) — ABNORMAL HIGH (ref 6–22)
BUN: 29 mg/dL — ABNORMAL HIGH (ref 7–25)
CO2: 27 mmol/L (ref 20–32)
Calcium: 9.5 mg/dL (ref 8.6–10.3)
Chloride: 105 mmol/L (ref 98–110)
Creat: 1.12 mg/dL (ref 0.70–1.18)
GFR, Est African American: 77 mL/min/{1.73_m2} (ref >=60)
GFR, Est Non African American: 66 mL/min/{1.73_m2} (ref >=60)
Globulin: 2.3 g/dL (calc) (ref 1.9–3.7)
Glucose, Bld: 98 mg/dL (ref 65–99)
Potassium: 4.6 mmol/L (ref 3.5–5.3)
Sodium: 139 mmol/L (ref 135–146)
Total Bilirubin: 0.7 mg/dL (ref 0.2–1.2)
Total Protein: 6.7 g/dL (ref 6.1–8.1)

## 2020-07-10 LAB — LDL CHOLESTEROL, DIRECT: Direct LDL: 73 mg/dL (ref <100)

## 2020-07-10 LAB — PSA: PSA: 9.1 ng/mL — ABNORMAL HIGH (ref <=4.0)

## 2020-07-10 LAB — HEMOGLOBIN A1C
Hgb A1c MFr Bld: 5.7 % of total Hgb — ABNORMAL HIGH (ref <5.7)
Mean Plasma Glucose: 117 (calc)
eAG (mmol/L): 6.5 (calc)

## 2020-10-27 DIAGNOSIS — G43109 Migraine with aura, not intractable, without status migrainosus: Secondary | ICD-10-CM | POA: Diagnosis not present

## 2020-10-27 DIAGNOSIS — H52223 Regular astigmatism, bilateral: Secondary | ICD-10-CM | POA: Diagnosis not present

## 2020-10-27 DIAGNOSIS — R519 Headache, unspecified: Secondary | ICD-10-CM | POA: Diagnosis not present

## 2020-10-27 DIAGNOSIS — H5203 Hypermetropia, bilateral: Secondary | ICD-10-CM | POA: Diagnosis not present

## 2020-10-27 DIAGNOSIS — H524 Presbyopia: Secondary | ICD-10-CM | POA: Diagnosis not present

## 2020-10-28 ENCOUNTER — Other Ambulatory Visit: Payer: Self-pay | Admitting: Family Medicine

## 2020-11-02 DIAGNOSIS — H25811 Combined forms of age-related cataract, right eye: Secondary | ICD-10-CM | POA: Diagnosis not present

## 2020-11-02 DIAGNOSIS — Z01818 Encounter for other preprocedural examination: Secondary | ICD-10-CM | POA: Diagnosis not present

## 2020-11-19 DIAGNOSIS — H25811 Combined forms of age-related cataract, right eye: Secondary | ICD-10-CM | POA: Diagnosis not present

## 2020-11-19 DIAGNOSIS — H2511 Age-related nuclear cataract, right eye: Secondary | ICD-10-CM | POA: Diagnosis not present

## 2020-11-23 ENCOUNTER — Telehealth: Payer: Self-pay | Admitting: Family Medicine

## 2020-11-23 NOTE — Telephone Encounter (Signed)
Left message for patient to call back and schedule Medicare Annual Wellness Visit (AWV) either virtually OR in office.   Last AWV 06/07/18; please schedule at anytime with [facility] Health Advisor at [facility].  This should be a 45 minute visit.

## 2020-12-03 DIAGNOSIS — H25812 Combined forms of age-related cataract, left eye: Secondary | ICD-10-CM | POA: Diagnosis not present

## 2020-12-03 DIAGNOSIS — H2512 Age-related nuclear cataract, left eye: Secondary | ICD-10-CM | POA: Diagnosis not present

## 2021-01-13 ENCOUNTER — Ambulatory Visit (INDEPENDENT_AMBULATORY_CARE_PROVIDER_SITE_OTHER): Payer: Medicare PPO | Admitting: Neurology

## 2021-01-13 ENCOUNTER — Encounter: Payer: Self-pay | Admitting: Neurology

## 2021-01-13 VITALS — BP 173/92 | HR 55 | Ht 74.5 in | Wt 244.0 lb

## 2021-01-13 DIAGNOSIS — R9082 White matter disease, unspecified: Secondary | ICD-10-CM

## 2021-01-13 DIAGNOSIS — I6789 Other cerebrovascular disease: Secondary | ICD-10-CM

## 2021-01-13 DIAGNOSIS — E538 Deficiency of other specified B group vitamins: Secondary | ICD-10-CM

## 2021-01-13 NOTE — Progress Notes (Signed)
[facility]  Provider: Dr Jaynee Eagles Referring Provider: Candise Che, NP Dr. Yong Channel Primary Care Physician: Garret Reddish, MD  CC: Abnormal white matter in the brain chronic microvascular changes  Interval history Jan 13, 2021: Patient with microvascular ischemia on MRI, working with his PCP with his hyperlipidemia trying to get it less than 70 LDL, also on aspirin 81 mg.  Recently increased his atorvastatin.  Still having ongoing dizziness and he has been extensively evaluated by ENT and neurology, possibly an orthostatic element and they discussed hydration, I also discussed bradycardia that could contribute, but episodes only occur every 4 to 6 weeks, TSH was normal in March, he still takes meloxicam for osteoarthritis, and he is being treated for hyperglycemia/prediabetes with slightly trending down A1c.  Last A1c was 5.7, 6 months ago, 3 years ago it was 6.2. His memory Is fine. The end of feb/march he had ocular migraine with the swimmy vision, aura 3 days in a row. He also had a headache after the aura. Tylenol helped. Completely reversed. He snores but he is not excessively tired, not snoring all the time, only sometimes, really does not think he has OSA, but discussed.   Patient complains of symptoms per HPI as well as the following symptoms: ocular migraines . Pertinent negatives and positives per HPI. All others negative   Interval history 04/16/2019: 70 y.o. male here as a new request from Dr. Yong Channel for ataxia, dizziness, changes of visual perception, abnormal white matter changes of brain. 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 Thornell Mule ENT., Past medical history of K-Helix piston ossiculoplasty, malleus ro neomembrane, right tympanoplasty, incus implant, HTN, HLD, Pre-diabetes.  He has advanced for age white matter changes in the brain.  He has migraines, prisms in his vision, "pixilations"in his vision,  squirly that occurred around July in the setting of bike riding, he has had this in the past as well consistent with his prior episodes, he also has difficulty talking, as far back as 2007, always the same and followed by a headache. He had this multiple times in July, usually in the right eye but occurred in the left eye. The one in the left eye he had a headache, not excruciating, he has not had one since then, has difficulty with speech, 10 minutes usually. In the mornings when he stands up he feel dizzy briefly until he starts walking around. Likely orthostatic, discussed conservative measures. He is photosensitive, dizzy which is common in migraineurs. He used to see Dr. Thornell Mule for his hearing he has an implant in the right ear helix coil. We will recommend another opinion for his white matter changes. He denies any cognitive issues, he is active, looks much younger than stated age.   I reviewed notes from his inpatient stay November 2018, his girlfriend found him at the bedside, he had word finding difficulty lasting for 10 minutes and then started to have a headache which is similar to his previous headache with visual, aphasia or 8 to 9 years ago.  He had 2 episodes of migraine with aura one with visual normal speech.  Repeat imaging was stable.  EEG was normal.  Neurologic exam was normal.  He was diagnosed with complicated migraine less likely TIA a is all work-up negative per vascular neurologist Dr. Erlinda Hong.  He was asked to take aspirin 81 mg daily, prior to admission he was only taking it twice a week.  He  was also discharged on Lipitor 20 mg daily (prior was only taking it twice a week).  Also advised ongoing aggressive stroke risk factor management.  Also addressed advanced white matter disease with a frontal predominance where is developing confluent signal abnormality, nonspecific pattern, likely from chronic microvascular disease at this age and uncontrolled risk factors.  Echocardiogram was  unremarkable for features concerning for stroke risks, he did have mild pulmonary hypertension,  abnormal left ventricular relaxation grade 1 diastolic dysfunction, I do not see TEE.   I reviewed MRI of the brain images completed November 2018 which showed no significant changes.  I also reviewed CTA of the head and neck reports which showed no acute intracranial process or significant stenosis.  Interval history 02/10/2016: 70 y.o. male here as a referral from Dr. Yong Channel for ataxia, dizziness, changes of visual perception. 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 .  He has advanced for age white matter changes in the brain. HgbA1c 6.0, LDL was elevated at 137. Today he brings in labs values since 1994 which showed he has had hyperlipidemia since then. He also had a 10 year. Of significant stress.. He has taken Adderall for many years in the past which can elevate blood pressure but trends in the clinic recently and at home have been normal. CTA of the head and neck showed no significant extracranial or intracranial stenosis of significance. Reviewed CTA of the head and neck which showed no significant atheroscleoris. He comes in with girlfriend today, reviewed MRi images and discussed findings with wife who was not here at last appointment.   CTA of the head and neck 01/18/2016: No extracranial or intracranial stenosis or occlusion of significance.  No abnormal postcontrast enhancement.  Hypoattenuation of white matter, better visualized on MR, consistent with advanced chronic microvascular ischemic change.   HPI: SINCERE LIUZZI is a 70 y.o. male here as a referral from Dr. Yong Channel for ataxia, dizziness, changes of visual perception. 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 . He denies HTN, Diabetes, HLD, smoking, alcohol use or other vascular risk factors. He had some dizziness earlier this year in March, within 15 minutes of waking up he would have some leaning to one side, spinning feeling, he would have some imbalance on walking which would resolve. It happened for several days otherwise nothing else happened. Had several episodes. The end of April he had it for 3 days continuously, dizziness, lightheaded, no room spinning, no CP, no SOB, no weakness, no other focal neurologic deficits. No episodes since April. No FHx of autoimmune disorders, parents died in their 28s, he is generally healthy. No inciting events, no head trauma or previous illnesses. No travel overseas. Nothing made the symptoms worse or better. No history of neurologic symptoms. No family history of autoimmune disorders, neurodegenerative, diseases. No other focal neurologic deficits, no dysarthria, no dysphagia, no aphasia, no vision changes, no weakness or sensory changes. Currently he denies no gait abnormalities. He has had worsening left hearing changes with fullness in the ear. Fullness in the ear feels better with Valsalva. Denies headaches, no family history of personal history of migraines.  Reviewed notes, labs and imaging from outside physicians, which showed: Reviewed notes by Dr. Hermina Barters. Seen in the office complaining of  dizziness as a rotation torque sensation. Symptoms began 3-4 months ago spells in early March. Spells occurred in the morning or in the afternoon. Wobbly. No nausea or vomiting. He does describe decreased hearing in the left ear with a feeling of constant fullness. Feels better with Valsalva. He has a water-like feeling in both ears but no fluid draining out of either ear. Denied headaches, migraine disease. Exam reviewed patient's right tympanic membrane is stable, no middle ear effusion, no sign of  infection, implant appears in place, asymmetric high frequency sensorineural hearing loss, right ear greater than left. 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  MRI of the brain 12/16/2015 (personally reviewed images and agree with the following) Normal and symmetric labyrinthine signal. No thickening or enhancement along the vestibular cochlear nerves to suggest schwannoma. No enlarged endolymphatic sac. No specific explanation for ataxia - the cerebellum and brainstem has normal size and signal. Normal appearance of the vertebral and basilar arteries.  Extensive for age white matter disease in the cerebral hemispheres with a frontal predominance where there is developing confluent signal abnormality. The pattern is nonspecific but usually from chronic microvascular disease at this age. There is no specific demyelinating pattern.  IMPRESSION: 1. No explanation for the provided history. 2. Moderate white matter disease, nonspecific pattern but usually from chronic microvascular ischemia.   Review of Systems: Patient complains of symptoms per HPI as well as the following symptoms: ringing in ears, spinning sensation, dizziness . Pertinent negatives per HPI. All others negative.   Social History   Socioeconomic History  . Marital status: Married    Spouse name: Not on file  . Number of children: Not on file  . Years of education: Not on file  . Highest education level: Not on file  Occupational History  . Not on file  Tobacco Use  . Smoking status: Never Smoker  . Smokeless tobacco: Never Used  Vaping Use  . Vaping Use: Never used  Substance and Sexual Activity  . Alcohol use: Yes    Alcohol/week: 1.0 - 2.0 standard drink    Types: 1 - 2 Standard drinks or equivalent per week    Comment: social only   . Drug use: No  . Sexual activity: Not on file  Other Topics Concern  . Not on file  Social History Narrative   Mosie Epstein 2021. Married  2006, divorced 2010. 2 children (35 and 99 in 2016 in good health), no grandkids.    [employer] grad.       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       Hobbies: place in [place] in [place], outdoor activities, reading, follow things on PC      Right handed      Lives at home with wife and dog       Caffeine: 2 cups/day   Social Determinants of Health   Financial Resource Strain: Not on file  Food Insecurity: Not on file  Transportation Needs: Not on file  Physical Activity: Not on file  Stress: Not on file  Social Connections: Not on file  Intimate Partner Violence: Not on file    Family History  Problem Relation Age of Onset  . Diverticulosis Mother   . Diabetes Father        AAA  . Sudden death Brother 61       CAD; MI @ 33, smoker, sedentary, overweight, diabetes, HLD  .  Diabetes Brother   . Hyperlipidemia Brother   . Colon cancer Maternal Aunt 82  . Heart attack Paternal Grandfather 72  . Stroke Neg Hx   . Neuropathy Neg Hx   . Multiple sclerosis Neg Hx   . Migraines Neg Hx   . Dementia Neg Hx     Past Medical History:  Diagnosis Date  . ADD (attention deficit disorder with hyperactivity)   . Constipation   . Hearing loss   . History of skin cancer    Melanoma X 2  . Hyperlipidemia     Past Surgical History:  Procedure Laterality Date  . CATARACT EXTRACTION, BILATERAL  March 2022 and April 2022  . CHOLECYSTECTOMY  2005  . COLONOSCOPY  2008   negative  . ELBOW SURGERY  1994  . HERNIA REPAIR  2005  . INNER EAR SURGERY  2006   Incus transposition;oscillculoplasty; Dr Thornell Mule  . INNER EAR SURGERY  2011   Stirrup resection  . SHOULDER ARTHROSCOPY  2004   Dr Mayer Camel  . TONSILLECTOMY AND ADENOIDECTOMY    . VASECTOMY      Current Outpatient Medications  Medication Sig Dispense Refill  . ALPRAZolam (XANAX) 0.25 MG tablet Take 0.125 mg at bedtime as needed by mouth for anxiety.    . Ascorbic Acid  (VITAMIN C PO) Take by mouth.    . ASPIRIN 81 PO Take 81 mg by mouth daily.    [name] Kitchen atorvastatin (LIPITOR) 20 MG tablet TAKE ONE TABLET BY MOUTH DAILY 90 tablet 3  . BIOTIN PO Take by mouth.    [name] Kitchen CALCIUM CITRATE PO Take by mouth.    . Chlorpheniramine Maleate (ALLERGY RELIEF PO) Take 0.5 tablets by mouth as needed.    . Coenzyme Q10 (CO Q 10 PO) Take by mouth.    . esomeprazole (NEXIUM) 20 MG capsule Take 1 capsule (20 mg total) by mouth daily. 90 capsule 2  . IBUPROFEN PO Take by mouth as needed.    [name] Kitchen MAGNESIUM CITRATE PO Take by mouth.    . meloxicam (MOBIC) 15 MG tablet Take 15 mg by mouth as needed.    . Multiple Vitamin (MULTIVITAMIN) tablet Take 1 tablet by mouth daily.    . Multiple Vitamins-Minerals (ZINC PO) Take by mouth.    . Psyllium (METAMUCIL PO) Take by mouth.    . Pyridoxine HCl (VITAMIN B-6 PO) Take by mouth.     No current facility-administered medications for this visit.    Allergies as of 01/13/2021  . (No Known Allergies)    Vitals: BP (!) 173/92 (BP Location: Right Arm, Patient Position: Sitting)   Pulse (!) 55   Ht 6' 2.5" (1.892 m)   Wt 244 lb (110.7 kg)   BMI 30.91 kg/m  Last Weight:  Wt Readings from Last 1 Encounters:  01/13/21 244 lb (110.7 kg)   Last Height:   Ht Readings from Last 1 Encounters:  01/13/21 6' 2.5" (1.892 m)   Exam: NAD, pleasant                  Speech:    Speech is normal; fluent and spontaneous with normal comprehension.  Cognition:    The patient is oriented to person, place, and time;     recent and remote memory intact;     language fluent;    Cranial Nerves:    The pupils are equal, round, and reactive to light.Trigeminal sensation is intact and the muscles of mastication are normal. The face is symmetric.  The palate elevates in the midline. Hearing intact. Voice is normal. Shoulder shrug is normal. The tongue has normal motion without fasciculations.   Coordination:  No dysmetria  Motor Observation:    No asymmetry,  no atrophy, and no involuntary movements noted. Tone:    Normal muscle tone.     Strength:    Strength is V/V in the upper and lower limbs.      Sensation: intact to LT     Assessment/Plan: 70 y.o. 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  - MRI of the brain 2018 and 2020 showed stable advanced-for-age white matter disease in the cerebral hemispheres with a frontal and left hemispheric predominance where there is developing confluent signal abnormality. The pattern is nonspecific but usually from chronic microvascular disease at this age. He is managing his HLD,obesity,  pre-diabetes. High blood pressure over certain number of years possibly in the setting of Adderall may have contributed as well.  - Denies any symptoms of OSA or cognitive decline.  - CTA of the head and neck 2017 showed No extracranial or intracranial stenosis or occlusion of significance. Repeat 2018 inpatient also did not show large vessel disease   -  Continue daily ASA 81 mg for stroke prevention.  - cont managing risk factors  I had a long d/w patient about increased risk for stroke/TIAs, personally independently reviewed imaging studies and stroke evaluation results and answered questions.Continue ASA daily for stroke prevention and maintain strict control of hypertension with blood pressure goal below 130/90, diabetes with hemoglobin A1c goal below 6.5% and lipids with LDL cholesterol goal below 70 mg/dL.I also advised the patient to eat a healthy diet with plenty of whole grains, cereals, fruits and vegetables, exercise regularly and  maintain ideal body weight .  Orders Placed This Encounter  Procedures  . B12 and Folate Panel  . Methylmalonic acid, serum    Followup in the future with me as needed  Cc: Candise Che, NP Dr. Milus Banister, MD  [facility] [address] [place], [place] [address]  Phone [phone] Fax [phone]  I spent 30  minutes of face-to-face and non-face-to-face time with patient on the  1. Cerebral microvascular disease   2. White matter abnormality on MRI of brain   3. B12 deficiency    diagnosis.  This included previsit chart review, lab review, study review, order entry, electronic health record documentation, patient education on the different diagnostic and therapeutic options, counseling and coordination of care, risks and benefits of management, compliance, or risk factor reduction

## 2021-01-19 LAB — B12 AND FOLATE PANEL
Folate: 10.8 ng/mL (ref >3.0)
Vitamin B-12: 349 pg/mL (ref 232–1245)

## 2021-01-19 LAB — METHYLMALONIC ACID, SERUM: Methylmalonic Acid: 203 nmol/L (ref 0–378)

## 2021-01-22 DIAGNOSIS — H02831 Dermatochalasis of right upper eyelid: Secondary | ICD-10-CM | POA: Diagnosis not present

## 2021-01-22 DIAGNOSIS — H02834 Dermatochalasis of left upper eyelid: Secondary | ICD-10-CM | POA: Diagnosis not present

## 2021-01-26 DIAGNOSIS — M7130 Other bursal cyst, unspecified site: Secondary | ICD-10-CM | POA: Diagnosis not present

## 2021-01-26 DIAGNOSIS — L821 Other seborrheic keratosis: Secondary | ICD-10-CM | POA: Diagnosis not present

## 2021-01-26 DIAGNOSIS — Z85828 Personal history of other malignant neoplasm of skin: Secondary | ICD-10-CM | POA: Diagnosis not present

## 2021-01-26 DIAGNOSIS — Z85820 Personal history of malignant melanoma of skin: Secondary | ICD-10-CM | POA: Diagnosis not present

## 2021-01-26 DIAGNOSIS — L812 Freckles: Secondary | ICD-10-CM | POA: Diagnosis not present

## 2021-01-26 DIAGNOSIS — L820 Inflamed seborrheic keratosis: Secondary | ICD-10-CM | POA: Diagnosis not present

## 2021-01-27 DIAGNOSIS — A6920 Lyme disease, unspecified: Secondary | ICD-10-CM

## 2021-01-27 HISTORY — DX: Lyme disease, unspecified: A69.20

## 2021-01-28 ENCOUNTER — Ambulatory Visit: Payer: Medicare Other | Admitting: Neurology

## 2021-02-10 DIAGNOSIS — F411 Generalized anxiety disorder: Secondary | ICD-10-CM | POA: Diagnosis not present

## 2021-02-10 DIAGNOSIS — F900 Attention-deficit hyperactivity disorder, predominantly inattentive type: Secondary | ICD-10-CM | POA: Diagnosis not present

## 2021-02-11 DIAGNOSIS — H02834 Dermatochalasis of left upper eyelid: Secondary | ICD-10-CM | POA: Diagnosis not present

## 2021-02-11 DIAGNOSIS — H02831 Dermatochalasis of right upper eyelid: Secondary | ICD-10-CM | POA: Diagnosis not present

## 2021-03-18 DIAGNOSIS — B09 Unspecified viral infection characterized by skin and mucous membrane lesions: Secondary | ICD-10-CM | POA: Diagnosis not present

## 2021-03-18 DIAGNOSIS — L821 Other seborrheic keratosis: Secondary | ICD-10-CM | POA: Diagnosis not present

## 2021-03-18 DIAGNOSIS — Z85828 Personal history of other malignant neoplasm of skin: Secondary | ICD-10-CM | POA: Diagnosis not present

## 2021-03-18 DIAGNOSIS — Z85820 Personal history of malignant melanoma of skin: Secondary | ICD-10-CM | POA: Diagnosis not present

## 2021-03-23 ENCOUNTER — Encounter: Payer: Self-pay | Admitting: Family Medicine

## 2021-03-23 ENCOUNTER — Ambulatory Visit: Payer: Medicare PPO | Admitting: Family Medicine

## 2021-03-23 ENCOUNTER — Other Ambulatory Visit: Payer: Self-pay

## 2021-03-23 VITALS — BP 138/84 | HR 57 | Temp 98.3°F | Ht 74.5 in | Wt 237.2 lb

## 2021-03-23 DIAGNOSIS — R5383 Other fatigue: Secondary | ICD-10-CM | POA: Diagnosis not present

## 2021-03-23 DIAGNOSIS — R21 Rash and other nonspecific skin eruption: Secondary | ICD-10-CM

## 2021-03-23 DIAGNOSIS — C61 Malignant neoplasm of prostate: Secondary | ICD-10-CM | POA: Diagnosis not present

## 2021-03-23 LAB — COMPREHENSIVE METABOLIC PANEL
ALT: 37 U/L (ref 0–53)
AST: 22 U/L (ref 0–37)
Albumin: 4.2 g/dL (ref 3.5–5.2)
Alkaline Phosphatase: 122 U/L — ABNORMAL HIGH (ref 39–117)
BUN: 22 mg/dL (ref 6–23)
CO2: 26 mEq/L (ref 19–32)
Calcium: 9.3 mg/dL (ref 8.4–10.5)
Chloride: 104 mEq/L (ref 96–112)
Creatinine, Ser: 1.01 mg/dL (ref 0.40–1.50)
GFR: 74.81 mL/min (ref >60.00)
Glucose, Bld: 99 mg/dL (ref 70–99)
Potassium: 4.5 mEq/L (ref 3.5–5.1)
Sodium: 138 mEq/L (ref 135–145)
Total Bilirubin: 0.6 mg/dL (ref 0.2–1.2)
Total Protein: 6.9 g/dL (ref 6.0–8.3)

## 2021-03-23 LAB — CBC WITH DIFFERENTIAL/PLATELET
Basophils Absolute: 0.1 10*3/uL (ref 0.0–0.1)
Basophils Relative: 0.9 % (ref 0.0–3.0)
Eosinophils Absolute: 0.1 10*3/uL (ref 0.0–0.7)
Eosinophils Relative: 2.4 % (ref 0.0–5.0)
HCT: 38.7 % — ABNORMAL LOW (ref 39.0–52.0)
Hemoglobin: 12.8 g/dL — ABNORMAL LOW (ref 13.0–17.0)
Lymphocytes Relative: 26.9 % (ref 12.0–46.0)
Lymphs Abs: 1.4 10*3/uL (ref 0.7–4.0)
MCHC: 33.1 g/dL (ref 30.0–36.0)
MCV: 87.6 fl (ref 78.0–100.0)
Monocytes Absolute: 0.6 10*3/uL (ref 0.1–1.0)
Monocytes Relative: 11 % (ref 3.0–12.0)
Neutro Abs: 3.1 10*3/uL (ref 1.4–7.7)
Neutrophils Relative %: 58.8 % (ref 43.0–77.0)
Platelets: 212 10*3/uL (ref 150.0–400.0)
RBC: 4.41 Mil/uL (ref 4.22–5.81)
RDW: 13.5 % (ref 11.5–15.5)
WBC: 5.3 10*3/uL (ref 4.0–10.5)

## 2021-03-23 LAB — SEDIMENTATION RATE: Sed Rate: 36 mm/hr — ABNORMAL HIGH (ref 0–20)

## 2021-03-23 LAB — C-REACTIVE PROTEIN: CRP: <1 mg/dL (ref 0.5–20.0)

## 2021-03-23 LAB — PSA: PSA: 10.56 ng/mL — ABNORMAL HIGH (ref 0.10–4.00)

## 2021-03-23 LAB — TSH: TSH: 1.77 u[IU]/mL (ref 0.35–5.50)

## 2021-03-23 NOTE — Patient Instructions (Signed)
It was very nice to see you today!  We will check blood work today to look for Lyme disease or any other possible causes of your rash.  It is possible this could be due to a virus that has passed.  We will also check your PSA today.  Take care, Dr Jerline Pain  PLEASE NOTE:  If you had any lab tests please let us know if you have not heard back within a few days. You may see your results on mychart before we have a chance to review them but we will give you a call once they are reviewed by [place]. If we ordered any referrals today, please let us know if you have not heard from their office within the next week.   Please try these tips to maintain a healthy lifestyle:  Eat at least 3 REAL meals and 1-2 snacks per day.  Aim for no more than 5 hours between eating.  If you eat breakfast, please do so within one hour of getting up.   Each meal should contain half fruits/vegetables, one quarter protein, and one quarter carbs (no bigger than a computer mouse)  Cut down on sweet beverages. This includes juice, soda, and sweet tea.   Drink at least 1 glass of water with each meal and aim for at least 8 glasses per day  Exercise at least 150 minutes every week.

## 2021-03-23 NOTE — Progress Notes (Signed)
   Peter Novak is a 70 y.o. male who presents today for an office visit.  Assessment/Plan:  New/Acute Problems: Rash/fatigue Unclear etiology.  Broad differential.  Lyme disease is a consideration.  Also possibly could be a viral exanthem though it is a bit atypical for her to persist this long.  We will check labs including CBC with differential, c-Met, TSH, ANA, CRP, sed rate, and Lyme disease titers.  Hopefully his symptoms will continue to improve as they have done over the last few days.  He will let us know if symptoms change.   Chronic Problems Addressed Today: Prostate cancer Unlikely to be associated with his above symptoms but we will check PSA today.  This was last checked over 6 months ago.  He follows with urology.  He does not want another biopsy at this point.    Subjective:  HPI:  Patient here with several weeks of excessive fatigue and rash.  Symptoms started about 4 weeks ago with fatigue.  This was a couple weeks after having a blepharoplasty.  Shortly after developing fever he had a short period with intense chills and shaking.  Shortly after this developed erythematous rash on extremities as well as facial swelling.  Fatigue persisted throughout all of this.  Took on COVID test which was negative.  Did not have any other URI symptoms and had no sick contacts during this time.  He has had migrating erythematous patches approximately 8 to 10 cm in diameter across all extremities and trunk as well as face of the last few weeks.  As of the last few days the lesions seem to be resolving though still has persistent fatigue.  No new medications, soaps, detergents, or any other exposures.  No known tick bites though has been outdoors more often the last several weeks.       Objective:  Physical Exam: BP 138/84   Pulse (!) 57   Temp 98.3 F (36.8 C) (Temporal)   Ht 6' 2.5" (1.892 m)   Wt 237 lb 3.2 oz (107.6 kg)   SpO2 97%   BMI 30.05 kg/m   Gen: No acute distress,  resting comfortably CV: Regular rate and rhythm with no murmurs appreciated Pulm: Normal work of breathing, clear to auscultation bilaterally with no crackles, wheezes, or rhonchi SKin: Faintly erythematous erythematous patches approximately 10 cm in diameter scattered across upper extremities and trunk. Neuro: Grossly normal, moves all extremities Psych: Normal affect and thought content  Time Spent: 35 minutes of total time was spent on the date of the encounter performing the following actions: chart review prior to seeing the patient, obtaining history, performing a medically necessary exam including reviewing pictures on patient's phone, counseling on the treatment plan, placing orders, and documenting in our EHR.        Algis Greenhouse. Jerline Pain, MD 03/23/2021 11:31 AM

## 2021-03-24 ENCOUNTER — Encounter: Payer: Self-pay | Admitting: Family Medicine

## 2021-03-25 ENCOUNTER — Other Ambulatory Visit: Payer: Self-pay | Admitting: *Deleted

## 2021-03-25 MED ORDER — DOXYCYCLINE HYCLATE 100 MG PO TABS: 100.0000 mg | ORAL_TABLET | Freq: Two times a day (BID) | ORAL | 0 refills | Status: DC

## 2021-03-25 NOTE — Telephone Encounter (Signed)
Peter Novak has spoken with pt and sent medication to pharmacy.

## 2021-03-25 NOTE — Progress Notes (Signed)
Please inform patient of the following:  We are still waiting on 1 test to come back however his Lyme disease test was positive.  This needs to be treated with doxycycline 100 mg twice daily for 21 days. Please send in for patient. Would like for him to let us know if his symptoms are not improving and we will refer to ID.  Algis Greenhouse. Jerline Pain, MD 03/25/2021 10:07 AM

## 2021-03-26 LAB — ANTI-NUCLEAR AB-TITER (ANA TITER): ANA Titer 1: 1:80 {titer} — ABNORMAL HIGH

## 2021-03-26 LAB — B. BURGDORFI ANTIBODIES BY WB
B burgdorferi IgG Abs (IB): NEGATIVE
B burgdorferi IgM Abs (IB): POSITIVE — AB
Lyme Disease 18 kD IgG: NONREACTIVE
Lyme Disease 23 kD IgG: REACTIVE — AB
Lyme Disease 23 kD IgM: REACTIVE — AB
Lyme Disease 28 kD IgG: NONREACTIVE
Lyme Disease 30 kD IgG: NONREACTIVE
Lyme Disease 39 kD IgG: NONREACTIVE
Lyme Disease 39 kD IgM: NONREACTIVE
Lyme Disease 41 kD IgG: REACTIVE — AB
Lyme Disease 41 kD IgM: REACTIVE — AB
Lyme Disease 45 kD IgG: NONREACTIVE
Lyme Disease 58 kD IgG: NONREACTIVE
Lyme Disease 66 kD IgG: NONREACTIVE
Lyme Disease 93 kD IgG: NONREACTIVE

## 2021-03-26 LAB — LYME AB SCREEN %: Lyme AB Screen: 9.12 index — ABNORMAL HIGH

## 2021-03-26 LAB — ANA: Anti Nuclear Antibody (ANA): POSITIVE — AB

## 2021-03-26 NOTE — Progress Notes (Signed)
Please inform patient of the following:  One of his autoimmune markers was very slightly positive.  I do not think this is causing his any issues and it is probably due to Lyme disease.  Do not need to do any further work-up at this point however if the symptoms do not improve we can do further testing.  Would like for him to let us know if his symptoms do not improve.

## 2021-06-08 DIAGNOSIS — C61 Malignant neoplasm of prostate: Secondary | ICD-10-CM | POA: Diagnosis not present

## 2021-06-08 DIAGNOSIS — N5201 Erectile dysfunction due to arterial insufficiency: Secondary | ICD-10-CM | POA: Diagnosis not present

## 2021-06-11 ENCOUNTER — Encounter: Payer: Self-pay | Admitting: Family Medicine

## 2021-06-11 ENCOUNTER — Other Ambulatory Visit: Payer: Self-pay | Admitting: Urology

## 2021-06-11 DIAGNOSIS — C61 Malignant neoplasm of prostate: Secondary | ICD-10-CM

## 2021-06-17 ENCOUNTER — Ambulatory Visit (INDEPENDENT_AMBULATORY_CARE_PROVIDER_SITE_OTHER): Payer: Medicare PPO | Admitting: Family Medicine

## 2021-06-17 ENCOUNTER — Encounter: Payer: Self-pay | Admitting: Family Medicine

## 2021-06-17 ENCOUNTER — Other Ambulatory Visit: Payer: Self-pay

## 2021-06-17 VITALS — BP 158/84 | HR 58 | Temp 98.0°F | Ht 74.0 in | Wt 241.6 lb

## 2021-06-17 DIAGNOSIS — A6920 Lyme disease, unspecified: Secondary | ICD-10-CM | POA: Diagnosis not present

## 2021-06-17 DIAGNOSIS — Z0000 Encounter for general adult medical examination without abnormal findings: Secondary | ICD-10-CM | POA: Diagnosis not present

## 2021-06-17 DIAGNOSIS — M159 Polyosteoarthritis, unspecified: Secondary | ICD-10-CM

## 2021-06-17 DIAGNOSIS — R42 Dizziness and giddiness: Secondary | ICD-10-CM | POA: Diagnosis not present

## 2021-06-17 DIAGNOSIS — R739 Hyperglycemia, unspecified: Secondary | ICD-10-CM | POA: Diagnosis not present

## 2021-06-17 DIAGNOSIS — E782 Mixed hyperlipidemia: Secondary | ICD-10-CM

## 2021-06-17 DIAGNOSIS — Z23 Encounter for immunization: Secondary | ICD-10-CM | POA: Diagnosis not present

## 2021-06-17 NOTE — Patient Instructions (Addendum)
Health Maintenance Due  Topic Date Due   COVID-19 Vaccine (3 - Pfizer risk series) - Recommend getting Omicron/Bivalent booster only at your local pharmacy! Please let us know when you have received this vaccination. -declines for now  01/06/2020   INFLUENZA VACCINE  - High dose flu shot today before you leave.  03/29/2021   Schedule a lab visit at the check out desk for tomorrow morning . Return for future fasting labs meaning nothing but water after midnight please. Ok to take your medications with water.   Recommend that you try to push at least 1-2 more bottles of fluids in.  If Metamucil is not effective for you, could try half cap of Miralax in addition to what you are doing.  To get your 215 lb goal, you could try calorie counting with the MyFitnessPal.  Start Cyanocobalamin/Vitamin B-12 1000 mcg once a week.  Advise for you to schedule a follow-up with ENT.  Please monitor your blood pressures at home for one week and update with me through MyChart - goal at home is to be 135/85 on average.  Recommended follow up: Return in about 1 year (around 06/17/2022) for a physical or sooner if needed.

## 2021-06-17 NOTE — Progress Notes (Signed)
Phone: [phone]   Subjective:  Patient presents today for their annual physical. Chief complaint-noted.   See problem oriented charting- ROS- full  review of systems was completed and negative  except for:  constipation- metamucil twice daily, knee pain  The following were reviewed and entered/updated in epic: Past Medical History:  Diagnosis Date   ADD (attention deficit disorder with hyperactivity)    Constipation    Hearing loss    History of skin cancer    Melanoma X 2   Hyperlipidemia    Patient Active Problem List   Diagnosis Date Noted   Recurrent episodes Dizziness/Ataxia 11/05/2019    Priority: 1.   White matter abnormality on MRI of brain 04/16/2019    Priority: 1.   Migraine with aura and with status migrainosus, not intractable 04/16/2019    Priority: 1.   Ocular migraine 04/16/2019    Priority: 1.   ADD (attention deficit disorder) 10/02/2014    Priority: 2.   Insomnia 10/02/2014    Priority: 2.   Osteoarthritis 10/02/2014    Priority: 2.   Prostate cancer ([place]) 02/12/2013    Priority: 2.   Hyperglycemia 11/30/2009    Priority: 2.   History of melanoma 11/30/2009    Priority: 2.   HYPERLIPIDEMIA 07/10/2007    Priority: 2.   Bradycardia 11/05/2019    Priority: 3.   Migraine aura without headache (migraine equivalents) 04/16/2019    Priority: 3.   Erectile dysfunction 10/02/2014    Priority: 3.   Eustachian tube dysfunction 10/02/2014    Priority: 3.   GERD (gastroesophageal reflux disease) 02/12/2013    Priority: 3.   Benign neoplasm of adrenal gland 11/30/2009    Priority: 3.   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   Past Surgical History:  Procedure Laterality Date   CATARACT EXTRACTION, BILATERAL  March 2022 and April 2022   CHOLECYSTECTOMY  2005   COLONOSCOPY  2008   negative   [place]   HERNIA REPAIR  2005   INNER EAR SURGERY  2006   Incus transposition;oscillculoplasty; Dr Thereasa Parkin EAR SURGERY  2011    Stirrup resection   SHOULDER ARTHROSCOPY  2004   Dr Mayer Camel   TONSILLECTOMY AND ADENOIDECTOMY     VASECTOMY      Family History  Problem Relation Age of Onset   Diverticulosis Mother    Diabetes Father        AAA   Sudden death Brother 44       CAD; MI @ 61, smoker, sedentary, overweight, diabetes, HLD   Diabetes Brother    Hyperlipidemia Brother    Colon cancer Maternal Aunt 82   Heart attack Paternal Grandfather 7   Stroke Neg Hx    Neuropathy Neg Hx    Multiple sclerosis Neg Hx    Migraines Neg Hx    Dementia Neg Hx     Medications- reviewed and updated Current Outpatient Medications  Medication Sig Dispense Refill   ALPRAZolam (XANAX) 0.25 MG tablet Take 0.125 mg at bedtime as needed by mouth for anxiety.     Ascorbic Acid (VITAMIN C PO) Take by mouth.     ASPIRIN 81 PO Take 81 mg by mouth daily.     atorvastatin (LIPITOR) 20 MG tablet TAKE ONE TABLET BY MOUTH DAILY 90 tablet 3   BIOTIN PO Take by mouth.     CALCIUM CITRATE PO Take by mouth.     Chlorpheniramine Maleate (ALLERGY RELIEF PO) Take 0.5 tablets  by mouth as needed.     Coenzyme Q10 (CO Q 10 PO) Take by mouth.     esomeprazole (NEXIUM) 20 MG capsule Take 1 capsule (20 mg total) by mouth daily. 90 capsule 2   IBUPROFEN PO Take by mouth as needed.     MAGNESIUM CITRATE PO Take by mouth.     meloxicam (MOBIC) 15 MG tablet Take 15 mg by mouth as needed.     Multiple Vitamin (MULTIVITAMIN) tablet Take 1 tablet by mouth daily.     Multiple Vitamins-Minerals (ZINC PO) Take by mouth.     Psyllium (METAMUCIL PO) Take by mouth.     Pyridoxine HCl (VITAMIN B-6 PO) Take by mouth.     No current facility-administered medications for this visit.    Allergies-reviewed and updated No Known Allergies  Social History   Social History Narrative   Mosie Epstein 2021. Married 2006, divorced 2010. 2 children (35 and 16 in 2016 in good health), no grandkids.    [employer] grad.       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       Hobbies: place in [place] in [place], outdoor activities, reading, follow things on PC      Right handed      Lives at home with wife and dog       Caffeine: 2 cups/day   Objective  Objective:  BP (!) 158/84   Pulse (!) 58   Temp 98 F (36.7 C) (Temporal)   Ht 6' 2" (1.88 m)   Wt 241 lb 9.6 oz (109.6 kg)   SpO2 98%   BMI 31.02 kg/m  Gen: NAD, resting comfortably HEENT: Mucous membranes are moist. Oropharynx normal. Some scarring on left TM- advised ENT follow up  Neck: no thyromegaly CV: RRR no murmurs rubs or gallops Lungs: CTAB no crackles, wheeze, rhonchi Abdomen: soft/nontender/nondistended/normal bowel sounds. No rebound or guarding.  Ext: no edema Skin: warm, dry Neuro: grossly normal, moves all extremities, PERRLA    Assessment and Plan  70 y.o. male presenting for annual physical.  Health Maintenance counseling: 1. Anticipatory guidance: Patient counseled regarding regular dental exams -q6 months, eye exams -yearly,  avoiding smoking and second hand smoke , limiting alcohol to 2 beverages per day - 2-3 per week.   2. Risk factor reduction:  Advised patient of need for regular exercise and diet rich and fruits and vegetables to reduce risk of heart attack and stroke. Exercise- 1.5-2 miles 5 days a week still. Diet-up 2 lbs from last year- home scales 233- discussed myfitnesspal.  Wt Readings from Last 3 Encounters:  06/17/21 241 lb 9.6 oz (109.6 kg)  03/23/21 237 lb 3.2 oz (107.6 kg)  01/13/21 244 lb (110.7 kg)  3. Immunizations/screenings/ancillary studies- high dose flu shot today Immunization History  Administered Date(s) Administered   Fluad Quad(high Dose 65+) 07/06/2020   Influenza, High Dose Seasonal PF 06/02/2017, 05/21/2018, 05/16/2019   Influenza,inj,Quad PF,6+ Mos 06/13/2019   Influenza,inj,quad, With Preservative 06/19/2015   Influenza-Unspecified 06/12/2014, 06/01/2017, 05/29/2018    PFIZER(Purple Top)SARS-COV-2 Vaccination 11/14/2019, 12/09/2019   Pneumococcal Conjugate-13 10/02/2014   Pneumococcal Polysaccharide-23 10/14/2015   Td 11/30/2009   Tdap 09/30/2015   Zoster Recombinat (Shingrix) 06/07/2018, 09/21/2018   Zoster, Live 03/23/2011  4. Prostate cancer screening- #Elevated PSA/prostate cancer history- MRI planned Saturday- had another PSA with urology and was 12 leading to MRI with Dr. Louis Meckel -has been under active surveillance- prior biopsy -planning repeat  biopsy as well  Lab Results  Component Value Date   PSA 10.56 (H) 03/23/2021   PSA 9.10 (H) 07/09/2020   PSA 7.72 (H) 11/01/2019   5. Colon cancer screening - 05/22/17 with 5 year repeat so 1 more year will be due 6. Skin cancer screening- Dr. Jones yearly. advised regular sunscreen use. Denies worrisome, changing, or new skin lesions.  7. Smoking associated screening (lung cancer screening, AAA screen 65-75, UA)- never smoker 8. STD screening - no concern for infidelity- declines testing  Status of chronic or acute concerns   #social update- lost dog of 14 years  #BP higher than usual- he will do some home monitoring and update us. Agrees to do for a week and then update us with goal <135/85  # Rash/Lyme's Disease  S:Patient was seen by Dr. Parker on 03/23/2021 for rash/fatigue with unclear etiology. Labs including CBC with differential, c-Met, TSH, ANA, CRP, sed rate, and Lyme disease titers were ordered during the encounter. Lyme disease did come back positive and patient was treated with doxycycline 100 mg twice daily for a 21-day course.  On 06/11/2021 patient did report that he felt better once completing the doxycycline - still fatigued and low energy on some days but overall better.  - No further workup was required as he was treated adequately   Today patient reports still having lingering fatigue afterwards. No bullseye but had red splotches he states A/P: thankfully has largely recovered-  he knows if has [facility] and [facility] symptoms to see us back   #Insomnia- Lisa Poulus- [facility] prescribes alprazolam low dose- 0.125 at night. Doesn't sleep well without  #hyperlipidemia with advanced microvascular ischemia on MRI brain sept 2020.  S: Medication: atorvastatin 20 mg daily and also on Aspirin 81 mg   Lab Results  Component Value Date   CHOL 170 11/01/2019   HDL 42.60 11/01/2019   LDLCALC 68 06/21/2018   LDLDIRECT 73 07/09/2020   TRIG 210.0 (H) 11/01/2019   CHOLHDL 4 11/01/2019   A/P: LDL close to ideal goal last visit-update lipids with labs   # Hyperglycemia/insulin resistance/prediabetes S:  Medication: preferred to avoid metformin in the past   - A1c slightly trended down last year - discussed working towards getting back down to 215-220 range-currently 241- 23 home scales Exercise and diet- 1.8-2 miles a day walking 5-6 days a week. GF Toni cooks well and tries to eat healthy Lab Results  Component Value Date   HGBA1C 5.7 (H) 07/09/2020   HGBA1C 5.9 11/01/2019   HGBA1C 6.0 06/21/2018   A/P: hopefully stable or improved- update a1c  # Osteoarthritis S:medication: Meloxicam as needed - in past -using sparingly- knows about kidney risk. Taking tylenol- discussed max dose 3 g per day. Intermittent aleve A/P: stable- continue tylenol. Voltren gel doesn't help him much    #Recurrent dizziness/ataxia-extensive prior eval by ENT and neurology without obvious cause S:last year at baseline patient stated not able to walk a straight line.  He also has ongoing issues where he gets Dizzy first thing in AM. Patient did report that it was improving in frequency of episodes - once every 4-6 weeks- overall better  - wondered if there may have been orthostatic element/mild dehydration element - also wonder with bradycardia if that could contribute - TSH was normal back in March 2021 - encouraged patient to try Gatorade in the morning before  episodes occur and perhaps lay back down for a few minutes and   then get back up and see how he does - last visit we discussed that patient could've considered an apple watch to monitor heart rhythm during these episodes-would be difficult to wear a monitor since these may only occur every 4 to 6 weeks A/P: pretty stable- still no clear cause- continue to monitor. [place] about inner ear and chronic microvascular ischemia  # GERD S:Medication: 20 mg OTC  A/P: controlled but b12 low normal- advised b12 over the counter once a week   Recommended follow up: Return in about 1 year (around 06/17/2022) for a physical or sooner if needed. Future Appointments  Date Time Provider [place]  06/26/2021 11:40 AM GI-315 MR 3 GI-315MRI [facility] [address] [facility]    Lab/Order associations:   ICD-10-CM   1. Preventative health care  Z00.00     2. HYPERLIPIDEMIA  E78.2 CBC with Differential/Platelet    Comprehensive metabolic panel    Lipid panel    3. Hyperglycemia  R73.9 Hemoglobin A1c    4. Recurrent episodes Dizziness/Ataxia  R42     5. Primary osteoarthritis involving multiple joints  M15.9     6. Lyme disease  A69.20      No orders of the defined types were placed in this encounter.  I,Harris Phan,acting as a Education administrator for Garret Reddish, MD.,have documented all relevant documentation on the behalf of Garret Reddish, MD,as directed by  Garret Reddish, MD while in the presence of Garret Reddish, MD.  I, Garret Reddish, MD, have reviewed all documentation for this visit. The documentation on 06/17/21 for the exam, diagnosis, procedures, and orders are all accurate and complete.   Return precautions advised.  Garret Reddish, MD

## 2021-06-18 ENCOUNTER — Other Ambulatory Visit (INDEPENDENT_AMBULATORY_CARE_PROVIDER_SITE_OTHER): Payer: Medicare PPO

## 2021-06-18 DIAGNOSIS — R739 Hyperglycemia, unspecified: Secondary | ICD-10-CM

## 2021-06-18 DIAGNOSIS — E782 Mixed hyperlipidemia: Secondary | ICD-10-CM | POA: Diagnosis not present

## 2021-06-18 LAB — COMPREHENSIVE METABOLIC PANEL
ALT: 24 U/L (ref 0–53)
AST: 20 U/L (ref 0–37)
Albumin: 4.5 g/dL (ref 3.5–5.2)
Alkaline Phosphatase: 76 U/L (ref 39–117)
BUN: 16 mg/dL (ref 6–23)
CO2: 28 mEq/L (ref 19–32)
Calcium: 9.7 mg/dL (ref 8.4–10.5)
Chloride: 105 mEq/L (ref 96–112)
Creatinine, Ser: 1.01 mg/dL (ref 0.40–1.50)
GFR: 74.68 mL/min (ref >60.00)
Glucose, Bld: 119 mg/dL — ABNORMAL HIGH (ref 70–99)
Potassium: 4.3 mEq/L (ref 3.5–5.1)
Sodium: 140 mEq/L (ref 135–145)
Total Bilirubin: 0.6 mg/dL (ref 0.2–1.2)
Total Protein: 7.4 g/dL (ref 6.0–8.3)

## 2021-06-18 LAB — CBC WITH DIFFERENTIAL/PLATELET
Basophils Absolute: 0 10*3/uL (ref 0.0–0.1)
Basophils Relative: 0.9 % (ref 0.0–3.0)
Eosinophils Absolute: 0.1 10*3/uL (ref 0.0–0.7)
Eosinophils Relative: 1.8 % (ref 0.0–5.0)
HCT: 41.2 % (ref 39.0–52.0)
Hemoglobin: 13.7 g/dL (ref 13.0–17.0)
Lymphocytes Relative: 31.8 % (ref 12.0–46.0)
Lymphs Abs: 1.6 10*3/uL (ref 0.7–4.0)
MCHC: 33.1 g/dL (ref 30.0–36.0)
MCV: 88.6 fl (ref 78.0–100.0)
Monocytes Absolute: 0.5 10*3/uL (ref 0.1–1.0)
Monocytes Relative: 8.9 % (ref 3.0–12.0)
Neutro Abs: 2.9 10*3/uL (ref 1.4–7.7)
Neutrophils Relative %: 56.6 % (ref 43.0–77.0)
Platelets: 252 10*3/uL (ref 150.0–400.0)
RBC: 4.65 Mil/uL (ref 4.22–5.81)
RDW: 14.4 % (ref 11.5–15.5)
WBC: 5.1 10*3/uL (ref 4.0–10.5)

## 2021-06-18 LAB — HEMOGLOBIN A1C: Hgb A1c MFr Bld: 6 % (ref 4.6–6.5)

## 2021-06-18 LAB — LIPID PANEL
Cholesterol: 140 mg/dL (ref 0–200)
HDL: 44.4 mg/dL (ref >39.00)
LDL Cholesterol: 66 mg/dL (ref 0–99)
NonHDL: 95.6
Total CHOL/HDL Ratio: 3
Triglycerides: 148 mg/dL (ref 0.0–149.0)
VLDL: 29.6 mg/dL (ref 0.0–40.0)

## 2021-06-26 ENCOUNTER — Other Ambulatory Visit: Payer: Self-pay

## 2021-06-26 ENCOUNTER — Ambulatory Visit
Admission: RE | Admit: 2021-06-26 | Discharge: 2021-06-26 | Disposition: A | Discharge status: Home / Self Care | Payer: Medicare PPO | Source: Ambulatory Visit | Attending: Urology | Admitting: Urology

## 2021-06-26 DIAGNOSIS — R972 Elevated prostate specific antigen [PSA]: Secondary | ICD-10-CM | POA: Diagnosis not present

## 2021-06-26 DIAGNOSIS — C61 Malignant neoplasm of prostate: Secondary | ICD-10-CM

## 2021-06-26 MED ORDER — GADOBENATE DIMEGLUMINE 529 MG/ML IV SOLN
20.0000 mL | Freq: Once | INTRAVENOUS | Status: AC | PRN
  Administered 2021-06-26: 20 mL via INTRAVENOUS

## 2021-07-08 DIAGNOSIS — H90A31 Mixed conductive and sensorineural hearing loss, unilateral, right ear with restricted hearing on the contralateral side: Secondary | ICD-10-CM | POA: Diagnosis not present

## 2021-07-08 DIAGNOSIS — Z9629 Presence of other otological and audiological implants: Secondary | ICD-10-CM | POA: Diagnosis not present

## 2021-07-08 DIAGNOSIS — Z8669 Personal history of other diseases of the nervous system and sense organs: Secondary | ICD-10-CM | POA: Diagnosis not present

## 2021-07-08 DIAGNOSIS — H6981 Other specified disorders of Eustachian tube, right ear: Secondary | ICD-10-CM | POA: Diagnosis not present

## 2021-07-08 DIAGNOSIS — H903 Sensorineural hearing loss, bilateral: Secondary | ICD-10-CM | POA: Diagnosis not present

## 2021-07-08 DIAGNOSIS — H90A22 Sensorineural hearing loss, unilateral, left ear, with restricted hearing on the contralateral side: Secondary | ICD-10-CM | POA: Diagnosis not present

## 2021-07-08 DIAGNOSIS — Z9889 Other specified postprocedural states: Secondary | ICD-10-CM | POA: Diagnosis not present

## 2021-07-20 DIAGNOSIS — C61 Malignant neoplasm of prostate: Secondary | ICD-10-CM | POA: Diagnosis not present

## 2021-07-26 ENCOUNTER — Telehealth: Payer: Self-pay

## 2021-07-26 NOTE — Telephone Encounter (Signed)
Patient was scheduled for 11/29 for a BP follow up.  I have reached out to patient to move appt due to Dr. Yong Channel being out of the office.  I have moved appt to 12/15.  Patient wanted to know if he needed to either come in sooner or if Dr. Yong Channel wanted him to do anything between now and then.  State his bp has been all over the place.  States systolic runs between 012'J to 150's.  While diastolic is running between low 80's to mid 90's.

## 2021-07-27 ENCOUNTER — Ambulatory Visit: Payer: Medicare PPO | Admitting: Family Medicine

## 2021-07-27 ENCOUNTER — Encounter: Payer: Self-pay | Admitting: Family Medicine

## 2021-07-27 NOTE — Telephone Encounter (Signed)
See below

## 2021-07-27 NOTE — Telephone Encounter (Signed)
Called and spoke with pt and pt will communicate bp readings via mychart.

## 2021-07-27 NOTE — Telephone Encounter (Signed)
Can either give you the last 10 readings or send them to me by MyChart?  I would like to do an average

## 2021-07-28 NOTE — Telephone Encounter (Signed)
Patient is calling in, he wasn't able to get into his mychart account to read what Dr.Hunter wrote.

## 2021-08-03 DIAGNOSIS — C61 Malignant neoplasm of prostate: Secondary | ICD-10-CM | POA: Diagnosis not present

## 2021-08-03 DIAGNOSIS — D075 Carcinoma in situ of prostate: Secondary | ICD-10-CM | POA: Diagnosis not present

## 2021-08-10 DIAGNOSIS — F4322 Adjustment disorder with anxiety: Secondary | ICD-10-CM | POA: Diagnosis not present

## 2021-08-10 DIAGNOSIS — F900 Attention-deficit hyperactivity disorder, predominantly inattentive type: Secondary | ICD-10-CM | POA: Diagnosis not present

## 2021-08-10 DIAGNOSIS — F411 Generalized anxiety disorder: Secondary | ICD-10-CM | POA: Diagnosis not present

## 2021-08-12 ENCOUNTER — Encounter: Payer: Self-pay | Admitting: Family Medicine

## 2021-08-12 ENCOUNTER — Ambulatory Visit: Payer: Medicare PPO | Admitting: Family Medicine

## 2021-08-12 ENCOUNTER — Other Ambulatory Visit: Payer: Self-pay

## 2021-08-12 VITALS — BP 132/70 | HR 60 | Temp 97.2°F | Wt 242.8 lb

## 2021-08-12 DIAGNOSIS — C61 Malignant neoplasm of prostate: Secondary | ICD-10-CM

## 2021-08-12 DIAGNOSIS — R030 Elevated blood-pressure reading, without diagnosis of hypertension: Secondary | ICD-10-CM | POA: Diagnosis not present

## 2021-08-12 NOTE — Progress Notes (Signed)
Phone [phone] In person visit   Subjective:   Peter Novak is a 70 y.o. year old very pleasant male patient who presents for/with See problem oriented charting Chief Complaint  Patient presents with   Hypertension    His machine reads 144/81. Unsure if his BP has been high or if his machine is wrong   This visit occurred during the SARS-CoV-2 public health emergency.  Safety protocols were in place, including screening questions prior to the visit, additional usage of staff PPE, and extensive cleaning of exam room while observing appropriate contact time as indicated for disinfecting solutions.   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    Priority: High   ADD (attention deficit disorder) 10/02/2014    Priority: Medium    Insomnia 10/02/2014    Priority: Medium    Osteoarthritis 10/02/2014    Priority: Medium    Prostate cancer ([place]) 02/12/2013    Priority: Medium    Hyperglycemia 11/30/2009    Priority: Medium    History of melanoma 11/30/2009    Priority: Medium    HYPERLIPIDEMIA 07/10/2007    Priority: Medium    Bradycardia 11/05/2019    Priority: Low   Migraine aura without headache (migraine equivalents) 04/16/2019    Priority: Low   Erectile dysfunction 10/02/2014    Priority: Low   Eustachian tube dysfunction 10/02/2014    Priority: Low   GERD (gastroesophageal reflux disease) 02/12/2013    Priority: Low   Benign neoplasm of adrenal gland 11/30/2009    Priority: Low   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     Ascorbic Acid (VITAMIN C PO) Take by mouth.     ASPIRIN 81 PO Take 81 mg by mouth daily.     atorvastatin (LIPITOR) 20 MG tablet TAKE ONE TABLET BY MOUTH DAILY 90 tablet 3   BIOTIN PO Take by mouth.     CALCIUM CITRATE PO Take by mouth.     Chlorpheniramine Maleate (ALLERGY RELIEF PO) Take 0.5 tablets by mouth as needed.     Coenzyme Q10 (CO Q 10 PO) Take by mouth.     esomeprazole (NEXIUM) 20 MG capsule Take 1 capsule (20 mg total) by mouth daily. 90 capsule 2   IBUPROFEN PO Take by mouth as needed.     MAGNESIUM CITRATE PO Take by mouth.     meloxicam (MOBIC) 15 MG tablet Take 15 mg by mouth as needed.     Multiple Vitamin (MULTIVITAMIN) tablet Take 1 tablet by mouth daily.     Multiple Vitamins-Minerals (ZINC PO) Take by mouth.     Psyllium (METAMUCIL PO) Take by mouth.     Pyridoxine HCl (VITAMIN B-6 PO) Take by mouth.     No current facility-administered medications for this visit.     Objective:  BP 132/70 (BP Location: Left Arm, Patient Position: Sitting, Cuff Size: Normal)    Pulse 60    Temp (!) 97.2 F (36.2 C) (Oral)    Wt 242 lb  12.8 oz (110.1 kg)    SpO2 95%    BMI 31.17 kg/m  Gen: NAD, resting comfortably CV: RRR no murmurs rubs or gallops Lungs: CTAB no crackles, wheeze, rhonchi Abdomen: soft/nontender-patient reports some mild tenderness in the right lower quadrant but not appreciated on exam today/nondistended/normal bowel sounds. No rebound or guarding.  Ext: no edema    Assessment and Plan   # Prostate cancer- diagnosed by Dr. Louis Meckel S:had one are on biospy that was gleason 8 per patient ( I can't see pathology) and several others with signs of cancer- waiting on PET scan.  - will meet with cancer team including Dr. Alinda Money after getting results back  A/P: Patient will complete PET/CT with urology and then this will inform next steps  #elevated BP reading S: medication: none at present - last visit BP was  running higher than usual and he planned to do some home readings- 144/81 on average - not sure if his cuff is accurate though- asks to update today  -still walking 2 miles a day and trying to eat reasonably healthy  HOME READING WAS 144/81 COMPARED TO 132/70  Home reading was 144/81 vs 132/70 here Then repeat was 142/87 vs 140/78- we did this with elevating the arm (he has not been elevating arm at home  Last 10 home readings average 145/82 BP Readings from Last 3 Encounters:  08/12/21 132/70  06/17/21 (!) 158/84  03/23/21 138/84  A/P: Blood pressure has been elevated at home but has not improved reading in office today.  Arm has been more down toward his side when he has been checking his readings so I wonder if that is contributing to the elevations-he is given instructions today on how to check at home and he is going to do checks over the next 2 weeks and then update [place].  I am not going to diagnose hypertension at this time until I get more confirmatory readings.  Recommended follow up: Update me in 2 weeks by MyChart   Lab/Order associations:   ICD-10-CM   1. Elevated blood pressure reading  R03.0     2. Prostate cancer ([place])  C61       Time Spent: 26 minutes of total time (2:16 PM- 2:42 PM) was spent on the date of the encounter performing the following actions: chart review prior to seeing the patient, obtaining history, performing a medically necessary exam, counseling on the treatment plan, placing orders, and documenting in our EHR.    I,Harris Phan,acting as a Education administrator for Garret Reddish, MD.,have documented all relevant documentation on the behalf of Garret Reddish, MD,as directed by  Garret Reddish, MD while in the presence of Garret Reddish, MD.    I, Garret Reddish, MD, have reviewed all documentation for this visit. The documentation on 08/12/21 for the exam, diagnosis, procedures, and orders are all accurate and complete.   Return precautions advised.  Garret Reddish, MD

## 2021-08-12 NOTE — Patient Instructions (Addendum)
Please monitor blood pressure again for another 2 weeks and then update me via MyChart.  Goal for home blood pressure is less than 135/85-if elevated above this we will consider medication since you are already working on exercise and healthy eating  Recommended follow up: Return in about 6 months (around 02/10/2022) for follow up- or sooner if needed.  Please make sure to schedule this before you leave -Certainly may need to see each other sooner depending on blood pressure readings

## 2021-08-13 ENCOUNTER — Other Ambulatory Visit (HOSPITAL_COMMUNITY): Payer: Self-pay | Admitting: Urology

## 2021-08-13 DIAGNOSIS — C61 Malignant neoplasm of prostate: Secondary | ICD-10-CM

## 2021-08-26 ENCOUNTER — Ambulatory Visit (HOSPITAL_COMMUNITY)
Admission: RE | Admit: 2021-08-26 | Discharge: 2021-08-26 | Disposition: A | Discharge status: Home / Self Care | Payer: Medicare PPO | Source: Ambulatory Visit | Attending: Urology | Admitting: Urology

## 2021-08-26 ENCOUNTER — Other Ambulatory Visit: Payer: Self-pay

## 2021-08-26 DIAGNOSIS — C61 Malignant neoplasm of prostate: Secondary | ICD-10-CM | POA: Diagnosis not present

## 2021-08-26 DIAGNOSIS — R911 Solitary pulmonary nodule: Secondary | ICD-10-CM | POA: Diagnosis not present

## 2021-08-26 MED ORDER — PIFLIFOLASTAT F 18 (PYLARIFY) INJECTION
9.0000 | Freq: Once | INTRAVENOUS | Status: AC
  Administered 2021-08-26: 14:00:00 10 via INTRAVENOUS

## 2021-09-08 DIAGNOSIS — N5201 Erectile dysfunction due to arterial insufficiency: Secondary | ICD-10-CM | POA: Diagnosis not present

## 2021-09-08 DIAGNOSIS — C61 Malignant neoplasm of prostate: Secondary | ICD-10-CM | POA: Diagnosis not present

## 2021-09-13 NOTE — Progress Notes (Signed)
I called pt to introduce myself as the Prostate Nurse Navigator and the Coordinator of the Prostate [facility].   1. I confirmed with the patient he is aware of his referral to the clinic 1/24, arriving @ 12:30 pm.   2. I discussed the format of the clinic and the physicians he will be seeing that day.   3. I discussed where the clinic is located and how to contact me.   4. I confirmed his address and informed him I would be mailing a packet of information and forms to be completed. I asked him to bring them with him the day of his appointment.    He voiced understanding of the above. I asked him to call me if he has any questions or concerns regarding his appointments or the forms he needs to complete.

## 2021-09-20 NOTE — Progress Notes (Addendum)
° °                              Care Plan Summary  Name: Deleon Passe DOB: [birth year]   Your Medical Team:   Urologist -  Dr. Raynelle Bring, [facility]  Radiation Oncologist - Dr. Tyler Pita, [facility]   Medical Oncologist - Dr. Zola Button, [place]  Recommendations: 1) Surgery or  2) Androgen Depravation Therapy with   3) Radiation   * These recommendations are based on information available as of todays consult.  Recommendations may change depending on the results of further tests or exams.    Next Steps: 1) Consider your options and contact [name] with any questions or treatment decisions.    When appointments need to be scheduled, you will be contacted by [facility] and/or [facility].  Questions?  Please do not hesitate to call [name], BSN, RN at [phone] with any questions or concerns.  [name] is your Oncology Nurse Navigator and is available to assist you while youre receiving your medical care at [facility].

## 2021-09-20 NOTE — Progress Notes (Deleted)
° °                              Care Plan Summary  Name: Peter Novak DOB: 02/25/1951   Your Medical Team:   Urologist -  Dr. Raynelle Bring, [facility]  Radiation Oncologist - Dr. Tyler Pita, [facility]   Medical Oncologist - Dr. Zola Button, [place]  Recommendations: 1)  2)  3)  * These recommendations are based on information available as of todays consult.      Recommendations may change depending on the results of further tests or exams.    Next Steps: 1)  2) 3)  When appointments need to be scheduled, you will be contacted by [facility] and/or [facility].  Questions?  Please do not hesitate to call [name], BSN, RN at [phone] with any questions or concerns.  [name] is your Oncology Nurse Navigator and is available to assist you while youre receiving your medical care at [facility].

## 2021-09-21 ENCOUNTER — Inpatient Hospital Stay: Payer: Medicare PPO | Attending: Oncology | Admitting: Oncology

## 2021-09-21 ENCOUNTER — Ambulatory Visit (HOSPITAL_BASED_OUTPATIENT_CLINIC_OR_DEPARTMENT_OTHER): Payer: Medicare PPO | Admitting: Genetic Counselor

## 2021-09-21 ENCOUNTER — Ambulatory Visit
Admission: RE | Admit: 2021-09-21 | Discharge: 2021-09-21 | Disposition: A | Discharge status: Home / Self Care | Payer: Medicare PPO | Source: Ambulatory Visit | Attending: Radiation Oncology | Admitting: Radiation Oncology

## 2021-09-21 ENCOUNTER — Other Ambulatory Visit: Payer: Self-pay

## 2021-09-21 ENCOUNTER — Encounter: Payer: Self-pay | Admitting: Genetic Counselor

## 2021-09-21 VITALS — BP 157/92 | HR 50 | Temp 97.6°F | Resp 20 | Ht 74.0 in | Wt 245.6 lb

## 2021-09-21 DIAGNOSIS — Z791 Long term (current) use of non-steroidal anti-inflammatories (NSAID): Secondary | ICD-10-CM

## 2021-09-21 DIAGNOSIS — C61 Malignant neoplasm of prostate: Secondary | ICD-10-CM | POA: Diagnosis not present

## 2021-09-21 DIAGNOSIS — Z1379 Encounter for other screening for genetic and chromosomal anomalies: Secondary | ICD-10-CM | POA: Insufficient documentation

## 2021-09-21 DIAGNOSIS — Z803 Family history of malignant neoplasm of breast: Secondary | ICD-10-CM

## 2021-09-21 DIAGNOSIS — R351 Nocturia: Secondary | ICD-10-CM | POA: Diagnosis not present

## 2021-09-21 DIAGNOSIS — Z7982 Long term (current) use of aspirin: Secondary | ICD-10-CM

## 2021-09-21 DIAGNOSIS — Z800 Family history of malignant neoplasm of digestive organs: Secondary | ICD-10-CM | POA: Diagnosis not present

## 2021-09-21 DIAGNOSIS — Z79899 Other long term (current) drug therapy: Secondary | ICD-10-CM | POA: Diagnosis not present

## 2021-09-21 NOTE — Progress Notes (Signed)
Radiation Oncology         [phone]) [phone] ________________________________  Multidisciplinary Prostate Cancer Clinic  Initial Radiation Oncology Consultation  Name: CORTLANDT CAPUANO MRN: [id number]  Date: 09/21/2021  DOB: [birth year]  WU:[id number], Brayton Mars, MD  Ardis Hughs, MD   REFERRING PHYSICIAN: Ardis Hughs, MD  DIAGNOSIS: 71 y.o. 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 Prostate cancer (Home)  C61       HISTORY OF PRESENT ILLNESS::Fris[place] G Cozart is a 72 y.o. gentleman. He was initially diagnosed with low-volume Gleason 3+3 prostate cancer back in 10/2014 by Dr. Karsten Ro.    They opted to proceed with active surveillance. At routine follow up in 05/2021 with Dr. Louis Meckel, his PSA was found to have increased to 12.9, from 7.96 in 05/2019. Digital rectal examination was performed at that time revealing a new, discrete firm left base prostate nodule. This prompted prostate MRI on 06/26/21 showing a PI-RADS 4 lesion in the left mid gland peripheral zone and a mildly nodular transitional zone consistent with BPH.    The patient proceeded to MRI fusion transrectal ultrasound with 15 biopsies of the prostate on 08/03/21.  The prostate volume measured 30 cc.  Out of 15 core biopsies, 9 were positive, including all left-sided cores.  The maximum Gleason score was 4+4, and this was seen in the left mid, left base, left mid lateral (with perineural invasion and extraprostatic extension), and two cores from the MRI ROI (with PNI). Additionally, Gleason 4+3 was seen in the left base lateral (with PNI), left apex lateral (with PNI), and the third MRI ROI sample, and Gleason 3+4 in the left apex (small focus).   He underwent PSMA scan on 08/26/21 for disease staging and this showed a large area of radiotracer accumulation in the left prostate, corresponding to the lesion seen on MRI but no tracer-avid areas  of metastasis. There were sclerotic lesions seen in the spine and pelvis that were favored to represent bone islands despite the large size. Additionally, there was a small pulmonary nodule in the RUL, indeterminant.   The patient reviewed the biopsy and imaging results with his urologist and he has kindly been referred today to the multidisciplinary prostate cancer clinic for presentation of pathology and radiology studies in our conference for discussion of potential radiation treatment options and clinical evaluation.  PREVIOUS RADIATION THERAPY: No  PAST MEDICAL HISTORY:  has a past medical history of ADD (attention deficit disorder with hyperactivity), Constipation, Hearing loss, History of skin cancer, and Hyperlipidemia.    PAST SURGICAL HISTORY: Past Surgical History:  Procedure Laterality Date   CATARACT EXTRACTION, BILATERAL  March 2022 and April 2022   CHOLECYSTECTOMY  2005   COLONOSCOPY  2008   negative   [place]   HERNIA REPAIR  2005   INNER EAR SURGERY  2006   Incus transposition;oscillculoplasty; Dr Thereasa Parkin EAR SURGERY  2011   Stirrup resection   SHOULDER ARTHROSCOPY  2004   Dr Mayer Camel   TONSILLECTOMY AND ADENOIDECTOMY     VASECTOMY      FAMILY HISTORY: family history includes Breast cancer (age of onset: 63) in his cousin; Cancer (age of onset: 78) in his paternal grandmother; Colon cancer in his maternal aunt; Diabetes in his brother and father; Diverticulosis in his mother; Heart attack (age of onset: 47) in his paternal grandfather; Hyperlipidemia in  his brother; Sudden death (age of onset: 23) in his brother.  SOCIAL HISTORY:  reports that he has never smoked. He has never used smokeless tobacco. He reports current alcohol use of about 1.0 - 2.0 standard drink per week. He reports that he does not use drugs.  ALLERGIES: Patient has no known allergies.  MEDICATIONS:  Current Outpatient Medications  Medication Sig Dispense Refill   ALPRAZolam  (XANAX) 0.25 MG tablet Take 0.125 mg at bedtime as needed by mouth for anxiety.     Ascorbic Acid (VITAMIN C PO) Take by mouth.     ASPIRIN 81 PO Take 81 mg by mouth daily.     atorvastatin (LIPITOR) 20 MG tablet TAKE ONE TABLET BY MOUTH DAILY 90 tablet 3   BIOTIN PO Take by mouth. (Patient not taking: Reported on 09/21/2021)     CALCIUM CITRATE PO Take by mouth.     Chlorpheniramine Maleate (ALLERGY RELIEF PO) Take 0.5 tablets by mouth as needed.     Coenzyme Q10 (CO Q 10 PO) Take by mouth.     esomeprazole (NEXIUM) 20 MG capsule Take 1 capsule (20 mg total) by mouth daily. 90 capsule 2   IBUPROFEN PO Take by mouth as needed.     MAGNESIUM CITRATE PO Take by mouth.     meloxicam (MOBIC) 15 MG tablet Take 15 mg by mouth as needed. (Patient not taking: Reported on 09/21/2021)     Multiple Vitamin (MULTIVITAMIN) tablet Take 1 tablet by mouth daily.     Multiple Vitamins-Minerals (ZINC PO) Take by mouth.     Psyllium (METAMUCIL PO) Take by mouth.     Pyridoxine HCl (VITAMIN B-6 PO) Take by mouth.     No current facility-administered medications for this encounter.    REVIEW OF SYSTEMS:  On review of systems, the patient reports that he is doing well overall. He denies any chest pain, shortness of breath, cough, fevers, chills, night sweats, unintended weight changes. He denies any bowel disturbances, and denies abdominal pain, nausea or vomiting. He denies any new musculoskeletal or joint aches or pains. His IPSS was 5, indicating mild urinary symptoms. His SHIM was 19, indicating he has mild erectile dysfunction. A complete review of systems is obtained and is otherwise negative.   PHYSICAL EXAM:  Wt Readings from Last 3 Encounters:  09/21/21 245 lb 9.6 oz (111.4 kg)  08/12/21 242 lb 12.8 oz (110.1 kg)  06/17/21 241 lb 9.6 oz (109.6 kg)   Temp Readings from Last 3 Encounters:  09/21/21 97.6 F (36.4 C)  08/12/21 (!) 97.2 F (36.2 C) (Oral)  06/17/21 98 F (36.7 C) (Temporal)   BP  Readings from Last 3 Encounters:  09/21/21 (!) 157/92  08/12/21 132/70  06/17/21 (!) 158/84   Pulse Readings from Last 3 Encounters:  09/21/21 (!) 50  08/12/21 60  06/17/21 (!) 58    /10  In general this is a well appearing Caucasian male in no acute distress. He's alert and oriented x4 and appropriate throughout the examination. Cardiopulmonary assessment is negative for acute distress and he exhibits normal effort.    KPS = 100  100 - Normal; no complaints; no evidence of disease. 90   - Able to carry on normal activity; minor signs or symptoms of disease. 80   - Normal activity with effort; some signs or symptoms of disease. 58   - Cares for self; unable to carry on normal activity or to do active work. 60   - Requires  occasional assistance, but is able to care for most of his personal needs. 50   - Requires considerable assistance and frequent medical care. 12   - Disabled; requires special care and assistance. 62   - Severely disabled; hospital admission is indicated although death not imminent. 9   - Very sick; hospital admission necessary; active supportive treatment necessary. 10   - Moribund; fatal processes progressing rapidly. 0     - Dead  Karnofsky DA, Abelmann [place], Craver LS and [facility] [phone]) The use of the nitrogen mustards in the palliative treatment of carcinoma: with particular reference to bronchogenic carcinoma Cancer 1 634-56   LABORATORY DATA:  Lab Results  Component Value Date   WBC 5.1 06/18/2021   HGB 13.7 06/18/2021   HCT 41.2 06/18/2021   MCV 88.6 06/18/2021   PLT 252.0 06/18/2021   Lab Results  Component Value Date   NA 140 06/18/2021   K 4.3 06/18/2021   CL 105 06/18/2021   CO2 28 06/18/2021   Lab Results  Component Value Date   ALT 24 06/18/2021   AST 20 06/18/2021   ALKPHOS 76 06/18/2021   BILITOT 0.6 06/18/2021     RADIOGRAPHY: NM PET (PSMA) SKULL TO MID THIGH  Result Date: 08/27/2021 CLINICAL DATA:  Elevated PSA and  recent diagnosis of prostate neoplasm. Most recent PSA of 12.9. EXAM: NUCLEAR MEDICINE PET SKULL BASE TO THIGH TECHNIQUE: 10.0 mCi F18 Piflufolastat (Pylarify) was injected intravenously. Full-ring PET imaging was performed from the skull base to thigh after the radiotracer. CT data was obtained and used for attenuation correction and anatomic localization. COMPARISON:  Prostate MRI of June 26, 2021. FINDINGS: NECK No radiotracer activity in neck lymph nodes. Incidental CT finding: Atrophy of RIGHT submandibular gland due to sialolith measuring 7 mm (image 53/4) no surrounding inflammation. CHEST No radiotracer accumulation within mediastinal or hilar lymph nodes. 6 x 4 mm RIGHT upper lobe pulmonary nodule without radiotracer accumulation. Incidental CT finding: Paste that ABDOMEN/PELVIS Prostate: Marked radiotracer accumulation in the LEFT prostate gland corresponding to the abnormality discovered on the recent MRI of the prostate with a maximum SUV of 9.2. Lymph nodes: No abnormal radiotracer accumulation within pelvic or abdominal nodes. Liver: No evidence of liver metastasis Incidental CT finding: Cholecystectomy. No substantial biliary duct distension. Normal appearance of the pancreas and spleen. Adrenal glands are normal. No acute findings relative to the kidneys. Urinary bladder is collapsed. No acute gastrointestinal findings. Colonic diverticulosis. Aortic atherosclerosis without aneurysmal dilation. SKELETON A sclerotic focus in the thoracic spine at the T4 level along the LEFT lateral aspect is densely sclerotic and not associated with increased radiotracer accumulation, measuring 17 mm. Similarly dense area in the RIGHT sacrum has a more classic appearance for a bone island measuring 5 mm and without radiotracer accumulation Subtle sclerosis in the RIGHT pubic root (image 221/4) also without radiotracer accumulation no destructive bone findings. IMPRESSION: Large area of radiotracer accumulation in  the LEFT prostate corresponding to the lesion seen on MRI compatible with known prostate cancer. No tracer avid areas of metastasis. Sclerotic lesions seen in the spine and the pelvis are favored to represent bone islands despite large size of spinal lesion. Small pulmonary nodule in the RIGHT upper lobe. No follow-up needed if patient is low-risk. Non-contrast chest CT can be considered in 12 months if patient is high-risk. This recommendation follows the consensus statement: Guidelines for Management of Incidental Pulmonary Nodules Detected on CT Images: From the Fleischner Society 2017; Radiology 2017; 284:228-243.  Electronically Signed   By: Zetta Bills M.D.   On: 08/27/2021 14:14      IMPRESSION/PLAN: 71 y.o. gentleman with Stage T2a adenocarcinoma of the prostate with a Gleason score of 4+4 and a PSA of 12.9.    We discussed the patient's workup and outlined the nature of prostate cancer in this setting. The patient's T stage, Gleason's score, and PSA put him into the high risk group. Accordingly, he is eligible for a variety of potential treatment options including prostatectomy or LT-ADT in combination with either 8 weeks of external radiation or 5 weeks of external radiation with an upfront brachytherapy boost. We discussed the available radiation techniques, and focused on the details and logistics of delivery. We discussed and outlined the risks, benefits, short and long-term effects associated with radiotherapy and compared and contrasted these with prostatectomy. We discussed the role of SpaceOAR gel in reducing the rectal toxicity associated with radiotherapy. We also detailed the role of ADT in the treatment of high risk prostate cancer and outlined the associated side effects that could be expected with this therapy. He appears to have a good understanding of his disease and our treatment recommendations which are of curative intent.  He was encouraged to ask questions that were answered to  his stated satisfaction.  The patient focused most of his questions and interest in robotic-assisted laparoscopic radical prostatectomy.  We discussed some of the potential advantages of surgery including surgical staging, the availability of salvage radiotherapy to the prostatic fossa, and the confidence associated with immediate biochemical response.  We discussed some of the potential proven indications for postoperative radiotherapy including positive margins, extracapsular extension, and seminal vesicle involvement. We also talked about some of the other potential findings leading to a recommendation for radiotherapy including a non-zero postoperative PSA and positive lymph nodes.   At the conclusion of our conversation, the patient is leaning towards proceeding with prostatectomy. He will discuss this further with Dr. Alinda Money as part of today's prostate clinic. We enjoyed meeting with him today, and will look forward to following his progress. He has our contact information and will let us know if he has any additional questions or ultimately decides to proceed with radiotherapy.  We personally spent 60 minutes in this encounter including chart review, reviewing radiological studies, meeting face-to-face with the patient, entering orders and completing documentation.    Nicholos Johns, PA-C    Tyler Pita, MD  [facility] Direct Dial: [phone]   Fax: [phone] [email or web address]   Skype   [email or web address]   This document serves as a record of services personally performed by Tyler Pita, MD and Freeman Caldron, PA-C. It was created on their behalf by Wilburn Mylar, a trained medical scribe. The creation of this record is based on the scribe's personal observations and the provider's statements to them. This document has been checked and approved by the attending provider.

## 2021-09-21 NOTE — Addendum Note (Signed)
Addended by: Rennis Harding on: 09/21/2021 05:02 PM   Modules accepted: Orders

## 2021-09-21 NOTE — Progress Notes (Signed)
Reason for the request:    Prostate cancer  HPI: I was asked by Dr. Louis Meckel to evaluate [name] for the evaluation of prostate cancer.  He is a 71 year old man with history of prostate cancer dating back to 2016.  At that time his PSA was 8.7 and underwent a prostate biopsy which showed a low-grade malignancy.  He remained on active surveillance till about 2020.  His PSA started to rise up to 12.9 in 2022.  Based on these findings he underwent an MRI in October 2022 which showed a left lesion in the left mid gland concerning for high-grade cancer PI-RADS 4.  Based on these findings he underwent a prostate biopsy on August 19, 2021 which showed a Gleason 4+4 = 8 high-grade cancer.  PSMA PET scan at that time showed no evidence of metastatic disease.  Clinically, he reports very little urinary symptoms at this time.  He does report occasional frequency and nocturia but overall mild.  He denies any chest pain or shortness of breath.  He denies any constitutional symptoms.   He does not report any headaches, blurry vision, syncope or seizures. Does not report any fevers, chills or sweats.  Does not report any cough, wheezing or hemoptysis.  Does not report any chest pain, palpitation, orthopnea or leg edema.  Does not report any nausea, vomiting or abdominal pain.  Does not report any constipation or diarrhea.  Does not report any skeletal complaints.    Does not report frequency, urgency or hematuria.  Does not report any skin rashes or lesions. Does not report any heat or cold intolerance.  Does not report any lymphadenopathy or petechiae.  Does not report any anxiety or depression.  Remaining review of systems is negative.     Past Medical History:  Diagnosis Date   ADD (attention deficit disorder with hyperactivity)    Constipation    Hearing loss    History of skin cancer    Melanoma X 2   Hyperlipidemia   :   Past Surgical History:  Procedure Laterality Date   CATARACT EXTRACTION,  BILATERAL  March 2022 and April 2022   CHOLECYSTECTOMY  2005   COLONOSCOPY  2008   negative   ELBOW SURGERY  1994   HERNIA REPAIR  2005   INNER EAR SURGERY  2006   Incus transposition;oscillculoplasty; Dr Thereasa Parkin EAR SURGERY  2011   Stirrup resection   SHOULDER ARTHROSCOPY  2004   Dr Mayer Camel   TONSILLECTOMY AND ADENOIDECTOMY     VASECTOMY    :   Current Outpatient Medications:    ALPRAZolam (XANAX) 0.25 MG tablet, Take 0.125 mg at bedtime as needed by mouth for anxiety., Disp: , Rfl:    Ascorbic Acid (VITAMIN C PO), Take by mouth., Disp: , Rfl:    ASPIRIN 81 PO, Take 81 mg by mouth daily., Disp: , Rfl:    atorvastatin (LIPITOR) 20 MG tablet, TAKE ONE TABLET BY MOUTH DAILY, Disp: 90 tablet, Rfl: 3   CALCIUM CITRATE PO, Take by mouth., Disp: , Rfl:    Chlorpheniramine Maleate (ALLERGY RELIEF PO), Take 0.5 tablets by mouth as needed., Disp: , Rfl:    Coenzyme Q10 (CO Q 10 PO), Take by mouth., Disp: , Rfl:    esomeprazole (NEXIUM) 20 MG capsule, Take 1 capsule (20 mg total) by mouth daily., Disp: 90 capsule, Rfl: 2   IBUPROFEN PO, Take by mouth as needed., Disp: , Rfl:    MAGNESIUM CITRATE PO, Take by  mouth., Disp: , Rfl:    Multiple Vitamin (MULTIVITAMIN) tablet, Take 1 tablet by mouth daily., Disp: , Rfl:    Multiple Vitamins-Minerals (ZINC PO), Take by mouth., Disp: , Rfl:    Psyllium (METAMUCIL PO), Take by mouth., Disp: , Rfl:    Pyridoxine HCl (VITAMIN B-6 PO), Take by mouth., Disp: , Rfl:    BIOTIN PO, Take by mouth. (Patient not taking: Reported on 09/21/2021), Disp: , Rfl:    meloxicam (MOBIC) 15 MG tablet, Take 15 mg by mouth as needed. (Patient not taking: Reported on 09/21/2021), Disp: , Rfl: :  No Known Allergies:   Family History  Problem Relation Age of Onset   Diverticulosis Mother    Diabetes Father        AAA   Sudden death Brother 35       CAD; MI @ 39, smoker, sedentary, overweight, diabetes, HLD   Diabetes Brother    Hyperlipidemia Brother    Colon  cancer Maternal Aunt 82   Heart attack Paternal Grandfather 67   Stroke Neg Hx    Neuropathy Neg Hx    Multiple sclerosis Neg Hx    Migraines Neg Hx    Dementia Neg Hx   :   Social History   Socioeconomic History   Marital status: Married    Spouse name: Not on file   Number of children: Not on file   Years of education: Not on file   Highest education level: Not on file  Occupational History   Not on file  Tobacco Use   Smoking status: Never   Smokeless tobacco: Never  Vaping Use   Vaping Use: Never used  Substance and Sexual Activity   Alcohol use: Yes    Alcohol/week: 1.0 - 2.0 standard drink    Types: 1 - 2 Standard drinks or equivalent per week    Comment: social only    Drug use: No   Sexual activity: Not on file  Other Topics Concern   Not on file  Social History Narrative   Peter Novak 2021. Married 2006, divorced 2010. 2 children (35 and 87 in 2016 in good health), no grandkids.    [employer] grad.       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       Hobbies: place in [place] in [place], outdoor activities, reading, follow things on PC      Right handed      Lives at home with wife and dog       Caffeine: 2 cups/day   Social Determinants of Health   Financial Resource Strain: Not on file  Food Insecurity: Not on file  Transportation Needs: Not on file  Physical Activity: Not on file  Stress: Not on file  Social Connections: Not on file  Intimate Partner Violence: Not on file  :  Pertinent items are noted in HPI.  ECOG 0 General appearance: alert and cooperative appeared without distress. Head: atraumatic without any abnormalities. Eyes: conjunctivae/corneas clear. PERRL.  Sclera anicteric. Throat: lips, mucosa, and tongue normal; without oral thrush or ulcers. Resp: clear to auscultation bilaterally without rhonchi, wheezes or dullness to percussion. Cardio: regular rate and rhythm, S1, S2  normal, no murmur, click, rub or gallop GI: soft, non-tender; bowel sounds normal; no masses,  no organomegaly Skin: Skin color, texture, turgor normal. No rashes or lesions Lymph nodes: Cervical, supraclavicular, and axillary nodes normal. Neurologic: Grossly normal without any motor, sensory or  deep tendon reflexes. Musculoskeletal: No joint deformity or effusion.   NM PET (PSMA) SKULL TO MID THIGH  Result Date: 08/27/2021 CLINICAL DATA:  Elevated PSA and recent diagnosis of prostate neoplasm. Most recent PSA of 12.9. EXAM: NUCLEAR MEDICINE PET SKULL BASE TO THIGH TECHNIQUE: 10.0 mCi F18 Piflufolastat (Pylarify) was injected intravenously. Full-ring PET imaging was performed from the skull base to thigh after the radiotracer. CT data was obtained and used for attenuation correction and anatomic localization. COMPARISON:  Prostate MRI of June 26, 2021. FINDINGS: NECK No radiotracer activity in neck lymph nodes. Incidental CT finding: Atrophy of RIGHT submandibular gland due to sialolith measuring 7 mm (image 53/4) no surrounding inflammation. CHEST No radiotracer accumulation within mediastinal or hilar lymph nodes. 6 x 4 mm RIGHT upper lobe pulmonary nodule without radiotracer accumulation. Incidental CT finding: Paste that ABDOMEN/PELVIS Prostate: Marked radiotracer accumulation in the LEFT prostate gland corresponding to the abnormality discovered on the recent MRI of the prostate with a maximum SUV of 9.2. Lymph nodes: No abnormal radiotracer accumulation within pelvic or abdominal nodes. Liver: No evidence of liver metastasis Incidental CT finding: Cholecystectomy. No substantial biliary duct distension. Normal appearance of the pancreas and spleen. Adrenal glands are normal. No acute findings relative to the kidneys. Urinary bladder is collapsed. No acute gastrointestinal findings. Colonic diverticulosis. Aortic atherosclerosis without aneurysmal dilation. SKELETON A sclerotic focus in the  thoracic spine at the T4 level along the LEFT lateral aspect is densely sclerotic and not associated with increased radiotracer accumulation, measuring 17 mm. Similarly dense area in the RIGHT sacrum has a more classic appearance for a bone island measuring 5 mm and without radiotracer accumulation Subtle sclerosis in the RIGHT pubic root (image 221/4) also without radiotracer accumulation no destructive bone findings. IMPRESSION: Large area of radiotracer accumulation in the LEFT prostate corresponding to the lesion seen on MRI compatible with known prostate cancer. No tracer avid areas of metastasis. Sclerotic lesions seen in the spine and the pelvis are favored to represent bone islands despite large size of spinal lesion. Small pulmonary nodule in the RIGHT upper lobe. No follow-up needed if patient is low-risk. Non-contrast chest CT can be considered in 12 months if patient is high-risk. This recommendation follows the consensus statement: Guidelines for Management of Incidental Pulmonary Nodules Detected on CT Images: From the Fleischner Society 2017; Radiology 2017; 284:228-243. Electronically Signed   By: Zetta Bills M.D.   On: 08/27/2021 14:14    Assessment and Plan:   71 year old with prostate cancer diagnosed in December 2022.  He was found to have a Gleason score of 8 and a PSA of 12.9.  PSMA PET scan did not show any evidence of metastatic disease but does have high risk locally advanced disease.   His case was discussed in the prostate cancer multidisciplinary clinic including treatment options.  This included review of his pathology results with the reviewing pathologist as well as reviewing imaging studies with radiology.  Primary surgical therapy versus radiation therapy with androgen deprivation were reviewed at this time.  Complication associated with androgen deprivation therapy were discussed at this time including weight gain, hot flashes and sexual dysfunction among other complaints.   The role for additional systemic therapy including androgen receptor pathway inhibitors, chemotherapy among other options will be deferred at this time.  He understands if he chooses primary surgical therapy that try modality approach may be required with additional salvage radiation option could be considered.  He will learn about all these modalities and make  a decision in near future.  All his questions were answered to his satisfaction.  30  minutes were dedicated to this visit. The time was spent on reviewing pathology results, imaging studies, discussing treatment options, discussing differential diagnosis and answering questions regarding future plan.     A copy of this consult has been forwarded to the requesting physician.

## 2021-09-22 ENCOUNTER — Telehealth: Payer: Self-pay | Admitting: Nutrition

## 2021-09-22 NOTE — Telephone Encounter (Signed)
Scheduled appt per 1/24 referral. Spoke to pt who is aware of appt date and time. Pt is aware to arrive 15 mins prior to appt time.

## 2021-09-23 ENCOUNTER — Other Ambulatory Visit: Payer: Self-pay

## 2021-09-23 ENCOUNTER — Inpatient Hospital Stay: Payer: Medicare PPO | Admitting: Nutrition

## 2021-09-23 NOTE — Progress Notes (Signed)
71 year old male diagnosed with prostate cancer followed by Dr. Alen Blew and Dr. Tammi Klippel.  Patient is going to have surgery in the near future.  Past medical history includes ADD, constipation, hearing loss, hyperlipidemia.  Medications include Xanax, vitamin C, calcium, co-Q10, Nexium, magnesium citrate, multivitamin, zinc, vitamin B6, and Metamucil.  Labs include glucose 119 and hemoglobin A1c 6.0.  Height: 6 feet 2 inches. Weight: 245.6 pounds. BMI: 31.53.  Estimated nutrition needs:  2000-2200 cal, 90-100 g protein, 2.2 L fluid.  Patient is interested in information about healthy diet after surgery.  He also would like to know what foods he should choose or not so he is able to have bowel movements after surgery.  Nutrition diagnosis: Food and nutrition related knowledge deficit related to prostate cancer as evidenced by patient reports of needing diet education.  Intervention: Educated patient to discuss pre op diet with Psychologist, sport and exercise.  Educated on role of fiber and promoting bowel movements. Recommended patient discuss using Ensure pre surgery before his procedure.  Provided information. Reviewed healthy plant-based diet and provided detailed education materials.  Monitoring, evaluation, goals: Patient will tolerate healthy plant-based diet to promote healing after surgery.  No follow-up is scheduled.  Patient has contact information for questions.  **Disclaimer: This note was dictated with voice recognition software. Similar sounding words can inadvertently be transcribed and this note may contain transcription errors which may not have been corrected upon publication of note.**

## 2021-09-23 NOTE — Progress Notes (Signed)
REFERRING PROVIDER: Zola Button, MD [place], [place] [address]  PRIMARY PROVIDER:  Marin Olp, MD  PRIMARY REASON FOR VISIT:  1. Prostate cancer ([place])   2. Family history of colon cancer   3. Family history of breast cancer     HISTORY OF PRESENT ILLNESS:   Peter Novak, a 71 y.o. male, was seen for a [place] cancer genetics consultation during the prostate multidisciplinary clinic at the request of Dr. Alen Blew due to a personal and family history of cancer.  Peter Novak presents to clinic today to discuss the possibility of a hereditary predisposition to cancer, to discuss genetic testing, and to further clarify his future cancer risks, as well as potential cancer risks for family members.   In December 2022, at the age of 3, Peter Novak was diagnosed with high risk prostate cancer. He also has a personal history of melanoma diagnosed in 2011.  CANCER HISTORY:  Oncology History  Prostate cancer ([place])  02/12/2013 Initial Diagnosis   Prostate cancer ([place])   08/03/2021 Cancer Staging   Staging form: Prostate, AJCC 8th Edition - Clinical stage from 08/03/2021: Stage IIC (cT1c, cN0, cM0, PSA: 12.9, Grade Group: 4) - Signed by Freeman Caldron, PA-C on 09/21/2021 Histopathologic type: Adenocarcinoma, NOS Stage prefix: Initial diagnosis Prostate specific antigen (PSA) range: 10 to 19 Gleason primary pattern: 4 Gleason secondary pattern: 4 Gleason score: 8 Histologic grading system: 5 grade system Number of biopsy cores examined: 15 Number of biopsy cores positive: 9 Location of positive needle core biopsies: One side     Past Medical History:  Diagnosis Date   ADD (attention deficit disorder with hyperactivity)    Constipation    Hearing loss    History of skin cancer    Melanoma X 2   Hyperlipidemia     Past Surgical History:  Procedure Laterality Date   CATARACT EXTRACTION, BILATERAL  March 2022 and April 2022   CHOLECYSTECTOMY  2005    COLONOSCOPY  2008   negative   [place]   HERNIA REPAIR  2005   INNER EAR SURGERY  2006   Incus transposition;oscillculoplasty; Dr Thereasa Parkin EAR SURGERY  2011   [place] resection   SHOULDER ARTHROSCOPY  2004   Dr Mayer Camel   TONSILLECTOMY AND ADENOIDECTOMY     VASECTOMY      Social History   Socioeconomic History   Marital status: Married    Spouse name: Not on file   Number of children: Not on file   Years of education: Not on file   Highest education level: Not on file  Occupational History   Not on file  Tobacco Use   Smoking status: Never   Smokeless tobacco: Never  Vaping Use   Vaping Use: Never used  Substance and Sexual Activity   Alcohol use: Yes    Alcohol/week: 1.0 - 2.0 standard drink    Types: 1 - 2 Standard drinks or equivalent per week    Comment: social only    Drug use: No   Sexual activity: Not on file  Other Topics Concern   Not on file  Social History Narrative   Mosie Epstein 2021. Married 2006, divorced 2010. 2 children (35 and 21 in 2016 in good health), no grandkids.    [employer] grad.       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       Hobbies: place in [place]  in [place], outdoor activities, reading, follow things on PC      Right handed      Lives at home with wife and dog       Caffeine: 2 cups/day   Social Determinants of Health   Financial Resource Strain: Not on file  Food Insecurity: Not on file  Transportation Needs: Not on file  Physical Activity: Not on file  Stress: Not on file  Social Connections: Not on file     FAMILY HISTORY:  We obtained a detailed, 4-generation family history.  Significant diagnoses are listed below: Family History  Problem Relation Age of Onset   Cancer Paternal Grandmother 24       unknown type   Colon cancer Maternal Aunt        dx. 77s   Breast cancer Cousin 48     Peter Novak maternal aunt was diagnosed with colon  cancer in her 50s, she is deceased. This aunt's daughter (Peter Novak cousin) was diagnosed with breast cancer at age 98, she is deceased. Peter Novak paternal grandmother was diagnosed with an unknown type of cancer at age 30, she is deceased. Peter Novak is unaware of previous family history of genetic testing for hereditary cancer risks.  GENETIC COUNSELING ASSESSMENT: Peter Novak is a 71 y.o. male with a personal and family history of cancer which is somewhat suggestive of a hereditary predisposition to cancer given his diagnosis of high risk prostate cancer. We, therefore, discussed and recommended the following at today's visit.   DISCUSSION: We discussed that 5 - 10% of cancer is hereditary, with most cases of prostate cancer associated with BRCA1/2 and HOXB13.  There are other genes that can be associated with hereditary prostate cancer syndromes.  We discussed that testing is beneficial for several reasons including knowing how to follow individuals after completing their treatment, identifying whether potential treatment options would be beneficial, and understanding if other family members could be at risk for cancer and allowing them to undergo genetic testing.   We reviewed the characteristics, features and inheritance patterns of hereditary cancer syndromes. We also discussed genetic testing, including the appropriate family members to test, the process of testing, insurance coverage and turn-around-time for results. We discussed the implications of a negative, positive, carrier and/or variant of uncertain significant result.  Based on Peter Novak's personal and family history of cancer, he meets medical criteria for genetic testing. We recommended Peter Novak pursue genetic testing for a panel that includes genes associated with prostate, breast, and colon cancer. However, Peter Novak is not interested in genetic testing at this time.   PLAN: Despite our recommendation, Peter Novak did  not wish to pursue genetic testing at today's visit. We understand this decision and remain available to coordinate genetic testing at any time in the future. Therefore, we recommend Peter Novak continue to follow the cancer screening guidelines given by his primary healthcare provider and oncologist.  Peter Novak questions were answered to his satisfaction today. Our contact information was provided should additional questions or concerns arise. Thank you for the referral and allowing [place] to share in the care of your patient.   Lucille Passy, MS, [facility] Genetic Counselor [email or web address] (P) [phone]  The patient was seen for a total of 20 minutes in face-to-face genetic counseling.  The patient brought his wife. Dr. Alen Blew was available to discuss this case as needed.   _______________________________________________________________________ For Office Staff:  Number of people involved in session: 2 Was an Intern/ student involved  with case: no

## 2021-09-27 ENCOUNTER — Encounter: Payer: Self-pay | Admitting: General Practice

## 2021-09-27 DIAGNOSIS — C61 Malignant neoplasm of prostate: Secondary | ICD-10-CM | POA: Diagnosis not present

## 2021-09-27 DIAGNOSIS — M6281 Muscle weakness (generalized): Secondary | ICD-10-CM | POA: Diagnosis not present

## 2021-09-27 DIAGNOSIS — M62838 Other muscle spasm: Secondary | ICD-10-CM | POA: Diagnosis not present

## 2021-09-27 DIAGNOSIS — N393 Stress incontinence (female) (male): Secondary | ICD-10-CM | POA: Diagnosis not present

## 2021-09-27 NOTE — Progress Notes (Signed)
[place] Psychosocial Distress Screening [place]") briefly by phone followingProstate Multidisciplinary Clinic to introduce [place] team/resources, reviewing distress screen per protocol.  The patient scored a 10 on the Psychosocial Distress Thermometer which indicates severe distress.   ONCBCN DISTRESS SCREENING 09/27/2021  Screening Type Initial Screening  Distress experienced in past week (1-10) 10  Emotional problem type Nervousness/Anxiety;Adjusting to illness;Boredom  Information Concerns Type Lack of info about maintaining fitness  Referral to support programs Yes    Follow up needed: Yes.  Will phone again today/tomorrow to catch Mr Meggett at a more convenient time.   [place], [place], [facility] Pager [phone] Voicemail [phone]

## 2021-10-04 ENCOUNTER — Encounter (HOSPITAL_COMMUNITY)
Admission: RE | Admit: 2021-10-04 | Discharge: 2021-10-04 | Disposition: A | Discharge status: Home / Self Care | Payer: Medicare PPO | Source: Ambulatory Visit | Attending: Family Medicine | Admitting: Family Medicine

## 2021-10-07 DIAGNOSIS — N393 Stress incontinence (female) (male): Secondary | ICD-10-CM | POA: Diagnosis not present

## 2021-10-07 DIAGNOSIS — M62838 Other muscle spasm: Secondary | ICD-10-CM | POA: Diagnosis not present

## 2021-10-07 DIAGNOSIS — M6281 Muscle weakness (generalized): Secondary | ICD-10-CM | POA: Diagnosis not present

## 2021-10-08 ENCOUNTER — Other Ambulatory Visit: Payer: Self-pay | Admitting: Urology

## 2021-10-08 NOTE — Progress Notes (Signed)
RN and support staff have reached out to [facility] to assess status of surgery date several times.   Surgery date still pending.  Patient voiced concern with not being scheduled yet, and inquiring about follow up with date.  RN explained process on surgery scheduling, and reinforced that it has been confirmed surgery date is being processed.  RN provided contact information to [facility] as well for any further surgery concerns.

## 2021-10-11 ENCOUNTER — Encounter: Payer: Self-pay | Admitting: General Practice

## 2021-10-11 NOTE — Progress Notes (Signed)
[facility] Spiritual Care Note  Reached Peter Novak by phone for follow-up pastoral check-in. He is coping with his distress regarding wait for surgery with prayer/faith, meaningful distraction (especially walks), and connecting with friends who have had similar procedures. Provided empathic listening, normalization of feelings, and reminder of support resources (Prostate Cancer Support Group, Spiritual Care, [facility] Wellness/[facility] programming). Peter Novak is aware of ongoing chaplain availability, should needs arise or circumstances change.   [place], [place], [facility] Pager [phone] Voicemail [phone]

## 2021-10-18 NOTE — Progress Notes (Addendum)
Anesthesia Review:  PCP: Dr Garret Reddish- [place] 08/12/21  Cardiologist : none  Chest x-ray : EKG : Echo : Stress test: Cardiac Cath :  Activity level: can do a flight of stairs without difficulty  Sleep Study/ CPAP : none  Fasting Blood Sugar :      / Checks Blood Sugar -- times a day:   Blood Thinner/ Instructions /Last Dose: ASA / Instructions/ Last Dose :   81 mg aspirin  Cvoid test on 11/01/21 at 0900am  PT states at preop" My sugar they say has been elevated at times with labs".  Pt denies diabetes or prediabetes at preop.[name] Kitchen  HGBA!C done at preop Hgba1c-10/20/21-  Pulse 47 at preop.  PT voices no complaints  Last ekg in 2018 in epic pulse was 49.  PT on no meds. Pulse 58 at office visit of DR Garret Reddish in 05/2021.

## 2021-10-18 NOTE — Progress Notes (Signed)
Covid test on 11/01/21.   Come thru main entrance at [facility] long have a seat in the lobby on the right as you come thru the door.  Call [phone] and let them know you are here for covid testing.                 Peter Novak  10/18/2021   Your procedure is scheduled on:   11/04/21   Report to [facility] Main  Entrance   Report to admitting at   [phone]     Call this number if you have problems the morning of surgery [phone]    Remember: Do not eat food , candy gum or mints :After Midnight. You may have clear liquids from midnight until __  0415am  Clear liquid diet the day before surgery.   Mix 17 g of Miralax with 4 ounces of water and eirnk at 12 noon day before surgery.   Fleets enema nite befofe surgery    CLEAR LIQUID DIET   Foods Allowed                                                                       Coffee and tea, regular and decaf                              Plain Jell-O any favor except red or purple                                            Fruit ices (not with fruit pulp)                                      Iced Popsicles                                     Carbonated beverages, regular and diet                                    Cranberry, grape and apple juices Sports drinks like Gatorade Lightly seasoned clear broth or consume(fat free) Sugar   _____________________________________________________________________    BRUSH YOUR TEETH MORNING OF SURGERY AND RINSE YOUR MOUTH OUT, NO CHEWING GUM CANDY OR MINTS.     Take these medicines the morning of surgery with A SIP OF WATER:  Nexium   DO NOT TAKE ANY DIABETIC MEDICATIONS DAY OF YOUR SURGERY                               You may not have any metal on your body including hair pins and              piercings  Do not wear jewelry, make-up, lotions, powders or perfumes, deodorant  Do not wear nail polish on your fingernails.  Do not shave  48 hours prior to surgery.               Men may shave face and neck.   Do not bring valuables to the hospital. [place].  Contacts, dentures or bridgework may not be worn into surgery.  Leave suitcase in the car. After surgery it may be brought to your room.     Patients discharged the day of surgery will not be allowed to drive home. IF YOU ARE HAVING SURGERY AND GOING HOME THE SAME DAY, YOU MUST HAVE AN ADULT TO DRIVE YOU HOME AND BE WITH YOU FOR 24 HOURS. YOU MAY GO HOME BY TAXI OR UBER OR ORTHERWISE, BUT AN ADULT MUST ACCOMPANY YOU HOME AND STAY WITH YOU FOR 24 HOURS.  Name and phone number of your driver:  Special Instructions: N/A              Please read over the following fact sheets you were given: _____________________________________________________________________  [facility] - Preparing for Surgery Before surgery, you can play an important role.  Because skin is not sterile, your skin needs to be as free of germs as possible.  You can reduce the number of germs on your skin by washing with CHG (chlorahexidine gluconate) soap before surgery.  CHG is an antiseptic cleaner which kills germs and bonds with the skin to continue killing germs even after washing. Please DO NOT use if you have an allergy to CHG or antibacterial soaps.  If your skin becomes reddened/irritated stop using the CHG and inform your nurse when you arrive at Short Stay. Do not shave (including legs and underarms) for at least 48 hours prior to the first CHG shower.  You may shave your face/neck. Please follow these instructions carefully:  1.  Shower with CHG Soap the night before surgery and the  morning of Surgery.  2.  If you choose to wash your hair, wash your hair first as usual with your  normal  shampoo.  3.  After you shampoo, rinse your hair and body thoroughly to remove the  shampoo.                           4.  Use CHG as you would any other liquid soap.  You can apply chg directly   to the skin and wash                       Gently with a scrungie or clean washcloth.  5.  Apply the CHG Soap to your body ONLY FROM THE NECK DOWN.   Do not use on face/ open                           Wound or open sores. Avoid contact with eyes, ears mouth and genitals (private parts).                       Wash face,  Genitals (private parts) with your normal soap.             6.  Wash thoroughly, paying special attention to the area where your surgery  will be performed.  7.  Thoroughly rinse your body  with warm water from the neck down.  8.  DO NOT shower/wash with your normal soap after using and rinsing off  the CHG Soap.                9.  Pat yourself dry with a clean towel.            10.  Wear clean pajamas.            11.  Place clean sheets on your bed the night of your first shower and do not  sleep with pets. Day of Surgery : Do not apply any lotions/deodorants the morning of surgery.  Please wear clean clothes to the hospital/surgery center.  FAILURE TO FOLLOW THESE INSTRUCTIONS MAY RESULT IN THE CANCELLATION OF YOUR SURGERY PATIENT SIGNATURE_________________________________  NURSE SIGNATURE__________________________________  ________________________________________________________________________

## 2021-10-19 ENCOUNTER — Other Ambulatory Visit: Payer: Self-pay | Admitting: Family Medicine

## 2021-10-20 ENCOUNTER — Other Ambulatory Visit: Payer: Self-pay

## 2021-10-20 ENCOUNTER — Encounter (HOSPITAL_COMMUNITY)
Admission: RE | Admit: 2021-10-20 | Discharge: 2021-10-20 | Disposition: A | Discharge status: Home / Self Care | Payer: Medicare PPO | Source: Ambulatory Visit | Attending: Urology | Admitting: Urology

## 2021-10-20 ENCOUNTER — Encounter (HOSPITAL_COMMUNITY): Payer: Self-pay

## 2021-10-20 VITALS — BP 152/84 | HR 47 | Temp 97.8°F | Resp 18 | Ht 74.0 in | Wt 239.0 lb

## 2021-10-20 DIAGNOSIS — Z01812 Encounter for preprocedural laboratory examination: Secondary | ICD-10-CM | POA: Diagnosis not present

## 2021-10-20 DIAGNOSIS — Z01818 Encounter for other preprocedural examination: Secondary | ICD-10-CM

## 2021-10-20 HISTORY — DX: Unspecified osteoarthritis, unspecified site: M19.90

## 2021-10-20 HISTORY — DX: Other complications of anesthesia, initial encounter: T88.59XA

## 2021-10-20 HISTORY — DX: Gastro-esophageal reflux disease without esophagitis: K21.9

## 2021-10-20 HISTORY — DX: Malignant (primary) neoplasm, unspecified: C80.1

## 2021-10-20 LAB — BASIC METABOLIC PANEL
Anion gap: 6 (ref 5–15)
BUN: 16 mg/dL (ref 8–23)
CO2: 26 mmol/L (ref 22–32)
Calcium: 9 mg/dL (ref 8.9–10.3)
Chloride: 106 mmol/L (ref 98–111)
Creatinine, Ser: 0.93 mg/dL (ref 0.61–1.24)
GFR, Estimated: >60 mL/min (ref >60)
Glucose, Bld: 116 mg/dL — ABNORMAL HIGH (ref 70–99)
Potassium: 4.3 mmol/L (ref 3.5–5.1)
Sodium: 138 mmol/L (ref 135–145)

## 2021-10-20 LAB — CBC
HCT: 40.6 % (ref 39.0–52.0)
Hemoglobin: 13.7 g/dL (ref 13.0–17.0)
MCH: 30.1 pg (ref 26.0–34.0)
MCHC: 33.7 g/dL (ref 30.0–36.0)
MCV: 89.2 fL (ref 80.0–100.0)
Platelets: 226 10*3/uL (ref 150–400)
RBC: 4.55 MIL/uL (ref 4.22–5.81)
RDW: 13.4 % (ref 11.5–15.5)
WBC: 4.7 10*3/uL (ref 4.0–10.5)
nRBC: 0 % (ref 0.0–0.2)

## 2021-10-21 DIAGNOSIS — N393 Stress incontinence (female) (male): Secondary | ICD-10-CM | POA: Diagnosis not present

## 2021-10-21 DIAGNOSIS — M62838 Other muscle spasm: Secondary | ICD-10-CM | POA: Diagnosis not present

## 2021-10-21 DIAGNOSIS — M6281 Muscle weakness (generalized): Secondary | ICD-10-CM | POA: Diagnosis not present

## 2021-10-21 LAB — HEMOGLOBIN A1C
Hgb A1c MFr Bld: 6.1 % — ABNORMAL HIGH (ref 4.8–5.6)
Mean Plasma Glucose: 128 mg/dL

## 2021-11-01 ENCOUNTER — Encounter (HOSPITAL_COMMUNITY)
Admission: RE | Admit: 2021-11-01 | Discharge: 2021-11-01 | Disposition: A | Discharge status: Home / Self Care | Payer: Medicare PPO | Source: Ambulatory Visit | Attending: Urology | Admitting: Urology

## 2021-11-01 ENCOUNTER — Other Ambulatory Visit: Payer: Self-pay

## 2021-11-01 DIAGNOSIS — Z20822 Contact with and (suspected) exposure to covid-19: Secondary | ICD-10-CM | POA: Insufficient documentation

## 2021-11-01 DIAGNOSIS — Z01812 Encounter for preprocedural laboratory examination: Secondary | ICD-10-CM | POA: Diagnosis not present

## 2021-11-01 LAB — SARS CORONAVIRUS 2 (TAT 6-24 HRS): SARS Coronavirus 2: NEGATIVE

## 2021-11-02 ENCOUNTER — Other Ambulatory Visit: Payer: Self-pay

## 2021-11-02 DIAGNOSIS — C61 Malignant neoplasm of prostate: Secondary | ICD-10-CM

## 2021-11-03 NOTE — Anesthesia Preprocedure Evaluation (Addendum)
Anesthesia Evaluation  ?Patient identified by MRN, date of birth, ID band ?Patient awake ? ? ? ?Reviewed: ?Allergy & Precautions, NPO status , Patient's Chart, lab work & pertinent test results ? ?Airway ?Mallampati: II ? ?TM Distance: >3 FB ?Neck ROM: Full ? ? ? Dental ?no notable dental hx. ?(+) Teeth Intact, Dental Advisory Given ?  ?Pulmonary ? ?  ?Pulmonary exam normal ?breath sounds clear to auscultation ? ? ? ? ? ? Cardiovascular ?Exercise Tolerance: Good ?Normal cardiovascular exam ?Rhythm:Regular Rate:Normal ? ?Echo 2018  ?The estimated ejection  ???fraction was in the range of 60% to 65% ?  ?Neuro/Psych ? Headaches,   ? GI/Hepatic ?Neg liver ROS, GERD  ,  ?Endo/Other  ?negative endocrine ROS ? Renal/GU ?Lab Results ?     Component                Value               Date                 ?     CREATININE               0.93                10/20/2021           ?     K                        4.3                 10/20/2021           ?       ? ?Prostate CA ? ?  ?Musculoskeletal ? ?(+) Arthritis ,  ? Abdominal ?(+) + obese (BMI 30.7),   ?Peds ? Hematology ?Lab Results ?     Component                Value               Date                     ?     HGB                      13.7                10/20/2021           ?     HCT                      40.6                10/20/2021           ?     PLT                      226                 10/20/2021           ?   ?Anesthesia Other Findings ?NKDA ? Reproductive/Obstetrics ? ?  ? ? ? ? ? ? ? ? ? ? ? ? ? ?  ?  ? ? ? ? ? ? ? ?Anesthesia Physical ?Anesthesia Plan ? ?ASA: 3 ? ?Anesthesia Plan: General  ? ?Post-op Pain Management: Lidocaine infusion* and Ketamine IV*  ? ?Induction: Intravenous ? ?  PONV Risk Score and Plan: 3 and Midazolam, Ondansetron and Treatment may vary due to age or medical condition ? ?Airway Management Planned: Oral ETT ? ?Additional Equipment:  ? ?Intra-op Plan:  ? ?Post-operative Plan: Extubation in OR ? ?Informed  Consent:  ? ? ? ?Dental advisory given ? ?Plan Discussed with:  ? ?Anesthesia Plan Comments: (GA w 2 IVs)  ? ? ? ? ? ? ?Anesthesia Quick Evaluation ? ?

## 2021-11-03 NOTE — H&P (Signed)
Office Visit Report     11/02/2021   --------------------------------------------------------------------------------   Peter Novak  MRN: [id number]  DOB: 1950-10-05, 71 year old Male  SSN: [id number]   PRIMARY CARE:  Peter Mars. Melanee Spry, MD  REFERRING:  Peter Hughs, MD  PROVIDER:  Louis Novak, M.D.  TREATING:  Peter Novak, [place]  LOCATION:  [facility] [phone]     --------------------------------------------------------------------------------   CC/HPI: Pt presents today for pre-operative history and physical exam in anticipation of robotic assisted lap radical prostatectomy with bilateral pelvic lymph node dissection by Dr. Alinda Novak on 11/04/21. He is doing well and is without complaint.   Pt denies F/C, HA, CP, SOB, N/V, diarrhea/constipation, back pain, flank pain, hematuria, and dysuria.    HX:   CC: Prostate Cancer   Physician requesting consult: Peter Novak  PCP: Peter Novak  Location of consult: [facility] - Pro[facility]   Peter Novak is a 71 year old gentleman with a history of GERD and hyperlipidemia who was initially diagnosed with very low risk prostate cancer by Peter Novak in March 2016. At the time of diagnosis, his PSA was 8.69 and he was found to have 10% of 1 core out of 12 that was positive for Gleason 3+3=6 adenocarcinoma. He was lost to follow up after his visit in the summer of 2020 at which time his PSA was stable. He then established care with Dr. Louis Novak this year. His PSA was 10.56 and was noted to have a firm left prostate noudle and an MRI of the prostate was performed that demonstrated a 1.4 cm PI-RADS 4 lesion at the left mid gland. He underwent a MR/US TRUS biopsy of the prostate on 08/03/21 that demonstrated upgraded Gleason 4+4=8 adenocarcinoma with all 3 targeted biopsies positive and 6 out of 12 systematic biopsies (all on the left) positive. A PSMA  PET scan was performed on 08/26/21 and confirmed uptake on the left side of the prostate with sclerotic lesions of the spine an pelvis but with no uptake on PSMA PET imaging suggesting a benign etiology. There was also a small pulmonary nodule incidentally noted in the right upper lobe.   Family history: None.   Imaging studies: PSMA PET imaging - no metastatic disease. His MRI was also reviewed and did not reveal definitive extraprostatic extension but did demonstrate a bulging capsule that along with his pathology does suggest a strong likelihood for microscopic locally advanced disease.   PMH: He has a history of GERD, hyperlipidemia, and HSV-2.  PSH: Laparoscopic inguinal hernia repair, laparoscopic cholecystectomy.   TNM stage: cT2a N0 M0  PSA: 10.56  Gleason score: 4+4=8 (GG 4)  Biopsy (08/03/21): 9/15 cores positive  Left: L lateral apex (70%, 4+3=7, PNI), L apex (5%, 3+4=7), L lateral mid (90%, 4+4=8, EPE, PNI), L mid (90%, 4+4=8), L lateral base (90%, 4+3=7, PNI), L base (60%, 4+4=8)  Right: Benign  MR target: 3/3 cores positive (2 cores with 4+4=8 in 90% and 90% and PNI and 1 core with 4+3=7 in 70%)  Prostate volume: 30.0 cc   Nomogram  OC disease: 25%  EPE: 74%  SVI: 22%  LNI: 22%  PFS (5 year, 10 year): 41%, 27%   Urinary function: IPSS is 4.  Erectile function: SHIM score is 19. Erectile function sexual activity are not the primary priority for him although he would be happy to preserve this function if possible.  ALLERGIES: No Allergies    MEDICATIONS: Lipitor 20 mg tablet  Sildenafil Citrate 100 mg tablet 1 tablet PO Daily PRN  Adderall 10 MG Oral Tablet Oral  Aleve CAPS Oral  ALPRAZolam 0.25 MG Oral Tablet Oral  Aspirin 81 MG TABS Oral  Omeprazole 20 MG Oral Capsule Delayed Release Oral     Notes: Xanex 0.5 mg qhs    GU PSH: Prostate Needle Biopsy - 08/03/2021       PSH Notes: Hernia Repair, Ear Surgery, Gallbladder Surgery, Shoulder Surgery   right  elbow surgery   NON-GU PSH: Hernia Repair - 2014 Surgical Pathology, Gross And Microscopic Examination For Prostate Needle - 08/03/2021     GU PMH: Stress Incontinence - 10/21/2021, - 10/07/2021, - 09/27/2021 Prostate Cancer - 09/27/2021, - 09/21/2021, - 09/08/2021, - 08/03/2021, - 07/20/2021, - 06/08/2021 (Stable), I told him I am going to follow his PSA very closely with a repeat again in 3 months and then again in 6 months with DRE and office visit., - 2020 (Stable), His prostate remains benign to exam with no new nodularity or induration. His PSA continues to remain relatively stable at 7.9 to inches lower than it was when he underwent his biopsy. I told him that in the future I would consider imaging the prostate with an MRI scan. He will then also need to undergo a repeat biopsy. For now I will continue to monitor his PSA with a repeat DRE and PSA in 6 months., - 2019 (Stable), His PSA remains stable at 7.64 and his DRE reveals no new nodularity or induration. I have recommended continued active surveillance with repeat DRE and PSA again in 6 months., - 2018 (Stable), His prostate was noted to be smooth and benign today. He has not been having his PSA checked and one will be obtained today and I will contact him with the results. As long as that looks good I will plan to see him back again in 6 months for repeat DRE and PSA., - 2018, Adenocarcinoma of prostate, - 2016 ED due to arterial insufficiency - 09/08/2021, - 06/08/2021, Erectile dysfunction due to arterial insufficiency, - 2016 BPH w/o LUTS (Stable), He does have some slight enlargement of his prostate but has no voiding symptoms. - 2018 Dysuria, Dysuria - 2016      PMH Notes:   1) Prostate cancer: He is s/p a UNS RAL radical prostatectomy and BPLND on 11/04/21.   Diagnosis:  Pretreatment PSA: 10.56  Pretreatment SHIM score: 19    ADD      NON-GU PMH: Muscle weakness (generalized) - 10/21/2021, - 10/07/2021, - 09/27/2021 Other muscle spasm  - 10/21/2021, - 10/07/2021, - 09/27/2021 Encounter for general adult medical examination without abnormal findings, Encounter for preventive health examination - 2016 Herpesviral infection of urogenital system, unspecified, Herpes, genital - 2014 Other abnormal glucose, Abnormal Glucose - 2014 Personal history of other diseases of the digestive system, History of esophageal reflux - 2014 Personal history of other endocrine, nutritional and metabolic disease, History of hyperlipidemia - 2014    FAMILY HISTORY: Acute Myocardial Infarction - Brother, Grandfather nephrolithiasis - Runs In Family renal failure - Runs In Family   SOCIAL HISTORY: Marital Status: Married Preferred Language: English; Ethnicity: Not Hispanic Or Latino; Race: White Current Smoking Status: Patient has never smoked.   Tobacco Use Assessment Completed: Used Tobacco in last 30 days? Does not use smokeless tobacco. Social Drinker.  Does not use drugs. Drinks 1 caffeinated drink per day. Has not had a  blood transfusion.     Notes: Alcohol Use, Marital History - Divorced, Never A Smoker, Caffeine Use  ETOH 1-2 drinks per week    REVIEW OF SYSTEMS:    GU Review Male:   Patient denies frequent urination, hard to postpone urination, burning/ pain with urination, get up at night to urinate, leakage of urine, stream starts and stops, trouble starting your stream, have to strain to urinate , erection problems, and penile pain.  Gastrointestinal (Upper):   Patient denies nausea, vomiting, and indigestion/ heartburn.  Gastrointestinal (Lower):   Patient reports constipation. Patient denies diarrhea.  Constitutional:   Patient denies fever, night sweats, weight loss, and fatigue.  Skin:   Patient denies skin rash/ lesion and itching.  Eyes:   Patient denies double vision and blurred vision.  Ears/ Nose/ Throat:   Patient denies sore throat and sinus problems.  Hematologic/Lymphatic:   Patient denies swollen glands and easy  bruising.  Cardiovascular:   Patient denies leg swelling and chest pains.  Respiratory:   Patient denies cough and shortness of breath.  Endocrine:   Patient denies excessive thirst.  Musculoskeletal:   Patient denies back pain and joint pain.  Neurological:   Patient denies headaches and dizziness.  Psychologic:   Patient denies depression and anxiety.   VITAL SIGNS:      11/02/2021 03:35 PM  Weight 239 lb / 108.41 kg  Height 74 in / 187.96 cm  BP 141/98 mmHg  Pulse 64 /min  Temperature 98.0 F / 36.6 C  BMI 30.7 kg/m   MULTI-SYSTEM PHYSICAL EXAMINATION:    Constitutional: Well-nourished. No physical deformities. Normally developed. Good grooming.  Neck: Neck symmetrical, not swollen. Normal tracheal position.  Respiratory: Normal breath sounds. No labored breathing, no use of accessory muscles.   Cardiovascular: Regular rate and rhythm. No murmur, no gallop.   Lymphatic: No enlargement of neck, axillae, groin.  Skin: No paleness, no jaundice, no cyanosis. No lesion, no ulcer, no rash.  Neurologic / Psychiatric: Oriented to time, oriented to place, oriented to person. No depression, no anxiety, no agitation.  Gastrointestinal: No mass, no tenderness, no rigidity, non obese abdomen.  Eyes: Normal conjunctivae. Normal eyelids.  Ears, Nose, Mouth, and Throat: Left ear no scars, no lesions, no masses. Right ear no scars, no lesions, no masses. Nose no scars, no lesions, no masses. Normal hearing. Normal lips.  Musculoskeletal: Normal gait and station of head and neck.     Complexity of Data:  Records Review:   Previous Patient Records  Urine Test Review:   Urinalysis   11/02/21  Urinalysis  Urine Appearance Clear   Urine Color Yellow   Urine Glucose Neg mg/dL  Urine Bilirubin Neg mg/dL  Urine Ketones Neg mg/dL  Urine Specific Gravity 1.015   Urine Blood Neg ery/uL  Urine pH <=5.0   Urine Protein Neg mg/dL  Urine Urobilinogen 0.2 mg/dL  Urine Nitrites Neg   Urine Leukocyte  Esterase Neg leu/uL   PROCEDURES:          Urinalysis - 81003 Dipstick Dipstick Cont'd  Color: Yellow Bilirubin: Neg mg/dL  Appearance: Clear Ketones: Neg mg/dL  Specific Gravity: 1.015 Blood: Neg ery/uL  pH: <=5.0 Protein: Neg mg/dL  Glucose: Neg mg/dL Urobilinogen: 0.2 mg/dL    Nitrites: Neg    Leukocyte Esterase: Neg leu/uL    ASSESSMENT:      ICD-10 Details  1 GU:   Prostate Cancer - C61    PLAN:  Schedule Return Visit/Planned Activity: Keep Scheduled Appointment - Schedule Surgery          Document Letter(s):  Created for Patient: Clinical Summary         Notes:   There are no changes in the patients history or physical exam since last evaluation by Dr. Alinda Novak. Pt is scheduled to undergo RALP with BPLND on 11/04/21.   All pt's questions were answered to the best of my ability.          Next Appointment:      Next Appointment: 11/04/2021 07:15 AM    Appointment Type: Surgery     Location: [facility] [phone]    Provider: Raynelle Bring, M.D.    Reason for Visit: WL/EXT REC RA LAP RAD PROSTATECTOMY LEVEL 3, BPLA WITH AMANDA      * Signed by Mcarthur Rossetti, PA on 11/02/21 at 4:02 PM (EST)*

## 2021-11-04 ENCOUNTER — Encounter (HOSPITAL_COMMUNITY)
Admission: RE | Disposition: A | Discharge status: Home / Self Care | Payer: Self-pay | Source: Ambulatory Visit | Attending: Urology

## 2021-11-04 ENCOUNTER — Other Ambulatory Visit: Payer: Self-pay

## 2021-11-04 ENCOUNTER — Observation Stay (HOSPITAL_COMMUNITY)
Admission: RE | Admit: 2021-11-04 | Discharge: 2021-11-05 | Disposition: A | Discharge status: Home / Self Care | Payer: Medicare PPO | Source: Ambulatory Visit | Attending: Urology | Admitting: Urology

## 2021-11-04 ENCOUNTER — Encounter (HOSPITAL_COMMUNITY): Payer: Self-pay | Admitting: Urology

## 2021-11-04 ENCOUNTER — Ambulatory Visit (HOSPITAL_BASED_OUTPATIENT_CLINIC_OR_DEPARTMENT_OTHER): Payer: Medicare PPO | Admitting: Anesthesiology

## 2021-11-04 ENCOUNTER — Ambulatory Visit (HOSPITAL_COMMUNITY): Payer: Medicare PPO | Admitting: Physician Assistant

## 2021-11-04 DIAGNOSIS — K219 Gastro-esophageal reflux disease without esophagitis: Secondary | ICD-10-CM | POA: Insufficient documentation

## 2021-11-04 DIAGNOSIS — C61 Malignant neoplasm of prostate: Secondary | ICD-10-CM | POA: Diagnosis not present

## 2021-11-04 DIAGNOSIS — Z6830 Body mass index (BMI) 30.0-30.9, adult: Secondary | ICD-10-CM | POA: Insufficient documentation

## 2021-11-04 DIAGNOSIS — M6281 Muscle weakness (generalized): Secondary | ICD-10-CM | POA: Insufficient documentation

## 2021-11-04 DIAGNOSIS — R911 Solitary pulmonary nodule: Secondary | ICD-10-CM | POA: Insufficient documentation

## 2021-11-04 DIAGNOSIS — R519 Headache, unspecified: Secondary | ICD-10-CM | POA: Diagnosis not present

## 2021-11-04 DIAGNOSIS — A6002 Herpesviral infection of other male genital organs: Secondary | ICD-10-CM | POA: Diagnosis not present

## 2021-11-04 DIAGNOSIS — M1990 Unspecified osteoarthritis, unspecified site: Secondary | ICD-10-CM | POA: Diagnosis not present

## 2021-11-04 DIAGNOSIS — M62838 Other muscle spasm: Secondary | ICD-10-CM | POA: Insufficient documentation

## 2021-11-04 DIAGNOSIS — Z7982 Long term (current) use of aspirin: Secondary | ICD-10-CM | POA: Diagnosis not present

## 2021-11-04 DIAGNOSIS — E669 Obesity, unspecified: Secondary | ICD-10-CM | POA: Diagnosis not present

## 2021-11-04 DIAGNOSIS — E785 Hyperlipidemia, unspecified: Secondary | ICD-10-CM | POA: Diagnosis not present

## 2021-11-04 DIAGNOSIS — R7309 Other abnormal glucose: Secondary | ICD-10-CM | POA: Diagnosis not present

## 2021-11-04 DIAGNOSIS — K5900 Constipation, unspecified: Secondary | ICD-10-CM | POA: Diagnosis not present

## 2021-11-04 DIAGNOSIS — Z8719 Personal history of other diseases of the digestive system: Secondary | ICD-10-CM | POA: Diagnosis not present

## 2021-11-04 HISTORY — PX: LYMPHADENECTOMY: SHX5960

## 2021-11-04 HISTORY — PX: ROBOT ASSISTED LAPAROSCOPIC RADICAL PROSTATECTOMY: SHX5141

## 2021-11-04 LAB — TYPE AND SCREEN
ABO/RH(D): B POS
Antibody Screen: NEGATIVE

## 2021-11-04 LAB — HEMOGLOBIN AND HEMATOCRIT, BLOOD
HCT: 41.9 % (ref 39.0–52.0)
Hemoglobin: 14 g/dL (ref 13.0–17.0)

## 2021-11-04 LAB — ABO/RH: ABO/RH(D): B POS

## 2021-11-04 SURGERY — XI ROBOTIC ASSISTED LAPAROSCOPIC RADICAL PROSTATECTOMY LEVEL 3
Anesthesia: General | Site: Prostate

## 2021-11-04 MED ORDER — DEXAMETHASONE SODIUM PHOSPHATE 10 MG/ML IJ SOLN
INTRAMUSCULAR | Status: DC | PRN
  Administered 2021-11-04: 10 mg via INTRAVENOUS

## 2021-11-04 MED ORDER — LIDOCAINE HCL (CARDIAC) PF 100 MG/5ML IV SOSY
PREFILLED_SYRINGE | INTRAVENOUS | Status: DC | PRN
  Administered 2021-11-04: 100 mg via INTRAVENOUS

## 2021-11-04 MED ORDER — PROPOFOL 10 MG/ML IV BOLUS
INTRAVENOUS | Status: DC | PRN
  Administered 2021-11-04: 180 mg via INTRAVENOUS

## 2021-11-04 MED ORDER — SODIUM CHLORIDE 0.9 % IV BOLUS: 1000.0000 mL | Freq: Once | INTRAVENOUS | Status: DC

## 2021-11-04 MED ORDER — OXYCODONE HCL 5 MG PO TABS: 5.0000 mg | ORAL_TABLET | Freq: Once | ORAL | Status: DC | PRN

## 2021-11-04 MED ORDER — PANTOPRAZOLE SODIUM 40 MG PO TBEC
40.0000 mg | DELAYED_RELEASE_TABLET | Freq: Every day | ORAL | Status: DC
  Administered 2021-11-05: 40 mg via ORAL
  Filled 2021-11-04: qty 1

## 2021-11-04 MED ORDER — ATORVASTATIN CALCIUM 20 MG PO TABS
20.0000 mg | ORAL_TABLET | Freq: Every day | ORAL | Status: DC
  Administered 2021-11-04 – 2021-11-05 (×2): 20 mg via ORAL
  Filled 2021-11-04 (×2): qty 1

## 2021-11-04 MED ORDER — KETOROLAC TROMETHAMINE 15 MG/ML IJ SOLN
INTRAMUSCULAR | Status: AC
  Administered 2021-11-04: 10:00:00 15 mg
  Filled 2021-11-04: qty 1

## 2021-11-04 MED ORDER — ROCURONIUM BROMIDE 10 MG/ML (PF) SYRINGE
PREFILLED_SYRINGE | INTRAVENOUS | Status: DC | PRN
Start: 2021-11-04 — End: 2021-11-04
  Administered 2021-11-04: 80 mg via INTRAVENOUS
  Administered 2021-11-04: 20 mg via INTRAVENOUS

## 2021-11-04 MED ORDER — ZOLPIDEM TARTRATE 5 MG PO TABS
5.0000 mg | ORAL_TABLET | Freq: Every evening | ORAL | Status: DC | PRN
Start: 2021-11-04 — End: 2021-11-05

## 2021-11-04 MED ORDER — DIPHENHYDRAMINE HCL 50 MG/ML IJ SOLN: 12.5000 mg | Freq: Four times a day (QID) | INTRAMUSCULAR | Status: DC | PRN

## 2021-11-04 MED ORDER — FENTANYL CITRATE PF 50 MCG/ML IJ SOSY
PREFILLED_SYRINGE | INTRAMUSCULAR | Status: AC
  Filled 2021-11-04: qty 3

## 2021-11-04 MED ORDER — CEFAZOLIN SODIUM-DEXTROSE 2-4 GM/100ML-% IV SOLN
2.0000 g | INTRAVENOUS | Status: AC
  Administered 2021-11-04: 07:00:00 2 g via INTRAVENOUS
  Filled 2021-11-04: qty 100

## 2021-11-04 MED ORDER — FENTANYL CITRATE PF 50 MCG/ML IJ SOSY
25.0000 ug | PREFILLED_SYRINGE | INTRAMUSCULAR | Status: DC | PRN
  Administered 2021-11-04 (×3): 50 ug via INTRAVENOUS

## 2021-11-04 MED ORDER — MORPHINE SULFATE (PF) 2 MG/ML IV SOLN: 2.0000 mg | INTRAVENOUS | Status: DC | PRN

## 2021-11-04 MED ORDER — CEFAZOLIN SODIUM-DEXTROSE 1-4 GM/50ML-% IV SOLN
1.0000 g | Freq: Three times a day (TID) | INTRAVENOUS | Status: AC
  Administered 2021-11-04 (×2): 1 g via INTRAVENOUS
  Filled 2021-11-04 (×2): qty 50

## 2021-11-04 MED ORDER — KETAMINE HCL 50 MG/5ML IJ SOSY
PREFILLED_SYRINGE | INTRAMUSCULAR | Status: AC
  Filled 2021-11-04: qty 5

## 2021-11-04 MED ORDER — MIDAZOLAM HCL 2 MG/2ML IJ SOLN
INTRAMUSCULAR | Status: AC
  Filled 2021-11-04: qty 2

## 2021-11-04 MED ORDER — OXYCODONE HCL 5 MG/5ML PO SOLN: 5.0000 mg | Freq: Once | ORAL | Status: DC | PRN

## 2021-11-04 MED ORDER — LIDOCAINE HCL (PF) 2 % IJ SOLN
INTRAMUSCULAR | Status: DC | PRN
  Administered 2021-11-04: 1.5 mg/kg/h

## 2021-11-04 MED ORDER — SUGAMMADEX SODIUM 200 MG/2ML IV SOLN
INTRAVENOUS | Status: DC | PRN
  Administered 2021-11-04: 200 mg via INTRAVENOUS

## 2021-11-04 MED ORDER — ROCURONIUM BROMIDE 10 MG/ML (PF) SYRINGE
PREFILLED_SYRINGE | INTRAVENOUS | Status: AC
  Filled 2021-11-04: qty 10

## 2021-11-04 MED ORDER — DIPHENHYDRAMINE HCL 12.5 MG/5ML PO ELIX: 12.5000 mg | ORAL_SOLUTION | Freq: Four times a day (QID) | ORAL | Status: DC | PRN

## 2021-11-04 MED ORDER — BUPIVACAINE-EPINEPHRINE 0.25% -1:200000 IJ SOLN
INTRAMUSCULAR | Status: DC | PRN
  Administered 2021-11-04: 30 mL

## 2021-11-04 MED ORDER — HYOSCYAMINE SULFATE 0.125 MG SL SUBL
0.1250 mg | SUBLINGUAL_TABLET | Freq: Four times a day (QID) | SUBLINGUAL | Status: DC | PRN
  Administered 2021-11-04: 22:00:00 0.125 mg via SUBLINGUAL
  Filled 2021-11-04 (×3): qty 1

## 2021-11-04 MED ORDER — POLYETHYLENE GLYCOL 3350 17 G PO PACK
17.0000 g | PACK | Freq: Every day | ORAL | Status: DC
  Filled 2021-11-04: qty 1

## 2021-11-04 MED ORDER — SODIUM CHLORIDE 0.9 % IR SOLN
Status: DC | PRN
  Administered 2021-11-04: 1000 mL via INTRAVESICAL

## 2021-11-04 MED ORDER — LACTATED RINGERS IV SOLN: INTRAVENOUS | Status: DC

## 2021-11-04 MED ORDER — EPHEDRINE 5 MG/ML INJ
INTRAVENOUS | Status: AC
  Filled 2021-11-04: qty 5

## 2021-11-04 MED ORDER — ONDANSETRON HCL 4 MG/2ML IJ SOLN: 4.0000 mg | Freq: Once | INTRAMUSCULAR | Status: DC | PRN

## 2021-11-04 MED ORDER — MIDAZOLAM HCL 2 MG/2ML IJ SOLN
INTRAMUSCULAR | Status: DC | PRN
  Administered 2021-11-04: 2 mg via INTRAVENOUS

## 2021-11-04 MED ORDER — BACITRACIN-NEOMYCIN-POLYMYXIN 400-5-5000 EX OINT: 1.0000 "application " | TOPICAL_OINTMENT | Freq: Three times a day (TID) | CUTANEOUS | Status: DC | PRN

## 2021-11-04 MED ORDER — ONDANSETRON HCL 4 MG/2ML IJ SOLN
INTRAMUSCULAR | Status: AC
  Filled 2021-11-04: qty 2

## 2021-11-04 MED ORDER — KETAMINE HCL-SODIUM CHLORIDE 100-0.9 MG/10ML-% IV SOSY
PREFILLED_SYRINGE | INTRAVENOUS | Status: DC | PRN
Start: 2021-11-04 — End: 2021-11-04
  Administered 2021-11-04: 30 mg via INTRAVENOUS

## 2021-11-04 MED ORDER — TRAMADOL HCL 50 MG PO TABS
50.0000 mg | ORAL_TABLET | Freq: Four times a day (QID) | ORAL | 0 refills | Status: DC | PRN
Start: 2021-11-04 — End: 2022-08-09

## 2021-11-04 MED ORDER — ONDANSETRON HCL 4 MG/2ML IJ SOLN
INTRAMUSCULAR | Status: DC | PRN
Start: 2021-11-04 — End: 2021-11-04
  Administered 2021-11-04: 4 mg via INTRAVENOUS

## 2021-11-04 MED ORDER — DOCUSATE SODIUM 100 MG PO CAPS
100.0000 mg | ORAL_CAPSULE | Freq: Two times a day (BID) | ORAL | Status: DC
  Administered 2021-11-04 – 2021-11-05 (×2): 100 mg via ORAL
  Filled 2021-11-04 (×2): qty 1

## 2021-11-04 MED ORDER — DEXAMETHASONE SODIUM PHOSPHATE 10 MG/ML IJ SOLN
INTRAMUSCULAR | Status: AC
  Filled 2021-11-04: qty 1

## 2021-11-04 MED ORDER — LIDOCAINE HCL 2 % IJ SOLN
INTRAMUSCULAR | Status: AC
  Filled 2021-11-04: qty 20

## 2021-11-04 MED ORDER — KETOROLAC TROMETHAMINE 30 MG/ML IJ SOLN: 15.0000 mg | Freq: Once | INTRAMUSCULAR | Status: DC | PRN

## 2021-11-04 MED ORDER — SULFAMETHOXAZOLE-TRIMETHOPRIM 800-160 MG PO TABS: 1.0000 | ORAL_TABLET | Freq: Two times a day (BID) | ORAL | 0 refills | Status: DC

## 2021-11-04 MED ORDER — STERILE WATER FOR IRRIGATION IR SOLN
Status: DC | PRN
  Administered 2021-11-04: 1000 mL

## 2021-11-04 MED ORDER — DOCUSATE SODIUM 100 MG PO CAPS: 100.0000 mg | ORAL_CAPSULE | Freq: Two times a day (BID) | ORAL | Status: DC

## 2021-11-04 MED ORDER — LACTATED RINGERS IV SOLN: INTRAVENOUS | Status: DC | PRN

## 2021-11-04 MED ORDER — PROPOFOL 10 MG/ML IV BOLUS
INTRAVENOUS | Status: AC
  Filled 2021-11-04: qty 20

## 2021-11-04 MED ORDER — ACETAMINOPHEN 325 MG PO TABS
650.0000 mg | ORAL_TABLET | ORAL | Status: DC | PRN
  Administered 2021-11-04 – 2021-11-05 (×4): 650 mg via ORAL
  Filled 2021-11-04 (×4): qty 2

## 2021-11-04 MED ORDER — FENTANYL CITRATE (PF) 100 MCG/2ML IJ SOLN
INTRAMUSCULAR | Status: DC | PRN
  Administered 2021-11-04: 50 ug via INTRAVENOUS
  Administered 2021-11-04: 100 ug via INTRAVENOUS
  Administered 2021-11-04: 50 ug via INTRAVENOUS

## 2021-11-04 MED ORDER — EPHEDRINE SULFATE-NACL 50-0.9 MG/10ML-% IV SOSY
PREFILLED_SYRINGE | INTRAVENOUS | Status: DC | PRN
  Administered 2021-11-04: 5 mg via INTRAVENOUS

## 2021-11-04 MED ORDER — LACTATED RINGERS IV SOLN
INTRAVENOUS | Status: DC | PRN
  Administered 2021-11-04: 08:00:00 1001 mL

## 2021-11-04 MED ORDER — FENTANYL CITRATE (PF) 100 MCG/2ML IJ SOLN
INTRAMUSCULAR | Status: AC
  Filled 2021-11-04: qty 2

## 2021-11-04 MED ORDER — ONDANSETRON HCL 4 MG/2ML IJ SOLN: 4.0000 mg | INTRAMUSCULAR | Status: DC | PRN

## 2021-11-04 MED ORDER — KCL IN DEXTROSE-NACL 20-5-0.45 MEQ/L-%-% IV SOLN
INTRAVENOUS | Status: DC
  Filled 2021-11-04 (×4): qty 1000

## 2021-11-04 MED ORDER — KETOROLAC TROMETHAMINE 15 MG/ML IJ SOLN
15.0000 mg | Freq: Four times a day (QID) | INTRAMUSCULAR | Status: DC
  Administered 2021-11-04 – 2021-11-05 (×3): 15 mg via INTRAVENOUS
  Filled 2021-11-04 (×5): qty 1

## 2021-11-04 MED ORDER — FLEET ENEMA 7-19 GM/118ML RE ENEM: 1.0000 | ENEMA | Freq: Once | RECTAL | Status: DC

## 2021-11-04 MED ORDER — ALPRAZOLAM 0.25 MG PO TABS
0.2500 mg | ORAL_TABLET | Freq: Every evening | ORAL | Status: DC | PRN
  Administered 2021-11-04: 22:00:00 0.25 mg via ORAL
  Filled 2021-11-04: qty 1

## 2021-11-04 MED ORDER — HEPARIN SODIUM (PORCINE) 1000 UNIT/ML IJ SOLN
INTRAMUSCULAR | Status: AC
  Filled 2021-11-04: qty 1

## 2021-11-04 MED ORDER — BUPIVACAINE-EPINEPHRINE (PF) 0.25% -1:200000 IJ SOLN
INTRAMUSCULAR | Status: AC
  Filled 2021-11-04: qty 30

## 2021-11-04 SURGICAL SUPPLY — 70 items
ADH SKN CLS APL DERMABOND .7 (GAUZE/BANDAGES/DRESSINGS) ×2
APL PRP STRL LF DISP 70% ISPRP (MISCELLANEOUS) ×2
APL SWBSTK 6 STRL LF DISP (MISCELLANEOUS) ×2
APPLICATOR COTTON TIP 6 STRL (MISCELLANEOUS) ×2 IMPLANT
APPLICATOR COTTON TIP 6IN STRL (MISCELLANEOUS) ×3
BAG COUNTER SPONGE SURGICOUNT (BAG) IMPLANT
BAG SPNG CNTER NS LX DISP (BAG)
CATH FOLEY 2WAY SLVR 18FR 30CC (CATHETERS) ×3 IMPLANT
CATH ROBINSON RED A/P 16FR (CATHETERS) ×3 IMPLANT
CATH ROBINSON RED A/P 8FR (CATHETERS) ×3 IMPLANT
CATH TIEMANN FOLEY 18FR 5CC (CATHETERS) ×3 IMPLANT
CHLORAPREP W/TINT 26 (MISCELLANEOUS) ×3 IMPLANT
CLIP LIGATING HEM O LOK PURPLE (MISCELLANEOUS) ×6 IMPLANT
COVER SURGICAL LIGHT HANDLE (MISCELLANEOUS) ×3 IMPLANT
COVER TIP SHEARS 8 DVNC (MISCELLANEOUS) ×2 IMPLANT
COVER TIP SHEARS 8MM DA VINCI (MISCELLANEOUS) ×3
CUTTER ECHEON FLEX ENDO 45 340 (ENDOMECHANICALS) ×3 IMPLANT
DERMABOND ADVANCED (GAUZE/BANDAGES/DRESSINGS) ×1
DERMABOND ADVANCED .7 DNX12 (GAUZE/BANDAGES/DRESSINGS) ×2 IMPLANT
DRAIN CHANNEL RND F F (WOUND CARE) IMPLANT
DRAPE ARM DVNC X/XI (DISPOSABLE) ×8 IMPLANT
DRAPE COLUMN DVNC XI (DISPOSABLE) ×2 IMPLANT
DRAPE DA VINCI XI ARM (DISPOSABLE) ×12
DRAPE DA VINCI XI COLUMN (DISPOSABLE) ×3
DRAPE SURG IRRIG POUCH 19X23 (DRAPES) ×3 IMPLANT
DRSG TEGADERM 4X4.75 (GAUZE/BANDAGES/DRESSINGS) ×3 IMPLANT
ELECT PENCIL ROCKER SW 15FT (MISCELLANEOUS) ×3 IMPLANT
ELECT REM PT RETURN 15FT ADLT (MISCELLANEOUS) ×3 IMPLANT
GAUZE 4X4 16PLY ~~LOC~~+RFID DBL (SPONGE) ×3 IMPLANT
GAUZE SPONGE 4X4 12PLY STRL (GAUZE/BANDAGES/DRESSINGS) ×3 IMPLANT
GLOVE SURG ENC MOIS LTX SZ6.5 (GLOVE) ×3 IMPLANT
GLOVE SURG ENC TEXT LTX SZ7.5 (GLOVE) ×6 IMPLANT
GOWN STRL REUS W/TWL LRG LVL3 (GOWN DISPOSABLE) ×10 IMPLANT
HOLDER FOLEY CATH W/STRAP (MISCELLANEOUS) ×3 IMPLANT
IRRIG SUCT STRYKERFLOW 2 WTIP (MISCELLANEOUS) ×3
IRRIGATION SUCT STRKRFLW 2 WTP (MISCELLANEOUS) ×2 IMPLANT
IV LACTATED RINGERS 1000ML (IV SOLUTION) ×3 IMPLANT
IV NS 1000ML (IV SOLUTION) ×3
IV NS 1000ML BAXH (IV SOLUTION) IMPLANT
KIT TURNOVER KIT A (KITS) IMPLANT
NDL SAFETY ECLIPSE 18X1.5 (NEEDLE) ×2 IMPLANT
NEEDLE HYPO 18GX1.5 SHARP (NEEDLE)
PACK ROBOT UROLOGY CUSTOM (CUSTOM PROCEDURE TRAY) ×3 IMPLANT
PENCIL SMOKE EVACUATOR (MISCELLANEOUS) IMPLANT
RELOAD STAPLE 45 4.1 GRN THCK (STAPLE) ×2 IMPLANT
SEAL CANN UNIV 5-8 DVNC XI (MISCELLANEOUS) ×8 IMPLANT
SEAL XI 5MM-8MM UNIVERSAL (MISCELLANEOUS) ×12
SET TUBE SMOKE EVAC HIGH FLOW (TUBING) ×3 IMPLANT
SOLUTION ELECTROLUBE (MISCELLANEOUS) ×3 IMPLANT
SPIKE FLUID TRANSFER (MISCELLANEOUS) ×3 IMPLANT
SPONGE GAUZE 2X2 8PLY STRL LF (GAUZE/BANDAGES/DRESSINGS) ×1 IMPLANT
STAPLE RELOAD 45 GRN (STAPLE) ×2 IMPLANT
STAPLE RELOAD 45MM GREEN (STAPLE) ×3
SUT ETHILON 3 0 PS 1 (SUTURE) ×3 IMPLANT
SUT MNCRL 3 0 RB1 (SUTURE) ×2 IMPLANT
SUT MNCRL 3 0 VIOLET RB1 (SUTURE) ×2 IMPLANT
SUT MNCRL AB 4-0 PS2 18 (SUTURE) ×6 IMPLANT
SUT MONOCRYL 3 0 RB1 (SUTURE) ×6
SUT PDS PLUS 0 (SUTURE) ×6
SUT PDS PLUS AB 0 CT-2 (SUTURE) ×4 IMPLANT
SUT VIC AB 0 CT1 27 (SUTURE) ×3
SUT VIC AB 0 CT1 27XBRD ANTBC (SUTURE) ×2 IMPLANT
SUT VIC AB 0 UR5 27 (SUTURE) ×3 IMPLANT
SUT VIC AB 2-0 SH 27 (SUTURE) ×3
SUT VIC AB 2-0 SH 27X BRD (SUTURE) ×2 IMPLANT
SUT VICRYL 0 UR6 27IN ABS (SUTURE) ×1 IMPLANT
SYR 27GX1/2 1ML LL SAFETY (SYRINGE) ×3 IMPLANT
TOWEL OR NON WOVEN STRL DISP B (DISPOSABLE) ×3 IMPLANT
TROCAR XCEL NON-BLD 5MMX100MML (ENDOMECHANICALS) IMPLANT
WATER STERILE IRR 1000ML POUR (IV SOLUTION) ×3 IMPLANT

## 2021-11-04 NOTE — Discharge Instructions (Signed)

## 2021-11-04 NOTE — Op Note (Signed)
Preoperative diagnosis: Clinically localized adenocarcinoma of the prostate (clinical stage T2a N0 M0) ? ?Postoperative diagnosis: Clinically localized adenocarcinoma of the prostate (clinical stage T2a N0 M0) ? ?Procedure: ? ?Robotic assisted laparoscopic radical prostatectomy (right nerve sparing) ?Bilateral robotic assisted laparoscopic pelvic lymphadenectomy ? ?Surgeon: Roxy Horseman, Brooke Bonito. M.D. ? ?Assistant(s): Debbrah Alar, PA-C ? ?An assistant was required for this surgical procedure.  The duties of the assistant included but were not limited to suctioning, passing suture, camera manipulation, retraction. This procedure would not be able to be performed without an Environmental consultant.  ? ?Anesthesia: General ? ?Complications: None ? ?EBL: 100 mL ? ?IVF:  1200 mL crystalloid ? ?Specimens: ?Prostate and seminal vesicles ?Right pelvic lymph nodes ?Left pelvic lymph nodes ? ?Disposition of specimens: Pathology ? ?Drains: ?20 Fr coude catheter ?# 19 Blake pelvic drain ? ?Indication: Peter Novak is a 71 y.o. patient with clinically localized prostate cancer.  After a thorough review of the management options for treatment of prostate cancer, he elected to proceed with surgical therapy and the above procedure(s).  We have discussed the potential benefits and risks of the procedure, side effects of the proposed treatment, the likelihood of the patient achieving the goals of the procedure, and any potential problems that might occur during the procedure or recuperation. Informed consent has been obtained. ? ?Description of procedure: ? ?The patient was taken to the operating room and a general anesthetic was administered. He was given preoperative antibiotics, placed in the dorsal lithotomy position, and prepped and draped in the usual sterile fashion. Next a preoperative timeout was performed. A urethral catheter was placed into the bladder and a site was selected near the umbilicus for placement of the camera port. This  was placed using a standard open Hassan technique which allowed entry into the peritoneal cavity under direct vision and without difficulty. An 8 mm port was placed and a pneumoperitoneum established. The camera was then used to inspect the abdomen and there was no evidence of any intra-abdominal injuries or other abnormalities. The remaining abdominal ports were then placed. 8 mm robotic ports were placed in the right lower quadrant, left lower quadrant, and far left lateral abdominal wall. A 5 mm port was placed in the right upper quadrant and a 12 mm port was placed in the right lateral abdominal wall for laparoscopic assistance. All ports were placed under direct vision without difficulty. The surgical cart was then docked.  ? ?Utilizing the cautery scissors, the bladder was reflected posteriorly allowing entry into the space of Retzius and identification of the endopelvic fascia and prostate. The periprostatic fat was then removed from the prostate allowing full exposure of the endopelvic fascia. The endopelvic fascia was then incised from the apex back to the base of the prostate bilaterally and the underlying levator muscle fibers were swept laterally off the prostate thereby isolating the dorsal venous complex. There was a right accessory obturator artery that was preserved. The dorsal vein was then stapled and divided with a 45 mm Flex Echelon stapler. Attention then turned to the bladder neck which was divided anteriorly thereby allowing entry into the bladder and exposure of the urethral catheter. The catheter balloon was deflated and the catheter was brought into the operative field and used to retract the prostate anteriorly. The posterior bladder neck was then examined and was divided allowing further dissection between the bladder and prostate posteriorly until the vasa deferentia and seminal vessels were identified. The vasa deferentia were isolated, divided, and lifted  anteriorly. The seminal  vesicles were dissected down to their tips with care to control the seminal vascular arterial blood supply. These structures were then lifted anteriorly and the space between Denonvillier?s fascia and the anterior rectum was developed with a combination of sharp and blunt dissection. This isolated the vascular pedicles of the prostate. ? ?The lateral prostatic fascia on the right side of the prostate was then sharply incised allowing release of the neurovascular bundle. The vascular pedicle of the prostate on the right side was then ligated with Weck clips between the prostate and neurovascular bundle and divided with sharp cold scissor dissection resulting in neurovascular bundle preservation. On the left side, a wide non nerve sparing dissection was performed with Weck clips used to ligate the vascular pedicle of the prostate. The neurovascular bundle on the right side was then separated off the apex of the prostate and urethra. ? ?The urethra was then sharply transected allowing the prostate specimen to be disarticulated. The pelvis was copiously irrigated and hemostasis was ensured. There was no evidence for rectal injury. ? ?Attention then turned to the right pelvic sidewall. The fibrofatty tissue between the external iliac vein, confluence of the iliac vessels, hypogastric artery, and Cooper's ligament was dissected free from the pelvic sidewall with care to preserve the obturator nerve. Weck clips were used for lymphostasis and hemostasis. An identical procedure was performed on the contralateral side and the lymphatic packets were removed for permanent pathologic analysis. ? ?Attention then turned to the urethral anastomosis. A 2-0 Vicryl slip knot was placed between Denonvillier?s fascia, the posterior bladder neck, and the posterior urethra to reapproximate these structures. A double-armed 3-0 Monocryl suture was then used to perform a 360? running tension-free anastomosis between the bladder neck and  urethra. A new urethral catheter was then placed into the bladder and irrigated. There were no blood clots within the bladder and the anastomosis appeared to be watertight. A #19 Blake drain was then brought through the left lateral 8 mm port site and positioned appropriately within the pelvis. It was secured to the skin with a nylon suture. The surgical cart was then undocked. The right lateral 12 mm port site was closed at the fascial level with a 0 Vicryl suture placed laparoscopically. All remaining ports were then removed under direct vision. The prostate specimen was removed intact within the Endopouch retrieval bag via the periumbilical camera port site. This fascial opening was closed with two running 0 Vicryl sutures. 0.25% Marcaine was then injected into all port sites and all incisions were reapproximated at the skin level with 4-0 Monocryl subcuticular sutures. Dermabond was applied. The patient appeared to tolerate the procedure well and without complications. The patient was able to be extubated and transferred to the recovery unit in satisfactory condition. ? ? ?Pryor Curia MD  ?

## 2021-11-04 NOTE — Transfer of Care (Signed)
Immediate Anesthesia Transfer of Care Note ? ?Patient: Peter Novak ? ?Procedure(s) Performed: XI ROBOTIC ASSISTED LAPAROSCOPIC RADICAL PROSTATECTOMY LEVEL 3 (Prostate) ?LYMPHADENECTOMY, PELVIC (Bilateral: Pelvis) ? ?Patient Location: PACU ? ?Anesthesia Type:General ? ?Level of Consciousness: sedated ? ?Airway & Oxygen Therapy: Patient Spontanous Breathing and Patient connected to face mask oxygen ? ?Post-op Assessment: Report given to RN and Post -op Vital signs reviewed and stable ? ?Post vital signs: Reviewed and stable ? ?Last Vitals:  ?Vitals Value Taken Time  ?BP 129/72 11/04/21 0948  ?Temp    ?Pulse 55 11/04/21 0947  ?Resp    ?SpO2 99 % 11/04/21 0947  ?Vitals shown include unvalidated device data. ? ?Last Pain:  ?Vitals:  ? 11/04/21 0630  ?TempSrc: Oral  ?   ? ?  ? ?Complications: No notable events documented. ?

## 2021-11-04 NOTE — Anesthesia Procedure Notes (Signed)
Procedure Name: Intubation ?Date/Time: 11/04/2021 7:23 AM ?Performed by: Lind Covert, CRNA ?Pre-anesthesia Checklist: Patient identified, Emergency Drugs available, Suction available and Patient being monitored ?Patient Re-evaluated:Patient Re-evaluated prior to induction ?Oxygen Delivery Method: Circle system utilized ?Preoxygenation: Pre-oxygenation with 100% oxygen ?Induction Type: IV induction ?Ventilation: Mask ventilation without difficulty ?Laryngoscope Size: Mac and 4 ?Grade View: Grade I ?Tube size: 7.5 mm ?Number of attempts: 1 ?Airway Equipment and Method: Stylet ?Placement Confirmation: ETT inserted through vocal cords under direct vision, positive ETCO2 and breath sounds checked- equal and bilateral ?Secured at: 23 cm ?Tube secured with: Tape ?Dental Injury: Teeth and Oropharynx as per pre-operative assessment  ? ? ? ? ?

## 2021-11-04 NOTE — Interval H&P Note (Signed)
H&P updated. No changes.

## 2021-11-04 NOTE — Progress Notes (Signed)
Mobility Specialist - Progress Note ? ? ? 11/04/21 1332  ?Mobility  ?Activity Ambulated with assistance in hallway  ?Level of Assistance Minimal assist, patient does 75% or more  ?Assistive Device Front wheel walker  ?Distance Ambulated (ft) 120 ft  ?Activity Response Tolerated well  ?$Mobility charge 1 Mobility  ? ?Pt required Min A to log roll out of supine position and to sit EOB. Pt needed Min A to stand from bed and experienced 1 LOB episode, but recovered quickly. Pt ambulated ~120 ft in hallway using RW, c/o of "tugging" pain at surgical site. Reported pain does not persist with ambulating. Pt returned to room after session and left in bed with call bell at side, family and RN in room.  ? ?Mikkel Charrette S. ?Mobility Specialist ?[facility] ?Phone: [phone] ?11/04/21, 1:35 PM ? ? ? ? ?

## 2021-11-04 NOTE — Progress Notes (Signed)
Post-op note ? ?Subjective: ?The patient is doing well.  No complaints except for mild bladder spasms. ? ?Objective: ?Vital signs in last 24 hours: ?Temp:  [97.4 ?F (36.3 ?C)-98.4 ?F (36.9 ?C)] 98.1 ?F (36.7 ?C) (03/09 1547) ?Pulse Rate:  [49-76] 76 (03/09 1547) ?Resp:  [6-18] 18 (03/09 1547) ?BP: (129-168)/(72-96) 151/85 (03/09 1547) ?SpO2:  [95 %-100 %] 96 % (03/09 1547) ?Weight:  [108.3 kg] 108.3 kg (03/09 1202) ? ?Intake/Output from previous day: ?No intake/output data recorded. ?Intake/Output this shift: ?Total I/O ?In: 1500 [I.V.:1500] ?Out: 195 [Drains:95; Blood:100] ? ?Physical Exam:  ?General: Alert and oriented. ?Abdomen: Soft, Nondistended. ?Incisions: Clean and dry. ?GU: Urine clear. ? ?Lab Results: ?Recent Labs  ?  11/04/21 ?1033  ?HGB 14.0  ?HCT 41.9  ? ? ?Assessment/Plan: ?POD#0  ? ?1) Continue to monitor, ambulate, IS ? ? ?Pryor Curia MD ? ? LOS: 0 days  ? ?Les Alinda Money ?11/04/2021, 4:05 PM ? ? ?   ?

## 2021-11-04 NOTE — Anesthesia Postprocedure Evaluation (Signed)
Anesthesia Post Note ? ?Patient: Peter Novak ? ?Procedure(s) Performed: XI ROBOTIC ASSISTED LAPAROSCOPIC RADICAL PROSTATECTOMY LEVEL 3 (Prostate) ?LYMPHADENECTOMY, PELVIC (Bilateral: Pelvis) ? ?  ? ?Patient location during evaluation: PACU ?Anesthesia Type: General ?Level of consciousness: awake and alert ?Pain management: pain level controlled ?Vital Signs Assessment: post-procedure vital signs reviewed and stable ?Respiratory status: spontaneous breathing, nonlabored ventilation, respiratory function stable and patient connected to nasal cannula oxygen ?Cardiovascular status: blood pressure returned to baseline and stable ?Postop Assessment: no apparent nausea or vomiting ?Anesthetic complications: no ? ? ?No notable events documented. ? ?Last Vitals:  ?Vitals:  ? 11/04/21 1146 11/04/21 1547  ?BP: (!) 154/80 (!) 151/85  ?Pulse: 69 76  ?Resp: 18 18  ?Temp: 36.9 ?C 36.7 ?C  ?SpO2: 95% 96%  ?  ?Last Pain:  ?Vitals:  ? 11/04/21 1547  ?TempSrc: Oral  ?PainSc:   ? ? ?  ?  ?  ?  ?  ?  ? ?Barnet Glasgow ? ? ? ? ?

## 2021-11-05 ENCOUNTER — Encounter (HOSPITAL_COMMUNITY): Payer: Self-pay | Admitting: Urology

## 2021-11-05 DIAGNOSIS — R7309 Other abnormal glucose: Secondary | ICD-10-CM | POA: Diagnosis not present

## 2021-11-05 DIAGNOSIS — A6002 Herpesviral infection of other male genital organs: Secondary | ICD-10-CM | POA: Diagnosis not present

## 2021-11-05 DIAGNOSIS — C61 Malignant neoplasm of prostate: Secondary | ICD-10-CM | POA: Diagnosis not present

## 2021-11-05 DIAGNOSIS — Z7982 Long term (current) use of aspirin: Secondary | ICD-10-CM | POA: Diagnosis not present

## 2021-11-05 DIAGNOSIS — E785 Hyperlipidemia, unspecified: Secondary | ICD-10-CM | POA: Diagnosis not present

## 2021-11-05 DIAGNOSIS — M6281 Muscle weakness (generalized): Secondary | ICD-10-CM | POA: Diagnosis not present

## 2021-11-05 DIAGNOSIS — K219 Gastro-esophageal reflux disease without esophagitis: Secondary | ICD-10-CM | POA: Diagnosis not present

## 2021-11-05 DIAGNOSIS — R911 Solitary pulmonary nodule: Secondary | ICD-10-CM | POA: Diagnosis not present

## 2021-11-05 DIAGNOSIS — M62838 Other muscle spasm: Secondary | ICD-10-CM | POA: Diagnosis not present

## 2021-11-05 LAB — HEMOGLOBIN AND HEMATOCRIT, BLOOD
HCT: 37.4 % — ABNORMAL LOW (ref 39.0–52.0)
Hemoglobin: 12.2 g/dL — ABNORMAL LOW (ref 13.0–17.0)

## 2021-11-05 MED ORDER — CHLORHEXIDINE GLUCONATE CLOTH 2 % EX PADS
6.0000 | MEDICATED_PAD | Freq: Every day | CUTANEOUS | Status: DC
  Administered 2021-11-05: 6 via TOPICAL

## 2021-11-05 MED ORDER — TRAMADOL HCL 50 MG PO TABS: 50.0000 mg | ORAL_TABLET | Freq: Four times a day (QID) | ORAL | Status: DC | PRN

## 2021-11-05 MED ORDER — BISACODYL 10 MG RE SUPP
10.0000 mg | Freq: Once | RECTAL | Status: AC
  Administered 2021-11-05: 10 mg via RECTAL
  Filled 2021-11-05: qty 1

## 2021-11-05 NOTE — Discharge Summary (Signed)
?  Date of admission: 11/04/2021 ? ?Date of discharge: 11/05/2021 ? ?Admission diagnosis: Prostate Cancer ? ?Discharge diagnosis: Prostate Cancer ? ?History and Physical: For full details, please see admission history and physical. Briefly, Peter Novak is a 71 y.o. gentleman with localized prostate cancer.  After discussing management/treatment options, he elected to proceed with surgical treatment. ? ?Hospital Course: JAHKARI MACLIN was taken to the operating room on 11/04/2021 and underwent a robotic assisted laparoscopic radical prostatectomy. He tolerated this procedure well and without complications. Postoperatively, he was able to be transferred to a regular hospital room following recovery from anesthesia.  He was able to begin ambulating the night of surgery. He remained hemodynamically stable overnight.  He had excellent urine output with appropriately minimal output from his pelvic drain and his pelvic drain was removed on POD #1.  He was transitioned to oral pain medication, tolerated a clear liquid diet, and had met all discharge criteria and was able to be discharged home later on POD#1. ? ?Laboratory values:  ?Recent Labs  ?  11/04/21 ?1033 11/05/21 ?0442  ?HGB 14.0 12.2*  ?HCT 41.9 37.4*  ? ? ?Disposition: Home ? ?Discharge instruction: He was instructed to be ambulatory but to refrain from heavy lifting, strenuous activity, or driving. He was instructed on urethral catheter care. ? ?Discharge medications:   ?Allergies as of 11/05/2021   ?No Known Allergies ?  ? ?  ?Medication List  ?  ? ?STOP taking these medications   ? ?aspirin 81 MG EC tablet ?  ?BIOTIN PO ?  ?CALCIUM CITRATE PO ?  ?CO Q 10 PO ?  ?ibuprofen 200 MG tablet ?Commonly known as: ADVIL ?  ?MAGNESIUM CITRATE PO ?  ?METAMUCIL PO ?  ?multivitamin tablet ?  ?VITAMIN B-6 PO ?  ?VITAMIN C PO ?  ?ZINC PO ?  ? ?  ? ?TAKE these medications   ? ?acetaminophen 325 MG tablet ?Commonly known as: TYLENOL ?Take 650 mg by mouth every 6 (six) hours as  needed for moderate pain. ?  ?ALLERGY RELIEF PO ?Take 2 mg by mouth daily as needed (allergies). ?  ?ALPRAZolam 0.5 MG tablet ?Commonly known as: Duanne Moron ?Take 0.25 mg by mouth at bedtime as needed for anxiety. ?  ?atorvastatin 20 MG tablet ?Commonly known as: LIPITOR ?TAKE ONE TABLET BY MOUTH DAILY ?  ?docusate sodium 100 MG capsule ?Commonly known as: COLACE ?Take 1 capsule (100 mg total) by mouth 2 (two) times daily. ?  ?esomeprazole 20 MG capsule ?Commonly known as: NexIUM ?Take 1 capsule (20 mg total) by mouth daily. ?  ?sulfamethoxazole-trimethoprim 800-160 MG tablet ?Commonly known as: BACTRIM DS ?Take 1 tablet by mouth 2 (two) times daily. Start the day prior to foley removal appointment ?  ?traMADol 50 MG tablet ?Commonly known as: Ultram ?Take 1-2 tablets (50-100 mg total) by mouth every 6 (six) hours as needed for moderate pain or severe pain. ?  ? ?  ? ? ?Followup: He will followup in 1 week for catheter removal and to discuss his surgical pathology results. ?  ?

## 2021-11-05 NOTE — Progress Notes (Signed)
Pt to be discharged to home today. Pt and Pt's family given discharge teaching including al discharge Medications and schedules for these Medications. Home care post Prostatectomy reviewed Foley care and changing to leg bag. All other home post op care reviewed with the Patient. Pt verbalized understanding of all discharge teaching. Discharge AVS with the Patient at time of discharge  ?

## 2021-11-05 NOTE — Progress Notes (Signed)
1 Day Post-Op ?Subjective: ?The patient is doing well.  No nausea or vomiting. Pain is adequately controlled. ? ?Objective: ?Vital signs in last 24 hours: ?Temp:  [97.4 ?F (36.3 ?C)-98.9 ?F (37.2 ?C)] 98.9 ?F (37.2 ?C) (03/10 7414) ?Pulse Rate:  [52-85] 85 (03/10 0508) ?Resp:  [6-20] 20 (03/10 2395) ?BP: (120-168)/(72-96) 120/72 (03/10 3202) ?SpO2:  [93 %-100 %] 93 % (03/10 0508) ?Weight:  [108.3 kg] 108.3 kg (03/09 1202) ? ?Intake/Output from previous day: ?03/09 0701 - 03/10 0700 ?In: 4466.5 [P.O.:480; I.V.:3886.5; IV Piggyback:100] ?Out: 2845 [Urine:2500; Drains:245; Blood:100] ?Intake/Output this shift: ?No intake/output data recorded. ? ?Physical Exam:  ?General: Alert and oriented. ?CV: RRR ?Lungs: Clear bilaterally. ?GI: Soft, Nondistended. ?Incisions: Clean, dry, and intact ?Urine: Clear ?Extremities: Nontender, no erythema, no edema. ? ?Lab Results: ?Recent Labs  ?  11/04/21 ?1033 11/05/21 ?0442  ?HGB 14.0 12.2*  ?HCT 41.9 37.4*  ? ? ?  ?Assessment/Plan: ?POD# 1 s/p robotic prostatectomy. ? ?1) SL IVF ?2) Ambulate, Incentive spirometry ?3) Transition to oral pain medication ?4) Dulcolax suppository ?5) D/C pelvic drain ?6) Plan for likely discharge later today ? ? ?Pryor Curia MD ? ? LOS: 0 days  ? ?Les Alinda Money ?11/05/2021, 8:12 AM ? ?  ?

## 2021-11-10 LAB — SURGICAL PATHOLOGY

## 2021-12-01 DIAGNOSIS — Z85820 Personal history of malignant melanoma of skin: Secondary | ICD-10-CM | POA: Diagnosis not present

## 2021-12-01 DIAGNOSIS — L570 Actinic keratosis: Secondary | ICD-10-CM | POA: Diagnosis not present

## 2021-12-01 DIAGNOSIS — Z85828 Personal history of other malignant neoplasm of skin: Secondary | ICD-10-CM | POA: Diagnosis not present

## 2021-12-01 DIAGNOSIS — L738 Other specified follicular disorders: Secondary | ICD-10-CM | POA: Diagnosis not present

## 2021-12-01 DIAGNOSIS — L820 Inflamed seborrheic keratosis: Secondary | ICD-10-CM | POA: Diagnosis not present

## 2021-12-01 DIAGNOSIS — L821 Other seborrheic keratosis: Secondary | ICD-10-CM | POA: Diagnosis not present

## 2021-12-02 DIAGNOSIS — N393 Stress incontinence (female) (male): Secondary | ICD-10-CM | POA: Diagnosis not present

## 2021-12-02 DIAGNOSIS — M62838 Other muscle spasm: Secondary | ICD-10-CM | POA: Diagnosis not present

## 2021-12-02 DIAGNOSIS — M6281 Muscle weakness (generalized): Secondary | ICD-10-CM | POA: Diagnosis not present

## 2021-12-16 DIAGNOSIS — N393 Stress incontinence (female) (male): Secondary | ICD-10-CM | POA: Diagnosis not present

## 2021-12-16 DIAGNOSIS — M62838 Other muscle spasm: Secondary | ICD-10-CM | POA: Diagnosis not present

## 2021-12-16 DIAGNOSIS — M6281 Muscle weakness (generalized): Secondary | ICD-10-CM | POA: Diagnosis not present

## 2021-12-20 ENCOUNTER — Telehealth: Payer: Self-pay | Admitting: *Deleted

## 2021-12-23 ENCOUNTER — Inpatient Hospital Stay: Payer: Medicare PPO | Attending: Oncology | Admitting: *Deleted

## 2021-12-23 ENCOUNTER — Encounter: Payer: Self-pay | Admitting: *Deleted

## 2021-12-23 ENCOUNTER — Other Ambulatory Visit: Payer: Self-pay

## 2021-12-23 VITALS — BP 126/64 | HR 63 | Temp 97.4°F | Resp 16 | Ht 74.0 in | Wt 237.6 lb

## 2021-12-23 DIAGNOSIS — C61 Malignant neoplasm of prostate: Secondary | ICD-10-CM

## 2021-12-23 NOTE — Progress Notes (Signed)
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 ? ?Patient decided to proceed with surgical intervention and successfully had this on 11/04/2021.  ? ?Patient has had post op follow up with urology and is scheduled for his first post treatment PSA on 5/31.  ?

## 2021-12-23 NOTE — Progress Notes (Signed)
Pt denies pain today. Vital signs are within normal range. He says he seems to be doing well. Pt does not have any urinary leakage and is emptying bladder with voids. He is sleeping close to 8- 9 hours a night. He usually gets up twice to bathroom. Pt contributes the pelvic therapy with Neoma Laming in helping him have more urinary control. ?He will have PSA labs on May 31 and see Dr. Alinda Money on June 7. Pt is exercising regularly. He walks 1 1/2 miles 6 days a week. Reviewed the [employer] with him. Last colonoscopy was 2018. He knows he is due this year. Pt has seen dermatologist 2 weeks ago for his annual skin check,everything was good. He will see PCP in the fall. Allergy and medication list reviewed and updated. Vaccinations are updated. SCP reviewed and completed. ?

## 2022-01-26 DIAGNOSIS — C61 Malignant neoplasm of prostate: Secondary | ICD-10-CM | POA: Diagnosis not present

## 2022-01-27 ENCOUNTER — Ambulatory Visit: Payer: Medicare PPO | Admitting: Family Medicine

## 2022-01-27 ENCOUNTER — Encounter: Payer: Self-pay | Admitting: Family Medicine

## 2022-01-27 VITALS — BP 130/82 | HR 58 | Temp 98.0°F | Resp 16 | Ht 75.0 in | Wt 236.8 lb

## 2022-01-27 DIAGNOSIS — R5383 Other fatigue: Secondary | ICD-10-CM

## 2022-01-27 LAB — COMPREHENSIVE METABOLIC PANEL
ALT: 26 U/L (ref 0–53)
AST: 17 U/L (ref 0–37)
Albumin: 4.3 g/dL (ref 3.5–5.2)
Alkaline Phosphatase: 73 U/L (ref 39–117)
BUN: 20 mg/dL (ref 6–23)
CO2: 24 mEq/L (ref 19–32)
Calcium: 9.3 mg/dL (ref 8.4–10.5)
Chloride: 106 mEq/L (ref 96–112)
Creatinine, Ser: 1 mg/dL (ref 0.40–1.50)
GFR: 75.26 mL/min (ref >60.00)
Glucose, Bld: 100 mg/dL — ABNORMAL HIGH (ref 70–99)
Potassium: 4.1 mEq/L (ref 3.5–5.1)
Sodium: 139 mEq/L (ref 135–145)
Total Bilirubin: 0.8 mg/dL (ref 0.2–1.2)
Total Protein: 6.7 g/dL (ref 6.0–8.3)

## 2022-01-27 LAB — CBC
HCT: 40.1 % (ref 39.0–52.0)
Hemoglobin: 13.4 g/dL (ref 13.0–17.0)
MCHC: 33.4 g/dL (ref 30.0–36.0)
MCV: 89.2 fl (ref 78.0–100.0)
Platelets: 226 10*3/uL (ref 150.0–400.0)
RBC: 4.5 Mil/uL (ref 4.22–5.81)
RDW: 13.7 % (ref 11.5–15.5)
WBC: 5.8 10*3/uL (ref 4.0–10.5)

## 2022-01-27 LAB — HEMOGLOBIN A1C: Hgb A1c MFr Bld: 6 % (ref 4.6–6.5)

## 2022-01-27 LAB — TSH: TSH: 1.55 u[IU]/mL (ref 0.35–5.50)

## 2022-01-27 LAB — VITAMIN D 25 HYDROXY (VIT D DEFICIENCY, FRACTURES): VITD: 21.97 ng/mL — ABNORMAL LOW (ref 30.00–100.00)

## 2022-01-27 LAB — VITAMIN B12: Vitamin B-12: 328 pg/mL (ref 211–911)

## 2022-01-27 NOTE — Patient Instructions (Signed)
It was very nice to see you today!  We will check blood work today.  We may need to get a sleep study depending on the results of your blood work.  Take care, Dr Jerline Pain  PLEASE NOTE:  If you had any lab tests please let us know if you have not heard back within a few days. You may see your results on mychart before we have a chance to review them but we will give you a call once they are reviewed by [place]. If we ordered any referrals today, please let us know if you have not heard from their office within the next week.   Please try these tips to maintain a healthy lifestyle:  Eat at least 3 REAL meals and 1-2 snacks per day.  Aim for no more than 5 hours between eating.  If you eat breakfast, please do so within one hour of getting up.   Each meal should contain half fruits/vegetables, one quarter protein, and one quarter carbs (no bigger than a computer mouse)  Cut down on sweet beverages. This includes juice, soda, and sweet tea.   Drink at least 1 glass of water with each meal and aim for at least 8 glasses per day  Exercise at least 150 minutes every week.

## 2022-01-27 NOTE — Progress Notes (Signed)
   Peter Novak is a 71 y.o. male who presents today for an office visit.  Assessment/Plan:  Other Fatigue Broad differential.  No localizing or specific symptoms though probably multifactorial.  We will check labs today including CBC, c-Met, TSH, and B12.  It is possible that he may have underlying sleep disorder as well given that he will have improvement in symptoms with napping in middle of the day as well as improvement with taking his dextroamphetamine.  If his labs are negative would recommend sleep study to further evaluate.  He does have multiple other factors including recent robotic prostatectomy due to history of prostate cancer and he is on a few medications that can cause some issues with excess fatigue as well.  May consider stopping Xanax if his above work-up is negative to see if this improves his daytime fatigue I will defer this to his PCP.    Subjective:  HPI:  Patient here with progressive fatigue for the last month or so.  Patient has noticed symptoms of been worsening for the last several weeks.  He did have a robotic prostatectomy a few months ago due to history of prostate cancer.  He will be following up with urology soon however reports that they were able to remove the entire prostate with clean margins.  He had a PSA level drawn yesterday which has not yet returned.  He becomes fatigued easily early in the morning.  He has been able to maintain his same activity level and is able to exercise with exertion however becomes tired during the middle of the day.  He will sometimes take a nap in the day and feel refreshed in rejuvenated.  No other recent illnesses.  Feels like he is sleeping okay.  His wife has not noticed any apneic episodes though he does sometimes have light snoring.  He has a history of ADD and is on dextroamphetamine occasionally.  He does note that his fatigue symptoms improved significantly while taking the dextroamphetamine.        Objective:   Physical Exam: BP 130/82   Pulse (!) 58   Temp 98 F (36.7 C) (Temporal)   Resp 16   Ht $R'6\' 3"'zd$  (1.905 m)   Wt 236 lb 12.8 oz (107.4 kg)   SpO2 98%   BMI 29.60 kg/m   Gen: No acute distress, resting comfortably CV: Regular rate and rhythm with no murmurs appreciated Pulm: Normal work of breathing, clear to auscultation bilaterally with no crackles, wheezes, or rhonchi Neuro: Grossly normal, moves all extremities Psych: Normal affect and thought content   Time Spent: 30 minutes of total time was spent on the date of the encounter performing the following actions: chart review prior to seeing the patient including recent specialist visits, obtaining history, performing a medically necessary exam, counseling on the treatment plan, placing orders, and documenting in our EHR.       Algis Greenhouse. Jerline Pain, MD 01/27/2022 12:21 PM

## 2022-02-01 ENCOUNTER — Encounter: Payer: Self-pay | Admitting: Family Medicine

## 2022-02-01 ENCOUNTER — Telehealth: Payer: Self-pay | Admitting: Family Medicine

## 2022-02-01 NOTE — Telephone Encounter (Signed)
Patient has called back in regard to labs.  I have read patient Parkers response.  Patient understood.

## 2022-02-01 NOTE — Progress Notes (Signed)
Please inform patient of the following:  His vitamin D is low but everything else is stable.  Low vitamin D potentially could be contributing some to his issues.  Recommend he start supplementation with 1000 2000 IUs daily and have this rechecked in 3 to 6 months.  Would still be reasonable to get a sleep study at this point as well. We can refer him for sleep study if he wishes or we can defer to his PCP.

## 2022-02-01 NOTE — Telephone Encounter (Signed)
See results note. 

## 2022-02-02 DIAGNOSIS — C61 Malignant neoplasm of prostate: Secondary | ICD-10-CM | POA: Diagnosis not present

## 2022-02-02 DIAGNOSIS — N393 Stress incontinence (female) (male): Secondary | ICD-10-CM | POA: Diagnosis not present

## 2022-02-02 DIAGNOSIS — N5201 Erectile dysfunction due to arterial insufficiency: Secondary | ICD-10-CM | POA: Diagnosis not present

## 2022-02-02 NOTE — Telephone Encounter (Signed)
See results note. 

## 2022-02-07 DIAGNOSIS — F900 Attention-deficit hyperactivity disorder, predominantly inattentive type: Secondary | ICD-10-CM | POA: Diagnosis not present

## 2022-02-07 DIAGNOSIS — F411 Generalized anxiety disorder: Secondary | ICD-10-CM | POA: Diagnosis not present

## 2022-04-04 ENCOUNTER — Encounter: Payer: Self-pay | Admitting: Family Medicine

## 2022-04-05 DIAGNOSIS — H04123 Dry eye syndrome of bilateral lacrimal glands: Secondary | ICD-10-CM | POA: Diagnosis not present

## 2022-04-05 DIAGNOSIS — H5202 Hypermetropia, left eye: Secondary | ICD-10-CM | POA: Diagnosis not present

## 2022-04-05 DIAGNOSIS — Z9849 Cataract extraction status, unspecified eye: Secondary | ICD-10-CM | POA: Diagnosis not present

## 2022-04-05 DIAGNOSIS — H524 Presbyopia: Secondary | ICD-10-CM | POA: Diagnosis not present

## 2022-04-05 DIAGNOSIS — Z961 Presence of intraocular lens: Secondary | ICD-10-CM | POA: Diagnosis not present

## 2022-05-04 ENCOUNTER — Ambulatory Visit: Payer: Medicare PPO | Admitting: Neurology

## 2022-05-04 ENCOUNTER — Encounter: Payer: Self-pay | Admitting: Neurology

## 2022-05-04 ENCOUNTER — Telehealth: Payer: Self-pay | Admitting: Neurology

## 2022-05-04 VITALS — BP 128/70 | HR 58 | Ht 75.0 in | Wt 234.0 lb

## 2022-05-04 DIAGNOSIS — H5460 Unqualified visual loss, one eye, unspecified: Secondary | ICD-10-CM | POA: Diagnosis not present

## 2022-05-04 DIAGNOSIS — I639 Cerebral infarction, unspecified: Secondary | ICD-10-CM

## 2022-05-04 DIAGNOSIS — G459 Transient cerebral ischemic attack, unspecified: Secondary | ICD-10-CM

## 2022-05-04 DIAGNOSIS — R4701 Aphasia: Secondary | ICD-10-CM

## 2022-05-04 NOTE — Telephone Encounter (Signed)
CTA head/neck [employer] medic[employer] auth: [id number] exp. 05/04/22-06/03/22 sent to GI  MRI brain [facility]: [id number] exp. 05/04/22-06/03/22 sent to GI

## 2022-05-04 NOTE — Patient Instructions (Addendum)
MRI brain stroke protocol -  CTA of the head and neck  If negative for bleed asa and plavix for some time to be determined and then asa alone Echocardiogram heart with bubbles study 30-day cardiac monitor   Transient Ischemic Attack A transient ischemic attack (TIA) causes stroke-like symptoms that go away quickly. Having a TIA means that a person is at higher risk for a stroke. A TIA happens when blood supply to the brain is blocked temporarily. A TIA is a medical emergency. What are the causes? This condition is caused by a temporary blockage in an artery in the head or neck. This means the brain does not get the blood supply it needs. There is no permanent brain damage with a TIA. A blockage can be caused by: Fatty buildup in an artery in the head or neck (atherosclerosis). A blood clot. An artery tear (dissection). Inflammation of an artery (vasculitis). Sometimes the cause is not known. What increases the risk? Certain factors may make you more likely to develop this condition. Some of these are things that you can change, such as: Obesity. Using products that contain nicotine or tobacco. Taking oral birth control, especially if you also use tobacco. Not being active. Heavy alcohol use. Drug use, especially cocaine and methamphetamine. Medical conditions that may increase your risk include: High blood pressure (hypertension). High cholesterol. Diabetes. Heart disease (coronary artery disease). An irregular heartbeat, also called atrial fibrillation (AFib). Sickle cell disease. Sleep problems (sleep apnea). Chronic inflammatory diseases, such as rheumatoid arthritis or lupus. Blood clotting disorders (hypercoagulable state). Other risk factors include: Being over the age of 40. Being male. Family history of stroke. Previous history of blood clots, stroke, TIA, or heart attack. Having a history of preeclampsia. Migraine headache. What are the signs or symptoms? Symptoms  of a TIA are the same as those of a stroke. The symptoms develop suddenly, and then go away quickly. They may include: Weakness or numbness in your face, arm, or leg, especially on one side of your body. Trouble walking or moving your arms or legs. Trouble speaking, understanding speech, or both (aphasia). Vision changes, such as double vision, blurred vision, or loss of vision. Dizziness. Confusion. Loss of balance or coordination. Nausea and vomiting. Severe headache. If possible, note what time your symptoms started. Tell your health care provider. How is this diagnosed? This condition may be diagnosed based on: Your symptoms and medical history. A physical exam. Imaging tests, usually a CT scan or MRI of the brain. Blood tests. You may also have other tests, including: Electrocardiogram (ECG). Echocardiogram. Carotid ultrasound. A scan of blood circulation in the brain (CT angiogram or MR angiogram). Continuous heart monitoring. How is this treated? The goal of treatment is to reduce the risk for a stroke. Stroke prevention therapies may include: Changes to diet and lifestyle, such as being physically active and stopping smoking. Medicines to thin the blood (antiplatelets or anticoagulants). Blood pressure medicines. Medicines to reduce cholesterol. Treating other health conditions, such as diabetes or AFib. If testing shows a narrowing in the arteries to your brain, your health care provider may recommend a procedure, such as: Carotid endarterectomy. This is done to remove the blockage from your artery. Carotid angioplasty and stenting. This uses a tube (stent) to open or widen an artery in the neck. The stent helps keep the artery open by supporting the artery walls. Follow these instructions at home: Medicines Take over-the-counter and prescription medicines only as told by your health care provider.  If you were told to take a medicine to thin your blood, such as aspirin or  an anticoagulant, take it exactly as told by your health care provider. Taking too much blood-thinning medicine can cause bleeding. Taking too little will not protect you against a stroke and other problems. Eating and drinking  Eat 5 or more servings of fruits and vegetables each day. Follow guidelines from your health care provider about your diet. You may need to follow a certain diet to help manage risk factors for stroke. This may include: Eating a low-fat, low-salt diet. Choosing high-fiber foods. Limiting carbohydrates and sugar. If you drink alcohol: Limit how much you have to: 0-1 drink a day for women who are not pregnant. 0-2 drinks a day for men. Know how much alcohol is in a drink. In the [place] one drink equals one 12 oz bottle of beer (320m), one 5 oz glass of wine (1466m, or one 1 oz glass of hard liquor (4473m General instructions Maintain a healthy weight. Try to get at least 30 minutes of exercise on most days. Get treatment if you have sleep apnea. Do not use any products that contain nicotine or tobacco. These products include cigarettes, chewing tobacco, and vaping devices, such as e-cigarettes. If you need help quitting, ask your health care provider. Do not use drugs. Keep all follow-up visits. This is important. Where to find more information American Stroke Association: www.stroke.org Get help right away if: You have chest pain or an irregular heartbeat. You have any symptoms of a stroke. "BE FAST" is an easy way to remember the main warning signs of a stroke. B - Balance. Signs are dizziness, sudden trouble walking, or loss of balance. E - Eyes. Signs are trouble seeing or a sudden change in vision. F - Face. Signs are sudden weakness or numbness of the face, or the face or eyelid drooping on one side. A - Arms. Signs are weakness or numbness in an arm. This happens suddenly and usually on one side of the body. S - Speech. Signs are sudden trouble speaking,  slurred speech, or trouble understanding what people say. T - Time. Time to call emergency services. Write down what time symptoms started. You have other signs of a stroke, such as: A sudden, severe headache with no known cause. Nausea or vomiting. Seizure. These symptoms may represent a serious problem that is an emergency. Do not wait to see if the symptoms will go away. Get medical help right away. Call your local emergency services (911 in the [place] Do not drive yourself to the hospital. Summary A transient ischemic attack (TIA) happens when an artery in the head or neck is blocked. The blockage clears before there is any permanent brain damage. A TIA is a medical emergency. Symptoms of a TIA are the same as those of a stroke. The symptoms develop suddenly, and then go away quickly. Having a TIA means that you are at higher risk for a stroke. The goal of treatment is to reduce your risk for a stroke. Treatment may include medicines to thin the blood and changes to diet and lifestyle. This information is not intended to replace advice given to you by your health care provider. Make sure you discuss any questions you have with your health care provider. Document Revised: 12/15/2021 Document Reviewed: 03/10/2020 Elsevier Patient Education  [address]

## 2022-05-04 NOTE — Progress Notes (Addendum)
[facility]  Provider:  Dr Jaynee Eagles Referring Provider: Candise Che, NP Dr. Yong Channel Primary Care Physician:  Garret Reddish, MD  CC:  Abnormal white matter in the brain chronic microvascular changes  05/04/2022: 71 y.o. male here as a new request from Dr. Yong Channel for ataxia, dizziness, changes of visual perception, abnormal white matter changes of brain. 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 Thornell Mule ENT., Past medical history of K-Helix piston ossiculoplasty, malleus ro neomembrane, right tympanoplasty, incus implant, HTN, HLD, Pre-diabetes.  He has advanced for age white matter changes in the brain.He had difficulty couldn;t see peripherally out of his right eye. Just happened Monday about a week ago. This was VERY different than his migraine with aura episodes, not the same, also could not write and had monocular vision loss. No sensory changes, no weakness, no facial droop.   Last Monday had an episode of "going haywire" no recent illnesses or changes in medications. He was taken off of Aspirin. He was walking around and thinking, and went to tell his wife and for 20 minutes he felt confused, would start to say something and nothing would come out, knew what he was trying to say. He would be come frustrated trying to talk, his wife provides much information he couldn;t get the information out, he was aware, 15-20 minutes, slowly it resolved, he wrote a note "what is wrong cannot" illegible writing then "go to doctor". Slowly it resolved. Then he was fine, he was able to make short sentences then got better. He took 2 aspirin after that. He slept well and no further problems.   Patient complains of symptoms per HPI as well as the following symptoms: expressive aphasia . Pertinent negatives and positives per HPI. All others negative   Interval history Jan 13, 2021: Patient with microvascular ischemia on MRI, working with his  PCP with his hyperlipidemia trying to get it less than 70 LDL, also on aspirin 81 mg.  Recently increased his atorvastatin.  Still having ongoing dizziness and he has been extensively evaluated by ENT and neurology, possibly an orthostatic element and they discussed hydration, I also discussed bradycardia that could contribute, but episodes only occur every 4 to 6 weeks, TSH was normal in March, he still takes meloxicam for osteoarthritis, and he is being treated for hyperglycemia/prediabetes with slightly trending down A1c.  Last A1c was 5.7, 6 months ago, 3 years ago it was 6.2. His memory Is fine. The end of feb/march he had ocular migraine with the swimmy vision, aura 3 days in a row. He also had a headache after the aura. Tylenol helped. Completely reversed. He snores but he is not excessively tired, not snoring all the time, only sometimes, really does not think he has OSA, but discussed.   Patient complains of symptoms per HPI as well as the following symptoms: ocular migraines . Pertinent negatives and positives per HPI. All others negative   Interval history 04/16/2019: 71 y.o. male here as a new request from Dr. Yong Channel for ataxia, dizziness, changes of visual perception, abnormal white matter changes of brain. 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 Thornell Mule ENT., Past medical history of K-Helix piston ossiculoplasty, malleus ro neomembrane, right tympanoplasty, incus implant, HTN, HLD, Pre-diabetes.  He has advanced for age white matter changes in the brain.  He has migraines, prisms in his vision, "pixilations"in his vision,  squirly that occurred around July in the setting of bike riding, he has had this in the past as well consistent with his prior episodes, he also has difficulty talking, as far back as 2007, always the same and followed by a headache. He had this multiple times in July, usually in the right eye but occurred in the left eye.  The one in the left eye he had a headache, not excruciating, he has not had one since then, has difficulty with speech, 10 minutes usually. In the mornings when he stands up he feel dizzy briefly until he starts walking around. Likely orthostatic, discussed conservative measures. He is photosensitive, dizzy which is common in migraineurs. He used to see Dr. Thornell Mule for his hearing he has an implant in the right ear helix coil. We will recommend another opinion for his white matter changes. He denies any cognitive issues, he is active, looks much younger than stated age.   I reviewed notes from his inpatient stay November 2018, his girlfriend found him at the bedside, he had word finding difficulty lasting for 10 minutes and then started to have a headache which is similar to his previous headache with visual, aphasia or 8 to 9 years ago.  He had 2 episodes of migraine with aura one with visual normal speech.  Repeat imaging was stable.  EEG was normal.  Neurologic exam was normal.  He was diagnosed with complicated migraine less likely TIA a is all work-up negative per vascular neurologist Dr. Erlinda Hong.  He was asked to take aspirin 81 mg daily, prior to admission he was only taking it twice a week.  He was also discharged on Lipitor 20 mg daily (prior was only taking it twice a week).  Also advised ongoing aggressive stroke risk factor management.  Also addressed advanced white matter disease with a frontal predominance where is developing confluent signal abnormality, nonspecific pattern, likely from chronic microvascular disease at this age and uncontrolled risk factors.  Echocardiogram was unremarkable for features concerning for stroke risks, he did have mild pulmonary hypertension,  abnormal left ventricular relaxation grade 1 diastolic dysfunction, I do not see TEE.   I reviewed MRI of the brain images completed November 2018 which showed no significant changes.  I also reviewed CTA of the head and neck reports  which showed no acute intracranial process or significant stenosis.  Interval history 02/10/2016: 71 y.o. male here as a referral from Dr. Yong Channel for ataxia, dizziness, changes of visual perception. 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 .  He has advanced for age white matter changes in the brain. HgbA1c 6.0, LDL was elevated at 137. Today he brings in labs values since 1994 which showed he has had hyperlipidemia since then. He also had a 10 year. Of significant stress.. He has taken Adderall for many years in the past which can elevate blood pressure but trends in the clinic recently and at home have been normal. CTA of the head and neck showed no significant extracranial or intracranial stenosis of significance. Reviewed CTA of the head and neck which showed no significant atheroscleoris. He comes in with girlfriend today, reviewed MRi images and discussed findings with wife who was not here at last appointment.   CTA of the head and neck 01/18/2016: No extracranial or intracranial stenosis or occlusion of significance.   No abnormal postcontrast enhancement.  Hypoattenuation of white matter, better visualized on MR, consistent with advanced chronic microvascular ischemic change.    HPI:  Peter Novak is a 71 y.o. male here as a referral from Dr. Yong Channel for ataxia, dizziness, changes of visual perception. 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 . He denies HTN, Diabetes, HLD, smoking, alcohol use or other vascular risk factors. He had some dizziness earlier this year in March, within 15 minutes of waking up he would have some leaning to one side, spinning feeling, he would have some imbalance on  walking which would resolve. It happened for several days otherwise nothing else happened. Had several episodes. The end of April he had it for 3 days continuously, dizziness, lightheaded, no room spinning, no CP, no SOB, no weakness, no other focal neurologic deficits. No episodes since April. No FHx of autoimmune disorders, parents died in their 59s, he is generally healthy. No inciting events, no head trauma or previous illnesses. No travel overseas. Nothing made the symptoms worse or better. No history of neurologic symptoms. No family history of autoimmune disorders, neurodegenerative, diseases. No other focal neurologic deficits, no dysarthria, no dysphagia, no aphasia, no vision changes, no weakness or sensory changes. Currently he denies no gait abnormalities. He has had worsening left hearing changes with fullness in the ear. Fullness in the ear feels better with Valsalva. Denies headaches, no family history of personal history of migraines.  Reviewed notes, labs and imaging from outside physicians, which showed: Reviewed notes by Dr. Hermina Barters. Seen in the office complaining of dizziness as a rotation torque sensation. Symptoms began 3-4 months ago spells in early March. Spells occurred in the morning or in the afternoon. Wobbly. No nausea or vomiting. He does describe decreased hearing in the left ear with a feeling of constant fullness. Feels better with Valsalva. He has a water-like feeling in both ears but no fluid draining out of either ear. Denied headaches, migraine disease. Exam reviewed patient's right tympanic membrane is stable, no middle ear effusion, no sign of infection, implant appears in place, asymmetric high frequency sensorineural hearing loss, right ear greater than left. 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  MRI of the brain 12/16/2015 (personally reviewed images and agree with the following) Normal and symmetric labyrinthine signal. No  thickening or enhancement along the vestibular cochlear nerves to suggest schwannoma. No enlarged endolymphatic sac. No specific explanation for ataxia - the cerebellum and brainstem has normal size and signal. Normal appearance of the vertebral and basilar arteries.   Extensive for age white matter disease in the cerebral hemispheres with a frontal predominance where there is developing confluent signal abnormality. The pattern is nonspecific but usually from chronic microvascular disease at this age. There is no specific demyelinating pattern.   IMPRESSION: 1. No explanation for the provided history. 2. Moderate white matter disease, nonspecific pattern but usually from chronic microvascular ischemia.    Review of Systems: Patient complains of symptoms per HPI as well as the following symptoms: ringing in ears, spinning sensation, dizziness . Pertinent negatives per HPI. All others negative.   Social History   Socioeconomic History   Marital status: Married    Spouse name: Not on file   Number of children: Not on file   Years of education: Not on file   Highest education level: Not on file  Occupational History   Not on file  Tobacco Use   Smoking status: Never   Smokeless tobacco: Never  Vaping Use   Vaping Use: Never used  Substance and Sexual Activity   Alcohol use: Yes    Alcohol/week: 1.0 - 2.0 standard drink of alcohol    Types: 1 - 2 Standard drinks or equivalent per week    Comment: social only    Drug use: No   Sexual activity: Not on file  Other Topics Concern   Not on file  Social History Narrative   Peter Novak 2021. Married 2006, divorced 2010. 2 children (35 and 50 in 2016 in good health), no grandkids.    [employer] grad.       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       Hobbies: place in [place] in [place], outdoor activities, reading, follow things on PC      Right handed      Lives at home with  wife and dog       Caffeine: 2 cups/day   Social Determinants of Health   Financial Resource Strain: Not on file  Food Insecurity: Not on file  Transportation Needs: Not on file  Physical Activity: Not on file  Stress: Not on file  Social Connections: Not on file  Intimate Partner Violence: Not on file    Family History  Problem Relation Age of Onset   Diverticulosis Mother    Diabetes Father        AAA   Sudden death Brother 54       CAD; MI @ 22, smoker, sedentary, overweight, diabetes, HLD   Diabetes Brother    Hyperlipidemia Brother    Cancer Paternal Grandmother 53       unknown type   Heart attack Paternal Grandfather 36   Colon cancer Maternal Aunt        dx. 6s   Breast cancer Cousin 60   Stroke Neg Hx    Neuropathy Neg Hx    Multiple sclerosis Neg Hx    Migraines Neg Hx    Dementia Neg Hx     Past Medical History:  Diagnosis Date   ADD (attention deficit disorder with hyperactivity)    Arthritis    Cancer ([place])    prostate cancer   Complication of anesthesia    Constipation    GERD (gastroesophageal reflux disease)    Hearing loss    History of skin cancer    Melanoma X 2   Hyperlipidemia    Lyme disease 01/2021    Past Surgical History:  Procedure Laterality Date   CATARACT EXTRACTION, BILATERAL  March 2022 and April 2022   CHOLECYSTECTOMY  2005   COLONOSCOPY  2008   negative   ELBOW SURGERY  1994   HERNIA REPAIR  2005   INNER EAR SURGERY  2006   Incus transposition;oscillculoplasty; Dr Thereasa Parkin EAR SURGERY  2011   Stirrup resection   LYMPHADENECTOMY Bilateral 11/04/2021   Procedure: LYMPHADENECTOMY, PELVIC;  Surgeon: Raynelle Bring, MD;  Location: [facility];  Service: Urology;  Laterality: Bilateral;   ROBOT ASSISTED LAPAROSCOPIC RADICAL PROSTATECTOMY N/A 11/04/2021   Procedure: XI ROBOTIC ASSISTED LAPAROSCOPIC RADICAL PROSTATECTOMY LEVEL 3;  Surgeon: Raynelle Bring, MD;  Location: [facility];  Service: Urology;  Laterality: N/A;   SHOULDER  ARTHROSCOPY  2004   Dr Mayer Camel   TONSILLECTOMY AND ADENOIDECTOMY     VASECTOMY  Current Outpatient Medications  Medication Sig Dispense Refill   acetaminophen (TYLENOL) 325 MG tablet Take 650 mg by mouth every 6 (six) hours as needed for moderate pain.     ALPRAZolam (XANAX) 0.5 MG tablet Take 0.25 mg by mouth at bedtime.     atorvastatin (LIPITOR) 20 MG tablet TAKE ONE TABLET BY MOUTH DAILY 90 tablet 3   Chlorpheniramine Maleate (ALLERGY RELIEF PO) Take 2 mg by mouth daily as needed (allergies).     dextroamphetamine (DEXTROSTAT) 5 MG tablet Take 5 mg by mouth daily as needed.     esomeprazole (NEXIUM) 20 MG capsule Take 1 capsule (20 mg total) by mouth daily. 90 capsule 2   polyethylene glycol (MIRALAX / GLYCOLAX) 17 g packet Take 17 g by mouth daily.     traMADol (ULTRAM) 50 MG tablet Take 1-2 tablets (50-100 mg total) by mouth every 6 (six) hours as needed for moderate pain or severe pain. 20 tablet 0   No current facility-administered medications for this visit.    Allergies as of 05/04/2022   (No Known Allergies)    Vitals: BP 128/70 (BP Location: Left Arm, Patient Position: Sitting)   Pulse (!) 58   Ht '6\' 3"'$  (1.905 m)   Wt 234 lb (106.1 kg)   BMI 29.25 kg/m  Last Weight:  Wt Readings from Last 1 Encounters:  05/04/22 234 lb (106.1 kg)   Last Height:   Ht Readings from Last 1 Encounters:  05/04/22 '6\' 3"'$  (1.905 m)   Physical exam: Exam: Gen: NAD, conversant, well nourised, obese, well groomed                     CV: RRR, no MRG. No Carotid Bruits. No peripheral edema, warm, nontender Eyes: Conjunctivae clear without exudates or hemorrhage  Neuro: Detailed Neurologic Exam  Speech:    Speech is normal; fluent and spontaneous with normal comprehension.  Cognition:    The patient is oriented to person, place, and time;     recent and remote memory intact;     language fluent;     normal attention, concentration,     fund of knowledge Cranial Nerves:    The  pupils are equal, round, and reactive to light. The fundi are normal and spontaneous venous pulsations are present. Visual fields are full to finger confrontation. Extraocular movements are intact. Trigeminal sensation is intact and the muscles of mastication are normal. The face is symmetric. The palate elevates in the midline. Hearing intact. Voice is normal. Shoulder shrug is normal. The tongue has normal motion without fasciculations.   Coordination:    Normal finger to nose and heel to shin. Normal rapid alternating movements.   Gait:    Heel-toe and tandem gait are normal.   Motor Observation:    No asymmetry, no atrophy, and no involuntary movements noted. Tone:    Normal muscle tone.    Posture:    Posture is normal. normal erect    Strength:    Strength is V/V in the upper and lower limbs.      Sensation: intact to LT     Reflex Exam:  DTR's:    Deep tendon reflexes in the upper and lower extremities are normal bilaterally.   Toes:    The toes are downgoing bilaterally.   Clonus:    Clonus is absent.      Assessment/Plan:  71 y.o. 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  - acute episode of expressive aphasia, right monocular vision loss, inability to write, very suspicious for embolic stroke/ TIA need stroke workup asap, has multiple vascular risk factors:  MRI brain stroke protocol -  CTA of the head and neck  If negative for bleed asa and plavix for some time to be determined and then asa alone Echocardiogram heart with bubbles study 30-day cardiac monitor  - MRI of the brain 2018 and  2020 showed stable advanced-for-age white matter disease in the cerebral hemispheres with a frontal and left hemispheric predominance where there is developing confluent signal abnormality. The pattern is nonspecific but usually from chronic microvascular disease at this age. He is managing his HLD,obesity,  pre-diabetes. High blood pressure over certain number of years possibly in the setting of Adderall may have contributed as well.  - Denies any symptoms of OSA or cognitive decline.  - CTA of the head and neck 2017 showed No extracranial or intracranial stenosis or occlusion of significance. Repeat 2018 inpatient also did not show large vessel disease   -  Stopped daily ASA 81 mg for stroke prevention, will restart DUAP for some time if no bleed and then asa '81mg'$  for life  - cont managing risk factors  I had a long d/w patient about increased risk for stroke/TIAs, personally independently reviewed imaging studies and stroke evaluation results and answered questions.Continue ASA daily for stroke prevention and maintain strict control of hypertension with blood pressure goal below 130/90, diabetes with hemoglobin A1c goal below 6.5% and lipids with LDL cholesterol goal below 70 mg/dL.I also advised the patient to eat a healthy diet with plenty of whole grains, cereals, fruits and vegetables, exercise regularly and maintain ideal body weight .  Orders Placed This Encounter  Procedures   MR BRAIN WO CONTRAST   CT ANGIO HEAD W OR WO CONTRAST   CT ANGIO NECK W OR WO CONTRAST   HOLTER MONITOR - 48 HOUR   ECHOCARDIOGRAM COMPLETE BUBBLE STUDY    Followup in the future with me 3 months  Cc: Candise Che, NP Dr. Milus Banister, MD  [facility] [address] [place], [place] [address]  Phone [phone] Fax [phone]  I spent 40  minutes of face-to-face and non-face-to-face time with patient on the  1. Acute embolic stroke ([place])   2. Expressive  aphasia   3. Monocular vision loss     diagnosis.  This included previsit chart review, lab review, study review, order entry, electronic health record documentation, patient education on the different diagnostic and therapeutic options, counseling and coordination of care, risks and benefits of management, compliance, or risk factor reduction

## 2022-05-08 NOTE — Addendum Note (Signed)
Addended by: Sarina Ill B on: 05/08/2022 08:14 PM   Modules accepted: Orders

## 2022-05-09 ENCOUNTER — Ambulatory Visit
Admission: RE | Admit: 2022-05-09 | Discharge: 2022-05-09 | Disposition: A | Discharge status: Home / Self Care | Payer: Medicare PPO | Source: Ambulatory Visit | Attending: Neurology | Admitting: Neurology

## 2022-05-09 ENCOUNTER — Other Ambulatory Visit: Payer: Self-pay | Admitting: Neurology

## 2022-05-09 DIAGNOSIS — H5460 Unqualified visual loss, one eye, unspecified: Secondary | ICD-10-CM

## 2022-05-09 DIAGNOSIS — R4701 Aphasia: Secondary | ICD-10-CM

## 2022-05-09 DIAGNOSIS — K110 Atrophy of salivary gland: Secondary | ICD-10-CM | POA: Diagnosis not present

## 2022-05-09 DIAGNOSIS — I639 Cerebral infarction, unspecified: Secondary | ICD-10-CM

## 2022-05-09 DIAGNOSIS — I4891 Unspecified atrial fibrillation: Secondary | ICD-10-CM

## 2022-05-09 DIAGNOSIS — H547 Unspecified visual loss: Secondary | ICD-10-CM | POA: Diagnosis not present

## 2022-05-09 DIAGNOSIS — I69320 Aphasia following cerebral infarction: Secondary | ICD-10-CM | POA: Diagnosis not present

## 2022-05-09 MED ORDER — IOPAMIDOL (ISOVUE-370) INJECTION 76%
75.0000 mL | Freq: Once | INTRAVENOUS | Status: AC | PRN
Start: 2022-05-09 — End: 2022-05-09
  Administered 2022-05-09: 75 mL via INTRAVENOUS

## 2022-05-10 ENCOUNTER — Ambulatory Visit
Admission: RE | Admit: 2022-05-10 | Discharge: 2022-05-10 | Disposition: A | Discharge status: Home / Self Care | Payer: Medicare PPO | Source: Ambulatory Visit | Attending: Neurology | Admitting: Neurology

## 2022-05-10 ENCOUNTER — Other Ambulatory Visit: Payer: Self-pay | Admitting: Neurology

## 2022-05-10 ENCOUNTER — Telehealth: Payer: Self-pay | Admitting: Neurology

## 2022-05-10 DIAGNOSIS — H5460 Unqualified visual loss, one eye, unspecified: Secondary | ICD-10-CM

## 2022-05-10 DIAGNOSIS — R4701 Aphasia: Secondary | ICD-10-CM | POA: Diagnosis not present

## 2022-05-10 DIAGNOSIS — I639 Cerebral infarction, unspecified: Secondary | ICD-10-CM

## 2022-05-10 DIAGNOSIS — G459 Transient cerebral ischemic attack, unspecified: Secondary | ICD-10-CM

## 2022-05-10 MED ORDER — ASPIRIN 81 MG PO TBEC: 81.0000 mg | DELAYED_RELEASE_TABLET | Freq: Every day | ORAL | 12 refills | Status: AC

## 2022-05-10 MED ORDER — CLOPIDOGREL BISULFATE 75 MG PO TABS: 75.0000 mg | ORAL_TABLET | Freq: Every day | ORAL | 0 refills | Status: DC

## 2022-05-10 NOTE — Telephone Encounter (Signed)
No bleeding and the large blood vessels have no significant blockages which is great news. We will have to wait for the mri to see if the white mater changes have progressed. Since it sounds like he had a TIA I would like him to start aspirin '81mg'$  and Plavix '75mg'$  for 3 weeks then stop the Plavix and conytnue the asa '81mg'$  for life see phone note thanks

## 2022-05-11 NOTE — Telephone Encounter (Signed)
I called pt and relayed the results of the CTA neck/head.  MRI pending results.  He had already picked up the plavix  he will take aspirin '81mg'$  po daily and  plavix '75mg'$  po daily for 3 weeks and then only aspirin '81mg'$  daily thereafter.  He asked about labs ( if needed , was not fasting at appt when in to see you.  Last lipid panel was 05-2021 with pcp in Epic. [name] Kitchen  Please advise.   Will received cardiac monitor in 5-7 days.  (Instructions included if any questions will call cardiology or whomever made arrangements for the CEM.  He verbalized understanding.

## 2022-05-16 ENCOUNTER — Telehealth: Payer: Self-pay | Admitting: Neurology

## 2022-05-16 NOTE — Telephone Encounter (Signed)
Peter Novak: [id number] 05/16/22-08/14/22 for [facility]

## 2022-05-17 ENCOUNTER — Ambulatory Visit: Payer: Medicare PPO | Attending: Neurology

## 2022-05-17 DIAGNOSIS — I4891 Unspecified atrial fibrillation: Secondary | ICD-10-CM | POA: Diagnosis not present

## 2022-05-17 DIAGNOSIS — H5460 Unqualified visual loss, one eye, unspecified: Secondary | ICD-10-CM

## 2022-05-17 DIAGNOSIS — I639 Cerebral infarction, unspecified: Secondary | ICD-10-CM | POA: Diagnosis not present

## 2022-05-17 DIAGNOSIS — R4701 Aphasia: Secondary | ICD-10-CM | POA: Diagnosis not present

## 2022-05-23 ENCOUNTER — Encounter: Payer: Self-pay | Admitting: *Deleted

## 2022-05-26 ENCOUNTER — Ambulatory Visit (HOSPITAL_COMMUNITY): Payer: Medicare PPO | Attending: Neurology

## 2022-05-26 DIAGNOSIS — G459 Transient cerebral ischemic attack, unspecified: Secondary | ICD-10-CM | POA: Diagnosis not present

## 2022-05-26 DIAGNOSIS — H5460 Unqualified visual loss, one eye, unspecified: Secondary | ICD-10-CM | POA: Diagnosis not present

## 2022-05-26 DIAGNOSIS — R4701 Aphasia: Secondary | ICD-10-CM | POA: Diagnosis not present

## 2022-05-26 DIAGNOSIS — I6389 Other cerebral infarction: Secondary | ICD-10-CM | POA: Diagnosis not present

## 2022-05-26 DIAGNOSIS — I639 Cerebral infarction, unspecified: Secondary | ICD-10-CM | POA: Insufficient documentation

## 2022-05-26 LAB — ECHOCARDIOGRAM COMPLETE BUBBLE STUDY
Area-P 1/2: 3.24 cm2
S' Lateral: 2.8 cm

## 2022-05-30 ENCOUNTER — Encounter: Payer: Self-pay | Admitting: Gastroenterology

## 2022-05-31 ENCOUNTER — Encounter: Payer: Self-pay | Admitting: Neurology

## 2022-06-14 ENCOUNTER — Telehealth: Payer: Self-pay | Admitting: *Deleted

## 2022-06-14 NOTE — Telephone Encounter (Signed)
Dr. Silverio Decamp, This pt is scheduled for a recall colonoscopy on 07-08-22 and was added on for a PV tomorrow.  Could you review her chart? He was recently seen by neurology for a possible stroke- MRI on 05-10-22 showed no changes from 05-18-19.  He was on Plavix for three weeks but in on ASA 81 mg now.   Also, he was treated for prostate CA within the past year.  Per our PV protocol we are to make sure MD is ok proceeding.  Please advise.  Thank you, Cyril Mourning

## 2022-06-15 ENCOUNTER — Ambulatory Visit (AMBULATORY_SURGERY_CENTER): Payer: Self-pay | Admitting: *Deleted

## 2022-06-15 VITALS — Ht 75.0 in | Wt 240.0 lb

## 2022-06-15 DIAGNOSIS — Z86010 Personal history of colonic polyps: Secondary | ICD-10-CM

## 2022-06-15 MED ORDER — PEG 3350-KCL-NA BICARB-NACL 420 G PO SOLR: 4000.0000 mL | Freq: Once | ORAL | 0 refills | Status: AC

## 2022-06-15 NOTE — Telephone Encounter (Signed)
Review of chart, per neurology note there is concern for acute TIA or embolic stroke but MRI brain was negative.  Okay to proceed with colonoscopy as scheduled.  Thank you

## 2022-06-15 NOTE — Telephone Encounter (Signed)
noted 

## 2022-06-15 NOTE — Telephone Encounter (Signed)
Kristen,  This pt is cleared for anesthetic care at [facility].  Thanks,  Dakin Madani 

## 2022-06-15 NOTE — Progress Notes (Signed)

## 2022-06-29 ENCOUNTER — Encounter: Payer: Self-pay | Admitting: Gastroenterology

## 2022-07-03 ENCOUNTER — Encounter: Payer: Self-pay | Admitting: Certified Registered Nurse Anesthetist

## 2022-07-08 ENCOUNTER — Ambulatory Visit (AMBULATORY_SURGERY_CENTER): Payer: Medicare PPO | Admitting: Gastroenterology

## 2022-07-08 ENCOUNTER — Encounter: Payer: Self-pay | Admitting: Gastroenterology

## 2022-07-08 VITALS — BP 159/78 | HR 47 | Temp 98.4°F | Resp 15 | Ht 75.0 in | Wt 240.0 lb

## 2022-07-08 DIAGNOSIS — K635 Polyp of colon: Secondary | ICD-10-CM | POA: Diagnosis not present

## 2022-07-08 DIAGNOSIS — Z86010 Personal history of colonic polyps: Secondary | ICD-10-CM

## 2022-07-08 DIAGNOSIS — D123 Benign neoplasm of transverse colon: Secondary | ICD-10-CM

## 2022-07-08 DIAGNOSIS — Z09 Encounter for follow-up examination after completed treatment for conditions other than malignant neoplasm: Secondary | ICD-10-CM

## 2022-07-08 MED ORDER — SODIUM CHLORIDE 0.9 % IV SOLN: 500.0000 mL | INTRAVENOUS | Status: DC

## 2022-07-08 NOTE — Progress Notes (Unsigned)
Called to room to assist during endoscopic procedure.  Patient ID and intended procedure confirmed with present staff. Received instructions for my participation in the procedure from the performing physician.  

## 2022-07-08 NOTE — Progress Notes (Unsigned)
[facility] History and Physical   Primary Care Physician:  Marin Olp, MD   Reason for Procedure:  History of adenomatous colon polyps  Plan:    Surveillance colonoscopy with possible interventions as needed     HPI: Peter Novak is a very pleasant 71 y.o. male here for surveillance colonoscopy. Denies any nausea, vomiting, abdominal pain, melena or bright red blood per rectum  The risks and benefits as well as alternatives of endoscopic procedure(s) have been discussed and reviewed. All questions answered. The patient agrees to proceed.    Past Medical History:  Diagnosis Date   ADD (attention deficit disorder with hyperactivity)    Allergy    seasonal   Arthritis    Cancer ([place])    prostate cancer   Cataract    bilateral,removed   Complication of anesthesia    Constipation    GERD (gastroesophageal reflux disease)    Hearing loss    History of skin cancer    Melanoma X 2   Hyperlipidemia    Lyme disease 01/2021    Past Surgical History:  Procedure Laterality Date   CATARACT EXTRACTION, BILATERAL  March 2022 and April 2022   CHOLECYSTECTOMY  2005   COLONOSCOPY  2008   negative   ELBOW SURGERY  1994   HERNIA REPAIR  2005   INNER EAR SURGERY  2006   Incus transposition;oscillculoplasty; Dr Thereasa Parkin EAR SURGERY  2011   Stirrup resection   LYMPHADENECTOMY Bilateral 11/04/2021   Procedure: LYMPHADENECTOMY, PELVIC;  Surgeon: Raynelle Bring, MD;  Location: [facility];  Service: Urology;  Laterality: Bilateral;   POLYPECTOMY     ROBOT ASSISTED LAPAROSCOPIC RADICAL PROSTATECTOMY N/A 11/04/2021   Procedure: XI ROBOTIC ASSISTED LAPAROSCOPIC RADICAL PROSTATECTOMY LEVEL 3;  Surgeon: Raynelle Bring, MD;  Location: [facility];  Service: Urology;  Laterality: N/A;   SHOULDER ARTHROSCOPY  2004   Dr Mayer Camel   TONSILLECTOMY AND ADENOIDECTOMY     VASECTOMY      Prior to Admission medications   Medication Sig Start Date End Date Taking? Authorizing  Provider  acetaminophen (TYLENOL) 325 MG tablet Take 650 mg by mouth every 6 (six) hours as needed for moderate pain.   Yes [provider]  ALPRAZolam Duanne Moron) 0.5 MG tablet Take 0.25 mg by mouth at bedtime.   Yes [provider]  Chlorpheniramine Maleate (ALLERGY RELIEF PO) Take 2 mg by mouth daily as needed (allergies).   Yes [provider]  esomeprazole (NEXIUM) 20 MG capsule Take 1 capsule (20 mg total) by mouth daily. 11/05/19  Yes Marin Olp, MD  polyethylene glycol (MIRALAX / GLYCOLAX) 17 g packet Take 17 g by mouth as needed.   Yes [provider]  aspirin EC 81 MG tablet Take 1 tablet (81 mg total) by mouth daily. Swallow whole. 05/10/22   Melvenia Beam, MD  atorvastatin (LIPITOR) 20 MG tablet TAKE ONE TABLET BY MOUTH DAILY 10/19/21   Marin Olp, MD  Cholecalciferol (VITAMIN D3 PO) Take by mouth daily.    [provider]  clopidogrel (PLAVIX) 75 MG tablet Take 1 tablet (75 mg total) by mouth daily. 05/10/22   Melvenia Beam, MD  Cyanocobalamin (VITAMIN B-12 SL) Place under the tongue daily.    [provider]  dextroamphetamine (DEXTROSTAT) 5 MG tablet Take 5 mg by mouth daily as needed. Patient not taking: Reported on 06/15/2022 11/19/21   [provider]  Multiple Vitamin (MULTIVITAMIN ADULT PO) Take by mouth  daily.    [provider]  traMADol (ULTRAM) 50 MG tablet Take 1-2 tablets (50-100 mg total) by mouth every 6 (six) hours as needed for moderate pain or severe pain. Patient not taking: Reported on 06/15/2022 11/04/21   Debbrah Alar, PA-C    Current Outpatient Medications  Medication Sig Dispense Refill   acetaminophen (TYLENOL) 325 MG tablet Take 650 mg by mouth every 6 (six) hours as needed for moderate pain.     ALPRAZolam (XANAX) 0.5 MG tablet Take 0.25 mg by mouth at bedtime.     Chlorpheniramine Maleate (ALLERGY RELIEF PO) Take 2 mg by mouth daily as needed (allergies).     esomeprazole  (NEXIUM) 20 MG capsule Take 1 capsule (20 mg total) by mouth daily. 90 capsule 2   polyethylene glycol (MIRALAX / GLYCOLAX) 17 g packet Take 17 g by mouth as needed.     aspirin EC 81 MG tablet Take 1 tablet (81 mg total) by mouth daily. Swallow whole. 30 tablet 12   atorvastatin (LIPITOR) 20 MG tablet TAKE ONE TABLET BY MOUTH DAILY 90 tablet 3   Cholecalciferol (VITAMIN D3 PO) Take by mouth daily.     clopidogrel (PLAVIX) 75 MG tablet Take 1 tablet (75 mg total) by mouth daily. 21 tablet 0   Cyanocobalamin (VITAMIN B-12 SL) Place under the tongue daily.     dextroamphetamine (DEXTROSTAT) 5 MG tablet Take 5 mg by mouth daily as needed. (Patient not taking: Reported on 06/15/2022)     Multiple Vitamin (MULTIVITAMIN ADULT PO) Take by mouth daily.     traMADol (ULTRAM) 50 MG tablet Take 1-2 tablets (50-100 mg total) by mouth every 6 (six) hours as needed for moderate pain or severe pain. (Patient not taking: Reported on 06/15/2022) 20 tablet 0   Current Facility-Administered Medications  Medication Dose Route Frequency Provider Last Rate Last Admin   0.9 %  sodium chloride infusion  500 mL Intravenous Continuous Juliene Kirsh, Venia Minks, MD        Allergies as of 07/08/2022   (No Known Allergies)    Family History  Problem Relation Age of Onset   Diverticulosis Mother    Diabetes Father        AAA   Sudden death Brother 76       CAD; MI @ 11, smoker, sedentary, overweight, diabetes, HLD   Diabetes Brother    Hyperlipidemia Brother    Colon cancer Maternal Aunt        dx. 67s   Cancer Paternal Grandmother 49       unknown type   Heart attack Paternal Grandfather 66   Breast cancer Cousin 73   Stroke Neg Hx    Neuropathy Neg Hx    Multiple sclerosis Neg Hx    Migraines Neg Hx    Dementia Neg Hx    Colon polyps Neg Hx    Crohn's disease Neg Hx    Esophageal cancer Neg Hx    Rectal cancer Neg Hx    Stomach cancer Neg Hx    Ulcerative colitis Neg Hx     Social History    Socioeconomic History   Marital status: Married    Spouse name: Not on file   Number of children: Not on file   Years of education: Not on file   Highest education level: Not on file  Occupational History   Not on file  Tobacco Use   Smoking status: Never    Passive exposure: Never   Smokeless tobacco:  Never  Vaping Use   Vaping Use: Never used  Substance and Sexual Activity   Alcohol use: Yes    Alcohol/week: 1.0 - 2.0 standard drink of alcohol    Types: 1 - 2 Standard drinks or equivalent per week    Comment: social only    Drug use: No   Sexual activity: Not on file  Other Topics Concern   Not on file  Social History Narrative   Mosie Epstein 2021. Married 2006, divorced 2010. 2 children (35 and 56 in 2016 in good health), no grandkids.    [employer] grad.       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       Hobbies: place in [place] in [place], outdoor activities, reading, follow things on PC      Right handed      Lives at home with wife and dog       Caffeine: 2 cups/day   Social Determinants of Health   Financial Resource Strain: Not on file  Food Insecurity: Not on file  Transportation Needs: Not on file  Physical Activity: Not on file  Stress: Not on file  Social Connections: Not on file  Intimate Partner Violence: Not on file    Review of Systems:  All other review of systems negative except as mentioned in the HPI.  Physical Exam: Vital signs in last 24 hours: Blood Pressure (Abnormal) 148/79   Pulse (Abnormal) 50   Temperature 98.4 F (36.9 C)   Height '6\' 3"'$  (1.905 m)   Weight 240 lb (108.9 kg)   Oxygen Saturation 100%   Body Mass Index 30.00 kg/m  General:   Alert, NAD Lungs:  Clear .   Heart:  Regular rate and rhythm Abdomen:  Soft, nontender and nondistended. Neuro/Psych:  Alert and cooperative. Normal mood and affect. A and O x 3  Reviewed labs, radiology imaging, old records and  pertinent past GI work up  Patient is appropriate for planned procedure(s) and anesthesia in an ambulatory setting   K. Denzil Magnuson , MD [phone]

## 2022-07-08 NOTE — Progress Notes (Unsigned)
Report given to PACU, vss 

## 2022-07-08 NOTE — Op Note (Signed)
[facility] Patient Name: Peter Novak Procedure Date: 07/08/2022 11:21 AM MRN: [id number] Endoscopist: Mauri Pole , MD, [id number] Age: 71 Referring MD:  Date of Birth: 1951-01-04 Gender: Male Account #: [id number] Procedure:                Colonoscopy Indications:              High risk colon cancer surveillance: Personal                            history of colonic polyps, High risk colon cancer                            surveillance: Personal history of adenoma less than                            10 mm in size Medicines:                Monitored Anesthesia Care Procedure:                Pre-Anesthesia Assessment:                           - Prior to the procedure, a History and Physical                            was performed, and patient medications and                            allergies were reviewed. The patient's tolerance of                            previous anesthesia was also reviewed. The risks                            and benefits of the procedure and the sedation                            options and risks were discussed with the patient.                            All questions were answered, and informed consent                            was obtained. Prior Anticoagulants: The patient has                            taken no anticoagulant or antiplatelet agents. ASA                            Grade Assessment: II - A patient with mild systemic                            disease. After reviewing the risks and benefits,  the patient was deemed in satisfactory condition to                            undergo the procedure.                           After obtaining informed consent, the colonoscope                            was passed under direct vision. Throughout the                            procedure, the patient's blood pressure, pulse, and                            oxygen saturations were monitored  continuously. The                            Olympus PCF-H190DL ([id number]) Colonoscope was                            introduced through the anus and advanced to the the                            cecum, identified by appendiceal orifice and                            ileocecal valve. The colonoscopy was performed                            without difficulty. The patient tolerated the                            procedure well. The quality of the bowel                            preparation was good. The ileocecal valve,                            appendiceal orifice, and rectum were photographed. Scope In: 11:30:44 AM Scope Out: 11:52:29 AM Scope Withdrawal Time: 0 hours 18 minutes 23 seconds  Total Procedure Duration: 0 hours 21 minutes 45 seconds  Findings:                 The perianal and digital rectal examinations were                            normal.                           A 4 mm polyp was found in the transverse colon. The                            polyp was sessile. The polyp was removed with a  cold snare. Resection and retrieval were complete.                           Scattered small-mouthed diverticula were found in                            the sigmoid colon, descending colon, transverse                            colon and ascending colon.                           Non-bleeding external and internal hemorrhoids were                            found during retroflexion. The hemorrhoids were                            medium-sized. Complications:            No immediate complications. Estimated Blood Loss:     Estimated blood loss was minimal. Impression:               - One 4 mm polyp in the transverse colon, removed                            with a cold snare. Resected and retrieved.                           - Diverticulosis in the sigmoid colon, in the                            descending colon, in the transverse colon and in                             the ascending colon.                           - Non-bleeding external and internal hemorrhoids. Recommendation:           - Patient has a contact number available for                            emergencies. The signs and symptoms of potential                            delayed complications were discussed with the                            patient. Return to normal activities tomorrow.                            Written discharge instructions were provided to the                            patient.                           -  Resume previous diet.                           - Continue present medications.                           - Await pathology results.                           - No repeat colonoscopy due to age. Mauri Pole, MD 07/08/2022 11:59:09 AM This report has been signed electronically.

## 2022-07-08 NOTE — Patient Instructions (Signed)
Handouts provided about diverticulosis, hemorrhoids and polyps.  Resume previous diet,  Continue present medications.  Await pathology results.  No repeat colonoscopy due to age.   YOU HAD AN ENDOSCOPIC PROCEDURE TODAY AT [facility]:   Refer to the procedure report that was given to you for any specific questions about what was found during the examination.  If the procedure report does not answer your questions, please call your gastroenterologist to clarify.  If you requested that your care partner not be given the details of your procedure findings, then the procedure report has been included in a sealed envelope for you to review at your convenience later.  YOU SHOULD EXPECT: Some feelings of bloating in the abdomen. Passage of more gas than usual.  Walking can help get rid of the air that was put into your GI tract during the procedure and reduce the bloating. If you had a lower endoscopy (such as a colonoscopy or flexible sigmoidoscopy) you may notice spotting of blood in your stool or on the toilet paper. If you underwent a bowel prep for your procedure, you may not have a normal bowel movement for a few days.  Please Note:  You might notice some irritation and congestion in your nose or some drainage.  This is from the oxygen used during your procedure.  There is no need for concern and it should clear up in a day or so.  SYMPTOMS TO REPORT IMMEDIATELY:  Following lower endoscopy (colonoscopy or flexible sigmoidoscopy):  Excessive amounts of blood in the stool  Significant tenderness or worsening of abdominal pains  Swelling of the abdomen that is new, acute  Fever of 100F or higher  For urgent or emergent issues, a gastroenterologist can be reached at any hour by calling [phone]. Do not use MyChart messaging for urgent concerns.    DIET:  We do recommend a small meal at first, but then you may proceed to your regular diet.  Drink plenty of fluids but you should  avoid alcoholic beverages for 24 hours.  ACTIVITY:  You should plan to take it easy for the rest of today and you should NOT DRIVE or use heavy machinery until tomorrow (because of the sedation medicines used during the test).    FOLLOW UP: Our staff will call the number listed on your records the next business day following your procedure.  We will call around 7:15- 8:00 am to check on you and address any questions or concerns that you may have regarding the information given to you following your procedure. If we do not reach you, we will leave a message.     If any biopsies were taken you will be contacted by phone or by letter within the next 1-3 weeks.  Please call us at [phone] if you have not heard about the biopsies in 3 weeks.    SIGNATURES/CONFIDENTIALITY: You and/or your care partner have signed paperwork which will be entered into your electronic medical record.  These signatures attest to the fact that that the information above on your After Visit Summary has been reviewed and is understood.  Full responsibility of the confidentiality of this discharge information lies with you and/or your care-partner.

## 2022-07-11 ENCOUNTER — Telehealth: Payer: Self-pay

## 2022-07-11 ENCOUNTER — Encounter: Payer: Self-pay | Admitting: Gastroenterology

## 2022-07-11 NOTE — Telephone Encounter (Signed)
  Follow up Call-     07/08/2022   10:18 AM  Call back number  Post procedure Call Back phone  # [phone]  Permission to leave phone message Yes     Patient questions:  Do you have a fever, pain , or abdominal swelling? No. Pain Score  0 *  Have you tolerated food without any problems? Yes.    Have you been able to return to your normal activities? Yes.    Do you have any questions about your discharge instructions: Diet   No. Medications  No. Follow up visit  No.  Do you have questions or concerns about your Care? Yes.  Pt. Reports he has had a lot of gas over the weekend.  He has been able to eat, is watching his food choices.  Wondered if he could take Gas Ex.  Told pt. That would be fine, to walk, drink hot drinks, if he has discomfort, to get on hands and knees to assist with expelling of air.   Told pt. To call if he has any further questions or concerns.  Actions: * If pain score is 4 or above: No action needed, pain <4.

## 2022-07-26 NOTE — Progress Notes (Unsigned)
error 

## 2022-08-02 ENCOUNTER — Encounter: Payer: Self-pay | Admitting: Gastroenterology

## 2022-08-04 DIAGNOSIS — F411 Generalized anxiety disorder: Secondary | ICD-10-CM | POA: Diagnosis not present

## 2022-08-04 DIAGNOSIS — F900 Attention-deficit hyperactivity disorder, predominantly inattentive type: Secondary | ICD-10-CM | POA: Diagnosis not present

## 2022-08-09 ENCOUNTER — Encounter: Payer: Self-pay | Admitting: Neurology

## 2022-08-09 ENCOUNTER — Ambulatory Visit: Payer: Medicare PPO | Admitting: Neurology

## 2022-08-09 VITALS — BP 110/61 | HR 66 | Ht 74.0 in | Wt 241.6 lb

## 2022-08-09 DIAGNOSIS — G459 Transient cerebral ischemic attack, unspecified: Secondary | ICD-10-CM | POA: Diagnosis not present

## 2022-08-09 NOTE — Progress Notes (Signed)
[facility]  Provider:  Dr Jaynee Eagles Referring Provider: Candise Che, NP Dr. Yong Channel Primary Care Physician:  Garret Reddish, MD  CC:  Abnormal white matter in the brain chronic microvascular changes  08/09/2022: Reviwed work up with patient: had stroke workup, likely TIA, was not on ASA now on ASA daily(stopped plavix), Overall echocardiogram looks good, nothing concerning from a stroke or neurology perspective. He has some grade 1 diastolic dysfunction and trivial valve regurg. FYI thanks [name] (Have a great weekend) , cardiac monitoring 30 days did not show afib, CTA did not show any concern for large vessel stenosis, MRI brain w/o stroke stable white matter changes. If happens again, call 911, then consider loop recorder he wants to wait on the loop recorder.  No significant snoring, wakes 2-3x to urinate, he will nap in the daytime, feels refereshed. ESS 5 no sleep apnea  05/17/2022: cardiac monitor 30 days Conclusions: No atrial fibrillation  MRI brain: STUDY DATE: 05/10/22 PATIENT NAME: Peter Novak DOB: 21-Nov-1950 MRN: [id number]   ORDERING CLINICIAN: Melvenia Beam, MD  CLINICAL HISTORY: 71 y.o. year old male with: 1. Expressive aphasia   2. Acute embolic stroke ([place])   3. Monocular vision loss       EXAM: MR BRAIN WO CONTRAST  TECHNIQUE: MRI of the brain without contrast was obtained utilizing multiplanar, multiecho pulse sequences. CONTRAST: none COMPARISON: 05/18/19 MRI   IMAGING SITE: [facility] [place] [address]       FINDINGS:    No abnormal lesions are seen on diffusion-weighted views to suggest acute ischemia. The cortical sulci, fissures and cisterns are normal in size and appearance. Lateral, third and fourth ventricle are normal in size and appearance. No extra-axial fluid collections are seen. No evidence of mass effect or midline shift.  Moderate  periventricular subcortical and pontine chronic small vessel ischemic disease.   On sagittal views the posterior fossa, pituitary gland and corpus callosum are unremarkable. No evidence of intracranial hemorrhage on SWI views. The orbits and their contents, paranasal sinuses and calvarium are unremarkable.  Intracranial flow voids are present.     IMPRESSION:    MRI brain without contrast demonstrating: -Moderate periventricular, subcortical and pontine chronic small vessel ischemic disease. -No acute findings.  No change from 05/18/2019.  CTA H&N 05/09/2022: 1. No acute intracranial pathology. Stable chronic small vessel ischemic change. 2. Patent vasculature of the head and neck with no hemodynamically significant stenosis or occlusion. No significant atherosclerotic disease is seen.   Echo 05/26/2022: IMPRESSIONS     1. Left ventricular ejection fraction, by estimation, is 55 to 60%. The  left ventricle has normal function. The left ventricle has no regional  wall motion abnormalities. Left ventricular diastolic parameters are  consistent with Grade I diastolic  dysfunction (impaired relaxation).   2. Right ventricular systolic function is normal. The right ventricular  size is normal.   3. The mitral valve is normal in structure. Trivial mitral valve  regurgitation. No evidence of mitral stenosis.   4. The aortic valve is tricuspid. Aortic valve regurgitation is trivial.  No aortic stenosis is present.   5. The inferior vena cava is normal in size with greater than 50%  respiratory variability, suggesting right atrial pressure of 3 mmHg.   6. Agitated saline contrast bubble study was negative, with no evidence  of any interatrial shunt.   Comparison(s): No prior Echocardiogram.   Patient complains of  symptoms per HPI as well as the following symptoms: none . Pertinent negatives and positives per HPI. All others negative   05/04/2022: 71 y.o. male here as a new request from Dr.  Yong Channel for ataxia, dizziness, changes of visual perception, abnormal white matter changes of brain. 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 Thornell Mule ENT., Past medical history of K-Helix piston ossiculoplasty, malleus ro neomembrane, right tympanoplasty, incus implant, HTN, HLD, Pre-diabetes.  He has advanced for age white matter changes in the brain.He had difficulty couldn;t see peripherally out of his right eye. Just happened Monday about a week ago. This was VERY different than his migraine with aura episodes, not the same, also could not write and had monocular vision loss. No sensory changes, no weakness, no facial droop.   Last Monday had an episode of "going haywire" no recent illnesses or changes in medications. He was taken off of Aspirin. He was walking around and thinking, and went to tell his wife and for 20 minutes he felt confused, would start to say something and nothing would come out, knew what he was trying to say. He would be come frustrated trying to talk, his wife provides much information he couldn;t get the information out, he was aware, 15-20 minutes, slowly it resolved, he wrote a note "what is wrong cannot" illegible writing then "go to doctor". Slowly it resolved. Then he was fine, he was able to make short sentences then got better. He took 2 aspirin after that. He slept well and no further problems.   Patient complains of symptoms per HPI as well as the following symptoms: expressive aphasia . Pertinent negatives and positives per HPI. All others negative   Interval history Jan 13, 2021: Patient with microvascular ischemia on MRI, working with his PCP with his hyperlipidemia trying to get it less than 70 LDL, also on aspirin 81 mg.  Recently increased his atorvastatin.  Still having ongoing dizziness and he has been extensively evaluated by ENT and neurology, possibly an orthostatic element and they discussed hydration, I also  discussed bradycardia that could contribute, but episodes only occur every 4 to 6 weeks, TSH was normal in March, he still takes meloxicam for osteoarthritis, and he is being treated for hyperglycemia/prediabetes with slightly trending down A1c.  Last A1c was 5.7, 6 months ago, 3 years ago it was 6.2. His memory Is fine. The end of feb/march he had ocular migraine with the swimmy vision, aura 3 days in a row. He also had a headache after the aura. Tylenol helped. Completely reversed. He snores but he is not excessively tired, not snoring all the time, only sometimes, really does not think he has OSA, but discussed.   Patient complains of symptoms per HPI as well as the following symptoms: ocular migraines . Pertinent negatives and positives per HPI. All others negative   Interval history 04/16/2019: 71 y.o. male here as a new request from Dr. Yong Channel for ataxia, dizziness, changes of visual perception, abnormal white matter changes of brain. 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 Thornell Mule ENT., Past medical history of K-Helix piston ossiculoplasty, malleus ro neomembrane, right tympanoplasty, incus implant, HTN, HLD, Pre-diabetes.  He has advanced for age white matter changes in the brain.  He has migraines, prisms in his vision, "pixilations"in his vision, squirly that occurred around July in the setting of bike riding, he has had  this in the past as well consistent with his prior episodes, he also has difficulty talking, as far back as 2007, always the same and followed by a headache. He had this multiple times in July, usually in the right eye but occurred in the left eye. The one in the left eye he had a headache, not excruciating, he has not had one since then, has difficulty with speech, 10 minutes usually. In the mornings when he stands up he feel dizzy briefly until he starts walking around. Likely orthostatic, discussed conservative measures. He is  photosensitive, dizzy which is common in migraineurs. He used to see Dr. Thornell Mule for his hearing he has an implant in the right ear helix coil. We will recommend another opinion for his white matter changes. He denies any cognitive issues, he is active, looks much younger than stated age.   I reviewed notes from his inpatient stay November 2018, his girlfriend found him at the bedside, he had word finding difficulty lasting for 10 minutes and then started to have a headache which is similar to his previous headache with visual, aphasia or 8 to 9 years ago.  He had 2 episodes of migraine with aura one with visual normal speech.  Repeat imaging was stable.  EEG was normal.  Neurologic exam was normal.  He was diagnosed with complicated migraine less likely TIA a is all work-up negative per vascular neurologist Dr. Erlinda Hong.  He was asked to take aspirin 81 mg daily, prior to admission he was only taking it twice a week.  He was also discharged on Lipitor 20 mg daily (prior was only taking it twice a week).  Also advised ongoing aggressive stroke risk factor management.  Also addressed advanced white matter disease with a frontal predominance where is developing confluent signal abnormality, nonspecific pattern, likely from chronic microvascular disease at this age and uncontrolled risk factors.  Echocardiogram was unremarkable for features concerning for stroke risks, he did have mild pulmonary hypertension,  abnormal left ventricular relaxation grade 1 diastolic dysfunction, I do not see TEE.   I reviewed MRI of the brain images completed November 2018 which showed no significant changes.  I also reviewed CTA of the head and neck reports which showed no acute intracranial process or significant stenosis.  Interval history 02/10/2016: 71 y.o. male here as a referral from Dr. Yong Channel for ataxia, dizziness, changes of visual perception. 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 .  He has advanced for age white matter changes in the brain. HgbA1c 6.0, LDL was elevated at 137. Today he brings in labs values since 1994 which showed he has had hyperlipidemia since then. He also had a 10 year. Of significant stress.. He has taken Adderall for many years in the past which can elevate blood pressure but trends in the clinic recently and at home have been normal. CTA of the head and neck showed no significant extracranial or intracranial stenosis of significance. Reviewed CTA of the head and neck which showed no significant atheroscleoris. He comes in with girlfriend today, reviewed MRi images and discussed findings with wife who was not here at last appointment.   CTA of the head and neck 01/18/2016: No extracranial or intracranial stenosis or occlusion of significance.   No abnormal postcontrast enhancement.   Hypoattenuation of white matter, better visualized on MR, consistent with advanced chronic microvascular ischemic  change.    HPI:  Peter Novak is a 71 y.o. male here as a referral from Dr. Yong Channel for ataxia, dizziness, changes of visual perception. 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 . He denies HTN, Diabetes, HLD, smoking, alcohol use or other vascular risk factors. He had some dizziness earlier this year in March, within 15 minutes of waking up he would have some leaning to one side, spinning feeling, he would have some imbalance on walking which would resolve. It happened for several days otherwise nothing else happened. Had several episodes. The end of April he had it for 3 days continuously, dizziness, lightheaded, no room spinning, no CP, no SOB, no weakness, no other focal neurologic deficits. No episodes  since April. No FHx of autoimmune disorders, parents died in their 53s, he is generally healthy. No inciting events, no head trauma or previous illnesses. No travel overseas. Nothing made the symptoms worse or better. No history of neurologic symptoms. No family history of autoimmune disorders, neurodegenerative, diseases. No other focal neurologic deficits, no dysarthria, no dysphagia, no aphasia, no vision changes, no weakness or sensory changes. Currently he denies no gait abnormalities. He has had worsening left hearing changes with fullness in the ear. Fullness in the ear feels better with Valsalva. Denies headaches, no family history of personal history of migraines.  Reviewed notes, labs and imaging from outside physicians, which showed: Reviewed notes by Dr. Hermina Barters. Seen in the office complaining of dizziness as a rotation torque sensation. Symptoms began 3-4 months ago spells in early March. Spells occurred in the morning or in the afternoon. Wobbly. No nausea or vomiting. He does describe decreased hearing in the left ear with a feeling of constant fullness. Feels better with Valsalva. He has a water-like feeling in both ears but no fluid draining out of either ear. Denied headaches, migraine disease. Exam reviewed patient's right tympanic membrane is stable, no middle ear effusion, no sign of infection, implant appears in place, asymmetric high frequency sensorineural hearing loss, right ear greater than left. 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  MRI of the brain 12/16/2015 (personally reviewed images and agree with the following) Normal and symmetric labyrinthine signal. No thickening or enhancement along the vestibular cochlear nerves to suggest schwannoma. No enlarged endolymphatic sac. No specific explanation for ataxia - the cerebellum and brainstem has normal size and signal. Normal appearance of the vertebral and basilar arteries.   Extensive for  age white matter disease in the cerebral hemispheres with a frontal predominance where there is developing confluent signal abnormality. The pattern is nonspecific but usually from chronic microvascular disease at this age. There is no specific demyelinating pattern.   IMPRESSION: 1. No explanation for the provided history. 2. Moderate white matter disease, nonspecific pattern but usually from chronic microvascular ischemia.    Review of Systems: Patient complains of symptoms per HPI as well as the following symptoms: ringing in ears, spinning sensation, dizziness . Pertinent negatives per HPI. All others negative.   Social History   Socioeconomic History   Marital status: Married    Spouse name: Not on file   Number of children: Not on file   Years of education: Not on file   Highest education level: Not on file  Occupational History   Not on file  Tobacco Use   Smoking status:  Never    Passive exposure: Never   Smokeless tobacco: Never  Vaping Use   Vaping Use: Never used  Substance and Sexual Activity   Alcohol use: Yes    Alcohol/week: 1.0 - 2.0 standard drink of alcohol    Types: 1 - 2 Standard drinks or equivalent per week    Comment: social only    Drug use: No   Sexual activity: Not on file  Other Topics Concern   Not on file  Social History Narrative   Peter Novak 2021. Married 2006, divorced 2010. 2 children (35 and 44 in 2016 in good health), no grandkids.    [employer] grad.       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       Hobbies: place in [place] in [place], outdoor activities, reading, follow things on PC      Right handed      Lives at home with wife and dog       Caffeine: 2 cups/day   Social Determinants of Health   Financial Resource Strain: Not on file  Food Insecurity: Not on file  Transportation Needs: Not on file  Physical Activity: Not on file  Stress: Not on file  Social  Connections: Not on file  Intimate Partner Violence: Not on file    Family History  Problem Relation Age of Onset   Diverticulosis Mother    Diabetes Father        AAA   Sudden death Brother 90       CAD; MI @ 20, smoker, sedentary, overweight, diabetes, HLD   Diabetes Brother    Hyperlipidemia Brother    Colon cancer Maternal Aunt        dx. 38s   Cancer Paternal Grandmother 45       unknown type   Heart attack Paternal Grandfather 5   Breast cancer Cousin 60   Stroke Neg Hx    Neuropathy Neg Hx    Multiple sclerosis Neg Hx    Migraines Neg Hx    Dementia Neg Hx    Colon polyps Neg Hx    Crohn's disease Neg Hx    Esophageal cancer Neg Hx    Rectal cancer Neg Hx    Stomach cancer Neg Hx    Ulcerative colitis Neg Hx     Past Medical History:  Diagnosis Date   ADD (attention deficit disorder with hyperactivity)    Allergy    seasonal   Arthritis    Cancer ([place])    prostate cancer   Cataract    bilateral,removed   Complication of anesthesia    Constipation    GERD (gastroesophageal reflux disease)    Hearing loss    History of skin cancer    Melanoma X 2   Hyperlipidemia    Lyme disease 01/2021    Past Surgical History:  Procedure Laterality Date   CATARACT EXTRACTION, BILATERAL  March 2022 and April 2022   CHOLECYSTECTOMY  2005   COLONOSCOPY  2008   negative   ELBOW SURGERY  1994   HERNIA REPAIR  2005   INNER EAR SURGERY  2006   Incus transposition;oscillculoplasty; Dr Thereasa Parkin EAR SURGERY  2011   Stirrup resection   LYMPHADENECTOMY Bilateral 11/04/2021   Procedure: LYMPHADENECTOMY, PELVIC;  Surgeon: Raynelle Bring, MD;  Location: [facility];  Service: Urology;  Laterality: Bilateral;   POLYPECTOMY     ROBOT ASSISTED LAPAROSCOPIC RADICAL PROSTATECTOMY  N/A 11/04/2021   Procedure: XI ROBOTIC ASSISTED LAPAROSCOPIC RADICAL PROSTATECTOMY LEVEL 3;  Surgeon: Raynelle Bring, MD;  Location: [facility];  Service: Urology;  Laterality: N/A;   SHOULDER ARTHROSCOPY   2004   Dr Mayer Camel   TONSILLECTOMY AND ADENOIDECTOMY     VASECTOMY      Current Outpatient Medications  Medication Sig Dispense Refill   acetaminophen (TYLENOL) 325 MG tablet Take 650 mg by mouth as needed for moderate pain.     ALPRAZolam (XANAX) 0.5 MG tablet Take 0.25 mg by mouth at bedtime.     aspirin EC 81 MG tablet Take 1 tablet (81 mg total) by mouth daily. Swallow whole. 30 tablet 12   atorvastatin (LIPITOR) 20 MG tablet TAKE ONE TABLET BY MOUTH DAILY 90 tablet 3   Chlorpheniramine Maleate (ALLERGY RELIEF PO) Take 2 mg by mouth daily as needed (allergies).     Cholecalciferol (VITAMIN D3 PO) Take by mouth daily.     Cyanocobalamin (VITAMIN B-12 SL) Place under the tongue daily.     dextroamphetamine (DEXTROSTAT) 5 MG tablet Take 5 mg by mouth as needed.     esomeprazole (NEXIUM) 20 MG capsule Take 1 capsule (20 mg total) by mouth daily. (Patient taking differently: Take 20 mg by mouth as needed.) 90 capsule 2   Multiple Vitamin (MULTIVITAMIN ADULT PO) Take by mouth daily.     polyethylene glycol (MIRALAX / GLYCOLAX) 17 g packet Take 17 g by mouth as needed.     No current facility-administered medications for this visit.    Allergies as of 08/09/2022   (No Known Allergies)    Vitals: BP 110/61   Pulse 66   Ht '6\' 2"'$  (1.88 m)   Wt 241 lb 9.6 oz (109.6 kg)   BMI 31.02 kg/m  Last Weight:  Wt Readings from Last 1 Encounters:  08/09/22 241 lb 9.6 oz (109.6 kg)   Last Height:   Ht Readings from Last 1 Encounters:  08/09/22 '6\' 2"'$  (1.88 m)  Exam: NAD, pleasant                  Speech:    Speech is normal; fluent and spontaneous with normal comprehension.  Cognition:    The patient is oriented to person, place, and time;     recent and remote memory intact;     language fluent;    Cranial Nerves:    The pupils are equal, round, and reactive to light.Trigeminal sensation is intact and the muscles of mastication are normal. The face is symmetric. The palate elevates in  the midline. Hearing intact. Voice is normal. Shoulder shrug is normal. The tongue has normal motion without fasciculations.   Coordination:  No dysmetria  Motor Observation:    No asymmetry, no atrophy, and no involuntary movements noted. Tone:    Normal muscle tone.     Strength:    Strength is V/V in the upper and lower limbs.      Sensation: intact to LT       Assessment/Plan:  71 y.o. 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  - acute episode of expressive aphasia, right monocular vision loss, inability to write, very  suspicious for embolic stroke/ TIA need stroke workup asap, has multiple vascular risk factors: workup did not show a stroke, likely a TIA  MRI brain stroke protocol - stable white matter changes no stroke (see below) CTA of the head and neck - no significant large vessel stenosis Echocardiogram heart with bubbles study - neg for pfo 30-day cardiac monitor - neg for afib Consider loop recorder to monitor for afib - he declines for now, gave him information to read  - MRI of the brain 2023, 2018 and 2020 showed stable advanced-for-age white matter disease in the cerebral hemispheres with a frontal and left hemispheric predominance where there is developing confluent signal abnormality. The pattern is nonspecific but usually from chronic microvascular disease at this age. He is managing his HLD,obesity,  pre-diabetes. High blood pressure over certain number of years possibly in the setting of Adderall may have contributed as well.  - Denies any symptoms of OSA or cognitive decline.  - CTA of  the head and neck 2017 showed No extracranial or intracranial stenosis or occlusion of significance. Repeat 2018 and 2023 also did not show large vessel disease   -  asa '81mg'$  daily   - cont managing risk factors  I had a long d/w patient about increased risk for stroke/TIAs, personally independently reviewed imaging studies and stroke evaluation results and answered questions.Continue ASA daily for stroke prevention and maintain strict control of hypertension with blood pressure goal below 130/90, diabetes with hemoglobin A1c goal below 6.5% and lipids with LDL cholesterol goal below 70 mg/dL.I also advised the patient to eat a healthy diet with plenty of whole grains, cereals, fruits and vegetables, exercise regularly and maintain ideal body weight .   Cc: Candise Che, NP Dr. Milus Banister, MD  [facility] [address] [place], [place] [address]  Phone [phone] Fax [phone]  I spent 40  minutes of face-to-face and non-face-to-face time with patient on the  1. TIA (transient ischemic attack)      diagnosis.  This included previsit chart review, lab review, study review, order entry, electronic health record documentation, patient education on the different diagnostic and therapeutic options, counseling and coordination of care, risks and benefits of management, compliance, or risk factor reduction

## 2022-08-09 NOTE — Patient Instructions (Addendum)
Consider loop recorder to monitor for afib Daily asa  Transient Ischemic Attack A transient ischemic attack (TIA) happens when blood supply to the brain is blocked temporarily. A TIA causes stroke-like symptoms that go away quickly without causing any permanent damage. Having a TIA can be considered a warning sign for a stroke and should not be ignored. A person who has a TIA is at higher risk for a stroke. What are the causes? This condition is caused by a temporary blockage in an artery in the head or neck. This means the brain does not get the blood supply it needs. A blockage can be caused by: Fatty buildup in an artery in the head or neck (atherosclerosis). A blood clot traveling from the heart. Such as in Atrial Fibrillation An artery tear (dissection). Inflammation of an artery (vasculitis). Sometimes the cause is not known. What increases the risk? Certain factors make you more likely to develop this condition. Some of these are things you can change, including: Using products that contain nicotine or tobacco. Being inactive. Heavy alcohol use. Drug use, especially cocaine and methamphetamine. Medical conditions that may increase your risk include: High blood pressure (hypertension). High cholesterol. Diabetes. Heart disease (coronary artery disease). An irregular heartbeat, also called atrial fibrillation (AFib). Sickle cell disease. Blood clotting disorders (hypercoagulable state). Other risk factors include: Being over the age of 21. Being male. Obesity. Sleep problems such as sleep apnea. Family history of stroke. Previous history of blood clots, stroke, TIA, or heart attack. What are the signs or symptoms? Symptoms of a TIA are the same as those of a stroke. The symptoms develop suddenly, and then go away quickly. They may include: Dizziness, loss of balance and coordination, or trouble walking. Vision changes, such as double vision, blurred vision, or loss of  vision. Weakness or numbness in your face, arm, or leg, especially on one side of your body. Trouble speaking, understanding speech, or both (aphasia). Nausea and vomiting. Severe headache. Confusion. If possible, note what time your symptoms started. Tell your health care provider. How is this diagnosed? This condition may be diagnosed based on: Your symptoms and medical history. A physical exam. Imaging tests, usually a CT scan or MRI of the brain. Blood tests. You may also have other tests, including: Electrocardiogram (ECG). Echocardiogram. Continuous heart monitoring. Carotid ultrasound. A scan of blood circulation in the brain (CT angiogram or MR angiogram). How is this treated? The goal of treatment is to reduce the risk for a stroke. Stroke prevention therapies may include: Changes to diet and lifestyle, such as being physically active and stopping smoking. Treating other health conditions, such as diabetes or AFib. Medicines to thin the blood (antiplatelets or anticoagulants). Blood pressure medicines. Medicines to reduce cholesterol. If testing shows a narrowing in the arteries to your brain, your health care provider may recommend a procedure, such as: Carotid endarterectomy. This is done to remove the blockage from your artery. Carotid angioplasty and stenting. This uses a small mesh tube (stent) to open or widen an artery in the neck. The stent helps keep the artery open by supporting the artery walls. Follow these instructions at home: Medicines Take over-the-counter and prescription medicines only as told by your health care provider. If you were told to take a medicine to thin your blood, such as aspirin or an anticoagulant, use it exactly as told by your health care provider. Taking too much blood-thinning medicine can cause bleeding. Taking too little will not protect you against a stroke  and other problems. Eating and drinking  Eat 5 or more servings of fruits  and vegetables each day. Follow guidelines from your health care provider about your diet. You may need to follow a certain diet to help manage risk factors for stroke. This may include: Eating a low-fat, low-salt diet. Choosing high-fiber foods. Limiting carbohydrates and sugar. If you drink alcohol: Limit how much you have to: 0-1 drink a day for women who are not pregnant. 0-2 drinks a day for men. Know how much alcohol is in your drink. In the [place] one drink equals one 12 oz bottle of beer (355 mL), one 5 oz glass of wine (148 mL), or one 1 oz glass of hard liquor (44 mL). General instructions Maintain a healthy weight. Try to get at least 30 minutes of exercise on most days. Get treatment if you have sleep apnea. Do not use any products that contain nicotine or tobacco. These products include cigarettes, chewing tobacco, and vaping devices, such as e-cigarettes. If you need help quitting, ask your health care provider. Do not use illegal drugs. Keep all follow-up visits. Your health care provider will want to know if you have any more symptoms and to check blood labs if any medicines were prescribed. Where to find more information American Stroke Association: stroke.org Get help right away if: You have chest pain. You have fast or irregular heartbeats (palpitations). You have any symptoms of a stroke. "BE FAST" is an easy way to remember the main warning signs of a stroke. B - Balance. Signs are dizziness, sudden trouble walking, or loss of balance. E - Eyes. Signs are trouble seeing or a sudden change in vision. F - Face. Signs are sudden weakness or numbness of the face, or the face or eyelid drooping on one side. A - Arms. Signs are weakness or numbness in an arm. This happens suddenly and usually on one side of the body. S - Speech.Signs are sudden trouble speaking, slurred speech, or trouble understanding what people say. T - Time. Time to call emergency services. Write down  what time symptoms started. You have other signs of a stroke, such as: A sudden, severe headache with no known cause. Nausea or vomiting. Seizure. These symptoms may be an emergency. Get help right away. Call 911. Do not wait to see if the symptoms will go away. Do not drive yourself to the hospital. This information is not intended to replace advice given to you by your health care provider. Make sure you discuss any questions you have with your health care provider. Document Revised: 01/28/2022 Document Reviewed: 01/28/2022 Elsevier Patient Education  [place] Recorder Placement  An implantable loop recorder is a small electronic device that is placed under the skin of the chest. The device records the electrical activity of the heart over a long period of time. Your health care provider can download these recordings to monitor your heart. You may need an implantable loop recorder if you have periods of abnormal heart activity (arrhythmias) or unexplained fainting (syncope). The recorder can be left in place for as long as 3 years. Tell a health care provider about: Any allergies you have. All medicines you are taking, including vitamins, herbs, eye drops, creams, and over-the-counter medicines. Any problems you or family members have had with anesthesia. Any bleeding problems you have. Any surgeries you have had. Any medical conditions you have. Whether you are pregnant or may be pregnant. What are the risks?  Your health care provider will talk with you about risks. These may include: Infection. Bleeding. Allergic reactions to anesthesia. Damage to nerves or blood vessels. Failure of the device to work. This could require another surgery to replace it. What happens before the procedure? You may have a physical exam, blood tests, and imaging tests, such as a chest X-ray. Follow instructions from your health care provider about what you may eat and  drink Ask your health care provider about: Changing or stopping your regular medicines. These include any diabetes medicines or blood thinners you take. Taking medicines such as aspirin and ibuprofen. These medicines can thin your blood. Do not take them unless your health care provider tells you to. Taking over-the-counter medicines, vitamins, herbs, and supplements. Ask your health care provider: How your surgery site will be marked. What steps will be taken to help prevent infection. These steps may include: Removing hair at the surgery site. Washing skin with a soap that kills germs. Taking antibiotics. If you will be going home right after the procedure, plan to have a responsible adult: Take you home from the hospital or clinic. You will not be allowed to drive. Do not use any products that contain nicotine or tobacco before the procedure. These products include cigarettes, chewing tobacco, and vaping devices, such as e-cigarettes. If you need help quitting, ask your health care provider What happens during the procedure? An IV will be inserted into one of your veins. You may be given: A sedative. This helps you relax Anesthesia. This will: Numb certain areas of your body. Make you fall asleep for surgery. A small incision will be made on the left side of your upper chest. A pocket will be created under your skin. The device will be placed in the pocket. The incision will be closed with stitches (sutures) or adhesive strips. A bandage (dressing) will be placed over the incision. The procedure may vary among health care providers and hospitals. What happens after the procedure? Your blood pressure, heart rate, breathing rate, and blood oxygen level will be monitored until you leave the hospital or clinic. You may be able to go home on the day of your surgery. Before you go home: Your health care provider will program your recorder. You will learn how to trigger your device with a  handheld activator. You will learn how to send recordings to your health care provider. You will get an ID card for your device, and you will be told when to use it. This information is not intended to replace advice given to you by your health care provider. Make sure you discuss any questions you have with your health care provider. Document Revised: 01/10/2022 Document Reviewed: 01/10/2022 Elsevier Patient Education  [place].  Sleep Apnea Sleep apnea is a condition in which breathing pauses or becomes shallow during sleep. People with sleep apnea usually snore loudly. They may have times when they gasp and stop breathing for 10 seconds or more during sleep. This may happen many times during the night. Sleep apnea disrupts your sleep and keeps your body from getting the rest that it needs. This condition can increase your risk of certain health problems, including: Heart attack. Stroke. Obesity. Type 2 diabetes. Heart failure. Irregular heartbeat. High blood pressure. The goal of treatment is to help you breathe normally again. What are the causes?  The most common cause of sleep apnea is a collapsed or blocked airway. There are three kinds of sleep apnea: Obstructive sleep  apnea. This kind is caused by a blocked or collapsed airway. Central sleep apnea. This kind happens when the part of the brain that controls breathing does not send the correct signals to the muscles that control breathing. Mixed sleep apnea. This is a combination of obstructive and central sleep apnea. What increases the risk? You are more likely to develop this condition if you: Are overweight. Smoke. Have a smaller than normal airway. Are older. Are male. Drink alcohol. Take sedatives or tranquilizers. Have a family history of sleep apnea. Have a tongue or tonsils that are larger than normal. What are the signs or symptoms? Symptoms of this condition include: Trouble staying asleep. Loud  snoring. Morning headaches. Waking up gasping. Dry mouth or sore throat in the morning. Daytime sleepiness and tiredness. If you have daytime fatigue because of sleep apnea, you may be more likely to have: Trouble concentrating. Forgetfulness. Irritability or mood swings. Personality changes. Feelings of depression. Sexual dysfunction. This may include loss of interest if you are male, or erectile dysfunction if you are male. How is this diagnosed? This condition may be diagnosed with: A medical history. A physical exam. A series of tests that are done while you are sleeping (sleep study). These tests are usually done in a sleep lab, but they may also be done at home. How is this treated? Treatment for this condition aims to restore normal breathing and to ease symptoms during sleep. It may involve managing health issues that can affect breathing, such as high blood pressure or obesity. Treatment may include: Sleeping on your side. Using a decongestant if you have nasal congestion. Avoiding the use of depressants, including alcohol, sedatives, and narcotics. Losing weight if you are overweight. Making changes to your diet. Quitting smoking. Using a device to open your airway while you sleep, such as: An oral appliance. This is a custom-made mouthpiece that shifts your lower jaw forward. A continuous positive airway pressure (CPAP) device. This device blows air through a mask when you breathe out (exhale). A nasal expiratory positive airway pressure (EPAP) device. This device has valves that you put into each nostril. A bi-level positive airway pressure (BIPAP) device. This device blows air through a mask when you breathe in (inhale) and breathe out (exhale). Having surgery if other treatments do not work. During surgery, excess tissue is removed to create a wider airway. Follow these instructions at home: Lifestyle Make any lifestyle changes that your health care provider  recommends. Eat a healthy, well-balanced diet. Take steps to lose weight if you are overweight. Avoid using depressants, including alcohol, sedatives, and narcotics. Do not use any products that contain nicotine or tobacco. These products include cigarettes, chewing tobacco, and vaping devices, such as e-cigarettes. If you need help quitting, ask your health care provider. General instructions Take over-the-counter and prescription medicines only as told by your health care provider. If you were given a device to open your airway while you sleep, use it only as told by your health care provider. If you are having surgery, make sure to tell your health care provider you have sleep apnea. You may need to bring your device with you. Keep all follow-up visits. This is important. Contact a health care provider if: The device that you received to open your airway during sleep is uncomfortable or does not seem to be working. Your symptoms do not improve. Your symptoms get worse. Get help right away if: You develop: Chest pain. Shortness of breath. Discomfort in your back,  arms, or stomach. You have: Trouble speaking. Weakness on one side of your body. Drooping in your face. These symptoms may represent a serious problem that is an emergency. Do not wait to see if the symptoms will go away. Get medical help right away. Call your local emergency services (911 in the [place] Do not drive yourself to the hospital. Summary Sleep apnea is a condition in which breathing pauses or becomes shallow during sleep. The most common cause is a collapsed or blocked airway. The goal of treatment is to restore normal breathing and to ease symptoms during sleep. This information is not intended to replace advice given to you by your health care provider. Make sure you discuss any questions you have with your health care provider. Document Revised: 03/24/2021 Document Reviewed: 07/24/2020 Elsevier Patient  Education  [place].  Atrial Fibrillation  Atrial fibrillation is a type of heartbeat that is irregular or fast. If you have this condition, your heart beats without any order. This makes it hard for your heart to pump blood in a normal way. Atrial fibrillation may come and go, or it may become a long-lasting problem. If this condition is not treated, it can put you at higher risk for stroke, heart failure, and other heart problems. What are the causes? This condition may be caused by diseases that damage the heart. They include: High blood pressure. Heart failure. Heart valve disease. Heart surgery. Other causes include: Diabetes. Thyroid disease. Being overweight. Kidney disease. Sometimes the cause is not known. What increases the risk? You are more likely to develop this condition if: You are older. You smoke. You exercise often and very hard. You have a family history of this condition. You are a man. You use drugs. You drink a lot of alcohol. You have lung conditions, such as emphysema, pneumonia, or COPD. You have sleep apnea. What are the signs or symptoms? Common symptoms of this condition include: A feeling that your heart is beating very fast. Chest pain or discomfort. Feeling short of breath. Suddenly feeling light-headed or weak. Getting tired easily during activity. Fainting. Sweating. In some cases, there are no symptoms. How is this treated? Treatment for this condition depends on underlying conditions and how you feel when you have atrial fibrillation. They include: Medicines to: Prevent blood clots. Treat heart rate or heart rhythm problems. Using devices, such as a pacemaker, to correct heart rhythm problems. Doing surgery to remove the part of the heart that sends bad signals. Closing an area where clots can form in the heart (left atrial appendage). In some cases, your doctor will treat other underlying conditions. Follow these  instructions at home: Medicines Take over-the-counter and prescription medicines only as told by your doctor. Do not take any new medicines without first talking to your doctor. If you are taking blood thinners: Talk with your doctor before you take any medicines that have aspirin or NSAIDs, such as ibuprofen, in them. Take your medicine exactly as told by your doctor. Take it at the same time each day. Avoid activities that could hurt or bruise you. Follow instructions about how to prevent falls. Wear a bracelet that says you are taking blood thinners. Or, carry a card that lists what medicines you take. Lifestyle     Do not use any products that have nicotine or tobacco in them. These include cigarettes, e-cigarettes, and chewing tobacco. If you need help quitting, ask your doctor. Eat heart-healthy foods. Talk with your doctor about the right  eating plan for you. Exercise regularly as told by your doctor. Do not drink alcohol. Lose weight if you are overweight. Do not use drugs, including cannabis. General instructions If you have a condition that causes breathing to stop for a short period of time (apnea), treat it as told by your doctor. Keep a healthy weight. Do not use diet pills unless your doctor says they are safe for you. Diet pills may make heart problems worse. Keep all follow-up visits as told by your doctor. This is important. Contact a doctor if: You notice a change in the speed, rhythm, or strength of your heartbeat. You are taking a blood-thinning medicine and you get more bruising. You get tired more easily when you move or exercise. You have a sudden change in weight. Get help right away if:  You have pain in your chest or your belly (abdomen). You have trouble breathing. You have side effects of blood thinners, such as blood in your vomit, poop (stool), or pee (urine), or bleeding that cannot stop. You have any signs of a stroke. "BE FAST" is an easy way to  remember the main warning signs: B - Balance. Signs are dizziness, sudden trouble walking, or loss of balance. E - Eyes. Signs are trouble seeing or a change in how you see. F - Face. Signs are sudden weakness or loss of feeling in the face, or the face or eyelid drooping on one side. A - Arms. Signs are weakness or loss of feeling in an arm. This happens suddenly and usually on one side of the body. S - Speech. Signs are sudden trouble speaking, slurred speech, or trouble understanding what people say. T - Time. Time to call emergency services. Write down what time symptoms started. You have other signs of a stroke, such as: A sudden, very bad headache with no known cause. Feeling like you may vomit (nausea). Vomiting. A seizure. These symptoms may be an emergency. Do not wait to see if the symptoms will go away. Get medical help right away. Call your local emergency services (911 in the [place] Do not drive yourself to the hospital. Summary Atrial fibrillation is a type of heartbeat that is irregular or fast. You are at higher risk of this condition if you smoke, are older, have diabetes, or are overweight. Follow your doctor's instructions about medicines, diet, exercise, and follow-up visits. Get help right away if you have signs or symptoms of a stroke. Get help right away if you cannot catch your breath, or you have chest pain or discomfort. This information is not intended to replace advice given to you by your health care provider. Make sure you discuss any questions you have with your health care provider. Document Revised: 02/06/2019 Document Reviewed: 02/06/2019 Elsevier Patient Education  [place].

## 2022-08-18 DIAGNOSIS — M7652 Patellar tendinitis, left knee: Secondary | ICD-10-CM | POA: Diagnosis not present

## 2022-08-18 DIAGNOSIS — M7651 Patellar tendinitis, right knee: Secondary | ICD-10-CM | POA: Diagnosis not present

## 2022-08-18 DIAGNOSIS — M19012 Primary osteoarthritis, left shoulder: Secondary | ICD-10-CM | POA: Diagnosis not present

## 2022-09-16 DIAGNOSIS — C61 Malignant neoplasm of prostate: Secondary | ICD-10-CM | POA: Diagnosis not present

## 2022-09-23 DIAGNOSIS — N5201 Erectile dysfunction due to arterial insufficiency: Secondary | ICD-10-CM | POA: Diagnosis not present

## 2022-09-23 DIAGNOSIS — C61 Malignant neoplasm of prostate: Secondary | ICD-10-CM | POA: Diagnosis not present

## 2022-10-11 ENCOUNTER — Other Ambulatory Visit: Payer: Self-pay | Admitting: Family Medicine

## 2022-10-19 DIAGNOSIS — L720 Epidermal cyst: Secondary | ICD-10-CM | POA: Diagnosis not present

## 2022-10-19 DIAGNOSIS — D485 Neoplasm of uncertain behavior of skin: Secondary | ICD-10-CM | POA: Diagnosis not present

## 2022-10-19 DIAGNOSIS — Z85828 Personal history of other malignant neoplasm of skin: Secondary | ICD-10-CM | POA: Diagnosis not present

## 2022-10-19 DIAGNOSIS — M7981 Nontraumatic hematoma of soft tissue: Secondary | ICD-10-CM | POA: Diagnosis not present

## 2022-10-19 DIAGNOSIS — D225 Melanocytic nevi of trunk: Secondary | ICD-10-CM | POA: Diagnosis not present

## 2022-10-19 DIAGNOSIS — D034 Melanoma in situ of scalp and neck: Secondary | ICD-10-CM | POA: Diagnosis not present

## 2022-10-19 DIAGNOSIS — L821 Other seborrheic keratosis: Secondary | ICD-10-CM | POA: Diagnosis not present

## 2022-10-19 DIAGNOSIS — Z85820 Personal history of malignant melanoma of skin: Secondary | ICD-10-CM | POA: Diagnosis not present

## 2022-10-19 DIAGNOSIS — L738 Other specified follicular disorders: Secondary | ICD-10-CM | POA: Diagnosis not present

## 2022-10-19 DIAGNOSIS — L820 Inflamed seborrheic keratosis: Secondary | ICD-10-CM | POA: Diagnosis not present

## 2022-11-21 DIAGNOSIS — Z85820 Personal history of malignant melanoma of skin: Secondary | ICD-10-CM | POA: Diagnosis not present

## 2022-11-21 DIAGNOSIS — Z85828 Personal history of other malignant neoplasm of skin: Secondary | ICD-10-CM | POA: Diagnosis not present

## 2022-11-21 DIAGNOSIS — D034 Melanoma in situ of scalp and neck: Secondary | ICD-10-CM | POA: Diagnosis not present

## 2022-12-23 ENCOUNTER — Encounter: Payer: Self-pay | Admitting: Family Medicine

## 2023-01-27 ENCOUNTER — Ambulatory Visit: Payer: Medicare PPO | Admitting: Family Medicine

## 2023-01-27 ENCOUNTER — Encounter: Payer: Self-pay | Admitting: Family Medicine

## 2023-01-27 VITALS — BP 126/67 | HR 58 | Temp 98.0°F | Ht 74.0 in | Wt 241.0 lb

## 2023-01-27 DIAGNOSIS — Z131 Encounter for screening for diabetes mellitus: Secondary | ICD-10-CM | POA: Diagnosis not present

## 2023-01-27 DIAGNOSIS — R1030 Lower abdominal pain, unspecified: Secondary | ICD-10-CM | POA: Diagnosis not present

## 2023-01-27 DIAGNOSIS — E782 Mixed hyperlipidemia: Secondary | ICD-10-CM | POA: Diagnosis not present

## 2023-01-27 DIAGNOSIS — E538 Deficiency of other specified B group vitamins: Secondary | ICD-10-CM

## 2023-01-27 DIAGNOSIS — R5383 Other fatigue: Secondary | ICD-10-CM

## 2023-01-27 DIAGNOSIS — R739 Hyperglycemia, unspecified: Secondary | ICD-10-CM | POA: Diagnosis not present

## 2023-01-27 DIAGNOSIS — E559 Vitamin D deficiency, unspecified: Secondary | ICD-10-CM

## 2023-01-27 LAB — CBC WITH DIFFERENTIAL/PLATELET
Monocytes Relative: 11.8 %
WBC: 5.9 10*3/uL (ref 3.8–10.8)

## 2023-01-27 NOTE — Patient Instructions (Addendum)
Please stop by lab before you go If you have mychart- we will send your results within 3 business days of [place] receiving them.  If you do not have mychart- we will call you about results within 5 business days of [place] receiving them.  *please also note that you will see labs on mychart as soon as they post. I will later go in and write notes on them- will say "notes from Dr. Durene Cal"   If no obvious cause on labs- with all the life changes/health issues we could look at plugging you in with therapist - I think would be appropriate  Recommended follow up: Return in about 6 months (around 07/29/2023) for physical or sooner if needed.Schedule b4 you leave.

## 2023-01-27 NOTE — Progress Notes (Signed)
Phone [phone] In person visit   Subjective:   Peter Novak is a 72 y.o. year old very pleasant male patient who presents for/with See problem oriented charting Chief Complaint  Patient presents with   Fatigue    Pt c/o extreme fatigue and labs.     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    Priority: High   ADD (attention deficit disorder) 10/02/2014    Priority: Medium    Insomnia 10/02/2014    Priority: Medium    Osteoarthritis 10/02/2014    Priority: Medium    Prostate cancer (HCC) 02/12/2013    Priority: Medium    Hyperglycemia 11/30/2009    Priority: Medium    History of melanoma 11/30/2009    Priority: Medium    HYPERLIPIDEMIA 07/10/2007    Priority: Medium    Bradycardia 11/05/2019    Priority: Low   Migraine aura without headache (migraine equivalents) 04/16/2019    Priority: Low   Erectile dysfunction 10/02/2014    Priority: Low   Eustachian tube dysfunction 10/02/2014    Priority: Low   GERD (gastroesophageal reflux disease) 02/12/2013    Priority: Low   Benign neoplasm of adrenal gland 11/30/2009    Priority: Low   Genetic testing 09/21/2021   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     ALPRAZolam (XANAX) 0.5 MG tablet Take 0.25 mg by mouth at bedtime.     aspirin EC 81 MG tablet Take 1 tablet (81 mg total) by mouth daily. Swallow whole. 30 tablet 12   atorvastatin (LIPITOR) 20 MG tablet TAKE ONE TABLET BY MOUTH DAILY 90 tablet 3   Chlorpheniramine Maleate  (ALLERGY RELIEF PO) Take 2 mg by mouth daily as needed (allergies).     Cholecalciferol (VITAMIN D3 PO) Take by mouth daily.     dextroamphetamine (DEXTROSTAT) 5 MG tablet Take 5 mg by mouth as needed.     esomeprazole (NEXIUM) 20 MG capsule Take 1 capsule (20 mg total) by mouth daily. (Patient taking differently: Take 20 mg by mouth as needed.) 90 capsule 2   polyethylene glycol (MIRALAX / GLYCOLAX) 17 g packet Take 17 g by mouth as needed.     Cyanocobalamin (VITAMIN B-12 SL) Place under the tongue daily. (Patient not taking: Reported on 01/27/2023)     Multiple Vitamin (MULTIVITAMIN ADULT PO) Take by mouth daily. (Patient not taking: Reported on 01/27/2023)     No current facility-administered medications for this visit.     Objective:  BP 126/67   Pulse (!) 58   Temp 98 F (36.7 C) (Temporal)   Ht 6\' 2"  (1.88 m)   Wt 241 lb (109.3 kg)   SpO2 96%   BMI 30.94 kg/m  Gen: NAD, resting comfortably Oropharynx normal, tympanic membrane normal, nasal turbinates normal, No cervical, supraclavicular, axillary lymphadenopathy noted CV: RRR no murmurs rubs or gallops Lungs: CTAB no crackles, wheeze, rhonchi Abdomen: soft/nontender/nondistended/normal bowel sounds. No rebound or guarding.  Ext: Minimal edema Skin: warm, dry    Assessment and  Plan   # Fatigue S: Patient has noted significant fatigue starting in last year. Also perhaps mildly more emotional/sensitive - walking 1-2 miles- no chest pain or shortness of breath with that -no night sweats or fevers or unintentional weight loss. Does get up 3-4 x a night to urinate.   -Of note patient did have grade 1 diastolic dysfunction and trivial mitral valve regurgitation on echocardiogram/bubble study 05/26/2022 with Dr. Lucia Gaskins -Of note is on long-term PPI Nexium which could decrease B12 levels - PHQ9 of 2 with no feelings being down depressed or hopeless and General Anxiety Disorder - 7 question screening survey only 1 - but points out  stressors from the world we live in- very fair -does note pain when he needs to urinate through penis- resolves with urination -prostate cancer history but will get PSA done with urology in September -mild hoarseness at times/sporadic despite PPI.  -constipation noted. Up to date on colonoscopy 07/08/22. Colace helps some or as needed miralax.  A/P: 72 year old male with primary presenting complaint of fatigue over the last year.  No red flags in history taking -We are going to check labs to rule out organic causes of fatigue but as noted he is also felt more emotional/sensitive and he has had some major changes in the last year with the TIA as well as prostate cancer diagnosis (we did opt to hold off on PSA testing as sees urology in September and they have been trending this at their lab) we discussed the possibility of life change/aging affecting him and considered therapy if no organic cause - if hoarseness progressive would refer to ENT -Some constipation but up-to-date on colonoscopy-check TSH with labs -No exertional symptoms and reassuring echocardiogram within a year-doubt fatigue is primarily cardiac with no murmur, significant edema, shortness of breath reported -Has had some groin pain and pain in the penis before needs to urinate-we will check urine culture and urinalysis-he may call urology for their opinion  #Vitamin D deficiency S: Medication: 2000 units per day Last vitamin D Lab Results  Component Value Date   VD25OH 21.97 (L) 01/27/2022  A/P: Vitamin D has been low in the past but is on 2000 units a day-update vitamin D to see if improved   #hyperlipidemia #history of TIA- saw Dr. Lucia Gaskins- expressive aphasia issue S: Medication: Atorvastatin 20 mg, aspirin 81 mg Lab Results  Component Value Date   CHOL 140 06/18/2021   HDL 44.40 06/18/2021   LDLCALC 66 06/18/2021   LDLDIRECT 73 07/09/2020   TRIG 148.0 06/18/2021   CHOLHDL 3 06/18/2021   A/P: Lipids have been  well-controlled-update with labs today   # Hyperglycemia/insulin resistance/prediabetes S:  Medication: None Lab Results  Component Value Date   HGBA1C 6.0 01/27/2022   HGBA1C 6.1 (H) 10/20/2021   HGBA1C 6.0 06/18/2021   A/P: hopefully stable- update a1c today. Continue without meds for now   # B12 deficiency S: Current treatment/medication (oral vs. IM): None-had been on B12 but stopped 1.5-2 weeks ago- urine got darker and he wondered if related- not as yellow now Lab Results  Component Value Date   VITAMINB12 328 01/27/2022  A/P: Hopefully at least over 400-would likely restart medicine supplement if below this  # Prior blood pressures have been higher on MyChart messages but reassuring today  Recommended follow up: Return in about 6 months (around 07/29/2023) for physical or sooner if needed.Schedule b4 you leave.  Lab/Order associations: 12 30 half of hamburger and small ice cream cone  ICD-10-CM   1. Fatigue, unspecified type  R53.83 Comprehensive metabolic panel    CBC with Differential/Platelet    Vitamin B12    TSH    HgB A1c    Lipid panel    VITAMIN D 25 Hydroxy (Vit-D Deficiency, Fractures)    Urinalysis, Routine w reflex microscopic    Urine Culture    Urine Culture    Urinalysis, Routine w reflex microscopic    VITAMIN D 25 Hydroxy (Vit-D Deficiency, Fractures)    Lipid panel    HgB A1c    TSH    Vitamin B12    CBC with Differential/Platelet    Comprehensive metabolic panel    2. Hyperglycemia  R73.9 HgB A1c    HgB A1c    3. Screening for diabetes mellitus  Z13.1 HgB A1c    HgB A1c    4. HYPERLIPIDEMIA  E78.2 Comprehensive metabolic panel    CBC with Differential/Platelet    TSH    Lipid panel    Lipid panel    TSH    CBC with Differential/Platelet    Comprehensive metabolic panel    5. B12 deficiency  E53.8 Vitamin B12    Vitamin B12    6. Vitamin D deficiency  E55.9 VITAMIN D 25 Hydroxy (Vit-D Deficiency, Fractures)    VITAMIN D 25  Hydroxy (Vit-D Deficiency, Fractures)    7. Inguinal pain, unspecified laterality  R10.30 Urinalysis, Routine w reflex microscopic    Urine Culture    Urine Culture    Urinalysis, Routine w reflex microscopic      Time Spent: 43 minutes of total time (3:35 PM- 4:18 PM) was spent on the date of the encounter performing the following actions: chart review prior to seeing the patient, obtaining history (majority of visit), performing a medically necessary exam, counseling on the treatment plan and discussing potential causes for his symptoms, placing orders, and documenting in our EHR.    Return precautions advised.  Tana Conch, MD

## 2023-01-28 LAB — COMPREHENSIVE METABOLIC PANEL
AG Ratio: 1.8 (calc) (ref 1.0–2.5)
Globulin: 2.3 g/dL (calc) (ref 1.9–3.7)

## 2023-01-28 LAB — CBC WITH DIFFERENTIAL/PLATELET
Absolute Monocytes: 696 cells/uL (ref 200–950)
Eosinophils Relative: 2.7 %
Hemoglobin: 13 g/dL — ABNORMAL LOW (ref 13.2–17.1)
Lymphs Abs: 1864 cells/uL (ref 850–3900)
Platelets: 212 10*3/uL (ref 140–400)
RBC: 4.36 10*6/uL (ref 4.20–5.80)
RDW: 13.3 % (ref 11.0–15.0)
Total Lymphocyte: 31.6 %

## 2023-01-28 LAB — LIPID PANEL
HDL: 40 mg/dL (ref >=40)
LDL Cholesterol (Calc): 72 mg/dL (calc)
Non-HDL Cholesterol (Calc): 101 mg/dL (calc) (ref <130)
Total CHOL/HDL Ratio: 3.5 (calc) (ref <5.0)
Triglycerides: 191 mg/dL — ABNORMAL HIGH (ref <150)

## 2023-01-28 LAB — URINE CULTURE

## 2023-01-28 LAB — VITAMIN D 25 HYDROXY (VIT D DEFICIENCY, FRACTURES): Vit D, 25-Hydroxy: 39 ng/mL (ref 30–100)

## 2023-01-28 LAB — HEMOGLOBIN A1C
Hgb A1c MFr Bld: 6.1 % of total Hgb — ABNORMAL HIGH (ref <5.7)
Mean Plasma Glucose: 128 mg/dL

## 2023-01-30 ENCOUNTER — Encounter: Payer: Self-pay | Admitting: Family Medicine

## 2023-01-30 ENCOUNTER — Other Ambulatory Visit: Payer: Self-pay | Admitting: Family Medicine

## 2023-01-30 LAB — CBC WITH DIFFERENTIAL/PLATELET
Basophils Absolute: 47 cells/uL (ref 0–200)
Basophils Relative: 0.8 %
Eosinophils Absolute: 159 cells/uL (ref 15–500)
HCT: 39.1 % (ref 38.5–50.0)
MCH: 29.8 pg (ref 27.0–33.0)
MCHC: 33.2 g/dL (ref 32.0–36.0)
MCV: 89.7 fL (ref 80.0–100.0)
MPV: 11.7 fL (ref 7.5–12.5)
Neutro Abs: 3133 cells/uL (ref 1500–7800)
Neutrophils Relative %: 53.1 %

## 2023-01-30 LAB — URINE CULTURE
MICRO NUMBER:: 15026015
SPECIMEN QUALITY:: ADEQUATE

## 2023-01-30 LAB — COMPREHENSIVE METABOLIC PANEL
ALT: 20 U/L (ref 9–46)
AST: 19 U/L (ref 10–35)
Albumin: 4.2 g/dL (ref 3.6–5.1)
Alkaline phosphatase (APISO): 69 U/L (ref 35–144)
BUN: 22 mg/dL (ref 7–25)
CO2: 23 mmol/L (ref 20–32)
Calcium: 9.1 mg/dL (ref 8.6–10.3)
Chloride: 107 mmol/L (ref 98–110)
Creat: 1.05 mg/dL (ref 0.70–1.28)
Glucose, Bld: 96 mg/dL (ref 65–99)
Potassium: 4.6 mmol/L (ref 3.5–5.3)
Sodium: 140 mmol/L (ref 135–146)
Total Bilirubin: 0.6 mg/dL (ref 0.2–1.2)
Total Protein: 6.5 g/dL (ref 6.1–8.1)

## 2023-01-30 LAB — URINALYSIS, ROUTINE W REFLEX MICROSCOPIC

## 2023-01-30 LAB — LIPID PANEL: Cholesterol: 141 mg/dL (ref <200)

## 2023-01-30 LAB — TSH: TSH: 1.32 mIU/L (ref 0.40–4.50)

## 2023-01-30 LAB — HEMOGLOBIN A1C: eAG (mmol/L): 7.1 mmol/L

## 2023-01-30 LAB — VITAMIN B12: Vitamin B-12: 385 pg/mL (ref 200–1100)

## 2023-01-31 ENCOUNTER — Other Ambulatory Visit: Payer: Self-pay

## 2023-01-31 MED ORDER — ATORVASTATIN CALCIUM 20 MG PO TABS: 20.0000 mg | ORAL_TABLET | Freq: Every day | ORAL | 3 refills | Status: DC

## 2023-02-01 DIAGNOSIS — F411 Generalized anxiety disorder: Secondary | ICD-10-CM | POA: Diagnosis not present

## 2023-02-01 DIAGNOSIS — F900 Attention-deficit hyperactivity disorder, predominantly inattentive type: Secondary | ICD-10-CM | POA: Diagnosis not present

## 2023-02-01 NOTE — Addendum Note (Signed)
Addended by: Laddie Aquas A on: 02/01/2023 03:02 PM   Modules accepted: Orders

## 2023-03-08 ENCOUNTER — Ambulatory Visit: Payer: Medicare PPO | Admitting: Physician Assistant

## 2023-03-08 ENCOUNTER — Encounter: Payer: Self-pay | Admitting: Physician Assistant

## 2023-03-08 VITALS — BP 116/72 | HR 60 | Temp 97.1°F | Ht 74.0 in | Wt 239.4 lb

## 2023-03-08 DIAGNOSIS — R490 Dysphonia: Secondary | ICD-10-CM | POA: Diagnosis not present

## 2023-03-08 DIAGNOSIS — T148XXA Other injury of unspecified body region, initial encounter: Secondary | ICD-10-CM

## 2023-03-08 NOTE — Progress Notes (Signed)
Subjective:    Patient ID: Peter Novak, male    DOB: [birth year], 72 y.o.   MRN: [id number]  Chief Complaint  Patient presents with   Abdominal Pain    Pt in office for abdominal pain, bruised from lawnmower handle, pt states more in right groin area not abdomen per pt. Pt had previous hernia repair in the same area and gives discomfort at times but yesterday noticed the brusing    HPI Patient is in today for lower abdominal intermittent pain / bruising. Injury on 03/03/23 with lawnmower - push mower up a hill, hit a rock, jammed a handle into his right lower abdomen. Painful, but able to keep mowing that day. Yesterday morning noticed a large bruise in the area over the area of tenderness. Hurts worse with movement. Doesn't notice much swelling.  Prior inguinal hernia repair in this area and robotic prostate surgery.  Does not take a blood thinner.   Past Medical History:  Diagnosis Date   ADD (attention deficit disorder with hyperactivity)    Allergy    seasonal   Arthritis    Cancer (HCC)    prostate cancer   Cataract    bilateral,removed   Complication of anesthesia    Constipation    GERD (gastroesophageal reflux disease)    Hearing loss    History of skin cancer    Melanoma X 2   Hyperlipidemia    Lyme disease 01/2021    Past Surgical History:  Procedure Laterality Date   CATARACT EXTRACTION, BILATERAL  March 2022 and April 2022   CHOLECYSTECTOMY  2005   COLONOSCOPY  2008   negative   ELBOW SURGERY  1994   HERNIA REPAIR  2005   INNER EAR SURGERY  2006   Incus transposition;oscillculoplasty; Dr Doran Heater EAR SURGERY  2011   Stirrup resection   LYMPHADENECTOMY Bilateral 11/04/2021   Procedure: LYMPHADENECTOMY, PELVIC;  Surgeon: Heloise Purpura, MD;  Location: [facility];  Service: Urology;  Laterality: Bilateral;   POLYPECTOMY     ROBOT ASSISTED LAPAROSCOPIC RADICAL PROSTATECTOMY N/A 11/04/2021   Procedure: XI ROBOTIC ASSISTED LAPAROSCOPIC RADICAL  PROSTATECTOMY LEVEL 3;  Surgeon: Heloise Purpura, MD;  Location: [facility];  Service: Urology;  Laterality: N/A;   SHOULDER ARTHROSCOPY  2004   Dr Turner Daniels   TONSILLECTOMY AND ADENOIDECTOMY     VASECTOMY      Family History  Problem Relation Age of Onset   Diverticulosis Mother    Diabetes Father        AAA   Sudden death Brother 28       CAD; MI @ 6, smoker, sedentary, overweight, diabetes, HLD   Diabetes Brother    Hyperlipidemia Brother    Colon cancer Maternal Aunt        dx. 60s   Cancer Paternal Grandmother 2       unknown type   Heart attack Paternal Grandfather 24   Breast cancer Cousin 60   Stroke Neg Hx    Neuropathy Neg Hx    Multiple sclerosis Neg Hx    Migraines Neg Hx    Dementia Neg Hx    Colon polyps Neg Hx    Crohn's disease Neg Hx    Esophageal cancer Neg Hx    Rectal cancer Neg Hx    Stomach cancer Neg Hx    Ulcerative colitis Neg Hx     Social History   Tobacco Use   Smoking status: Never    Passive exposure:  Never   Smokeless tobacco: Never  Vaping Use   Vaping Use: Never used  Substance Use Topics   Alcohol use: Yes    Alcohol/week: 1.0 - 2.0 standard drink of alcohol    Types: 1 - 2 Standard drinks or equivalent per week    Comment: social only    Drug use: No     No Known Allergies  Review of Systems NEGATIVE UNLESS OTHERWISE INDICATED IN HPI      Objective:     BP 116/72 (BP Location: Left Arm)   Pulse 60   Temp (!) 97.1 F (36.2 C) (Temporal)   Ht 6\' 2"  (1.88 m)   Wt 239 lb 6.4 oz (108.6 kg)   SpO2 98%   BMI 30.74 kg/m   Wt Readings from Last 3 Encounters:  03/08/23 239 lb 6.4 oz (108.6 kg)  01/27/23 241 lb (109.3 kg)  08/09/22 241 lb 9.6 oz (109.6 kg)    BP Readings from Last 3 Encounters:  03/08/23 116/72  01/27/23 126/67  08/09/22 110/61     Physical Exam Vitals and nursing note reviewed.  Constitutional:      Appearance: He is well-developed.  HENT:     Head:     Comments: Some hoarseness  noted Abdominal:    Neurological:     Mental Status: He is alert.  Psychiatric:        Mood and Affect: Mood normal.       Assessment & Plan:  Hematoma  Voice hoarseness -     Ambulatory referral to ENT   Reassured patient that area of concern seems c/w hematoma. Benign, should resolve on its own. No red flags on exam. Heat or ice to area as needed. If any worsening pain or change in symptoms, needs recheck sooner.  Also he mentions progressively worsening hoarseness to voice over last few months. Recommended allergy med regimen, saline, humidifier; may consider referral to ENT & placed this for him today. He will talk with PCP at next visit about this as well.      Return if symptoms worsen or fail to improve.    Fahed Morten M Elizette Shek, PA-C

## 2023-03-21 DIAGNOSIS — Z85820 Personal history of malignant melanoma of skin: Secondary | ICD-10-CM | POA: Diagnosis not present

## 2023-03-21 DIAGNOSIS — D1801 Hemangioma of skin and subcutaneous tissue: Secondary | ICD-10-CM | POA: Diagnosis not present

## 2023-03-21 DIAGNOSIS — Z85828 Personal history of other malignant neoplasm of skin: Secondary | ICD-10-CM | POA: Diagnosis not present

## 2023-03-21 DIAGNOSIS — L820 Inflamed seborrheic keratosis: Secondary | ICD-10-CM | POA: Diagnosis not present

## 2023-03-21 DIAGNOSIS — L570 Actinic keratosis: Secondary | ICD-10-CM | POA: Diagnosis not present

## 2023-05-08 ENCOUNTER — Encounter (INDEPENDENT_AMBULATORY_CARE_PROVIDER_SITE_OTHER): Payer: Self-pay | Admitting: Otolaryngology

## 2023-05-08 ENCOUNTER — Ambulatory Visit (INDEPENDENT_AMBULATORY_CARE_PROVIDER_SITE_OTHER): Payer: Medicare PPO | Admitting: Otolaryngology

## 2023-05-08 VITALS — BP 135/78 | HR 55 | Ht 75.0 in | Wt 235.0 lb

## 2023-05-08 DIAGNOSIS — J383 Other diseases of vocal cords: Secondary | ICD-10-CM | POA: Diagnosis not present

## 2023-05-08 DIAGNOSIS — R0981 Nasal congestion: Secondary | ICD-10-CM

## 2023-05-08 DIAGNOSIS — K219 Gastro-esophageal reflux disease without esophagitis: Secondary | ICD-10-CM

## 2023-05-08 DIAGNOSIS — H9191 Unspecified hearing loss, right ear: Secondary | ICD-10-CM

## 2023-05-08 DIAGNOSIS — R0982 Postnasal drip: Secondary | ICD-10-CM | POA: Diagnosis not present

## 2023-05-08 DIAGNOSIS — R490 Dysphonia: Secondary | ICD-10-CM | POA: Diagnosis not present

## 2023-05-08 DIAGNOSIS — D37032 Neoplasm of uncertain behavior of the submandibular salivary glands: Secondary | ICD-10-CM

## 2023-05-08 DIAGNOSIS — J3089 Other allergic rhinitis: Secondary | ICD-10-CM

## 2023-05-08 MED ORDER — DESLORATADINE 5 MG PO TABS
5.0000 mg | ORAL_TABLET | Freq: Every day | ORAL | 3 refills | Status: DC
Start: 2023-05-08 — End: 2023-12-05

## 2023-05-08 MED ORDER — FLUTICASONE PROPIONATE 50 MCG/ACT NA SUSP
2.0000 | Freq: Every day | NASAL | 6 refills | Status: DC
Start: 2023-05-08 — End: 2023-09-06

## 2023-05-08 NOTE — Patient Instructions (Addendum)
-   schedule Audiogram (hearing test) and schedule CT of the neck  - schedule voice therapy - take reflux gourmet and continue Nexium - start Clarinex (allergy pill) and Flonase  - return after testing  - we will consider CT Temporal bone based on results of the above testing  - Take Reflux Gourmet (natural supplement available on [employer]) to help with symptoms of chronic throat irritation

## 2023-05-08 NOTE — Progress Notes (Signed)
ENT CONSULT:  Reason for Consult: hoarseness, concern for right submandibular stone vs calcification and decreased hearing    HPI: Peter Novak is an 71 y.o. male with hx GERD and hearing loss, hx of prostate cancer, history of stroke in the past, history of right-sided hearing loss requiring what sounds like T-plasty with OCR/stapedectomy in 2006 and 2011 with Dr. Dorma Russell, who is here for initial evaluation with me to address several complaints.  He reports history of hoarseness x 6 months without preceding URI symptoms, intubations or history of neck surgery. Had prostate cancer surgery March 9th 2023, which required intubation, but it was done before his voice changes. No dyspnea. No trouble swallowing or pain with swallowing. His voice is weak, "unsteady." Stuffy nose and sneezing in the morning, takes Claritin in the morning. No problems in the afternoon. No allergy testing. Allergic type symptoms including nasal congestion and stuffy nose started 10 years ago. Feels like he needs to clear his throat frequently. He used to teach, retired Secondary school teacher.  He reports that his recent Panorex revealed something in the area of the right submandibular gland (imaging demonstrates calcified lesion in the area of concern. Denies hx of salivary gland inflammation or xerostomia. Never had sialoadenitis  3. He had a right T-plasty with OCR/TORP in 2011 by Dr Dorma Russell, and this was done due to decreased hearing on the right side.  He had hearing improvement on the right side for about a year following the surgery, however more recently feels that his hearing declined.  No recent hearing test.  Denies ear pain or drainage.  He reports hearing improves after he is able to pop his right ear, otherwise appears to be decreased on the right side.      Records Reviewed:  Note by Dr [facility] 06/19/2015 72 year old patient who has a long history of gastro-soft, reflux disease.  He has been on maintenance  omeprazole 20 mg daily.  For the past several days he has had worsening reflux symptoms.  He has taken additional Tums and Pepcid. He was concerned about his worsening symptoms-he is aware of an acquaintance who was recently diagnosed with esophageal cancer Patient has no weight loss, dysphagia, or other complaints. He admits that his diet could be improved    Past Medical History:  Diagnosis Date   ADD (attention deficit disorder with hyperactivity)    Allergy    seasonal   Arthritis    Cancer (HCC)    prostate cancer   Cataract    bilateral,removed   Complication of anesthesia    Constipation    GERD (gastroesophageal reflux disease)    Hearing loss    History of skin cancer    Melanoma X 2   Hyperlipidemia    Lyme disease 01/2021    Past Surgical History:  Procedure Laterality Date   CATARACT EXTRACTION, BILATERAL  March 2022 and April 2022   CHOLECYSTECTOMY  2005   COLONOSCOPY  2008   negative   ELBOW SURGERY  1994   HERNIA REPAIR  2005   INNER EAR SURGERY  2006   Incus transposition;oscillculoplasty; Dr Doran Heater EAR SURGERY  2011   Stirrup resection   LYMPHADENECTOMY Bilateral 11/04/2021   Procedure: LYMPHADENECTOMY, PELVIC;  Surgeon: Heloise Purpura, MD;  Location: [facility];  Service: Urology;  Laterality: Bilateral;   POLYPECTOMY     ROBOT ASSISTED LAPAROSCOPIC RADICAL PROSTATECTOMY N/A 11/04/2021   Procedure: XI ROBOTIC ASSISTED LAPAROSCOPIC RADICAL PROSTATECTOMY LEVEL 3;  Surgeon: Laverle Patter,  Konrad Dolores, MD;  Location: [facility];  Service: Urology;  Laterality: N/A;   SHOULDER ARTHROSCOPY  2004   Dr Turner Daniels   TONSILLECTOMY AND ADENOIDECTOMY     VASECTOMY      Family History  Problem Relation Age of Onset   Diverticulosis Mother    Diabetes Father        AAA   Sudden death Brother 35       CAD; MI @ 10, smoker, sedentary, overweight, diabetes, HLD   Diabetes Brother    Hyperlipidemia Brother    Colon cancer Maternal Aunt        dx. 60s   Cancer Paternal  Grandmother 52       unknown type   Heart attack Paternal Grandfather 20   Breast cancer Cousin 60   Stroke Neg Hx    Neuropathy Neg Hx    Multiple sclerosis Neg Hx    Migraines Neg Hx    Dementia Neg Hx    Colon polyps Neg Hx    Crohn's disease Neg Hx    Esophageal cancer Neg Hx    Rectal cancer Neg Hx    Stomach cancer Neg Hx    Ulcerative colitis Neg Hx     Social History:  reports that he has never smoked. He has never been exposed to tobacco smoke. He has never used smokeless tobacco. He reports current alcohol use of about 1.0 - 2.0 standard drink of alcohol per week. He reports that he does not use drugs.  Allergies: No Known Allergies  Medications: I have reviewed the patient's current medications.  The PMH, PSH, Medications, Allergies, and SH were reviewed and updated.  ROS: Constitutional: Negative for fever, weight loss and weight gain. Cardiovascular: Negative for chest pain and dyspnea on exertion. Respiratory: Is not experiencing shortness of breath at rest. Gastrointestinal: Negative for nausea and vomiting. Neurological: Negative for headaches. Psychiatric: The patient is not nervous/anxious  Blood pressure 135/78, pulse (!) 55, height 6\' 3"  (1.905 m), weight 235 lb (106.6 kg), SpO2 98%.  PHYSICAL EXAM:  Exam: General: Well-developed, well-nourished Communication and Voice: Clear pitch and clarity Respiratory Respiratory effort: Equal inspiration and expiration without stridor Cardiovascular Peripheral Vascular: Warm extremities with equal color/perfusion Eyes: No nystagmus with equal extraocular motion bilaterally Neuro/Psych/Balance: Patient oriented to person, place, and time; Appropriate mood and affect; Gait is intact with no imbalance; Cranial nerves I-XII are intact Head and Face Inspection: Normocephalic and atraumatic without mass or lesion Palpation: Facial skeleton intact without bony stepoffs Salivary Glands: No mass or tenderness Facial  Strength: Facial motility symmetric and full bilaterally ENT Pinna: External ear intact and fully developed External canal: Canal is patent with intact skin Tympanic Membrane: Clear and mobile External Nose: No scar or anatomic deformity Internal Nose: Septum is deviated to the left. No polyp, or purulence. Mucosal edema and erythema present.  Bilateral inferior turbinate hypertrophy.  Lips, Teeth, and gums: Mucosa and teeth intact and viable TMJ: No pain to palpation with full mobility Oral cavity/oropharynx: No erythema or exudate, no lesions present Nasopharynx: No mass or lesion with intact mucosa Hypopharynx: Intact mucosa without pooling of secretions Larynx Glottic: Full true vocal cord mobility without lesion or mass Supraglottic: Normal appearing epiglottis and AE folds Interarytenoid Space: No or minimal pachydermia or edema Subglottic Space: Patent without lesion or edema Neck Neck and Trachea: Midline trachea without mass or lesion Thyroid: No mass or nodularity Lymphatics: No lymphadenopathy  Procedure:  Summary of Video-Laryngeal-Stroboscopy: VF  atrophy and glottic  insufficiency, supraglottic compression, mucosal wave intact, normal amplitude, no lesions, moderate post-cricoid edema and pachydermia   Preoperative diagnosis: hoarseness  Postoperative diagnosis:   same + VF atrophy and GERD/LPR  Procedure: Flexible fiberoptic laryngoscopy with stroboscopy (62952)  Surgeon: Ashok Croon, MD  Anesthesia: Topical lidocaine and Afrin  Complications: None  Condition is stable throughout exam  Indications and consent:   The patient presents to the clinic with hoarseness. All the risks, benefits, and potential complications were reviewed with the patient preoperatively and informed verbal consent was obtained.  Procedure: The patient was seated upright in the exam chair.   Topical lidocaine and Afrin were applied to the nasal cavity. After adequate anesthesia had  occurred, the flexible telescope was passed into the nasal cavity. The nasopharynx was patent without mass or lesion. The scope was passed behind the soft palate and directed toward the base of tongue. The base of tongue was visualized and was symmetric with no apparent masses or abnormal appearing tissue. There were no signs of a mass or pooling of secretions in the piriform sinuses. The supraglottic structures were normal.  The true vocal cords are mobile. The medial edges were bowed. Closure was incomplete. Periodicity present. The mucosal wave and amplitude were normal and symmetric. There is moderate interarytenoid pachydermia and post cricoid edema. The mucosa appears without lesions.   The laryngoscope was then slowly withdrawn and the patient tolerated the procedure well. There were no complications or blood loss.  Studies Reviewed: MRI 05/10/22 IMPRESSION:    MRI brain without contrast demonstrating: -Moderate periventricular, subcortical and pontine chronic small vessel ischemic disease. -No acute findings.  No change from 05/18/2019.    PET/CT 08/26/21 MPRESSION: Large area of radiotracer accumulation in the LEFT prostate corresponding to the lesion seen on MRI compatible with known prostate cancer.   No tracer avid areas of metastasis.   Sclerotic lesions seen in the spine and the pelvis are favored to represent bone islands despite large size of spinal lesion.   Small pulmonary nodule in the RIGHT upper lobe. No follow-up needed if patient is low-risk. Non-contrast chest CT can be considered in 12 months if patient is high-risk.  Assessment/Plan: Encounter Diagnoses  Name Primary?   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  He also reports recurrence of decreased hearing on the right side, the side where he had but sounds like he plasty with OCR stapedectomy with Dr. Lovey Newcomer in 2006 and 2011.  No recent hearing evaluation and no recent imaging.  He unfortunately is unable to follow-up with Dr. Lovey Newcomer due to insurance restrictions.  He also notes that his recent Panorex at the dental office revealed calcified lesion versus stone in the area of right submandibular gland, no imaging to rule out neoplasm or salivary gland stone.  No history of sialoadenitis.  Denies pain or swelling in the area, and both submandibular glands feel the same on exam without any evidence of masses or lymphadenopathy.     Videostrobe  today with evidence of vocal fold atrophy and glottic insufficiency with incomplete closure otherwise no lesions, he also had evidence of significant postcricoid edema and pachydermia as well as pseudosulcus consistent with history of GERD.  He had nasal congestion with postnasal drainage on exam as well.  Right TM was thickened with evidence of prior cartilage graft, both TMs appeared intact without obvious air-fluid level and middle ear.  We discussed exam findings and the fact that the most likely contributing factor to his voice changes would be age-related vocal fold atrophy.  Will refer for voice therapy.  Will optimize chronic inflammation from GERD and postnasal drainage.  Will obtain hearing test and schedule CT of the neck to evaluate right submandibular gland area.  He will start reflux Gourmet in addition to PPI.  - schedule Audiogram (hearing test) and schedule CT of the neck  - schedule voice therapy - take reflux gourmet and continue Nexium - start Clarinex  (allergy pill) and Flonase  - return after testing  - we will consider CT Temporal bone based on results of the above testing    Thank you for allowing me to participate in the care of this patient. Please do not hesitate to contact me with any questions or concerns.   Ashok Croon, MD Otolaryngology [facility] Phone: [phone] Fax: [phone]    05/08/2023, 6:44 PM

## 2023-05-15 ENCOUNTER — Telehealth: Payer: Self-pay

## 2023-05-15 NOTE — Telephone Encounter (Addendum)
CPT code 16109 Authorization#[id number] good from 09/16-11/15/2024

## 2023-05-22 DIAGNOSIS — C61 Malignant neoplasm of prostate: Secondary | ICD-10-CM | POA: Diagnosis not present

## 2023-05-23 ENCOUNTER — Ambulatory Visit: Payer: Medicare PPO | Attending: Family Medicine | Admitting: Audiology

## 2023-05-23 ENCOUNTER — Encounter: Payer: Self-pay | Admitting: Speech Pathology

## 2023-05-23 ENCOUNTER — Ambulatory Visit: Payer: Medicare PPO | Admitting: Speech Pathology

## 2023-05-23 DIAGNOSIS — H90A22 Sensorineural hearing loss, unilateral, left ear, with restricted hearing on the contralateral side: Secondary | ICD-10-CM | POA: Diagnosis not present

## 2023-05-23 DIAGNOSIS — R490 Dysphonia: Secondary | ICD-10-CM | POA: Diagnosis not present

## 2023-05-23 DIAGNOSIS — H9311 Tinnitus, right ear: Secondary | ICD-10-CM | POA: Insufficient documentation

## 2023-05-23 DIAGNOSIS — H90A31 Mixed conductive and sensorineural hearing loss, unilateral, right ear with restricted hearing on the contralateral side: Secondary | ICD-10-CM | POA: Insufficient documentation

## 2023-05-23 NOTE — Therapy (Signed)
OUTPATIENT SPEECH LANGUAGE PATHOLOGY VOICE EVALUATION   Patient Name: Peter Novak MRN: [id number] DOB:1950-10-08, 72 y.o., male Today's Date: 05/23/2023  PCP: Paulita Fujita, MD REFERRING PROVIDER: Auburn Bilberry, MD  END OF SESSION:  End of Session - 05/23/23 1120     Visit Number 1    Number of Visits 7    Date for SLP Re-Evaluation 07/04/23    Authorization Type [employer]    Progress Note Due on Visit 10    SLP Start Time 1015    SLP Stop Time  1100    SLP Time Calculation (min) 45 min    Activity Tolerance Patient tolerated treatment well             Past Medical History:  Diagnosis Date   ADD (attention deficit disorder with hyperactivity)    Allergy    seasonal   Arthritis    Cancer (HCC)    prostate cancer   Cataract    bilateral,removed   Complication of anesthesia    Constipation    GERD (gastroesophageal reflux disease)    Hearing loss    History of skin cancer    Melanoma X 2   Hyperlipidemia    Lyme disease 01/2021   Past Surgical History:  Procedure Laterality Date   CATARACT EXTRACTION, BILATERAL  March 2022 and April 2022   CHOLECYSTECTOMY  2005   COLONOSCOPY  2008   negative   ELBOW SURGERY  1994   HERNIA REPAIR  2005   INNER EAR SURGERY  2006   Incus transposition;oscillculoplasty; Dr Doran Heater EAR SURGERY  2011   Stirrup resection   LYMPHADENECTOMY Bilateral 11/04/2021   Procedure: LYMPHADENECTOMY, PELVIC;  Surgeon: Heloise Purpura, MD;  Location: [facility];  Service: Urology;  Laterality: Bilateral;   POLYPECTOMY     ROBOT ASSISTED LAPAROSCOPIC RADICAL PROSTATECTOMY N/A 11/04/2021   Procedure: XI ROBOTIC ASSISTED LAPAROSCOPIC RADICAL PROSTATECTOMY LEVEL 3;  Surgeon: Heloise Purpura, MD;  Location: [facility];  Service: Urology;  Laterality: N/A;   SHOULDER ARTHROSCOPY  2004   Dr Turner Daniels   TONSILLECTOMY AND ADENOIDECTOMY     VASECTOMY     Patient Active Problem List   Diagnosis Date Noted   Genetic testing 09/21/2021    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   Bradycardia 11/05/2019   Recurrent episodes Dizziness/Ataxia 11/05/2019   White matter abnormality on MRI of brain 04/16/2019   Migraine with aura and with status migrainosus, not intractable 04/16/2019   Ocular migraine 04/16/2019   Migraine aura without headache (migraine equivalents) 04/16/2019   ADD (attention deficit disorder) 10/02/2014   Insomnia 10/02/2014   Erectile dysfunction 10/02/2014   Osteoarthritis 10/02/2014   Eustachian tube dysfunction 10/02/2014   GERD (gastroesophageal reflux disease) 02/12/2013   Prostate cancer (HCC) 02/12/2013   Benign neoplasm of adrenal gland 11/30/2009   Hyperglycemia 11/30/2009   History of melanoma 11/30/2009   HYPERLIPIDEMIA 07/10/2007    Onset date: referral 05/08/2023  REFERRING DIAG: dysphonia  THERAPY DIAG:  Dysphonia  Rationale for Evaluation and Treatment: Rehabilitation  SUBJECTIVE:   SUBJECTIVE STATEMENT: Pt reports voice problems presenting for ~6 months, variable throughout the day, feelings of embarrassment re: voice  Pt accompanied by: self  PERTINENT HISTORY: 72 y.o. male with hx GERD and hearing loss, hx of prostate cancer, history of stroke in the past, history of right-sided hearing loss. Referred by   PAIN:  Are you having pain? No  FALLS: Has patient fallen in last 6 months? No, Number of falls:  0  LIVING ENVIRONMENT: Lives with: lives with their spouse Lives in: House/apartment  PLOF:Level of assistance: Independent with ADLs, Independent with IADLs Employment: Retired  PATIENT GOALS: Improved voice, more consistency   OBJECTIVE:   DIAGNOSTIC FINDINGS: 05/08/2023 Summary of Video-Laryngeal-Stroboscopy: VF  atrophy and glottic insufficiency, supraglottic compression, mucosal wave intact, normal amplitude, no lesions, moderate post-cricoid edema and pachydermia"  COGNITION: Overall cognitive status: Within functional limits for tasks assessed   SOCIAL  HISTORY: Occupation: retired  Counsellor intake: optimal Caffeine/alcohol intake: minimal Daily voice use: minimal  PERCEPTUAL VOICE ASSESSMENT: Voice quality: hoarse, low vocal intensity, and vocal fatigue Vocal abuse: habitual throat clearing Resonance: normal Respiratory function: speaking on residual capacity  OBJECTIVE VOICE ASSESSMENT: Maximum phonation time for sustained "ah": 12 Conversational pitch average: 102 Hz Conversational pitch range: 86-126 Hz Conversational loudness average: 68 dB Conversational loudness range: 64-69 dB S/z ratio: 1.0 (Suggestive of dysfunction >1.0)  PATIENT REPORTED OUTCOME MEASURES (PROM): V-RQOL: 75 calculated score Primary complaints: frustration because of voice, trouble speaking loudly, do not know what will come out when speaking, run out of air when speaking, sometimes get depressed because of my voice  TODAY'S TREATMENT:                                                                                                                                         05/23/23: SLP educated patient re: semi-occluded vocal tract (SOVT) exercises purpose and execution to reduce laryngeal tension and promote balanced and coordinated use of respiratory and phonatory sub-systems. SLP provided modeling and feedback throughout exercises, with pt able to achieve success at the variable pitch level. Straw in water was used to leverage biofeedback to optimize pt's understanding of performance. Updated HEP to include SOVT with handout provided. Education provided for throat clear alternatives.   PATIENT EDUCATION: Education details: vocal hygiene, throat clear alternatives, semi-occluded vocal tract exercises Person educated: Patient Education method: Explanation, Demonstration, Verbal cues, and Handouts Education comprehension: verbalized understanding, returned demonstration, and needs further education  HOME EXERCISE PROGRAM: SOVT  GOALS: Goals reviewed with  patient? Yes  SHORT TERM GOALS: Target date: 06/20/2023  Pt will report compliance with HEP 6+ days per week over 2 week period Goal status: INITIAL   Pt will accurately demonstrate straw phonation, vocal function exercises, and phonation resistance exercises to address aging voice Goal status: INITIAL   Pt will utilize throat clear alternative in 50% of opportunities over 2 sessions Goal status: INITIAL  LONG TERM GOALS: Target date: 07/04/2023  Pt will report subjective improvement via PROM by dc  Goal status: INITIAL  2.  Pt will be IND with voice HEP by dc  Goal status: INITIAL  ASSESSMENT:  CLINICAL IMPRESSION: Patient is a 72 y.o. M who was seen today for voice evaluation upon referral from Dr. Irene Pap with videostroboscopy demonstrating VF atrophy. Pt presents with mild hoarseness, occasional vocal strain, c/o vocal fatigue  and sensation of tightness in throat when speaking. Pt evidences reduced coordination of voice subsystems by speaking on residual capacity and with intermittent pressed voice. Pt demonstrated habitual throat clearing throughout session, x14 over 45 minute session. Co-morbidities which likely impact voice quality include dx of GERD, seasonal allergies causing post nasal drip. Pt is on medications for both. Will plan to instruct for additional reflux precautions. Pt would benefit from ST to address dysphonia to improve communication efficacy.   OBJECTIVE IMPAIRMENTS: include voice disorder. These impairments are limiting patient from effectively communicating at home and in community. Factors affecting potential to achieve goals and functional outcome are co-morbidities. Patient will benefit from skilled SLP services to address above impairments and improve overall function.  REHAB POTENTIAL: Good  PLAN:  SLP FREQUENCY: 1x/week  SLP DURATION: 6 weeks  PLANNED INTERVENTIONS: Cueing hierachy, Internal/external aids, Functional tasks, SLP instruction and  feedback, Compensatory strategies, and Patient/family education    SOPHAL SHAKOOR, CCC-SLP 05/23/2023, 11:37 AM

## 2023-05-23 NOTE — Procedures (Signed)
Outpatient Audiology and [facility] [address] [place], [place]  [address] [phone]  AUDIOLOGICAL  EVALUATION  NAME: Peter Novak     DOB:   [birth year]      MRN: [id number]                                                                                     DATE: 05/23/2023     REFERENT: Shelva Majestic, MD STATUS: Outpatient DIAGNOSIS: SNHL, Mixed hearing loss, tinnitus  History: Garreth was seen for an audiological evaluation due to concerns regarding his hearing sensitivity. Yeab was previously followed by Dr. Dorma Russell, Otolaryngologist, at [facility] and is now followed by Dr. Irene Pap, Otolaryngologist, at [facility].  Taivion has a longstanding history of right ear disease he has had multiple right ear surgeries.  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  His most recent ear surgery occurred in 2011.  Tip reports his right ear feels as if it stopped up and it will not clear.  He reports right tinnitus that he describes as constant.  He reports hearing difficulty in the presence of background noise and difficulty hearing and communicating with his wife.  Jovante reports increased difficulty hearing in noisy restaurants.  Jenesis also reports concerns regarding his voice and reports his voice sounds are weak and we will crack.    Arlen was last seen for an audiological evaluation on 07/08/2021 at [facility].  At this time tympanometry showed normal middle ear function in both ears.  Audiometric testing in the right ear showed normal hearing from 250-500 Hz, sloping to a mild mixed hearing loss from 1k-2k Hz, sloping to a severe loss from 4k-8k Hz. Conductive components noted at 2k and 4k Hz.  Audiometric testing in the left ear showed normal hearing at 250  Hz, sloping to a mild sensorineural hearing loss (SNHL) from 500-4k Hz, sloping to a moderate loss from 6k-8k Hz.   Evaluation:  Otoscopy showed a clear view of the tympanic membranes, bilaterally Tympanometry results were consistent with normal middle ear pressure and normal tympanic membrane mobility, (Type A), bilaterally. Audiometric testing was completed using Conventional Audiometry techniques with insert earphones and TDH headphones. Test results are consistent in the left ear with normal hearing sensitivity sloping to a mild to moderate sensorineural hearing loss.  Test results are consistent in the right ear with a mild to severe to profound conductive and mixed hearing loss.  There is a conductive asymmetry noted at 250 Hz and 3000 to 8000 Hz, worse in the right ear. Speech Recognition Thresholds were obtained at 40 dB HL in the right ear and at 30 dB HL in the left ear. Word Recognition Testing was completed at 75 dB L and [place] scored 96% and was completed at 70 dBHL in [place] scored 100%.    Results:  The test results were reviewed with Maisie Fus. Test results are consistent in the left ear with normal hearing sensitivity sloping to  a mild to moderate sensorineural hearing loss.  Test results are consistent in the right ear with a mild to severe to profound conductive and mixed hearing loss.  There is a conductive asymmetry noted at 250 Hz and 3000 to 8000 Hz, worse in the right ear.  Bucky will have hearing and communication difficulty in many listening environments.  He will benefit from the use of good communication strategies and the use of amplification, especially for the right ear.  Kennison was counseled regarding tinnitus management strategies and was given a packet of information regarding tinnitus.  He was counseled regarding the use of hearing aids and given information on hearing aid providers in the [place] area.  Recommendations: 1.   Follow-up with ENT due to history of  right ear surgery and hearing loss. 2.   Communication needs assessment with an audiologist who works with hearing aids if interested in pursuing hearing aids   30 minutes spent testing and counseling on results.   If you have any questions please feel free to contact me at [phone].  Marton Redwood Audiologist, Au.D., CCC-A 05/23/2023  3:05 PM  Cc: Shelva Majestic, MD

## 2023-05-24 ENCOUNTER — Telehealth: Payer: Self-pay | Admitting: Otolaryngology

## 2023-05-24 NOTE — Telephone Encounter (Signed)
Thanks [facility]

## 2023-05-24 NOTE — Telephone Encounter (Signed)
Spoke with patient regarding CT scan of neck. I reviewed Dr. Irene Pap OV note and explained what she was looking for. Pt conveyed understanding and is scheduled for Friday.

## 2023-05-24 NOTE — Telephone Encounter (Signed)
Pt has questions regarding upcoming CT scan. He says he's called imaging and they referred him to speak with [place] about the CT.

## 2023-05-26 ENCOUNTER — Ambulatory Visit (HOSPITAL_COMMUNITY)
Admission: RE | Admit: 2023-05-26 | Discharge: 2023-05-26 | Disposition: A | Discharge status: Home / Self Care | Payer: Medicare PPO | Source: Ambulatory Visit | Attending: Otolaryngology | Admitting: Otolaryngology

## 2023-05-26 DIAGNOSIS — R490 Dysphonia: Secondary | ICD-10-CM | POA: Diagnosis not present

## 2023-05-26 DIAGNOSIS — R351 Nocturia: Secondary | ICD-10-CM | POA: Diagnosis not present

## 2023-05-26 DIAGNOSIS — N5201 Erectile dysfunction due to arterial insufficiency: Secondary | ICD-10-CM | POA: Diagnosis not present

## 2023-05-26 DIAGNOSIS — D37032 Neoplasm of uncertain behavior of the submandibular salivary glands: Secondary | ICD-10-CM | POA: Insufficient documentation

## 2023-05-26 DIAGNOSIS — C61 Malignant neoplasm of prostate: Secondary | ICD-10-CM | POA: Diagnosis not present

## 2023-05-26 MED ORDER — IOHEXOL 350 MG/ML SOLN
75.0000 mL | Freq: Once | INTRAVENOUS | Status: AC | PRN
  Administered 2023-05-26: 75 mL via INTRAVENOUS

## 2023-05-30 ENCOUNTER — Ambulatory Visit: Payer: Medicare PPO | Admitting: Speech Pathology

## 2023-06-06 ENCOUNTER — Ambulatory Visit: Payer: Medicare PPO | Admitting: Speech Pathology

## 2023-06-08 DIAGNOSIS — L570 Actinic keratosis: Secondary | ICD-10-CM | POA: Diagnosis not present

## 2023-06-08 DIAGNOSIS — L821 Other seborrheic keratosis: Secondary | ICD-10-CM | POA: Diagnosis not present

## 2023-06-08 DIAGNOSIS — D225 Melanocytic nevi of trunk: Secondary | ICD-10-CM | POA: Diagnosis not present

## 2023-06-08 DIAGNOSIS — L812 Freckles: Secondary | ICD-10-CM | POA: Diagnosis not present

## 2023-06-08 DIAGNOSIS — C44519 Basal cell carcinoma of skin of other part of trunk: Secondary | ICD-10-CM | POA: Diagnosis not present

## 2023-06-08 DIAGNOSIS — D485 Neoplasm of uncertain behavior of skin: Secondary | ICD-10-CM | POA: Diagnosis not present

## 2023-06-08 DIAGNOSIS — Z85820 Personal history of malignant melanoma of skin: Secondary | ICD-10-CM | POA: Diagnosis not present

## 2023-06-08 DIAGNOSIS — D2261 Melanocytic nevi of right upper limb, including shoulder: Secondary | ICD-10-CM | POA: Diagnosis not present

## 2023-06-08 DIAGNOSIS — L738 Other specified follicular disorders: Secondary | ICD-10-CM | POA: Diagnosis not present

## 2023-06-12 ENCOUNTER — Encounter: Payer: Medicare PPO | Admitting: Speech Pathology

## 2023-06-12 ENCOUNTER — Encounter: Payer: Self-pay | Admitting: Family Medicine

## 2023-06-12 ENCOUNTER — Ambulatory Visit: Payer: Medicare PPO | Admitting: Family Medicine

## 2023-06-12 VITALS — BP 120/70 | HR 60 | Temp 98.5°F | Ht 75.0 in | Wt 234.6 lb

## 2023-06-12 DIAGNOSIS — U071 COVID-19: Secondary | ICD-10-CM | POA: Diagnosis not present

## 2023-06-12 DIAGNOSIS — R051 Acute cough: Secondary | ICD-10-CM

## 2023-06-12 LAB — POC COVID19 BINAXNOW: SARS Coronavirus 2 Ag: POSITIVE — AB

## 2023-06-12 NOTE — Progress Notes (Signed)
Subjective  CC:  Chief Complaint  Patient presents with   Cough    Fever, chills, cough/congestion    Same day acute visit; PCP not available. New pt to me. Chart reviewed.  HPI: Peter Novak is a 72 y.o. male who presents to the office today to address the problems listed above in the chief complaint. Very pleasant 72 year old male 2 weeks postop from sinus surgery presents with fever to 101.8, chills, chest congestion and cough.  Denies shortness of breath, chest pain, pleuritic chest pain, nausea, vomiting, diarrhea, abdominal pain, malaise or headache.  Overall feels pretty well when his fevers are down.  They are responding to over-the-counter medications.  He does describe thick blood-tinged postnasal drainage since surgery.  No sinus pain.  His symptoms started 3 days ago.  Assessment  1. COVID   2. Acute cough      Plan  COVID infection: Discussed his risk factors including age, recent surgery history of prostate cancer, however patient is feeling pretty well and has a mild case.  He is treating his fevers and they are responsive.  His lungs are clear.  We elect to monitor conservatively.  Declines Paxlovid or antivirals.  He will use Tylenol, Advil, Mucinex DM, rest and fluids.  He will let me know if things worsen.  Discussed red flags  Follow up: As needed 09/06/2023  Orders Placed This Encounter  Procedures   POC COVID-19   No orders of the defined types were placed in this encounter.     I reviewed the patients updated PMH, FH, and SocHx.    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   ALPRAZolam (XANAX) 0.5 MG tablet Take 0.25 mg by mouth at bedtime.   aspirin EC 81 MG tablet Take 1 tablet (81 mg total) by mouth daily. Swallow whole.   atorvastatin (LIPITOR) 20 MG tablet Take 1 tablet (20 mg total) by mouth daily.   Chlorpheniramine Maleate (ALLERGY RELIEF PO) Take 2 mg by mouth daily as needed (allergies).   Cholecalciferol (VITAMIN D3 PO) Take by mouth daily.   Cyanocobalamin (VITAMIN B-12 SL) Place under the tongue daily.   desloratadine (CLARINEX) 5 MG tablet Take 1 tablet (5 mg total) by mouth daily.   dextroamphetamine (DEXTROSTAT) 5 MG tablet Take 5 mg by mouth as needed.   esomeprazole (NEXIUM) 20 MG capsule Take 1 capsule (20 mg total) by mouth daily. (Patient taking differently: Take 20 mg by mouth as needed.)   fluticasone (FLONASE) 50 MCG/ACT nasal spray Place 2 sprays into both nostrils daily.   Multiple Vitamin (MULTIVITAMIN ADULT PO) Take by mouth daily.   polyethylene glycol (MIRALAX /  GLYCOLAX) 17 g packet Take 17 g by mouth as needed.    Allergies: Patient has No Known Allergies. Family History: Patient family history includes Breast cancer (age of onset: 10) in his cousin; Cancer (age of onset: 23) in his paternal grandmother; Colon cancer in his maternal aunt; Diabetes in his brother and father; Diverticulosis in his mother; Heart attack (age of onset: 5) in his paternal grandfather; Hyperlipidemia in his brother; Sudden death  (age of onset: 53) in his brother. Social History:  Patient  reports that he has never smoked. He has never been exposed to tobacco smoke. He has never used smokeless tobacco. He reports current alcohol use of about 1.0 - 2.0 standard drink of alcohol per week. He reports that he does not use drugs.  Review of Systems: Constitutional: Negative for fever malaise or anorexia Cardiovascular: negative for chest pain Respiratory: negative for SOB or persistent cough Gastrointestinal: negative for abdominal pain  Objective  Vitals: BP 120/70   Pulse 60   Temp 98.5 F (36.9 C)   Ht 6\' 3"  (1.905 m)   Wt 234 lb 9.6 oz (106.4 kg)   SpO2 96%   BMI 29.32 kg/m  General: no acute distress , A&Ox3, appears well, no respiratory distress HEENT: PEERL, conjunctiva normal, neck is supple Cardiovascular:  RRR without murmur or gallop.  Respiratory:  Good breath sounds bilaterally, CTAB with normal respiratory effort Skin:  Warm, no rashes   Office Visit on 06/12/2023  Component Date Value Ref Range Status   SARS Coronavirus 2 Ag 06/12/2023 Positive (A)  Negative Final    Commons side effects, risks, benefits, and alternatives for medications and treatment plan prescribed today were discussed, and the patient expressed understanding of the given instructions. Patient is instructed to call or message via MyChart if he/she has any questions or concerns regarding our treatment plan. No barriers to understanding were identified. We discussed Red Flag symptoms and signs in detail. Patient expressed understanding regarding what to do in case of urgent or emergency type symptoms.  Medication list was reconciled, printed and provided to the patient in AVS. Patient instructions and summary information was reviewed with the patient as documented in the AVS. This note was prepared with assistance of Dragon voice recognition software. Occasional wrong-word or sound-a-like substitutions may have occurred due to the  inherent limitations of voice recognition software

## 2023-06-14 ENCOUNTER — Ambulatory Visit (INDEPENDENT_AMBULATORY_CARE_PROVIDER_SITE_OTHER): Payer: Medicare PPO | Admitting: Otolaryngology

## 2023-06-20 ENCOUNTER — Encounter: Payer: Medicare PPO | Admitting: Speech Pathology

## 2023-07-07 ENCOUNTER — Ambulatory Visit (INDEPENDENT_AMBULATORY_CARE_PROVIDER_SITE_OTHER): Payer: Medicare PPO | Admitting: Otolaryngology

## 2023-07-07 ENCOUNTER — Encounter: Payer: Self-pay | Admitting: Family Medicine

## 2023-07-21 ENCOUNTER — Ambulatory Visit (INDEPENDENT_AMBULATORY_CARE_PROVIDER_SITE_OTHER): Payer: Medicare PPO | Admitting: Otolaryngology

## 2023-07-21 ENCOUNTER — Encounter (INDEPENDENT_AMBULATORY_CARE_PROVIDER_SITE_OTHER): Payer: Self-pay | Admitting: Otolaryngology

## 2023-07-21 VITALS — BP 146/81 | HR 60

## 2023-07-21 DIAGNOSIS — R0981 Nasal congestion: Secondary | ICD-10-CM | POA: Diagnosis not present

## 2023-07-21 DIAGNOSIS — K219 Gastro-esophageal reflux disease without esophagitis: Secondary | ICD-10-CM | POA: Diagnosis not present

## 2023-07-21 DIAGNOSIS — J3089 Other allergic rhinitis: Secondary | ICD-10-CM

## 2023-07-21 DIAGNOSIS — R0982 Postnasal drip: Secondary | ICD-10-CM

## 2023-07-21 DIAGNOSIS — K115 Sialolithiasis: Secondary | ICD-10-CM

## 2023-07-21 DIAGNOSIS — R490 Dysphonia: Secondary | ICD-10-CM

## 2023-07-21 DIAGNOSIS — J383 Other diseases of vocal cords: Secondary | ICD-10-CM

## 2023-07-21 DIAGNOSIS — H90A31 Mixed conductive and sensorineural hearing loss, unilateral, right ear with restricted hearing on the contralateral side: Secondary | ICD-10-CM | POA: Diagnosis not present

## 2023-07-21 DIAGNOSIS — H9191 Unspecified hearing loss, right ear: Secondary | ICD-10-CM

## 2023-07-21 NOTE — Patient Instructions (Signed)
-   see Dr Allena Katz for further evaluation of hearing changes

## 2023-07-21 NOTE — Progress Notes (Signed)
ENT Progress Note :  Update 07/21/23:   Discussed the use of AI scribe software for clinical note transcription with the patient, who gave verbal consent to proceed.  History of Present Illness   The patient, with a history of decreased hearing, presented for a follow-up consultation. They reported a progressive decline in hearing over the past five to seven years, particularly in the right ear. The patient described the hearing loss as significant, comparing it to having a finger blocking the ear. This has led to difficulties in environments with background noise, such as restaurants or stores. The patient also reported a history of right-sided ear surgery, performed by Dr. Lovey Newcomer, after which they initially experienced improved hearing. However, they noted a gradual decrease in hearing a few years post-surgery.  The patient also reported a history of Lyme disease, which was treated three years ago. They have been experiencing chronic fatigue for the past year, which they initially attributed to Lyme disease. However, despite treatment and multiple blood tests, the fatigue persists. The patient reported frequently feeling tired enough to nap during the day and waking up three times a night to urinate due to a history of prostate cancer. They also reported occasional snoring.  In addition, the patient reported a history of voice changes, which have significantly improved recently. They described their previous voice changes as "cracking and breaking up," but noted that these symptoms have largely resolved. The patient has been managing these symptoms with voice therapy, allergy pills, and Flonase. They reported no current issues with dry mouth or pain related to a previously identified right submandibular stone.     He returns after CT neck which revealed a single right SMG stone without associated sialoadenitis. 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 He  continues to have decreased hearing on the right side, the same side where he had two T-plasty and OCR procedures done by Dr Dorma Russell, 2006, 2011. He had voice therapy for dysphonia and feels that it helped his voice.    Initial Evaluation 05/08/23 Reason for Consult: hoarseness, concern for right submandibular stone vs calcification and decreased hearing    HPI: Peter Novak is an 72 y.o. male with hx GERD and hearing loss, hx of prostate cancer, history of stroke in the past, history of right-sided hearing loss requiring what sounds like T-plasty with OCR/stapedectomy in 2006 and 2011 with Dr. Dorma Russell, who is here for initial evaluation with me to address several complaints.  He reports history of hoarseness x 6 months without preceding URI symptoms, intubations or history of neck surgery. Had prostate cancer surgery March 9th 2023, which required intubation, but it was done before his voice changes. No dyspnea. No trouble swallowing or pain with swallowing. His voice is weak, "unsteady." Stuffy nose and sneezing in the morning, takes Claritin in the morning. No problems in the afternoon. No allergy testing. Allergic type symptoms including nasal congestion and stuffy nose started 10 years ago. Feels like he needs to clear his throat frequently. He used to teach, retired Secondary school teacher.  He reports that his recent Panorex revealed something in the area of the right submandibular gland (imaging demonstrates calcified lesion in the area of concern. Denies hx of salivary gland inflammation or xerostomia. Never had sialoadenitis  3. He had a right T-plasty with OCR/TORP in 2011 by Dr Dorma Russell, and this was done due to decreased hearing on the right side.  He had hearing improvement  on the right side for about a year following the surgery, however more recently feels that his hearing declined.  No recent hearing test.  Denies ear pain or drainage.  He reports hearing improves after he is able to pop his right ear,  otherwise appears to be decreased on the right side.      Records Reviewed:  Note by Dr [facility] 06/19/2015 72 year old patient who has a long history of gastro-soft, reflux disease.  He has been on maintenance omeprazole 20 mg daily.  For the past several days he has had worsening reflux symptoms.  He has taken additional Tums and Pepcid. He was concerned about his worsening symptoms-he is aware of an acquaintance who was recently diagnosed with esophageal cancer Patient has no weight loss, dysphagia, or other complaints. He admits that his diet could be improved    Past Medical History:  Diagnosis Date   ADD (attention deficit disorder with hyperactivity)    Allergy    seasonal   Arthritis    Cancer (HCC)    prostate cancer   Cataract    bilateral,removed   Complication of anesthesia    Constipation    GERD (gastroesophageal reflux disease)    Hearing loss    History of skin cancer    Melanoma X 2   Hyperlipidemia    Lyme disease 01/2021    Past Surgical History:  Procedure Laterality Date   CATARACT EXTRACTION, BILATERAL  March 2022 and April 2022   CHOLECYSTECTOMY  2005   COLONOSCOPY  2008   negative   ELBOW SURGERY  1994   HERNIA REPAIR  2005   INNER EAR SURGERY  2006   Incus transposition;oscillculoplasty; Dr Doran Heater EAR SURGERY  2011   Stirrup resection   LYMPHADENECTOMY Bilateral 11/04/2021   Procedure: LYMPHADENECTOMY, PELVIC;  Surgeon: Heloise Purpura, MD;  Location: [facility];  Service: Urology;  Laterality: Bilateral;   POLYPECTOMY     ROBOT ASSISTED LAPAROSCOPIC RADICAL PROSTATECTOMY N/A 11/04/2021   Procedure: XI ROBOTIC ASSISTED LAPAROSCOPIC RADICAL PROSTATECTOMY LEVEL 3;  Surgeon: Heloise Purpura, MD;  Location: [facility];  Service: Urology;  Laterality: N/A;   SHOULDER ARTHROSCOPY  2004   Dr Turner Daniels   TONSILLECTOMY AND ADENOIDECTOMY     VASECTOMY      Family History  Problem Relation Age of Onset   Diverticulosis Mother     Diabetes Father        AAA   Sudden death Brother 88       CAD; MI @ 50, smoker, sedentary, overweight, diabetes, HLD   Diabetes Brother    Hyperlipidemia Brother    Colon cancer Maternal Aunt        dx. 60s   Cancer Paternal Grandmother 79       unknown type   Heart attack Paternal Grandfather 48   Breast cancer Cousin 60   Stroke Neg Hx    Neuropathy Neg Hx    Multiple sclerosis Neg Hx    Migraines Neg Hx    Dementia Neg Hx    Colon polyps Neg Hx    Crohn's disease Neg Hx    Esophageal cancer Neg Hx    Rectal cancer Neg Hx    Stomach cancer Neg Hx    Ulcerative colitis Neg Hx     Social History:  reports that he has never smoked. He has never been exposed to tobacco smoke. He has never used smokeless tobacco. He reports current alcohol use of about 1.0 -  2.0 standard drink of alcohol per week. He reports that he does not use drugs.  Allergies: No Known Allergies  Medications: I have reviewed the patient's current medications.  The PMH, PSH, Medications, Allergies, and SH were reviewed and updated.  ROS: Constitutional: Negative for fever, weight loss and weight gain. Cardiovascular: Negative for chest pain and dyspnea on exertion. Respiratory: Is not experiencing shortness of breath at rest. Gastrointestinal: Negative for nausea and vomiting. Neurological: Negative for headaches. Psychiatric: The patient is not nervous/anxious  Blood pressure (!) 146/81, pulse 60, SpO2 98%.  PHYSICAL EXAM:  Exam: General: Well-developed, well-nourished Respiratory Respiratory effort: Equal inspiration and expiration without stridor Cardiovascular Peripheral Vascular: Warm extremities with equal color/perfusion Eyes: No nystagmus with equal extraocular motion bilaterally Neuro/Psych/Balance: Patient oriented to person, place, and time; Appropriate mood and affect; Gait is intact with no imbalance; Cranial nerves I-XII are intact Head and Face Inspection: Normocephalic and  atraumatic without mass or lesion Palpation: Facial skeleton intact without bony stepoffs Salivary Glands: No mass or tenderness Facial Strength: Facial motility symmetric and full bilaterally ENT Pinna: External ear intact and fully developed External canal: Canal is patent with intact skin Tympanic Membrane: Clear and mobile External Nose: No scar or anatomic deformity Lips, Teeth, and gums: Mucosa and teeth intact and viable TMJ: No pain to palpation with full mobility Oral cavity/oropharynx: No erythema or exudate, no lesions present Neck Neck and Trachea: Midline trachea without mass or lesion Thyroid: No mass or nodularity Lymphatics: No lymphadenopathy  Studies Reviewed: MRI 05/10/22 IMPRESSION:    MRI brain without contrast demonstrating: -Moderate periventricular, subcortical and pontine chronic small vessel ischemic disease. -No acute findings.  No change from 05/18/2019.    PET/CT 08/26/21 MPRESSION: Large area of radiotracer accumulation in the LEFT prostate corresponding to the lesion seen on MRI compatible with known prostate cancer.   No tracer avid areas of metastasis.   Sclerotic lesions seen in the spine and the pelvis are favored to represent bone islands despite large size of spinal lesion.   Small pulmonary nodule in the RIGHT upper lobe. No follow-up needed if patient is low-risk. Non-contrast chest CT can be considered in 12 months if patient is high-risk.  Assessment/Plan: Encounter Diagnoses  Name Primary?   Mixed conductive and sensorineural hearing loss of right ear with restricted hearing of left ear Yes   Decreased hearing of right ear    Dysphonia    Glottic insufficiency    Post-nasal drainage    Vocal fold atrophy    Nasal congestion    Gastroesophageal reflux disease without esophagitis    Environmental and seasonal allergies    Salivary gland stone     72 year old male with history of stroke prostate cancer GERD here for several  complaints including 6 months of dysphonia with raspy weak and crackly voice, which started without preceding URI or intubation for illness or surgery.  He also reports recurrence of decreased hearing on the right side, the side where he had but sounds like he plasty with OCR stapedectomy with Dr. Lovey Newcomer in 2006 and 2011.  No recent hearing evaluation and no recent imaging.  He unfortunately is unable to follow-up with Dr. Lovey Newcomer due to insurance restrictions.  He also notes that his recent Panorex at the dental office revealed calcified lesion versus stone in the area of right submandibular gland, no imaging to rule out neoplasm or salivary gland stone.  No history of sialoadenitis.  Denies pain or swelling in the area, and both submandibular  glands feel the same on exam without any evidence of masses or lymphadenopathy.     Videostrobe today with evidence of vocal fold atrophy and glottic insufficiency with incomplete closure otherwise no lesions, he also had evidence of significant postcricoid edema and pachydermia as well as pseudosulcus consistent with history of GERD.  He had nasal congestion with postnasal drainage on exam as well.  Right TM was thickened with evidence of prior cartilage graft, both TMs appeared intact without obvious air-fluid level and middle ear.  We discussed exam findings and the fact that the most likely contributing factor to his voice changes would be age-related vocal fold atrophy.  Will refer for voice therapy.  Will optimize chronic inflammation from GERD and postnasal drainage.  Will obtain hearing test and schedule CT of the neck to evaluate right submandibular gland area.  He will start reflux Gourmet in addition to PPI.  - schedule Audiogram (hearing test) and schedule CT of the neck  - schedule voice therapy - take reflux gourmet and continue Nexium - start Clarinex (allergy pill) and Flonase  - return after testing  - we will consider CT Temporal bone based on  results of the above testing   Update 07/21/23 Audio result  done 05/23/23   Assessment and Plan    Asymmetric Hearing Loss with mixed hearing loss with hx of T-plasty and OCR x 2 in the past on the right.  Asymmetric hearing loss with the right ear worse than the left. The right ear shows air-bone gap and mixed hearing loss, could be due to prosthesis displacement or scar tissue. The left ear has primarily sensorineural hearing loss. Discussed CROS hearing aid if no surgical options are available. CT Temporal bone scan will help assess the ossicular chain and determine the cause of hearing loss. - Refer to Dr. Allena Katz for further evaluation and potential surgical options discussion - Order dedicated temporal bone scan to assess the ossicular chain/prosthesis location - Advised to bring previous audiograms to the appointment with Dr. Allena Katz to see if there was a significant change relative to the last Audio done through [facility]   Voice Changes/dysphonia Voice changes with improvement noted after voice therapy, likely related to thinning of the vocal cords and possible seasonal allergies and PND, GERD LPR. No current symptoms of cracking or raspy/strained voice. Continued use of allergy medication and Flonase, along with minimizing reflux, should help maintain voice quality. - Continue allergy medication and Flonase - Advise on minimizing reflux with diet and lifestyle changes  - Schedule follow-up if voice issues recur in the future, continue voice therapy exercises   Right Submandibular Stone - CT neck reviewed with the patient  Small right submandibular stone (~1 cm) with gland atrophy, no mass or lymphadenopathy and no sialoadenitis or retention of saliva. Currently asymptomatic with no dry mouth or pain/SMG swelling on the right. No immediate intervention required unless symptoms develop. - Monitor for symptoms such as pain or dry mouth - No immediate intervention required  Chronic  Fatigue Chronic fatigue with a history of Lyme disease and multiple normal blood work results. Reports easy napping and nocturnal urination due to prostate cancer. Discussed possibility of sleep apnea and need for a sleep study. Explained Inspire as an option for those who fail CPAP and have moderate to severe apnea. - Recommend sleep study if symptoms persist - Contact primary care physician for repeat Lyme disease test  Follow-up - Schedule appointment with Dr. Allena Katz for hearing evaluation after CT T-bone -  Contact primary care physician for Lyme disease test and for HST if needed in the future  - Schedule follow-up with me in 6 months for sx check        Ashok Croon, MD Otolaryngology [facility] Phone: [phone] Fax: [phone]    07/21/2023, 3:16 PM

## 2023-08-02 DIAGNOSIS — Z5181 Encounter for therapeutic drug level monitoring: Secondary | ICD-10-CM | POA: Diagnosis not present

## 2023-08-02 DIAGNOSIS — F4322 Adjustment disorder with anxiety: Secondary | ICD-10-CM | POA: Diagnosis not present

## 2023-08-02 DIAGNOSIS — F411 Generalized anxiety disorder: Secondary | ICD-10-CM | POA: Diagnosis not present

## 2023-08-02 DIAGNOSIS — F900 Attention-deficit hyperactivity disorder, predominantly inattentive type: Secondary | ICD-10-CM | POA: Diagnosis not present

## 2023-08-07 ENCOUNTER — Ambulatory Visit (HOSPITAL_COMMUNITY)
Admission: RE | Admit: 2023-08-07 | Discharge: 2023-08-07 | Disposition: A | Discharge status: Home / Self Care | Payer: Medicare PPO | Source: Ambulatory Visit | Attending: Otolaryngology | Admitting: Otolaryngology

## 2023-08-07 DIAGNOSIS — H908 Mixed conductive and sensorineural hearing loss, unspecified: Secondary | ICD-10-CM | POA: Diagnosis not present

## 2023-08-07 DIAGNOSIS — H90A31 Mixed conductive and sensorineural hearing loss, unilateral, right ear with restricted hearing on the contralateral side: Secondary | ICD-10-CM | POA: Diagnosis not present

## 2023-08-10 DIAGNOSIS — Z85828 Personal history of other malignant neoplasm of skin: Secondary | ICD-10-CM | POA: Diagnosis not present

## 2023-08-10 DIAGNOSIS — L988 Other specified disorders of the skin and subcutaneous tissue: Secondary | ICD-10-CM | POA: Diagnosis not present

## 2023-08-10 DIAGNOSIS — D485 Neoplasm of uncertain behavior of skin: Secondary | ICD-10-CM | POA: Diagnosis not present

## 2023-08-10 DIAGNOSIS — Z85820 Personal history of malignant melanoma of skin: Secondary | ICD-10-CM | POA: Diagnosis not present

## 2023-08-14 ENCOUNTER — Encounter: Payer: Self-pay | Admitting: Family Medicine

## 2023-08-31 ENCOUNTER — Telehealth (INDEPENDENT_AMBULATORY_CARE_PROVIDER_SITE_OTHER): Payer: Self-pay

## 2023-08-31 NOTE — Telephone Encounter (Signed)
-----   Message from [place] sent at 08/31/2023  9:48 AM EST ----- Could you please let Mr Ninetta know his temporal bone CT was negative? thnx ----- Message ----- From: Interface, Rad Results In Sent: 08/19/2023   3:05 PM EST To: Elena Larry, MD

## 2023-08-31 NOTE — Telephone Encounter (Signed)
 I called and left a message for the  patient with his negative test results

## 2023-09-06 ENCOUNTER — Ambulatory Visit: Payer: Medicare PPO | Admitting: Family Medicine

## 2023-09-06 ENCOUNTER — Encounter: Payer: Self-pay | Admitting: Family Medicine

## 2023-09-06 VITALS — BP 162/88 | Temp 97.0°F | Ht 75.0 in | Wt 232.2 lb

## 2023-09-06 DIAGNOSIS — Z131 Encounter for screening for diabetes mellitus: Secondary | ICD-10-CM

## 2023-09-06 DIAGNOSIS — Z0000 Encounter for general adult medical examination without abnormal findings: Secondary | ICD-10-CM

## 2023-09-06 DIAGNOSIS — R739 Hyperglycemia, unspecified: Secondary | ICD-10-CM | POA: Diagnosis not present

## 2023-09-06 DIAGNOSIS — C61 Malignant neoplasm of prostate: Secondary | ICD-10-CM

## 2023-09-06 DIAGNOSIS — R413 Other amnesia: Secondary | ICD-10-CM

## 2023-09-06 DIAGNOSIS — E782 Mixed hyperlipidemia: Secondary | ICD-10-CM | POA: Diagnosis not present

## 2023-09-06 LAB — COMPREHENSIVE METABOLIC PANEL
ALT: 22 U/L (ref 0–53)
AST: 18 U/L (ref 0–37)
Albumin: 4.7 g/dL (ref 3.5–5.2)
Alkaline Phosphatase: 78 U/L (ref 39–117)
BUN: 21 mg/dL (ref 6–23)
CO2: 29 meq/L (ref 19–32)
Calcium: 9.6 mg/dL (ref 8.4–10.5)
Chloride: 105 meq/L (ref 96–112)
Creatinine, Ser: 0.95 mg/dL (ref 0.40–1.50)
GFR: 79.14 mL/min (ref >60.00)
Glucose, Bld: 110 mg/dL — ABNORMAL HIGH (ref 70–99)
Potassium: 4.2 meq/L (ref 3.5–5.1)
Sodium: 141 meq/L (ref 135–145)
Total Bilirubin: 1.1 mg/dL (ref 0.2–1.2)
Total Protein: 6.9 g/dL (ref 6.0–8.3)

## 2023-09-06 LAB — CBC WITH DIFFERENTIAL/PLATELET
Basophils Absolute: 0.1 10*3/uL (ref 0.0–0.1)
Basophils Relative: 1.2 % (ref 0.0–3.0)
Eosinophils Absolute: 0.2 10*3/uL (ref 0.0–0.7)
Eosinophils Relative: 4.8 % (ref 0.0–5.0)
HCT: 42.1 % (ref 39.0–52.0)
Hemoglobin: 13.8 g/dL (ref 13.0–17.0)
Lymphocytes Relative: 30.8 % (ref 12.0–46.0)
Lymphs Abs: 1.4 10*3/uL (ref 0.7–4.0)
MCHC: 32.8 g/dL (ref 30.0–36.0)
MCV: 90.6 fL (ref 78.0–100.0)
Monocytes Absolute: 0.6 10*3/uL (ref 0.1–1.0)
Monocytes Relative: 12.8 % — ABNORMAL HIGH (ref 3.0–12.0)
Neutro Abs: 2.2 10*3/uL (ref 1.4–7.7)
Neutrophils Relative %: 50.4 % (ref 43.0–77.0)
Platelets: 228 10*3/uL (ref 150.0–400.0)
RBC: 4.65 Mil/uL (ref 4.22–5.81)
RDW: 13.9 % (ref 11.5–15.5)
WBC: 4.4 10*3/uL (ref 4.0–10.5)

## 2023-09-06 LAB — LIPID PANEL
Cholesterol: 159 mg/dL (ref 0–200)
HDL: 48.5 mg/dL (ref >39.00)
LDL Cholesterol: 83 mg/dL (ref 0–99)
NonHDL: 110.89
Total CHOL/HDL Ratio: 3
Triglycerides: 138 mg/dL (ref 0.0–149.0)
VLDL: 27.6 mg/dL (ref 0.0–40.0)

## 2023-09-06 LAB — VITAMIN B12: Vitamin B-12: 283 pg/mL (ref 211–911)

## 2023-09-06 LAB — TSH: TSH: 2.29 u[IU]/mL (ref 0.35–5.50)

## 2023-09-06 LAB — HEMOGLOBIN A1C: Hgb A1c MFr Bld: 6.2 % (ref 4.6–6.5)

## 2023-09-06 LAB — PSA, MEDICARE: PSA: 0.01 ng/mL — ABNORMAL LOW (ref 0.10–4.00)

## 2023-09-06 NOTE — Patient Instructions (Addendum)
 Health Maintenance Due  Topic Date Due   Medicare Annual Wellness (AWV)  06/22/2019  You are eligible to schedule your annual wellness visit with our nurse specialist [place].  Please consider scheduling this before you leave today  hypertension has been controlled with diet/exercise in past and often better at home- no recent checks so he is going to monitor at home and update me in 1-2 weeks  Please stop by lab before you go If you have mychart- we will send your results within 3 business days of us  receiving them.  If you do not have mychart- we will call you about results within 5 business days of us  receiving them.  *please also note that you will see labs on mychart as soon as they post. I will later go in and write notes on them- will say notes from Dr. Katrinka    Recommended follow up: Return in about 1 year (around 09/05/2024) for physical or sooner if needed.Schedule b4 you leave.

## 2023-09-06 NOTE — Progress Notes (Addendum)
 Phone: [phone]   Subjective:  Patient presents today for their annual physical. Chief complaint-noted.   See problem oriented charting- ROS- full  review of systems was completed and negative  Per full ROS sheet completed by patient other than ongoing fatigue but better, nocturia 2-3x a night, some left shoulder pain, ongoing balance issues- occasionally shuffles with shortened stride, constipation- miralax  3 days a week works most of the time- tries to stay hydrated  The following were reviewed and entered/updated in epic: Past Medical History:  Diagnosis Date   ADD (attention deficit disorder with hyperactivity)    Allergy    seasonal   Arthritis    Cancer (HCC)    prostate cancer   Cataract    bilateral,removed   Complication of anesthesia    Constipation    GERD (gastroesophageal reflux disease)    Hearing loss    History of skin cancer    Melanoma X 2   Hyperlipidemia    Lyme disease 01/2021   Patient Active Problem List   Diagnosis Date Noted   Recurrent episodes Dizziness/Ataxia 11/05/2019    Priority: High   White matter abnormality on MRI of brain 04/16/2019    Priority: High   Migraine with aura and with status migrainosus, not intractable 04/16/2019    Priority: High   Ocular migraine 04/16/2019    Priority: High   ADD (attention deficit disorder) 10/02/2014    Priority: Medium    Insomnia 10/02/2014    Priority: Medium    Osteoarthritis 10/02/2014    Priority: Medium    Prostate cancer (HCC) 02/12/2013    Priority: Medium    Hyperglycemia 11/30/2009    Priority: Medium    History of melanoma 11/30/2009    Priority: Medium    HYPERLIPIDEMIA 07/10/2007    Priority: Medium    Bradycardia 11/05/2019    Priority: Low   Migraine aura without headache (migraine equivalents) 04/16/2019    Priority: Low   Erectile dysfunction 10/02/2014    Priority: Low   Eustachian tube dysfunction 10/02/2014    Priority: Low   GERD (gastroesophageal reflux  disease) 02/12/2013    Priority: Low   Benign neoplasm of adrenal gland 11/30/2009    Priority: Low   Genetic testing 09/21/2021   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   Past Surgical History:  Procedure Laterality Date   CATARACT EXTRACTION, BILATERAL  March 2022 and April 2022   CHOLECYSTECTOMY  2005   COLONOSCOPY  2008   negative   ELBOW SURGERY  1994   HERNIA REPAIR  2005   INNER EAR SURGERY  2006   Incus transposition;oscillculoplasty; Dr Thaddeus GRIST EAR SURGERY  2011   Stirrup resection   LYMPHADENECTOMY Bilateral 11/04/2021   Procedure: LYMPHADENECTOMY, PELVIC;  Surgeon: Renda Glance, MD;  Location: [facility];  Service: Urology;  Laterality: Bilateral;   POLYPECTOMY     ROBOT ASSISTED LAPAROSCOPIC RADICAL PROSTATECTOMY N/A 11/04/2021   Procedure: XI ROBOTIC ASSISTED LAPAROSCOPIC RADICAL PROSTATECTOMY LEVEL 3;  Surgeon: Renda Glance, MD;  Location: [facility];  Service: Urology;  Laterality: N/A;   SHOULDER ARTHROSCOPY  2004   Dr Liam   TONSILLECTOMY AND ADENOIDECTOMY     VASECTOMY      Family History  Problem Relation Age of Onset   Diverticulosis Mother    Diabetes Father        AAA   Sudden death Brother 62       CAD; MI @ 47, smoker, sedentary, overweight, diabetes, HLD   Diabetes  Brother    Hyperlipidemia Brother    Colon cancer Maternal Aunt        dx. 60s   Cancer Paternal Grandmother 105       unknown type   Heart attack Paternal Grandfather 67   Breast cancer Cousin 60   Stroke Neg Hx    Neuropathy Neg Hx    Multiple sclerosis Neg Hx    Migraines Neg Hx    Dementia Neg Hx    Colon polyps Neg Hx    Crohn's disease Neg Hx    Esophageal cancer Neg Hx    Rectal cancer Neg Hx    Stomach cancer Neg Hx    Ulcerative colitis Neg Hx     Medications- reviewed and updated Current Outpatient Medications  Medication Sig Dispense Refill   acetaminophen  (TYLENOL ) 325 MG tablet Take 650 mg by mouth as needed for moderate pain.     aspirin  EC 81 MG  tablet Take 1 tablet (81 mg total) by mouth daily. Swallow whole. 30 tablet 12   atorvastatin  (LIPITOR) 20 MG tablet Take 1 tablet (20 mg total) by mouth daily. 90 tablet 3   Cholecalciferol (VITAMIN D3 PO) Take by mouth daily.     Cyanocobalamin  (VITAMIN B-12 SL) Place under the tongue daily.     desloratadine  (CLARINEX ) 5 MG tablet Take 1 tablet (5 mg total) by mouth daily. 90 tablet 3   dextroamphetamine (DEXTROSTAT) 5 MG tablet Take 5 mg by mouth as needed.     esomeprazole  (NEXIUM ) 20 MG capsule Take 1 capsule (20 mg total) by mouth daily. (Patient taking differently: Take 20 mg by mouth as needed.) 90 capsule 2   Multiple Vitamin (MULTIVITAMIN ADULT PO) Take by mouth daily.     polyethylene glycol (MIRALAX  / GLYCOLAX ) 17 g packet Take 17 g by mouth as needed.     ALPRAZolam  (XANAX ) 0.5 MG tablet Take 0.25 mg by mouth at bedtime. (Patient not taking: Reported on 09/06/2023)     Chlorpheniramine Maleate (ALLERGY RELIEF PO) Take 2 mg by mouth daily as needed (allergies). (Patient not taking: Reported on 09/06/2023)     No current facility-administered medications for this visit.    Allergies-reviewed and updated No Known Allergies  Social History   Social History Narrative   ELINORE Light 2021. Married 2006, divorced 2010. 2 children (35 and 66 in 2016 in good health), no grandkids.    [employer] grad.       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       Hobbies: place in [place] in mountains, outdoor activities, reading, follow things on PC      Right handed      Lives at home with wife and dog       Caffeine: 2 cups/day   Objective  Objective:  BP (!) 158/80   Temp (!) 97 F (36.1 C)   Ht 6' 3 (1.905 m)   Wt 232 lb 3.2 oz (105.3 kg)   BMI 29.02 kg/m  Gen: NAD, resting comfortably HEENT: Mucous membranes are moist. Oropharynx normal.  Tympanic membrane with some scarring on the right side Neck: no thyromegaly CV: RRR no  murmurs rubs or gallops Lungs: CTAB no crackles, wheeze, rhonchi Abdomen: soft/nontender/nondistended/normal bowel sounds. No rebound or guarding.  Ext: no edema Skin: warm, dry Neuro: grossly normal, moves all extremities, PERRLA Declines genitourinary and rectal exam   Assessment and Plan  73 y.o. male presenting for annual physical.  Health Maintenance counseling: 1. Anticipatory guidance: Patient counseled regarding regular dental exams -q6 months, eye exams -yearly,  avoiding smoking and second hand smoke , limiting alcohol to 2 beverages per day - 2-3 per week, no illicit drugs .   2. Risk factor reduction:  Advised patient of need for regular exercise and diet rich and fruits and vegetables to reduce risk of heart attack and stroke.  Exercise- 5-6 days a week walking about 1.5-2 miles.  Diet/weight management--Down 5 pounds since July- trending down- trying to eat well- GF toni cooks well - gets him good foods.  Wt Readings from Last 3 Encounters:  09/06/23 232 lb 3.2 oz (105.3 kg)  06/12/23 234 lb 9.6 oz (106.4 kg)  05/08/23 235 lb (106.6 kg)  3. Immunizations/screenings/ancillary studies-fully up-to-date other than COVID-19 vaccination which he is opting out of-plus he had COVID in October Immunization History  Administered Date(s) Administered   Fluad Quad(high Dose 65+) 07/06/2020, 06/17/2021   Influenza, High Dose Seasonal PF 06/02/2017, 05/21/2018, 05/16/2019, 07/07/2023   Influenza,inj,Quad PF,6+ Mos 06/13/2019   Influenza,inj,quad, With Preservative 06/19/2015   Influenza-Unspecified 06/12/2014, 06/01/2017, 05/29/2018, 07/01/2022   PFIZER(Purple Top)SARS-COV-2 Vaccination 11/14/2019, 12/09/2019   Pneumococcal Conjugate-13 10/02/2014   Pneumococcal Polysaccharide-23 10/14/2015   Td 11/30/2009   Tdap 09/30/2015   Zoster Recombinant(Shingrix) 06/07/2018, 09/21/2018   Zoster, Live 03/23/2011   4. Prostate cancer screening- -PSA is monitored by urology for recurrence  5.  Colon cancer screening - 07/08/22 with 10 year repeat planned or no repeat due to age 66. Skin cancer screening-sees Dr. Joshua yearly with history melanoma- did have precancer removed. advised regular sunscreen use. Denies worrisome, changing, or new skin lesions.  7. Smoking associated screening (lung cancer screening, AAA screen 65-75, UA)-never smoker 8. STD screening - only active with ELINORE Light  Status of chronic or acute concerns   #prostate cancer with removal March 2023 but with extra prostatic extension but likely PSA has remained undetectable identified-followed by Dr. Renda and things have been going well   #hyperlipidemia # History of TIA on aspirin  long-term 81 mg S: Medication:Atorvastatin  20 mg, aspirin  81 mg Lab Results  Component Value Date   CHOL 141 01/27/2023   HDL 40 01/27/2023   LDLCALC 72 01/27/2023   LDLDIRECT 73 07/09/2020   TRIG 191 (H) 01/27/2023   CHOLHDL 3.5 01/27/2023   A/P: Slightly above ideal goal on last check-offered update today With history of TIA would prefer to get LDL under 70-update labs today -Has had some memory issues we opted to check B12 and thyroid  today.  There is some consideration of formal memory evaluation. Opts out HIV and syphilis testing for reversible causes of memory loss -Has also had chronic fatigue starting at least in early 2024 if not prior and history of Lyme disease reported. Fatigue slightly better in last few weeks  #elevated BP S: medication: none BP Readings from Last 3 Encounters:  09/06/23 (!) 162/88  07/21/23 (!) 146/81  06/12/23 120/70  A/P: hypertension has been controlled with diet/exercise in past and often better at home- no recent checks so he is going to monitor at home and update me in 1-2 weeks   # Hyperglycemia/insulin resistance/prediabetes S:  Medication: none Lab Results  Component Value Date   HGBA1C 6.1 (H) 01/27/2023   HGBA1C 6.0 01/27/2022   HGBA1C 6.1 (H) 10/20/2021   A/P: hopefully stable-  update a1c today. Continue without meds for now    # GERD S:Medication: esomeprazole  20 mg A/P: Controlled. Continue  current medications. -B12 low normal last visit at 385-recommended at least once weekly B12 began today    #ENT- has seen ENT for hoarseness, hearing loss, abnormal dental scan but had follow up CT temporal bones reassuring   # ADD-managed by psychiatry on dextroamphetamine with Olam Silva tried psychiatric - Also on alprazolam  to help with sleep through psychiatry- some correlation with memory loss   #Wrist pain- sometimes hard to open bottles for instance- small lids. Does do some ibuprofen to try to help- discussed bleeding risk and kidney risk. Doesn't want to get Voltaren on eyes so doesn't use on wrists. Not bad enough to see sports medicine or orthopedics   #Left shoulder pain- saw Dr. Liam a year ago- aggravating but not at point he wants to have surgery. Was told not physical therapy issues. Prior fall and arthroscopic surgery.   #ongoing balance issues- occasionally shuffles with shortened stride- we discussed possibly discussing with Dr. Ines vs referral to Dr. Evonnie especially with history of melanoma and possible early Parkinson's symptoms. Fatigue could correlate with Parkinsons -going back to exercise club and wants to see how he does there first -no known resting tremor- mild intention tremor noted  #GF with pink eye - he has not had symptoms  Recommended follow up: Return in about 1 year (around 09/05/2024) for physical or sooner if needed.Schedule b4 you leave.  Lab/Order associations: fasting   ICD-10-CM   1. Preventative health care  Z00.00     2. HYPERLIPIDEMIA  E78.2     3. Hyperglycemia  R73.9     4. Prostate cancer (HCC)  C61     5. Screening for diabetes mellitus  Z13.1     6. Memory loss  R41.3       No orders of the defined types were placed in this encounter.   Return precautions advised.  Garnette Lukes, MD

## 2023-09-07 ENCOUNTER — Other Ambulatory Visit: Payer: Self-pay

## 2023-09-07 MED ORDER — ATORVASTATIN CALCIUM 40 MG PO TABS: 40.0000 mg | ORAL_TABLET | Freq: Every day | ORAL | 3 refills | Status: DC

## 2023-09-14 IMAGING — MR MR PROSTATE WO/W CM
12 series · 48 of 48 positions shown · IV contrast (multihance)
Comparison: None.

CLINICAL DATA: 70-year-old male with elevated PSA equal 12.9.

EXAM:
MR PROSTATE WITHOUT AND WITH CONTRAST
TECHNIQUE: Multiplanar multisequence MRI images were obtained of the pelvis
centered about the prostate. Pre and post contrast images were
obtained.
CONTRAST:  20mL MULTIHANCE GADOBENATE DIMEGLUMINE 529 MG/ML IV SOLN

[Series 3: T2 · coronal · 3.0mm · 0.56mm/px · 1 of 23 slices shown (1 of 3)]
[im 1/23]
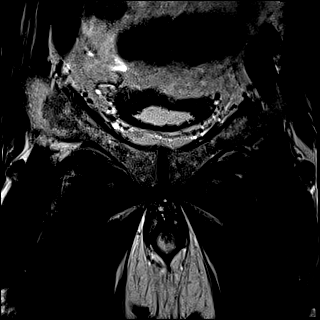

[Series 4: T1 · axial · 5.0mm · 1.25mm/px · 1 of 88 slices shown]
[im 1/88]
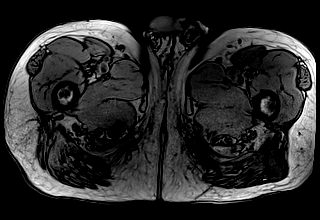

[Series 5: DWI · axial · 3.0mm · 1.75mm/px · 1 of 69 slices shown (1 of 3)]
[im 1/69]
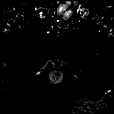

[Series 6: DWI · axial · 3.0mm · 1.75mm/px · 1 of 23 slices shown (2 of 3)]
[im 1/23]
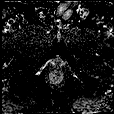

[Series 7: DWI · axial · 3.0mm · 1.75mm/px · 1 of 23 slices shown (3 of 3)]
[im 1/23]
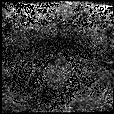

[Series 8: T2 · axial · 3.0mm · 0.56mm/px · 1 of 26 slices shown (2 of 3)]
[im 1/26]
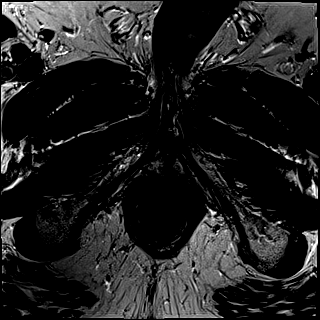

[Series 9: T2 · axial · 1.0mm · 1.04mm/px · z∈[-24,+47]mm · 2 of 72 slices shown (3 of 3)]
[im 1/72]
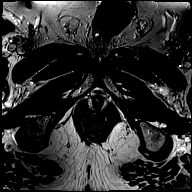
[im 72/72]
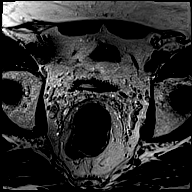

[Series 10: pre t1_twist_tra_dyn · axial · non-contrast · 3.5mm · 0.83mm/px · 1 of 24 slices shown]
[im 1/24]
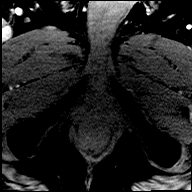

[Series 11: post t1_twist_tra_dyn-copy center · axial · non-contrast · 3.5mm · 0.83mm/px · z∈[-34,+46]mm · 18 of 720 slices shown]
[im 1/720]
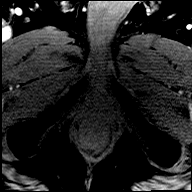
[im 43/720]
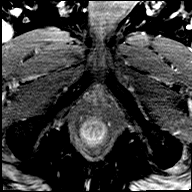
[im 85/720]
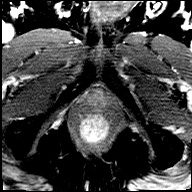
[im 127/720]
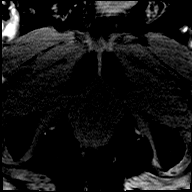
[im 170/720]
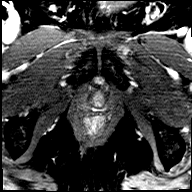
[im 212/720]
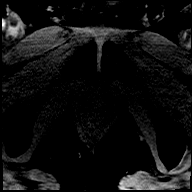
[im 254/720]
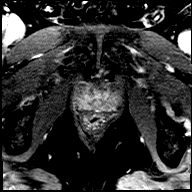
[im 297/720]
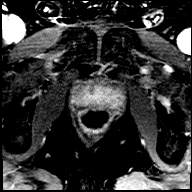
[im 339/720]
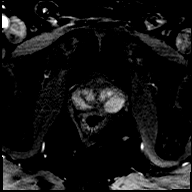
[im 381/720]
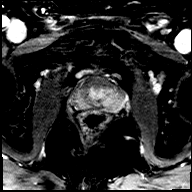
[im 423/720]
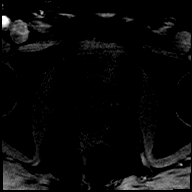
[im 466/720]
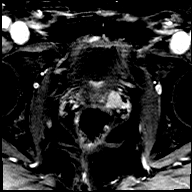
[im 508/720]
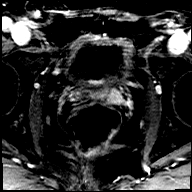
[im 550/720]
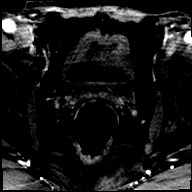
[im 593/720]
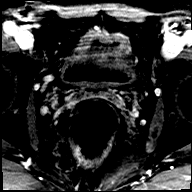
[im 635/720]
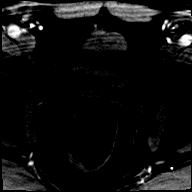
[im 677/720]
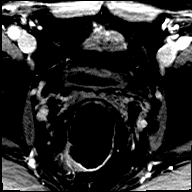
[im 720/720]
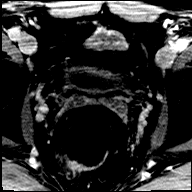

[Series 12: post t1_twist_tra_dyn-copy cent_sub · axial · 3.5mm · 0.83mm/px · z∈[-34,+46]mm · 17 of 690 slices shown]
[im 1/690]
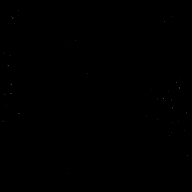
[im 44/690]
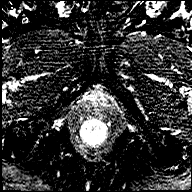
[im 87/690]
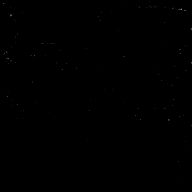
[im 130/690]
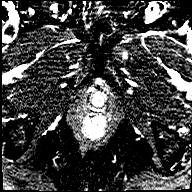
[im 173/690]
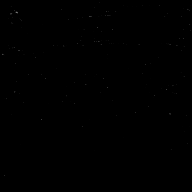
[im 216/690]
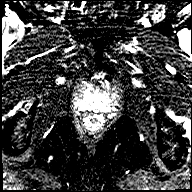
[im 259/690]
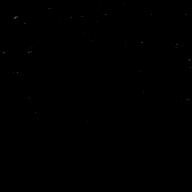
[im 302/690]
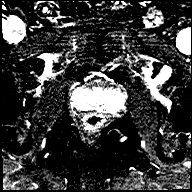
[im 345/690]
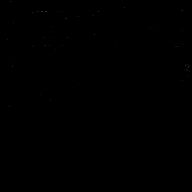
[im 388/690]
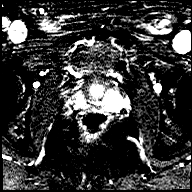
[im 431/690]
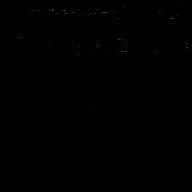
[im 474/690]
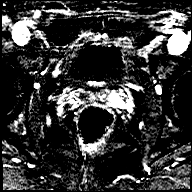
[im 517/690]
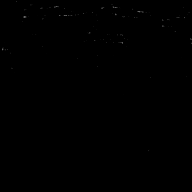
[im 560/690]
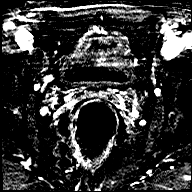
[im 603/690]
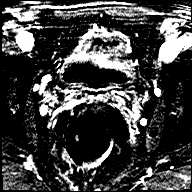
[im 646/690]
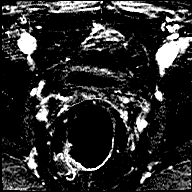
[im 690/690]
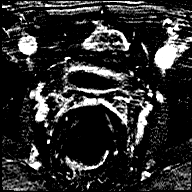

[Series 13: t1_vibe_dixon_tra_f · axial · 2.5mm · 0.91mm/px · z∈[-59,+138]mm · 2 of 80 slices shown]
[im 1/80]
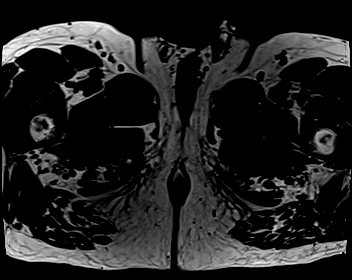
[im 80/80]
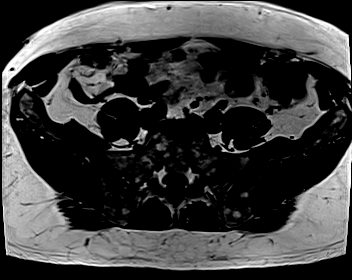

[Series 14: t1_vibe_dixon_tra_w · axial · 2.5mm · 0.91mm/px · z∈[-59,+138]mm · 2 of 80 slices shown]
[im 1/80]
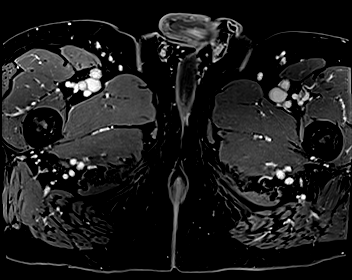
[im 80/80]
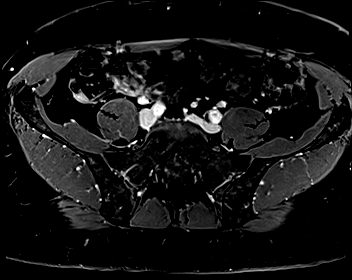

[48 of 48 positions shown; findings below may reference images not displayed]

FINDINGS: Prostate: Central broad region of loss of signal intensity within
the LEFT peripheral zone mid gland measuring 1.4 x 1.2 cm (image
14/series 8). There is a corresponding region of restricted
diffusion measuring 17 mm 13 mm on image 12/series 6.

The transitional zone is relatively small with no suspicious imaging
characteristics on T2 weighted imaging.

The LEFT mid gland peripheral zone lesion described above has
intense on postcontrast enhancement on T1 weighted imaging (image
21/series 11).

The LEFT mid gland lesion does bulge the capsule posteriorly but no
clear extension beyond the capsule border. Neural vascular bundles
appear intact.

Volume: 3.5 x 4.6 x 2.4 cm = 20 cc

Transcapsular spread: Absent. LEFT mid gland lesion bulges the
posterior capsule without clear transcapsular spread.

Seminal vesicle involvement: Absent

Neurovascular bundle involvement: Absent

Pelvic adenopathy: Absent

Bone metastasis: Absent

Other findings: None
IMPRESSION: 1. Lesion in the LEFT mid gland peripheral zone is concerning for
high grade prostate carcinoma. PI-RADS: 4. (Dynacad 3D post
processing performed). ANURAG #1.
2. Mildly nodular transitional zone consistent with benign prostate
hypertrophy. PI-RADS: 2

## 2023-10-13 ENCOUNTER — Encounter: Payer: Self-pay | Admitting: Family Medicine

## 2023-10-16 NOTE — Telephone Encounter (Signed)
 Please see pt msg and BP reading that were requested

## 2023-11-08 DIAGNOSIS — H26493 Other secondary cataract, bilateral: Secondary | ICD-10-CM | POA: Diagnosis not present

## 2023-11-15 ENCOUNTER — Ambulatory Visit: Admitting: Family Medicine

## 2023-11-15 ENCOUNTER — Encounter: Payer: Self-pay | Admitting: Family Medicine

## 2023-11-15 VITALS — BP 100/62 | HR 58 | Temp 97.7°F | Ht 75.0 in | Wt 235.6 lb

## 2023-11-15 DIAGNOSIS — Z8673 Personal history of transient ischemic attack (TIA), and cerebral infarction without residual deficits: Secondary | ICD-10-CM | POA: Diagnosis not present

## 2023-11-15 DIAGNOSIS — R2689 Other abnormalities of gait and mobility: Secondary | ICD-10-CM

## 2023-11-15 DIAGNOSIS — E782 Mixed hyperlipidemia: Secondary | ICD-10-CM

## 2023-11-15 DIAGNOSIS — R9082 White matter disease, unspecified: Secondary | ICD-10-CM | POA: Diagnosis not present

## 2023-11-15 DIAGNOSIS — R0602 Shortness of breath: Secondary | ICD-10-CM | POA: Diagnosis not present

## 2023-11-15 NOTE — Patient Instructions (Addendum)
 We have placed a referral for you today to Dr. Lucia Gaskins with [facility]- please call their # if you do not hear within a week (may be listed below or you may see mychart message within a few days with #).  -lets see how long for you to get visit and possibly get sleep apnea testing through them- if its going of the be more than a month or two for the first visit- I can also refer to pulmonary for sleep apnea testing  EKG reassuring (slightly low heart rate but do not think that would account for symptoms) - lets see what Dr. Lucia Gaskins thinks but if we are not finding answers consider cardiology consults- if shortness of breath worsens seek care- or if you get new onset chest pain   I suspect B12 better and with other labs being ok- hold off on labs for now  Recommended follow up: Return in about 2 months (around 01/15/2024) for followup or sooner if needed.Schedule b4 you leave. Or longer if have not seen neurology yet- want their opinion first- sooner if any new symptoms though

## 2023-11-15 NOTE — Progress Notes (Signed)
 Phone [phone] In person visit   Subjective:   Peter Novak is a 73 y.o. 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    Priority: High   ADD (attention deficit disorder) 10/02/2014    Priority: Medium    Insomnia 10/02/2014    Priority: Medium    Osteoarthritis 10/02/2014    Priority: Medium    Prostate cancer (HCC) 02/12/2013    Priority: Medium    Hyperglycemia 11/30/2009    Priority: Medium    History of melanoma 11/30/2009    Priority: Medium    HYPERLIPIDEMIA 07/10/2007    Priority: Medium    Bradycardia 11/05/2019    Priority: Low   Migraine aura without headache (migraine equivalents) 04/16/2019    Priority: Low   Erectile dysfunction 10/02/2014    Priority: Low   Eustachian tube dysfunction 10/02/2014    Priority: Low   GERD (gastroesophageal reflux disease) 02/12/2013    Priority: Low   Benign neoplasm of adrenal gland 11/30/2009    Priority: Low   Genetic testing 09/21/2021   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     aspirin EC 81 MG tablet Take 1 tablet (81 mg total) by mouth daily. Swallow whole. 30 tablet 12   atorvastatin (LIPITOR) 20 MG tablet Take 1 tablet (20 mg total) by mouth daily. 90 tablet 3   atorvastatin (LIPITOR) 40 MG tablet Take 1 tablet (40 mg total) by mouth daily. 90  tablet 3   Chlorpheniramine Maleate (ALLERGY RELIEF PO) Take 2 mg by mouth daily as needed (allergies).     Cholecalciferol (VITAMIN D3 PO) Take by mouth daily.     desloratadine (CLARINEX) 5 MG tablet Take 1 tablet (5 mg total) by mouth daily. 90 tablet 3   dextroamphetamine (DEXTROSTAT) 5 MG tablet Take 5 mg by mouth as needed.     esomeprazole (NEXIUM) 20 MG capsule Take 1 capsule (20 mg total) by mouth daily. (Patient taking differently: Take 20 mg by mouth as needed.) 90 capsule 2   Multiple Vitamin (MULTIVITAMIN ADULT PO) Take by mouth daily.     polyethylene glycol (MIRALAX / GLYCOLAX) 17 g packet Take 17 g by mouth as needed.     ALPRAZolam (XANAX) 0.5 MG tablet Take 0.25 mg by mouth at bedtime. (Patient not taking: Reported on 11/15/2023)     Cyanocobalamin (VITAMIN B-12 SL) Place under the tongue daily. (Patient not taking: Reported on 11/15/2023)     No current facility-administered medications for this visit.     Objective:  BP 100/62   Pulse (!) 58   Temp 97.7 F (36.5 C)   Ht 6\' 3"  (1.905 m)   Wt 235 lb 9.6 oz (106.9 kg)   SpO2 97%   BMI 29.45 kg/m  Gen: NAD, resting comfortably CV: RRR no murmurs rubs  or gallops Lungs: CTAB no crackles, wheeze, rhonchi Ext: no edema Skin: warm, dry Neuro: Slight intention tremor, no obvious masked facies, somewhat short strides as compared to the past but not obviously shuffling on exam today but reports this is a better day  EKG: sinus bradycardia with rate 50, normal axis, normal intervals, no hypertrophy, no st or t wave changes. EKG unchanged from 07/04/2017- rate was also 50 at that time - sinus brady in 2023 cardiac telemetry as well- no a fib at that time    Assessment and Plan    # Fatigue- over 2 years #Change in gait- progressing #mild shortness of breath  S: Patient reported fatigue and physical on 09/06/2023 but reported seem to be improving. - Also noted some balance issues occasionally shuffling with shortened stride  (we had mentioned discussing with Dr. Lucia Gaskins if any thoughts on possible parkinsonian features which could contribute to fatigue but he wanted to see how he did with restarting exercise club first-did not have resting tremor but mild intention tremor).  Also noted constipation despite staying hydrated-MiraLAX 3 times a week was helpful - Prostate was removed March 2023 due to cancer and PSA has remained undetectable despite extra prostatic extension  -B12 and thyroid were normal at last visit- though B12 low normal and he is currently on B12 3x a week - Reports some memory concerns-opted out of HIV and syphilis for reversible causes of memory loss workup - Reports history of chronic fatigue starting early 2024 if not prior.  Also has history of Lyme disease years ago - Prior blood pressures have been elevated but looks much better today - Had seen ENT in the past for hoarseness, hearing loss, abnormal dental scan follow-up CT of temporal bones reassuring - Had been on alprazolam for sleep with psychiatry-some correlation with memory loss- but as below memory is less of a concern - Also on dextroamphetamine through psychiatry- if anything that may increase energy  -He did have COVID in October 2024-long COVID is a possibility   Today reports after last visit symptoms started to worsen again and had really bad day yesterday and multiple bad days. Gets up - eats breakfast, watch today's visit and then ready to go back to bed. Will go back to bed and get up around noon. Partner reports that he is walking with slow deliberate gait - short strides- puts arm out for balance at times. Exhausted after shower. Feels off balance and eyes get wide. Stopped walking in last week due to concern.  -MRI brain 05/10/22- showed small vessel ischemic change but no changes from 05/18/19.  - in last month also seems winded particularly after shower -he does snore heavily at times  -no new incontinence- doubt idiopathic  intracranial hypertension   He does not feel like memory issues have been as much of concern lately. GF Peter Novak states he is very sharp and has not noted memory issues A/P: Patient with chronic fatigue issues for nearly 2 years-reassuring labs in January physical for the most part -his girlfriend does report issues with snoring-discussed sleep apnea testing through pulmonary but I believe [facility] also does sleep apnea testing and he wants to see Dr. Lucia Gaskins for gait issues so we are going to start there - Also has had some shuffling short gait issues per his girlfriend along with balance issues-seems he worse on some days than others-not too apparent today.  Once again referring to Dr. Lucia Gaskins for her opinion on possible parkinsonian features (  seems to have some essential tremor but no resting tremor as well) -Did have low normal B12 last visit-continue B12 3 times a week but I do not think we need to repeat at this time -Also reports some mild shortness of breath in the last month-EKG reassuring other than sinus bradycardia-we mentioned if above workup reassuring considering cardiology consult or sooner if has worsening features such as shortness of breath or new onset chest pain (no chest pain at this time ( - From fatigue perspective I also wonder about long COVID most recent case of COVID October 2024 -Blood pressure somewhat low today but he has been monitoring at home and typically in the 110s to 120s -Had mentioned prior memory concerns but he seems far less concerned about this today and girlfriend states is not an issue  #hyperlipidemia with history of TIA S: Medication: Atorvastatin 40 mg daily Lab Results  Component Value Date   CHOL 159 09/06/2023   HDL 48.50 09/06/2023   LDLCALC 83 09/06/2023   LDLDIRECT 73 07/09/2020   TRIG 138.0 09/06/2023   CHOLHDL 3 09/06/2023   A/P: Lipids slightly above ideal goal at physical but have encouraged consistency of  medication-consider repeat with next blood work -LDL goal under 70 with TIA history  Recommended follow up: Return in about 2 months (around 01/15/2024) for followup or sooner if needed.Schedule b4 you leave. Future Appointments  Date Time Provider Department Center  01/15/2024 10:00 AM Shelva Majestic, MD [facility]  09/06/2024  9:00 AM Shelva Majestic, MD [facility]    Lab/Order associations:   ICD-10-CM   1. SOB (shortness of breath)  R06.02 EKG 12-Lead    2. History of TIA (transient ischemic attack)  Z86.73 Ambulatory referral to Neurology    3. White matter abnormality on MRI of brain  R90.82 Ambulatory referral to Neurology    4. Shuffling gait  R26.89 Ambulatory referral to Neurology    5. Mixed hyperlipidemia  E78.2       Time Spent: 42 minutes of total time (11:24 AM- 12:10 PM  so 46 minutes total BUT 4 minutes for EKG interpretation/completion) was spent on the date of the encounter performing the following actions: chart review prior to seeing the patient, obtaining history, performing a medically necessary exam, counseling on the workup and potential treatment plan, placing orders, and documenting in our EHR.    No orders of the defined types were placed in this encounter.   Return precautions advised.  Tana Conch, MD

## 2023-11-20 DIAGNOSIS — H26493 Other secondary cataract, bilateral: Secondary | ICD-10-CM | POA: Diagnosis not present

## 2023-12-01 ENCOUNTER — Encounter: Payer: Self-pay | Admitting: Family Medicine

## 2023-12-01 DIAGNOSIS — R471 Dysarthria and anarthria: Secondary | ICD-10-CM

## 2023-12-01 DIAGNOSIS — Z8673 Personal history of transient ischemic attack (TIA), and cerebral infarction without residual deficits: Secondary | ICD-10-CM

## 2023-12-05 ENCOUNTER — Ambulatory Visit: Admitting: Family Medicine

## 2023-12-05 ENCOUNTER — Ambulatory Visit: Admitting: Neurology

## 2023-12-05 ENCOUNTER — Other Ambulatory Visit: Payer: Self-pay

## 2023-12-05 ENCOUNTER — Encounter: Payer: Self-pay | Admitting: Family Medicine

## 2023-12-05 ENCOUNTER — Encounter: Payer: Self-pay | Admitting: Neurology

## 2023-12-05 VITALS — BP 100/58 | HR 55 | Temp 98.1°F | Ht 74.0 in | Wt 226.6 lb

## 2023-12-05 VITALS — BP 102/61 | HR 63 | Ht 74.0 in | Wt 232.6 lb

## 2023-12-05 DIAGNOSIS — I6310 Cerebral infarction due to embolism of unspecified precerebral artery: Secondary | ICD-10-CM | POA: Diagnosis not present

## 2023-12-05 DIAGNOSIS — G4719 Other hypersomnia: Secondary | ICD-10-CM

## 2023-12-05 DIAGNOSIS — R0683 Snoring: Secondary | ICD-10-CM | POA: Diagnosis not present

## 2023-12-05 DIAGNOSIS — R4701 Aphasia: Secondary | ICD-10-CM | POA: Diagnosis not present

## 2023-12-05 DIAGNOSIS — R471 Dysarthria and anarthria: Secondary | ICD-10-CM

## 2023-12-05 DIAGNOSIS — Z8673 Personal history of transient ischemic attack (TIA), and cerebral infarction without residual deficits: Secondary | ICD-10-CM | POA: Diagnosis not present

## 2023-12-05 DIAGNOSIS — R3589 Other polyuria: Secondary | ICD-10-CM | POA: Diagnosis not present

## 2023-12-05 DIAGNOSIS — E782 Mixed hyperlipidemia: Secondary | ICD-10-CM

## 2023-12-05 DIAGNOSIS — R3915 Urgency of urination: Secondary | ICD-10-CM | POA: Diagnosis not present

## 2023-12-05 LAB — URINALYSIS, ROUTINE W REFLEX MICROSCOPIC
Hgb urine dipstick: NEGATIVE
Leukocytes,Ua: NEGATIVE
Nitrite: NEGATIVE
RBC / HPF: NONE SEEN (ref 0–?)
Specific Gravity, Urine: 1.025 (ref 1.000–1.030)
Urine Glucose: NEGATIVE
Urobilinogen, UA: 1 (ref 0.0–1.0)
pH: 5.5 (ref 5.0–8.0)

## 2023-12-05 NOTE — Patient Instructions (Addendum)
 Please stop by lab before you go- just for urine If you have mychart- we will send your results within 3 business days of [place] receiving them.  If you do not have mychart- we will call you about results within 5 business days of [place] receiving them.  *please also note that you will see labs on mychart as soon as they post. I will later go in and write notes on them- will say "notes from Dr. Durene Cal"   Team get him a water bottle please  Please increase water intake- we want that blood pressure higher- can even loosen salt usage up some and use more- monitor blood pressure to want to keep <135/85  Recommended follow up: Return for next already scheduled visit or sooner if needed.

## 2023-12-05 NOTE — Progress Notes (Unsigned)
 [facility]  Provider:  Dr Peter Novak Referring Provider: Arlice Colt, Novak Peter Novak Primary Care Physician:  Peter Novak  CC:  Abnormal white matter in the brain chronic microvascular changes  12/05/2023: Patient here after f/u with Peter hunter at last appointment BP was elevated, then was taking BP since Jan ranging from 94-139/51-77 Jan to march, in April has been very low 4/6 91/44, 106/53, 102/57 no reading from 4/1 when had possible TIA. On 4/1 he went to [place] he got in the car (at baseline, no illnesses at the time), he couldn;t get words out, no dizziness, no SOB, just couldn;t talk for 10-15 minutes, couldn;t get anything out he was trying to think of canoe and couldn;t get it, couldn;t give him directions, the driver was very concerned, no words at all, was trying very hard, then resolved, he remembers everything, no strenuous activity, no weakness, no facial droop, no paresthesias, no confusion or altered mental status. He called his wife Peter Novak who is here today and provides information. Wife says he was coherent during the episode. He had to lay down today due to dizziness and low BP.  Fatigue, napping, unsteady speech.  08/09/2022: Reviwed work up with patient: had stroke workup, likely TIA, was not on ASA now on ASA daily(stopped plavix), Overall echocardiogram looks good, nothing concerning from a stroke or neurology perspective. He has some grade 1 diastolic dysfunction and trivial valve regurg. FYI thanks Peter Novak (Have a great weekend) , cardiac monitoring 30 days did not show afib, CTA did not show any concern for large vessel stenosis, MRI brain w/o stroke stable white matter changes. If happens again, call 911, then consider loop recorder he wants to wait on the loop recorder.  No significant snoring, wakes 2-3x to urinate, he will nap in the daytime, feels refereshed. ESS 5 no sleep apnea  05/17/2022: cardiac monitor 30 days Conclusions: No atrial  fibrillation  MRI brain: STUDY DATE: 05/10/22 PATIENT NAME: Peter Novak DOB: 1951/03/03 MRN: [id number]   ORDERING CLINICIAN: Anson Fret, Novak  CLINICAL HISTORY: 73 y.o. year old male with: 1. Expressive aphasia   2. Acute embolic stroke (HCC)   3. Monocular vision loss       EXAM: MR BRAIN WO CONTRAST  TECHNIQUE: MRI of the brain without contrast was obtained utilizing multiplanar, multiecho pulse sequences. CONTRAST: none COMPARISON: 05/18/19 MRI   IMAGING SITE: [facility] AT [address] [place] [address]       FINDINGS:    No abnormal lesions are seen on diffusion-weighted views to suggest acute ischemia. The cortical sulci, fissures and cisterns are normal in size and appearance. Lateral, third and fourth ventricle are normal in size and appearance. No extra-axial fluid collections are seen. No evidence of mass effect or midline shift.  Moderate periventricular subcortical and pontine chronic small vessel ischemic disease.   On sagittal views the posterior fossa, pituitary gland and corpus callosum are unremarkable. No evidence of intracranial hemorrhage on SWI views. The orbits and their contents, paranasal sinuses and calvarium are unremarkable.  Intracranial flow voids are present.     IMPRESSION:    MRI brain without contrast demonstrating: -Moderate periventricular, subcortical and pontine chronic small vessel ischemic disease. -No acute findings.  No change from 05/18/2019.  CTA H&N 05/09/2022: 1. No acute intracranial pathology. Stable chronic small vessel ischemic change. 2. Patent vasculature of the head and neck with no hemodynamically significant stenosis or  occlusion. No significant atherosclerotic disease is seen.   Echo 05/26/2022: IMPRESSIONS     1. Left ventricular ejection fraction, by estimation, is 55 to 60%. The  left ventricle has normal function. The left ventricle has no  regional  wall motion abnormalities. Left ventricular diastolic parameters are  consistent with Grade I diastolic  dysfunction (impaired relaxation).   2. Right ventricular systolic function is normal. The right ventricular  size is normal.   3. The mitral valve is normal in structure. Trivial mitral valve  regurgitation. No evidence of mitral stenosis.   4. The aortic valve is tricuspid. Aortic valve regurgitation is trivial.  No aortic stenosis is present.   5. The inferior vena cava is normal in size with greater than 50%  respiratory variability, suggesting right atrial pressure of 3 mmHg.   6. Agitated saline contrast bubble study was negative, with no evidence  of any interatrial shunt.   Comparison(s): No prior Echocardiogram.   Patient complains of symptoms per HPI as well as the following symptoms: none . Pertinent negatives and positives per HPI. All others negative   05/04/2022: 73 y.o. male here as a new request from Peter Novak for ataxia, dizziness, changes of visual perception, abnormal white matter changes of brain. 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 Dorma Russell ENT., Past medical history of K-Helix piston ossiculoplasty, malleus ro neomembrane, right tympanoplasty, incus implant, HTN, HLD, Pre-diabetes.  He has advanced for age white matter changes in the brain.He had difficulty couldn;t see peripherally out of his right eye. Just happened Monday about a week ago. This was VERY different than his migraine with aura episodes, not the same, also could not write and had monocular vision loss. No sensory changes, no weakness, no facial droop.   Last Monday had an episode of "going haywire" no recent illnesses or changes in medications. He was taken off of Aspirin. He was walking around and thinking, and went to tell his wife and for 20 minutes he felt confused, would start to say something and nothing would come out, knew what he  was trying to say. He would be come frustrated trying to talk, his wife provides much information he couldn;t get the information out, he was aware, 15-20 minutes, slowly it resolved, he wrote a note "what is wrong cannot" illegible writing then "go to doctor". Slowly it resolved. Then he was fine, he was able to make short sentences then got better. He took 2 aspirin after that. He slept well and no further problems.   Patient complains of symptoms per HPI as well as the following symptoms: expressive aphasia . Pertinent negatives and positives per HPI. All others negative   Interval history Jan 13, 2021: Patient with microvascular ischemia on MRI, working with his PCP with his hyperlipidemia trying to get it less than 70 LDL, also on aspirin 81 mg.  Recently increased his atorvastatin.  Still having ongoing dizziness and he has been extensively evaluated by ENT and neurology, possibly an orthostatic element and they discussed hydration, I also discussed bradycardia that could contribute, but episodes only occur every 4 to 6 weeks, TSH was normal in March, he still takes meloxicam for osteoarthritis, and he is being treated for hyperglycemia/prediabetes with slightly trending down A1c.  Last A1c was 5.7, 6 months ago, 3 years ago it was 6.2. His memory Is fine. The end of feb/march he had ocular migraine with the swimmy vision, aura 3  days in a row. He also had a headache after the aura. Tylenol helped. Completely reversed. He snores but he is not excessively tired, not snoring all the time, only sometimes, really does not think he has OSA, but discussed.   Patient complains of symptoms per HPI as well as the following symptoms: ocular migraines . Pertinent negatives and positives per HPI. All others negative   Interval history 04/16/2019: 73 y.o. male here as a new request from Peter Novak for ataxia, dizziness, changes of visual perception, abnormal white matter changes of brain. 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 Dorma Russell ENT., Past medical history of K-Helix piston ossiculoplasty, malleus ro neomembrane, right tympanoplasty, incus implant, HTN, HLD, Pre-diabetes.  He has advanced for age white matter changes in the brain.  He has migraines, prisms in his vision, "pixilations"in his vision, squirly that occurred around July in the setting of bike riding, he has had this in the past as well consistent with his prior episodes, he also has difficulty talking, as far back as 2007, always the same and followed by a headache. He had this multiple times in July, usually in the right eye but occurred in the left eye. The one in the left eye he had a headache, not excruciating, he has not had one since then, has difficulty with speech, 10 minutes usually. In the mornings when he stands up he feel dizzy briefly until he starts walking around. Likely orthostatic, discussed conservative measures. He is photosensitive, dizzy which is common in migraineurs. He used to see Peter. Dorma Russell for his hearing he has an implant in the right ear helix coil. We will recommend another opinion for his white matter changes. He denies any cognitive issues, he is active, looks much younger than stated age.   I reviewed notes from his inpatient stay November 2018, his girlfriend found him at the bedside, he had word finding difficulty lasting for 10 minutes and then started to have a headache which is similar to his previous headache with visual, aphasia or 8 to 9 years ago.  He had 2 episodes of migraine with aura one with visual normal speech.  Repeat imaging was stable.  EEG was normal.  Neurologic exam was normal.  He was diagnosed with complicated migraine less likely TIA a is all work-up negative per vascular neurologist Peter. Roda Shutters.  He was asked to take aspirin 81 mg daily, prior to admission he was only taking it twice a week.  He was also discharged on Lipitor 20 mg daily (prior  was only taking it twice a week).  Also advised ongoing aggressive stroke risk factor management.  Also addressed advanced white matter disease with a frontal predominance where is developing confluent signal abnormality, nonspecific pattern, likely from chronic microvascular disease at this age and uncontrolled risk factors.  Echocardiogram was unremarkable for features concerning for stroke risks, he did have mild pulmonary hypertension,  abnormal left ventricular relaxation grade 1 diastolic dysfunction, I do not see TEE.   I reviewed MRI of the brain images completed November 2018 which showed no significant changes.  I also reviewed CTA of the head and neck reports which showed no acute intracranial process or significant stenosis.  Interval history 02/10/2016: 73 y.o. male here as a referral from Peter Novak for ataxia, dizziness, changes of visual perception. 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 .  He has advanced for age white matter changes in the brain. HgbA1c 6.0, LDL was elevated at 137. Today he brings in labs values since 1994 which showed he has had hyperlipidemia since then. He also had a 10 year. Of significant stress.. He has taken Adderall for many years in the past which can elevate blood pressure but trends in the clinic recently and at home have been normal. CTA of the head and neck showed no significant extracranial or intracranial stenosis of significance. Reviewed CTA of the head and neck which showed no significant atheroscleoris. He comes in with girlfriend today, reviewed MRi images and discussed findings with wife who was not here at last appointment.   CTA of the head and neck 01/18/2016: No extracranial or intracranial stenosis or occlusion of significance.   No abnormal postcontrast enhancement.   Hypoattenuation of white  matter, better visualized on MR, consistent with advanced chronic microvascular ischemic change.    HPI:  Peter Novak is a 73 y.o. male here as a referral from Peter Novak for ataxia, dizziness, changes of visual perception. 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 . He denies HTN, Diabetes, HLD, smoking, alcohol use or other vascular risk factors. He had some dizziness earlier this year in March, within 15 minutes of waking up he would have some leaning to one side, spinning feeling, he would have some imbalance on walking which would resolve. It happened for several days otherwise nothing else happened. Had several episodes. The end of April he had it for 3 days continuously, dizziness, lightheaded, no room spinning, no CP, no SOB, no weakness, no other focal neurologic deficits. No episodes since April. No FHx of autoimmune disorders, parents died in their 81s, he is generally healthy. No inciting events, no head trauma or previous illnesses. No travel overseas. Nothing made the symptoms worse or better. No history of neurologic symptoms. No family history of autoimmune disorders, neurodegenerative, diseases. No other focal neurologic deficits, no dysarthria, no dysphagia, no aphasia, no vision changes, no weakness or sensory changes. Currently he denies no gait abnormalities. He has had worsening left hearing changes with fullness in the ear. Fullness in the ear feels better with Valsalva. Denies headaches, no family history of personal history of migraines.  Reviewed notes, labs and imaging from outside physicians, which showed: Reviewed notes by Peter. Maximino Sarin. Seen in the office complaining of dizziness as a rotation torque sensation. Symptoms began 3-4 months ago spells in early March. Spells occurred in the morning or in the afternoon. Wobbly. No  nausea or vomiting. He does describe decreased hearing in the left ear with a feeling of constant fullness. Feels better with Valsalva. He has a water-like feeling in both ears but no fluid draining out of either ear. Denied headaches, migraine disease. Exam reviewed patient's right tympanic membrane is stable, no middle ear effusion, no sign of infection, implant appears in place, asymmetric high frequency sensorineural hearing loss, right ear greater than left. 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  MRI of the brain 12/16/2015 (personally reviewed images and agree with the following) Normal and symmetric labyrinthine signal. No thickening or enhancement along the vestibular cochlear nerves to suggest schwannoma. No enlarged endolymphatic sac. No specific explanation for ataxia - the cerebellum and brainstem has normal  size and signal. Normal appearance of the vertebral and basilar arteries.   Extensive for age white matter disease in the cerebral hemispheres with a frontal predominance where there is developing confluent signal abnormality. The pattern is nonspecific but usually from chronic microvascular disease at this age. There is no specific demyelinating pattern.   IMPRESSION: 1. No explanation for the provided history. 2. Moderate white matter disease, nonspecific pattern but usually from chronic microvascular ischemia.    Review of Systems: Patient complains of symptoms per HPI as well as the following symptoms: ringing in ears, spinning sensation, dizziness . Pertinent negatives per HPI. All others negative.   Social History   Socioeconomic History   Marital status: Married    Spouse name: Not on file   Number of children: Not on file   Years of education: Not on file   Highest education level: Not on file  Occupational History   Not on file  Tobacco Use   Smoking status: Never    Passive exposure: Never   Smokeless tobacco: Never   Vaping Use   Vaping status: Never Used  Substance and Sexual Activity   Alcohol use: Yes    Alcohol/week: 1.0 - 2.0 standard drink of alcohol    Types: 1 - 2 Standard drinks or equivalent per week    Comment: social only    Drug use: No   Sexual activity: Not on file  Other Topics Concern   Not on file  Social History Narrative   Lavon Paganini 2021. Married 2006, divorced 2010. 2 children (35 and 44 in 2016 in good health), no grandkids.    [employer] grad.       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       Hobbies: place in [place] in mountains, outdoor activities, reading, follow things on PC      Right handed      Lives at home with wife and dog       Caffeine: 2 cups/day   Social Drivers of Corporate investment banker Strain: Not on file  Food Insecurity: Not on file  Transportation Needs: Not on file  Physical Activity: Not on file  Stress: Not on file  Social Connections: Not on file  Intimate Partner Violence: Not on file    Family History  Problem Relation Age of Onset   Diverticulosis Mother    Diabetes Father        AAA   Sudden death Brother 17       CAD; MI @ 98, smoker, sedentary, overweight, diabetes, HLD   Diabetes Brother    Hyperlipidemia Brother    Cancer Paternal Grandmother 59       unknown type   Heart attack Paternal Grandfather 36   Hashimoto's thyroiditis Daughter    Colon cancer Maternal Aunt        dx. 60s   Breast cancer Cousin 60   Stroke Neg Hx    Neuropathy Neg Hx    Multiple sclerosis Neg Hx    Migraines Neg Hx    Dementia Neg Hx    Colon polyps Neg Hx    Crohn's disease Neg Hx    Esophageal cancer Neg Hx    Rectal cancer Neg Hx    Stomach cancer Neg Hx    Ulcerative colitis Neg Hx     Past Medical History:  Diagnosis Date   ADD (attention deficit disorder with hyperactivity)    Allergy  seasonal   Arthritis    Cancer [facility])    prostate cancer   Cataract     bilateral,removed   Complication of anesthesia    Constipation    GERD (gastroesophageal reflux disease)    Hearing loss    History of skin cancer    Melanoma X 2   Hyperlipidemia    Lyme disease 01/2021    Past Surgical History:  Procedure Laterality Date   CATARACT EXTRACTION, BILATERAL  March 2022 and April 2022   CHOLECYSTECTOMY  2005   COLONOSCOPY  2008   negative   ELBOW SURGERY  1994   HERNIA REPAIR  2005   INNER EAR SURGERY  2006   Incus transposition;oscillculoplasty; Peter Doran Heater EAR SURGERY  2011   Stirrup resection   LYMPHADENECTOMY Bilateral 11/04/2021   Procedure: LYMPHADENECTOMY, PELVIC;  Surgeon: Heloise Purpura, Novak;  Location: [facility];  Service: Urology;  Laterality: Bilateral;   POLYPECTOMY     ROBOT ASSISTED LAPAROSCOPIC RADICAL PROSTATECTOMY N/A 11/04/2021   Procedure: XI ROBOTIC ASSISTED LAPAROSCOPIC RADICAL PROSTATECTOMY LEVEL 3;  Surgeon: Heloise Purpura, Novak;  Location: [facility];  Service: Urology;  Laterality: N/A;   SHOULDER ARTHROSCOPY  2004   Peter Turner Daniels   TONSILLECTOMY AND ADENOIDECTOMY     VASECTOMY      Current Outpatient Medications  Medication Sig Dispense Refill   acetaminophen (TYLENOL) 325 MG tablet Take 650 mg by mouth as needed for moderate pain.     ALPRAZolam (XANAX) 0.5 MG tablet Take 0.25 mg by mouth at bedtime.     aspirin EC 81 MG tablet Take 1 tablet (81 mg total) by mouth daily. Swallow whole. 30 tablet 12   atorvastatin (LIPITOR) 40 MG tablet Take 1 tablet (40 mg total) by mouth daily. 90 tablet 3   Chlorpheniramine Maleate (ALLERGY RELIEF PO) Take 2 mg by mouth daily as needed (allergies).     Cholecalciferol (VITAMIN D3 PO) Take by mouth daily.     Cyanocobalamin (VITAMIN B-12 SL) Place under the tongue daily.     desloratadine (CLARINEX) 5 MG tablet Take 1 tablet (5 mg total) by mouth daily. 90 tablet 3   dextroamphetamine (DEXTROSTAT) 5 MG tablet Take 5 mg by mouth as needed.     esomeprazole (NEXIUM) 20 MG capsule Take 1  capsule (20 mg total) by mouth daily. (Patient taking differently: Take 20 mg by mouth as needed.) 90 capsule 2   Multiple Vitamin (MULTIVITAMIN ADULT PO) Take by mouth daily.     polyethylene glycol (MIRALAX / GLYCOLAX) 17 g packet Take 17 g by mouth as needed.     atorvastatin (LIPITOR) 20 MG tablet Take 1 tablet (20 mg total) by mouth daily. (Patient not taking: Reported on 12/05/2023) 90 tablet 3   No current facility-administered medications for this visit.    Allergies as of 12/05/2023   (No Known Allergies)    Vitals: BP 102/61   Pulse 63   Ht 6\' 2"  (1.88 m)   Wt 232 lb 9.6 oz (105.5 kg)   BMI 29.86 kg/m  Last Weight:  Wt Readings from Last 1 Encounters:  12/05/23 232 lb 9.6 oz (105.5 kg)   Last Height:   Ht Readings from Last 1 Encounters:  12/05/23 6\' 2"  (1.88 m)  Exam: NAD, pleasant                  Speech:    Speech is normal; fluent and spontaneous with normal comprehension.  Cognition:  The patient is oriented to person, place, and time;     recent and remote memory intact;     language fluent;    Cranial Nerves:    The pupils are equal, round, and reactive to light.Trigeminal sensation is intact and the muscles of mastication are normal. The face is symmetric. The palate elevates in the midline. Hearing intact. Voice is normal. Shoulder shrug is normal. The tongue has normal motion without fasciculations.   Coordination:  No dysmetria  Motor Observation:    No asymmetry, no atrophy, and no involuntary movements noted. Tone:    Normal muscle tone.     Strength:    Strength is V/V in the upper and lower limbs.      Sensation: intact to LT       Assessment/Plan:  73 y.o. 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  CTA of the head and neck Needs a loop recorder.  Sleep evaluation: Used to snore heavily and wake himself up, not as much but very fatigued and napping during the day and TIA: Sleep evaluation  Hypomimia? Gait diorder (hypotension vs other etiology?)  increased tone left arm. Pupils pin point. MRi of the brain and possibly DAT scan. Parkinsonism. NOT on BP medications.   Decreased right arm swing  - acute episode of expressive aphasia, right monocular vision loss, inability to write, very suspicious for embolic stroke/ TIA need stroke workup asap, has multiple vascular risk factors: workup did not show a stroke, likely a TIA  MRI brain stroke protocol - stable white matter changes no stroke (see below) CTA of the head and neck - no significant large vessel stenosis Echocardiogram heart with bubbles study - neg for pfo 30-day cardiac monitor - neg for afib Consider loop recorder to monitor for afib - he declines for now, gave him information to read  - MRI of the brain 2023, 2018 and 2020 showed stable advanced-for-age white matter disease in the cerebral hemispheres with a frontal and left hemispheric predominance where there is developing confluent signal abnormality. The pattern is nonspecific but usually from chronic microvascular disease at this age. He is managing his HLD,obesity,  pre-diabetes. High blood pressure over certain number of years possibly in the setting of Adderall may have contributed as well.  - Denies any symptoms of OSA or cognitive decline.  - CTA of the head and neck 2017 showed No extracranial or intracranial stenosis or occlusion of significance. Repeat 2018 and 2023 also did not show large vessel disease   -  asa 81mg  daily   - cont managing risk factors  I had a long d/w patient about increased risk for stroke/TIAs,  personally independently reviewed imaging studies and stroke evaluation results and answered questions.Continue ASA daily for stroke prevention and maintain strict control of hypertension with blood pressure goal below 130/90, diabetes with hemoglobin A1c goal below 6.5% and lipids with LDL cholesterol goal below 70 mg/dL.I also advised the patient to eat a healthy diet with plenty of whole grains, cereals, fruits and vegetables, exercise regularly and maintain ideal body weight .   Cc: Peter Novak Peter. Ruta Hinds, Novak  [facility]  [address] [place], [place] [address]  Phone [phone] Fax [phone]  I spent 40  minutes of face-to-face and non-face-to-face time with patient on the  No diagnosis found.    diagnosis.  This included previsit chart review, lab review, study review, order entry, electronic health record documentation, patient education on the different diagnostic and therapeutic options, counseling and coordination of care, risks and benefits of management, compliance, or risk factor reduction

## 2023-12-05 NOTE — Progress Notes (Signed)
 Phone [phone] In person visit   Subjective:   Peter Novak is a 73 y.o. 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    Priority: High   ADD (attention deficit disorder) 10/02/2014    Priority: Medium    Insomnia 10/02/2014    Priority: Medium    Osteoarthritis 10/02/2014    Priority: Medium    Prostate cancer (HCC) 02/12/2013    Priority: Medium    Hyperglycemia 11/30/2009    Priority: Medium    History of melanoma 11/30/2009    Priority: Medium    HYPERLIPIDEMIA 07/10/2007    Priority: Medium    Bradycardia 11/05/2019    Priority: Low   Migraine aura without headache (migraine equivalents) 04/16/2019    Priority: Low   Erectile dysfunction 10/02/2014    Priority: Low   Eustachian tube dysfunction 10/02/2014    Priority: Low   GERD (gastroesophageal reflux disease) 02/12/2013    Priority: Low   Benign neoplasm of adrenal gland 11/30/2009    Priority: Low   Genetic testing 09/21/2021   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     aspirin EC 81 MG tablet Take 1 tablet (81 mg total) by mouth daily. Swallow whole. 30 tablet 12   atorvastatin (LIPITOR) 40 MG tablet Take 1 tablet (40 mg total) by mouth daily. 90 tablet 3   Cholecalciferol (VITAMIN D3 PO) Take by mouth daily.     Cyanocobalamin (VITAMIN B-12 SL) Place under the tongue daily.      dextroamphetamine (DEXTROSTAT) 5 MG tablet Take 5 mg by mouth as needed.     esomeprazole (NEXIUM) 20 MG capsule Take 1 capsule (20 mg total) by mouth daily. (Patient taking differently: Take 20 mg by mouth as needed.) 90 capsule 2   fluticasone (FLONASE) 50 MCG/ACT nasal spray Place 2 sprays into both nostrils daily.     Multiple Vitamin (MULTIVITAMIN ADULT PO) Take by mouth daily.     polyethylene glycol (MIRALAX / GLYCOLAX) 17 g packet Take 17 g by mouth as needed.     ALPRAZolam (XANAX) 0.5 MG tablet Take 0.25 mg by mouth at bedtime. (Patient not taking: Reported on 12/05/2023)     No current facility-administered medications for this visit.     Objective:  BP (!) 100/58   Pulse (!) 55   Temp 98.1 F (36.7 C)   Ht 6\' 2"  (1.88 m)   Wt 226 lb 9.6 oz (102.8 kg)   SpO2 97%   BMI 29.09 kg/m  Gen: NAD, resting comfortably CV: RRR no murmurs rubs or gallops Lungs: CTAB no crackles, wheeze, rhonchi Abdomen: soft/nontender/nondistended/normal bowel sounds. No rebound or guarding.  Ext: no edema Skin: warm, dry Neuro: CN II-XII intact-other than baseline known hearing loss, sensation and reflexes normal throughout, 5/5 muscle strength in bilateral upper and lower extremities. Normal finger to nose. No pronator drift. Normal  romberg. Slow shuffling gait. Less expression in face concern for masked facies     Assessment and Plan     # History of suspected TIA with dysarthria S:from mychart on "On Tuesday 4/1 while riding in the car (not driving) with my son, I experienced what I would define as a TIA. 1:15-1:30ish. [place], just walking the property, no heavy activity.  Upon leaving in the car, we were talking and suddenly I couldn't form words and create sentences.  This lasted about 10-15 minutes with nothing, then I was talking again. No other things occurred no aura or wavy visual lines, we continued to drive and wearing a seat belt I  have no indication of trouble with movement.  I felt OK. Returned home and took a nap. Have felt OK the past few days though tired.  I am at a total loss in trying to understand this considering the blood testing, EKG and conversation with you.  I currently have an appt with Dr. Lucia Gaskins on 5/15.  Please forward this to her. FYI...my son was scared silly. I did not seek medical assistance due to our location. Thanks .......[name] KitchenLawernce Novak  "   Working with Dr. Lucia Gaskins we have ordered cardiology referral as well as MR brain to rule out bleed with plan for aspirin/Plavix if no bleed based on concern for TIA.  He reports blood pressure was running lower after episode and seems to be running lower again today-neurology also noted this. Not the best at hydration  Patient continues to complain of gait change, instability.  Long-term girlfriend has noted less expression in his face over the years A/P: Suspected TIA-MRI of the brain was ordered but will need note from today for stat scheduling to be obtained -Dr. Lucia Gaskins appears to be reordering CT angio neck and head potentially as well - mention of possible sleep study-his snoring has been better but still has excessive daytime sleepiness - With gait abnormality, concern for masked facies, softened voice-neurology is considering DAT scan to evaluate for Parkinson's disease-I certainly would agree with this - From Dr. Darreld Mclean AVS: "After imaging we may switch you back to Plavix after a few weeks of aspirin and plavix together" -Cardiology scheduled patient but unfortunately not until July-we are requesting their opinion on loop recorder-offered short-term cardiac monitoring but he declines for now - No cleared slurred speech today but voice seems strained at times and overall seems softer as time goes on-neurological exam otherwise normal other than concerns for Parkinson's - He reports history of Lyme disease and has had some recurrent tick bites-he asks about this  and I recommended still proceeding with above workup   # Lower blood pressure-patient is actually dealt with some higher blood pressures at times but more recently blood pressure seems to be trending down even without medication - Was orthostatic at neurology earlier today-I recommended increased hydration as well as liberalizing salt as long as blood pressure remains controlled which she will monitor at home - Not on any antihypertensives  # Polyuria and urinary urgency-history of prostate cancer status post prostatectomy 2023-we will check urine culture and urinalysis today  Recommended follow up: Return for next already scheduled visit or sooner if needed. Future Appointments  Date Time Provider Department Center  01/15/2024 10:00 AM Shelva Majestic, MD [facility]  03/18/2024  9:40 AM Orbie Pyo, MD [facility]  09/06/2024  9:00 AM Shelva Majestic, MD [facility]  Lab/Order associations:   ICD-10-CM   1. History of TIA (transient ischemic attack)  Z86.73     2. Dysarthria  R47.1     3. Polyuria  R35.89 Urine Culture    Urinalysis, Routine w reflex microscopic    4. Urinary urgency  R39.15 Urine Culture    Urinalysis, Routine w reflex microscopic      Time Spent: 31 minutes of total time (10:40 AM-11:11 AM) was spent on the date of the encounter performing the following actions: chart review prior to seeing the patient, obtaining history, performing a medically necessary exam, counseling on the workup for TIA plan as well as concerns about Parkinson's, placing orders, and documenting in our EHR.    Return precautions advised.  Tana Conch, MD

## 2023-12-05 NOTE — Patient Instructions (Signed)
 MRI of the brain ordered Repeat CT scans of blood vessels May need a sleep study Also consider a DAT Scan for parkinson's disease After imaging we may switch you back to Plavix after a few weeks of aspirin and plavix together Loop recorder  Implantable Loop Recorder Placement  An implantable loop recorder is a small electronic device that is placed under the skin of the chest. The device records the electrical activity of the heart over a long period of time. Your health care provider can download these recordings to monitor your heart. You may need an implantable loop recorder if you have periods of abnormal heart activity (arrhythmias) or unexplained fainting (syncope). The recorder can be left in place for as long as 3 years. Tell a health care provider about: Any allergies you have. All medicines you are taking, including vitamins, herbs, eye drops, creams, and over-the-counter medicines. Any problems you or family members have had with anesthesia. Any bleeding problems you have. Any surgeries you have had. Any medical conditions you have. Whether you are pregnant or may be pregnant. What are the risks? Your health care provider will talk with you about risks. These may include: Infection. Bleeding. Allergic reactions to anesthesia. Damage to nerves or blood vessels. Failure of the device to work. This could require another surgery to replace it. What happens before the procedure? You may have a physical exam, blood tests, and imaging tests, such as a chest X-ray. Follow instructions from your health care provider about what you may eat and drink Ask your health care provider about: Changing or stopping your regular medicines. These include any diabetes medicines or blood thinners you take. Taking medicines such as aspirin and ibuprofen. These medicines can thin your blood. Do not take them unless your health care provider tells you to. Taking over-the-counter medicines, vitamins,  herbs, and supplements. Ask your health care provider: How your surgery site will be marked. What steps will be taken to help prevent infection. These steps may include: Removing hair at the surgery site. Washing skin with a soap that kills germs. Taking antibiotics. If you will be going home right after the procedure, plan to have a responsible adult: Take you home from the hospital or clinic. You will not be allowed to drive. Do not use any products that contain nicotine or tobacco before the procedure. These products include cigarettes, chewing tobacco, and vaping devices, such as e-cigarettes. If you need help quitting, ask your health care provider What happens during the procedure? An IV will be inserted into one of your veins. You may be given: A sedative. This helps you relax Anesthesia. This will: Numb certain areas of your body. Make you fall asleep for surgery. A small incision will be made on the left side of your upper chest. A pocket will be created under your skin. The device will be placed in the pocket. The incision will be closed with stitches (sutures) or adhesive strips. A bandage (dressing) will be placed over the incision. The procedure may vary among health care providers and hospitals. What happens after the procedure? Your blood pressure, heart rate, breathing rate, and blood oxygen level will be monitored until you leave the hospital or clinic. You may be able to go home on the day of your surgery. Before you go home: Your health care provider will program your recorder. You will learn how to trigger your device with a handheld activator. You will learn how to send recordings to your health care provider.  You will get an ID card for your device, and you will be told when to use it. This information is not intended to replace advice given to you by your health care provider. Make sure you discuss any questions you have with your health care provider. Document  Revised: 01/10/2022 Document Reviewed: 01/10/2022 Elsevier Patient Education  2024 [employer].

## 2023-12-06 LAB — URINE CULTURE
MICRO NUMBER:: 16302974
Result:: NO GROWTH
SPECIMEN QUALITY:: ADEQUATE

## 2023-12-07 ENCOUNTER — Encounter: Payer: Self-pay | Admitting: Family Medicine

## 2023-12-12 ENCOUNTER — Telehealth: Payer: Self-pay

## 2023-12-12 NOTE — Telephone Encounter (Unsigned)
 Copied from CRM [id number]. Topic: Clinical - Medical Advice >> Dec 12, 2023 12:13 PM [place] J wrote: Reason for CRM: Dr. Arlene Ben sent in 3 orders for the patient:  MR BRAIN WO CONTRAST CT ANGIO NECK W OR WO CONTRAST CT ANGIO HEAD W OR WO CONTRAST  Patient stated that no one has contacted him to get these imagine appointments schedule and he would like to know if what to do from here. Patient stated that he is still feeling miserable and would like a call back with further instruction.

## 2023-12-13 ENCOUNTER — Encounter: Payer: Self-pay | Admitting: Family Medicine

## 2023-12-13 ENCOUNTER — Ambulatory Visit (HOSPITAL_COMMUNITY)
Admission: RE | Admit: 2023-12-13 | Discharge: 2023-12-13 | Disposition: A | Discharge status: Home / Self Care | Source: Ambulatory Visit | Attending: Family Medicine | Admitting: Family Medicine

## 2023-12-13 ENCOUNTER — Telehealth: Payer: Self-pay | Admitting: Neurology

## 2023-12-13 DIAGNOSIS — R471 Dysarthria and anarthria: Secondary | ICD-10-CM | POA: Diagnosis not present

## 2023-12-13 DIAGNOSIS — Z8673 Personal history of transient ischemic attack (TIA), and cerebral infarction without residual deficits: Secondary | ICD-10-CM | POA: Diagnosis not present

## 2023-12-13 DIAGNOSIS — G459 Transient cerebral ischemic attack, unspecified: Secondary | ICD-10-CM | POA: Diagnosis not present

## 2023-12-13 NOTE — Telephone Encounter (Signed)
 Pt called to check on if MRI has been sent. Informed patient [facility] will call to schedule appointment

## 2023-12-13 NOTE — Telephone Encounter (Signed)
 Pt was able to go to MRI appts today 12/13/23.

## 2023-12-13 NOTE — Telephone Encounter (Signed)
 Peter Novak Siegfried Dress: [id number] exp. 12/13/23-02/11/24 sent to GI [phone]

## 2023-12-18 DIAGNOSIS — D225 Melanocytic nevi of trunk: Secondary | ICD-10-CM | POA: Diagnosis not present

## 2023-12-18 DIAGNOSIS — L570 Actinic keratosis: Secondary | ICD-10-CM | POA: Diagnosis not present

## 2023-12-18 DIAGNOSIS — L720 Epidermal cyst: Secondary | ICD-10-CM | POA: Diagnosis not present

## 2023-12-18 DIAGNOSIS — D2261 Melanocytic nevi of right upper limb, including shoulder: Secondary | ICD-10-CM | POA: Diagnosis not present

## 2023-12-18 DIAGNOSIS — L820 Inflamed seborrheic keratosis: Secondary | ICD-10-CM | POA: Diagnosis not present

## 2023-12-18 DIAGNOSIS — Z85820 Personal history of malignant melanoma of skin: Secondary | ICD-10-CM | POA: Diagnosis not present

## 2023-12-18 DIAGNOSIS — Z85828 Personal history of other malignant neoplasm of skin: Secondary | ICD-10-CM | POA: Diagnosis not present

## 2023-12-18 DIAGNOSIS — D2262 Melanocytic nevi of left upper limb, including shoulder: Secondary | ICD-10-CM | POA: Diagnosis not present

## 2023-12-18 DIAGNOSIS — L738 Other specified follicular disorders: Secondary | ICD-10-CM | POA: Diagnosis not present

## 2023-12-19 ENCOUNTER — Other Ambulatory Visit: Payer: Self-pay | Admitting: Family Medicine

## 2023-12-19 MED ORDER — PANTOPRAZOLE SODIUM 20 MG PO TBEC: 20.0000 mg | DELAYED_RELEASE_TABLET | Freq: Every day | ORAL | 3 refills | Status: DC | PRN

## 2023-12-19 MED ORDER — CLOPIDOGREL BISULFATE 75 MG PO TABS: 75.0000 mg | ORAL_TABLET | Freq: Every day | ORAL | 3 refills | Status: AC

## 2023-12-20 ENCOUNTER — Ambulatory Visit
Admission: RE | Admit: 2023-12-20 | Discharge: 2023-12-20 | Disposition: A | Discharge status: Home / Self Care | Source: Ambulatory Visit | Attending: Neurology | Admitting: Neurology

## 2023-12-20 ENCOUNTER — Encounter: Payer: Self-pay | Admitting: Radiology

## 2023-12-20 ENCOUNTER — Inpatient Hospital Stay: Admission: RE | Admit: 2023-12-20 | Source: Ambulatory Visit

## 2023-12-20 DIAGNOSIS — R4701 Aphasia: Secondary | ICD-10-CM | POA: Diagnosis not present

## 2023-12-20 DIAGNOSIS — I6310 Cerebral infarction due to embolism of unspecified precerebral artery: Secondary | ICD-10-CM

## 2023-12-20 MED ORDER — IOPAMIDOL (ISOVUE-370) INJECTION 76%
75.0000 mL | Freq: Once | INTRAVENOUS | Status: AC | PRN
  Administered 2023-12-20: 75 mL via INTRAVENOUS

## 2023-12-25 ENCOUNTER — Encounter: Payer: Self-pay | Admitting: Family Medicine

## 2023-12-25 NOTE — Telephone Encounter (Signed)
 Please see pt latest BP readings as an update

## 2023-12-28 ENCOUNTER — Encounter: Payer: Self-pay | Admitting: Neurology

## 2023-12-28 NOTE — Telephone Encounter (Signed)
 I called the [facility] Radiology reading room and LVM asking for expedited imaging review for patient.

## 2023-12-29 ENCOUNTER — Encounter: Payer: Self-pay | Admitting: Neurology

## 2023-12-29 ENCOUNTER — Encounter: Payer: Self-pay | Admitting: Family Medicine

## 2024-01-04 ENCOUNTER — Telehealth: Payer: Self-pay | Admitting: Neurology

## 2024-01-04 NOTE — Telephone Encounter (Signed)
 Pt called to confirm appt .   Comfirm appt details .

## 2024-01-08 ENCOUNTER — Encounter (HOSPITAL_COMMUNITY): Payer: Self-pay

## 2024-01-09 ENCOUNTER — Encounter: Payer: Self-pay | Admitting: Family Medicine

## 2024-01-09 DIAGNOSIS — D649 Anemia, unspecified: Secondary | ICD-10-CM

## 2024-01-10 ENCOUNTER — Other Ambulatory Visit (INDEPENDENT_AMBULATORY_CARE_PROVIDER_SITE_OTHER)

## 2024-01-10 DIAGNOSIS — D649 Anemia, unspecified: Secondary | ICD-10-CM | POA: Diagnosis not present

## 2024-01-11 ENCOUNTER — Ambulatory Visit: Payer: Self-pay | Admitting: Family Medicine

## 2024-01-11 ENCOUNTER — Institutional Professional Consult (permissible substitution): Admitting: Neurology

## 2024-01-11 LAB — IBC + FERRITIN
Ferritin: 32.9 ng/mL (ref 22.0–322.0)
Iron: 132 ug/dL (ref 42–165)
Saturation Ratios: 34.2 % (ref 20.0–50.0)
TIBC: 386.4 ug/dL (ref 250.0–450.0)
Transferrin: 276 mg/dL (ref 212.0–360.0)

## 2024-01-15 ENCOUNTER — Encounter: Payer: Self-pay | Admitting: Family Medicine

## 2024-01-15 ENCOUNTER — Ambulatory Visit: Admitting: Family Medicine

## 2024-01-15 VITALS — BP 130/70 | HR 54 | Temp 98.4°F | Wt 229.0 lb

## 2024-01-15 DIAGNOSIS — Z8673 Personal history of transient ischemic attack (TIA), and cerebral infarction without residual deficits: Secondary | ICD-10-CM

## 2024-01-15 DIAGNOSIS — R5383 Other fatigue: Secondary | ICD-10-CM

## 2024-01-15 DIAGNOSIS — E782 Mixed hyperlipidemia: Secondary | ICD-10-CM

## 2024-01-15 DIAGNOSIS — K219 Gastro-esophageal reflux disease without esophagitis: Secondary | ICD-10-CM

## 2024-01-15 DIAGNOSIS — G4700 Insomnia, unspecified: Secondary | ICD-10-CM

## 2024-01-15 DIAGNOSIS — C61 Malignant neoplasm of prostate: Secondary | ICD-10-CM | POA: Diagnosis not present

## 2024-01-15 MED ORDER — PANTOPRAZOLE SODIUM 40 MG PO TBEC: 40.0000 mg | DELAYED_RELEASE_TABLET | Freq: Every day | ORAL | 5 refills | Status: DC

## 2024-01-15 NOTE — Patient Instructions (Addendum)
 Schedule a lab visit at the check out desk within 2 weeks. Return for future fasting labs meaning nothing but water  after midnight please. Ok to take your medications with water .  -needs to be between 8-9 am  Recommended follow up: Return in about 3 months (around 04/16/2024) for followup or sooner if needed.Schedule b4 you leave.

## 2024-01-15 NOTE — Progress Notes (Signed)
 Phone [phone] In person visit   Subjective:   Peter Novak is a 73 y.o. year old very pleasant male patient who presents for/with See problem oriented charting Chief Complaint  Patient presents with   Hyperlipidemia   Medical Management of Chronic Issues    66mo f/u; doing about the same, still tired with no energy; "unsteady in my walk" but no dizziness    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    Priority: High   ADD (attention deficit disorder) 10/02/2014    Priority: Medium    Insomnia 10/02/2014    Priority: Medium    Osteoarthritis 10/02/2014    Priority: Medium    Prostate cancer (HCC) 02/12/2013    Priority: Medium    Hyperglycemia 11/30/2009    Priority: Medium    History of melanoma 11/30/2009    Priority: Medium    HYPERLIPIDEMIA 07/10/2007    Priority: Medium    Bradycardia 11/05/2019    Priority: Low   Migraine aura without headache (migraine equivalents) 04/16/2019    Priority: Low   Erectile dysfunction 10/02/2014    Priority: Low   Eustachian tube dysfunction 10/02/2014    Priority: Low   GERD (gastroesophageal reflux disease) 02/12/2013    Priority: Low   Benign neoplasm of adrenal gland 11/30/2009    Priority: Low   Genetic testing 09/21/2021   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     ALPRAZolam  (XANAX ) 0.5 MG tablet Take 0.25 mg by mouth at bedtime.     aspirin  EC 81 MG tablet Take 1 tablet (81 mg total) by mouth daily. Swallow whole. 30 tablet  12   atorvastatin  (LIPITOR) 40 MG tablet Take 1 tablet (40 mg total) by mouth daily. 90 tablet 3   Cholecalciferol (VITAMIN D3 PO) Take by mouth daily.     clopidogrel  (PLAVIX ) 75 MG tablet Take 1 tablet (75 mg total) by mouth daily. 90 tablet 3   Cyanocobalamin  (VITAMIN B-12 SL) Place under the tongue daily.     dextroamphetamine (DEXTROSTAT) 5 MG tablet Take 5 mg by mouth as needed.     fluticasone  (FLONASE ) 50 MCG/ACT nasal spray Place 2 sprays into both nostrils daily.     hydrocortisone 2.5 % cream Apply 1 Application topically as needed.     Multiple Vitamin (MULTIVITAMIN ADULT PO) Take by mouth daily.     polyethylene glycol (MIRALAX  / GLYCOLAX ) 17 g packet Take 17 g by mouth as needed.     pantoprazole  (PROTONIX ) 40 MG tablet Take 1 tablet (40 mg total) by mouth daily. 30 tablet 5   No current facility-administered medications for this visit.     Objective:  BP 130/70   Pulse (!) 54   Temp 98.4 F (36.9 C)   Wt 229 lb (103.9 kg)   SpO2 99%   BMI 29.40 kg/m  Gen: NAD, resting comfortably CV: bradycardic but regular no murmurs rubs or gallops Lungs: CTAB no crackles, wheeze, rhonchi Ext: no edema Skin: warm, dry Neuro: searches for  words briefly at times, soft voice     Assessment and Plan   # Ongoing fatigue for 2 years/ shortness of breath  -with TIAs first starting 2010 S: Patient with ongoing fatigue and unsteadiness on his walk-fatigue dates back to at least 2023.  Has also felt winded.  From note 01/27/23 "-Of note patient did have grade 1 diastolic dysfunction and trivial mitral valve regurgitation on echocardiogram/bubble study 05/26/2022 with Dr. Tresia Fruit -Of note is on long-term PPI Nexium  which could decrease B12 levels - PHQ9 of 2 with no feelings being down depressed or hopeless and General Anxiety Disorder - 7 question screening survey only 1 - but points out stressors from the world we live in- very fair -does note pain when he needs to urinate through penis-  resolves with urination -prostate cancer history but will get PSA done with urology in September -mild hoarseness at times/sporadic despite PPI.  -constipation noted. Up to date on colonoscopy 07/08/22. Colace helps some or as needed miralax . " -Also being evaluated for possible parkinsonian features by Dr. Tresia Fruit  -His girlfriend has reported that he does snore and plan has been for sleep apnea evaluation- see below - Has had low normal B12 but now taking B12 3 times a week  -Also having some shortness of breath and we have considered cardiology consultwhich is scheduled for July - We have also considered long COVID  -Also had suspected TIA with dysarthria in early April-has been on a combination of Plavix  and aspirin  81 mg- he's on both until he sees Dr. Tresia Fruit  -Plan has been for loop recorder ideally through cardiology  Today he reports things have been about the same- ongoing fatigue, shortness of breath, instability. In general blood pressure has been more stable and less low- has had a few lows but how he feels hasn't changed based on this. Lowest 92/52 then later up to 112/65- water  or Gatorade helps.  -feels like still leaning forward/tipping forward with walking and holding on more for stability -not feeling strong -weaker voice -family and friends have noticed almost panicked looked in face - hand writing gets smaller as he writes- but not always eligible -still getting up 3-4 x a month to pee- PSA today and sees DrAaron Aas Rozanne Corners soon - gets some lateral back pain trying to get up out of bed -scheduled tomorrow for sleep study through neurology -sleep better with Flonase - Dr. Larkin Plumb recommended- not jittery as much   A/P: 73 year old male with 2 years of fatigue as well as gradually progressive shortness of breath and instability of gait with some shuffling in setting of recurrent TIAs - Upcoming sleep evaluation needs to be completed - Upcoming neurology visit-looking for opinion  from Dr. Tresia Fruit on moving forward with that scan -Upcoming cardiology visit-looking at potential loop recorder to see if rhythm issues particular A-fib contributing to either fatigue or recurrent TIA risk -We will schedule 43-month follow-up   # History of TIA #hyperlipidemia S: Medication: Atorvastatin  40 mg-currently on Plavix  and aspirin  together Lab Results  Component Value Date   CHOL 159 09/06/2023   HDL 48.50 09/06/2023   LDLCALC 83 09/06/2023   LDLDIRECT 73 07/09/2020   TRIG 138.0 09/06/2023   CHOLHDL 3 09/06/2023   A/P: LDL slightly above goal but we have increased his atorvastatin  to 40 mg-likely recheck with labs next visit - With history of TIA prefer LDL under 70.  He would like to hold steady on Plavix  and aspirin  together until he discusses case with  Dr. Tresia Fruit -Discussed Dextrostat not ideal with TIA history-he is on low-dose intermittently and wants to hold steady-this was prescribed by psychiatry    # Insomnia S: Medication: Alprazolam  0.25 mg at bedtime per psychiatry A/P: Discussed alprazolam  could cause memory and balance issues but likely should not persist throughout the day-he would like to hold steady on this dose prescribed by psychiatry  # GERD S:Medication: Nexium  OTC (available over the counter without a prescription) has been more effective than prescription for pantoprazole  20 mg- but Nexium  reduces efficacy of plavix   A/P: Poor control on pantoprazole  20 mg but controlled on Nexium  over-the-counter but this is less than ideal with Plavix -we will increase to pantoprazole  40 mg to see if this is beneficial  # Prediabetes with last A1c this year at 6.2-too soon for repeat today   Recommended follow up: Return in about 3 months (around 04/16/2024) for followup or sooner if needed.Schedule b4 you leave. Future Appointments  Date Time Provider Department Center  01/16/2024 10:00 AM Dohmeier, Raoul Byes, MD [facility] None  01/23/2024  9:30 AM [facility]   02/13/2024  8:00 AM Evelina Hippo, MD [facility] None  03/18/2024  9:40 AM Kyra Phy, MD [facility]  04/16/2024  9:20 AM Almira Jaeger, MD [facility]  09/06/2024  9:00 AM Almira Jaeger, MD [facility]    Lab/Order associations:   ICD-10-CM   1. Fatigue, unspecified type  R53.83 CBC with Differential/Platelet    Comprehensive metabolic panel with GFR    Cortisol    C-reactive protein    2. History of TIA (transient ischemic attack)  Z86.73     3. Mixed hyperlipidemia  E78.2     4. Insomnia, unspecified type  G47.00     5. Gastroesophageal reflux disease without esophagitis  K21.9       Meds ordered this encounter  Medications   pantoprazole  (PROTONIX ) 40 MG tablet    Sig: Take 1 tablet (40 mg total) by mouth daily.    Dispense:  30 tablet    Refill:  5    Return precautions advised.  Clarisa Crooked, MD

## 2024-01-16 ENCOUNTER — Ambulatory Visit: Admitting: Neurology

## 2024-01-16 ENCOUNTER — Encounter: Payer: Self-pay | Admitting: Neurology

## 2024-01-16 VITALS — BP 122/70 | HR 55 | Ht 75.0 in | Wt 230.0 lb

## 2024-01-16 DIAGNOSIS — R5383 Other fatigue: Secondary | ICD-10-CM | POA: Insufficient documentation

## 2024-01-16 DIAGNOSIS — G4719 Other hypersomnia: Secondary | ICD-10-CM | POA: Insufficient documentation

## 2024-01-16 NOTE — Progress Notes (Signed)
 SLEEP MEDICINE CLINIC    Provider:  Neomia Banner, MD  Primary Care Physician:  Almira Jaeger, MD [address] [place] [address]     Referring Provider: Glory Larsen, Md [address] [place],  [place] [address]          Chief Complaint according to patient   Patient presents with:     New Patient (Initial Visit)           HISTORY OF PRESENT ILLNESS:  Peter Novak is a 73 y.o. male patient who is seen upon Dr Harding Li referral on 01/16/2024 for a sleep apnea evaluation:  Chief concern according to patient : " I am not convinced I have apnea :  My wife doesn't report me to snore or have apnea. But I am tired and have no energy. My blood tests returned normal". Years ago I had TIAs and started having balance problems, followed by Dr Jodelle Mungo , who referred me to Dr Tresia Fruit after an MRI showed up with white matter disease.  I have been less tolerant of exercise, limited endurance.  I have been less able to walk, have to take naps, and I get up 3-4 times each night to urinate since I had prostate cancer".  Chronic insomnia  " ADD" treated by Xanax  for 15 years or longer.    Sleep relevant medical history: Nocturia/3-4 , Sleep walking;none , Tonsillectomy at age 38, no TBI. Lyme disease. Covid infection.    Family medical /sleep history: no other family member dx with OSA.    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  The couple has  2 adult children, 19 and 86 years old.  Pets are present : dogs.  Tobacco use: none .  ETOH use : socially,  1 glass a week/  Caffeine intake in form of Coffee( 1 -2 cups in AM ) Soda( one a week ) Tea ( /) no energy drinks Exercise in form of walking .   Hobbies : likes to  spend time outdoors.       Sleep habits are as follows: The patient's dinner time is between 6-6.30 PM. The patient goes to bed at 10.30 PM and  is asleep in 10 minutes on  XANAX - continues to sleep for 7-9 hours, wakes for 3-4 bathroom breaks, the first time at 2-3 AM.   The preferred sleep position is laterally-, with the support of 1 pillow. Flat bed-   Bedroom is cool, quiet and dark.  Dreams are reportedly rare.   The patient wakes up spontaneously at 8 with an alarm. 8  AM is the usual rise time. E lets the dog out and goes back to sleep until 9.  He reports feeling refreshed or restored in AM, but soon is fatigued again-. Naps are taken more frequently, lasting from 30 to 60 minutes and are more refreshing , not interfering with nocturnal sleep.    Review of Systems: Out of a complete 14 system review, the patient complains of only the following symptoms, and all other reviewed systems are negative.:  Fatigue, sleepiness , Nocturia ," unsteady- not  light headed "   How likely are you to doze in the following situations: 0 = not likely, 1 = slight chance, 2 = moderate chance, 3 = high chance   Sitting and Reading? Watching Television? Sitting inactive in a  public place (theater or meeting)? As a passenger in a car for an hour without a break? Lying down in the afternoon when circumstances permit? Sitting and talking to someone? Sitting quietly after lunch without alcohol? In a car, while stopped for a few minutes in traffic?   Total = 7/ 24 points  FSS endorsed at 22/ 63 points.  GDS 5/ 15   Social History   Socioeconomic History   Marital status: Married    Spouse name: Not on file   Number of children: Not on file   Years of education: Not on file   Highest education level: Not on file  Occupational History   Masters in education, business teacher HS page   Tobacco Use   Smoking status: Never    Passive exposure: Never   Smokeless tobacco: Never  Vaping Use   Vaping status: Never Used  Substance and Sexual Activity   Alcohol use: Yes    Alcohol/week: 1.0 - 2.0 standard drink of alcohol    Types: 1 - 2 Standard drinks or  equivalent per week    Comment: social only    Drug use: No   Sexual activity: Not on file  Other Topics Concern   Not on file  Social History Narrative   Peter Novak 2021. Married 2006, divorced 2010. 2 children (35 and 84 in 2016 in good health), no grandkids.    [employer] grad.       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       Hobbies: place in [place] in mountains, outdoor activities, reading, follow things on PC      Right handed      Lives at home with wife and dog       Caffeine: 2 cups/day   Social Drivers of Corporate investment banker Strain: Not on file  Food Insecurity: Not on file  Transportation Needs: Not on file  Physical Activity: Not on file  Stress: Not on file  Social Connections: Not on file    Family History  Problem Relation Age of Onset   Diverticulosis Mother    Diabetes Father        AAA   Sudden death Brother 70       CAD; MI @ 71, smoker, sedentary, overweight, diabetes, HLD   Diabetes Brother    Hyperlipidemia Brother    Cancer Paternal Grandmother 14       unknown type   Heart attack Paternal Grandfather 29   Hashimoto's thyroiditis Daughter    Colon cancer Maternal Aunt        dx. 60s   Breast cancer Cousin 60   Stroke Neg Hx    Neuropathy Neg Hx    Multiple sclerosis Neg Hx    Migraines Neg Hx    Dementia Neg Hx    Colon polyps Neg Hx    Crohn's disease Neg Hx    Esophageal cancer Neg Hx    Rectal cancer Neg Hx    Stomach cancer Neg Hx    Ulcerative colitis Neg Hx     Past Medical History:  Diagnosis Date   ADD (attention deficit disorder with hyperactivity)    Allergy    seasonal   Arthritis    Cancer (HCC)    prostate cancer   Cataract    bilateral,removed   Complication of anesthesia    Constipation    GERD (gastroesophageal reflux disease)    Hearing loss  History of skin cancer    Melanoma X 2   Hyperlipidemia    Lyme disease 01/2021    Past  Surgical History:  Procedure Laterality Date   CATARACT EXTRACTION, BILATERAL  March 2022 and April 2022   CHOLECYSTECTOMY  2005   COLONOSCOPY  2008   negative   ELBOW SURGERY  1994   HERNIA REPAIR  2005   INNER EAR SURGERY  2006   Incus transposition;oscillculoplasty; Dr Raina Bunting EAR SURGERY  2011   Stirrup resection   LYMPHADENECTOMY Bilateral 11/04/2021   Procedure: LYMPHADENECTOMY, PELVIC;  Surgeon: Florencio Hunting, MD;  Location: [facility];  Service: Urology;  Laterality: Bilateral;   POLYPECTOMY     ROBOT ASSISTED LAPAROSCOPIC RADICAL PROSTATECTOMY N/A 11/04/2021   Procedure: XI ROBOTIC ASSISTED LAPAROSCOPIC RADICAL PROSTATECTOMY LEVEL 3;  Surgeon: Florencio Hunting, MD;  Location: [facility];  Service: Urology;  Laterality: N/A;   SHOULDER ARTHROSCOPY  2004   Dr Carry Clapper   TONSILLECTOMY AND ADENOIDECTOMY     VASECTOMY       Current Outpatient Medications on File Prior to Visit  Medication Sig Dispense Refill   acetaminophen  (TYLENOL ) 325 MG tablet Take 650 mg by mouth as needed for moderate pain.     ALPRAZolam  (XANAX ) 0.5 MG tablet Take 0.25 mg by mouth at bedtime.     aspirin  EC 81 MG tablet Take 1 tablet (81 mg total) by mouth daily. Swallow whole. 30 tablet 12   atorvastatin  (LIPITOR) 40 MG tablet Take 1 tablet (40 mg total) by mouth daily. 90 tablet 3   Cholecalciferol (VITAMIN D3 PO) Take by mouth daily.     clopidogrel  (PLAVIX ) 75 MG tablet Take 1 tablet (75 mg total) by mouth daily. 90 tablet 3   Cyanocobalamin  (VITAMIN B-12 SL) Place under the tongue daily.     dextroamphetamine (DEXTROSTAT) 5 MG tablet Take 5 mg by mouth as needed.     fluticasone  (FLONASE ) 50 MCG/ACT nasal spray Place 2 sprays into both nostrils daily.     hydrocortisone 2.5 % cream Apply 1 Application topically as needed.     Multiple Vitamin (MULTIVITAMIN ADULT PO) Take by mouth daily.     pantoprazole  (PROTONIX ) 40 MG tablet Take 1 tablet (40 mg total) by mouth daily. 30 tablet 5   polyethylene glycol  (MIRALAX  / GLYCOLAX ) 17 g packet Take 17 g by mouth as needed.     No current facility-administered medications on file prior to visit.    No Known Allergies   DIAGNOSTIC DATA (LABS, IMAGING, TESTING) - I reviewed patient records, labs, notes, testing and imaging myself where available.  Lab Results  Component Value Date   WBC 4.4 09/06/2023   HGB 13.8 09/06/2023   HCT 42.1 09/06/2023   MCV 90.6 09/06/2023   PLT 228.0 09/06/2023      Component Value Date/Time   NA 141 09/06/2023 0855   NA 140 04/16/2019 1137   K 4.2 09/06/2023 0855   CL 105 09/06/2023 0855   CO2 29 09/06/2023 0855   GLUCOSE 110 (H) 09/06/2023 0855   BUN 21 09/06/2023 0855   BUN 18 04/16/2019 1137   CREATININE 0.95 09/06/2023 0855   CREATININE 1.05 01/27/2023 1609   CALCIUM  9.6 09/06/2023 0855   PROT 6.9 09/06/2023 0855   ALBUMIN 4.7 09/06/2023 0855   AST 18 09/06/2023 0855   ALT 22 09/06/2023 0855   ALKPHOS 78 09/06/2023 0855   BILITOT 1.1 09/06/2023 0855   GFRNONAA >60 10/20/2021 1005  GFRNONAA 66 07/09/2020 1632   GFRAA 77 07/09/2020 1632   Lab Results  Component Value Date   CHOL 159 09/06/2023   HDL 48.50 09/06/2023   LDLCALC 83 09/06/2023   LDLDIRECT 73 07/09/2020   TRIG 138.0 09/06/2023   CHOLHDL 3 09/06/2023   Lab Results  Component Value Date   HGBA1C 6.2 09/06/2023   Lab Results  Component Value Date   VITAMINB12 283 09/06/2023   Lab Results  Component Value Date   TSH 2.29 09/06/2023    PHYSICAL EXAM:  Today's Vitals   01/16/24 1022  BP: 122/70  Pulse: (!) 55  Weight: 230 lb (104.3 kg)  Height: 6\' 3"  (1.905 m)   Body mass index is 28.75 kg/m.   Wt Readings from Last 3 Encounters:  01/16/24 230 lb (104.3 kg)  01/15/24 229 lb (103.9 kg)  12/05/23 226 lb 9.6 oz (102.8 kg)     Ht Readings from Last 3 Encounters:  01/16/24 6\' 3"  (1.905 m)  12/05/23 6\' 2"  (1.88 m)  12/05/23 6\' 2"  (1.88 m)      General: The patient is awake, alert and appears not in acute  distress. The patient is well groomed. His facial expression seems slightly masked, his voice is raspy.  Head: Normocephalic, atraumatic.  Neck is supple. Mallampati 3 , tongue and lips are trembling slightly, stooped posture.  neck circumference:17. 75  inches . Nasal airflow is patent.   Retrognathia is not seen.  Dental status: biological  Cardiovascular:  Regular rate and cardiac rhythm by pulse,  without distended neck veins. Respiratory: Lungs are clear to auscultation.  Skin:  Without evidence of ankle edema, or rash. Trunk: The patient's posture is slightly stooped.    NEUROLOGIC EXAM: The patient is awake and alert, oriented to place and time.   Memory subjective described as intact.  Attention span & concentration ability appears normal.  Speech is fluent,  with mild dysphonia . Mood and affect are appropriate.   Cranial nerves: reportedly  loss of smell or taste reported - over the last 3 years-  Pupils are equal and briskly reactive to light. Funduscopic exam deferred. Rare blinking. [name]  Extraocular movements in vertical and horizontal planes were intact and without nystagmus.  Visual fields by finger perimetry are intact. Hearing was intact to soft voice .    Facial sensation intact to fine touch.  Facial motor strength is symmetric, but masked-  and tongue and uvula move midline.  Neck ROM : rotation, tilt and flexion extension were restricted -   shoulder shrug was symmetrical.    Motor exam:  Symmetric bulk, tone and ROM.   Normal tone without cog wheeling, symmetric grip strength .   Sensory:  Fine touch, pinprick and vibration were tested  and  normal.  Proprioception tested in the upper extremities was normal.   Coordination: Rapid alternating movements in the fingers/hands were of normal speed.  The Finger-to-nose maneuver was intact without evidence of ataxia, dysmetria but there is a tremor.    Gait and station: Patient could rise unassisted from a seated  position, walked without assistive device.     ASSESSMENT AND PLAN 73 y.o. year old male  here with:    1) Fatigue -  increasing , latent fatigue - less exercise tolerance .   No clear history of apnea symptoms.  Daily naps now.   2) feeling slowed:  he is afraid of falling - feels off balance. (Has stooped posture, masked face and mild  dysphonia.)  loss of smell since Covid.    Plan : I like to invite Mr. Gail for a HST first, I am looking if apnea is present, if central apnea may be present. If CSA is present, will follow up with an In lab titration.   I plan to follow up prn if there is OSA/ CSA present (either personally or through our NP) within 4-5 months.   I would like to thank Almira Jaeger, MD and Glory Larsen, Md [address] [place],  [place] [address] for allowing me to meet with and to take care of this pleasant patient.    After spending a total time of  35  minutes face to face and additional time for physical and neurologic examination, review of laboratory studies,  personal review of imaging studies, reports and results of other testing and review of referral information / records as far as provided in visit,   Electronically signed by: Neomia Banner, MD 01/16/2024 10:42 AM  [facility] Board certified by The [employer] of Sleep Medicine and Diplomate of the [employer] of Sleep Medicine. Board certified In Neurology through the ABPN, Fellow of the [employer] of Neurology.

## 2024-01-16 NOTE — Patient Instructions (Signed)
 Fatigue If you have fatigue, you feel tired all the time and have a lack of energy or a lack of motivation. Fatigue may make it difficult to start or complete tasks because of exhaustion. Occasional or mild fatigue is often a normal response to activity or life. However, long-term (chronic) or extreme fatigue may be a symptom of a medical condition such as: Depression. Not having enough red blood cells or hemoglobin in the blood (anemia). A problem with a small gland located in the lower front part of the neck (thyroid disorder). Rheumatologic conditions. These are problems related to the body's defense system (immune system). Infections, especially certain viral infections. Fatigue can also lead to negative health outcomes over time. Follow these instructions at home: Medicines Take over-the-counter and prescription medicines only as told by your health care provider. Take a multivitamin if told by your health care provider. Do not use herbal or dietary supplements unless they are approved by your health care provider. Eating and drinking  Avoid heavy meals in the evening. Eat a well-balanced diet, which includes lean proteins, whole grains, plenty of fruits and vegetables, and low-fat dairy products. Avoid eating or drinking too many products with caffeine in them. Avoid alcohol. Drink enough fluid to keep your urine pale yellow. Activity  Exercise regularly, as told by your health care provider. Use or practice techniques to help you relax, such as yoga, tai chi, meditation, or massage therapy. Lifestyle Change situations that cause you stress. Try to keep your work and personal schedules in balance. Do not use recreational or illegal drugs. General instructions Monitor your fatigue for any changes. Go to bed and get up at the same time every day. Avoid fatigue by pacing yourself during the day and getting enough sleep at night. Maintain a healthy weight. Contact a health care  provider if: Your fatigue does not get better. You have a fever. You suddenly lose or gain weight. You have headaches. You have trouble falling asleep or sleeping through the night. You feel angry, guilty, anxious, or sad. You have swelling in your legs or another part of your body. Get help right away if: You feel confused, feel like you might faint, or faint. Your vision is blurry or you have a severe headache. You have severe pain in your abdomen, your back, or the area between your waist and hips (pelvis). You have chest pain, shortness of breath, or an irregular or fast heartbeat. You are unable to urinate, or you urinate less than normal. You have abnormal bleeding from the rectum, nose, lungs, nipples, or, if you are male, the vagina. You vomit blood. You have thoughts about hurting yourself or others. These symptoms may be an emergency. Get help right away. Call 911. Do not wait to see if the symptoms will go away. Do not drive yourself to the hospital. Get help right away if you feel like you may hurt yourself or others, or have thoughts about taking your own life. Go to your nearest emergency room or: Call 911. Call the National Suicide Prevention Lifeline at (262)721-8699 or 988. This is open 24 hours a day. Text the Crisis Text Line at 8450584327. Summary If you have fatigue, you feel tired all the time and have a lack of energy or a lack of motivation. Fatigue may make it difficult to start or complete tasks because of exhaustion. Long-term (chronic) or extreme fatigue may be a symptom of a medical condition. Exercise regularly, as told by your health care provider.  Change situations that cause you stress. Try to keep your work and personal schedules in balance. This information is not intended to replace advice given to you by your health care provider. Make sure you discuss any questions you have with your health care provider. Document Revised: 06/07/2021 Document  Reviewed: 06/07/2021 Elsevier Patient Education  2024 [employer].

## 2024-01-23 ENCOUNTER — Other Ambulatory Visit (INDEPENDENT_AMBULATORY_CARE_PROVIDER_SITE_OTHER)

## 2024-01-23 ENCOUNTER — Ambulatory Visit: Payer: Self-pay | Admitting: Family Medicine

## 2024-01-23 DIAGNOSIS — R5383 Other fatigue: Secondary | ICD-10-CM

## 2024-01-23 LAB — CBC WITH DIFFERENTIAL/PLATELET
Basophils Absolute: 0 10*3/uL (ref 0.0–0.1)
Basophils Relative: 0.9 % (ref 0.0–3.0)
Eosinophils Absolute: 0.1 10*3/uL (ref 0.0–0.7)
Eosinophils Relative: 3 % (ref 0.0–5.0)
HCT: 40.3 % (ref 39.0–52.0)
Hemoglobin: 13.5 g/dL (ref 13.0–17.0)
Lymphocytes Relative: 27.4 % (ref 12.0–46.0)
Lymphs Abs: 1.2 10*3/uL (ref 0.7–4.0)
MCHC: 33.6 g/dL (ref 30.0–36.0)
MCV: 88.3 fl (ref 78.0–100.0)
Monocytes Absolute: 0.4 10*3/uL (ref 0.1–1.0)
Monocytes Relative: 9.7 % (ref 3.0–12.0)
Neutro Abs: 2.5 10*3/uL (ref 1.4–7.7)
Neutrophils Relative %: 59 % (ref 43.0–77.0)
Platelets: 204 10*3/uL (ref 150.0–400.0)
RBC: 4.56 Mil/uL (ref 4.22–5.81)
RDW: 13.8 % (ref 11.5–15.5)
WBC: 4.3 10*3/uL (ref 4.0–10.5)

## 2024-01-23 LAB — COMPREHENSIVE METABOLIC PANEL WITH GFR
ALT: 21 U/L (ref 0–53)
AST: 18 U/L (ref 0–37)
Albumin: 4.6 g/dL (ref 3.5–5.2)
Alkaline Phosphatase: 64 U/L (ref 39–117)
BUN: 25 mg/dL — ABNORMAL HIGH (ref 6–23)
CO2: 24 meq/L (ref 19–32)
Calcium: 9.7 mg/dL (ref 8.4–10.5)
Chloride: 105 meq/L (ref 96–112)
Creatinine, Ser: 0.97 mg/dL (ref 0.40–1.50)
GFR: 76.98 mL/min (ref >60.00)
Glucose, Bld: 112 mg/dL — ABNORMAL HIGH (ref 70–99)
Potassium: 3.9 meq/L (ref 3.5–5.1)
Sodium: 138 meq/L (ref 135–145)
Total Bilirubin: 1.4 mg/dL — ABNORMAL HIGH (ref 0.2–1.2)
Total Protein: 7.2 g/dL (ref 6.0–8.3)

## 2024-01-23 LAB — C-REACTIVE PROTEIN: CRP: <1 mg/dL (ref 0.5–20.0)

## 2024-01-23 LAB — CORTISOL: Cortisol, Plasma: 11.4 ug/dL

## 2024-01-30 ENCOUNTER — Ambulatory Visit (INDEPENDENT_AMBULATORY_CARE_PROVIDER_SITE_OTHER): Admitting: Neurology

## 2024-01-30 DIAGNOSIS — G4710 Hypersomnia, unspecified: Secondary | ICD-10-CM | POA: Diagnosis not present

## 2024-01-30 DIAGNOSIS — R5383 Other fatigue: Secondary | ICD-10-CM

## 2024-01-30 DIAGNOSIS — G4719 Other hypersomnia: Secondary | ICD-10-CM

## 2024-01-31 DIAGNOSIS — F4320 Adjustment disorder, unspecified: Secondary | ICD-10-CM | POA: Diagnosis not present

## 2024-01-31 DIAGNOSIS — C61 Malignant neoplasm of prostate: Secondary | ICD-10-CM | POA: Diagnosis not present

## 2024-01-31 DIAGNOSIS — R351 Nocturia: Secondary | ICD-10-CM | POA: Diagnosis not present

## 2024-01-31 NOTE — Progress Notes (Signed)
 [facility]  Peter Novak 73 year old male 05/23/1951   HOME SLEEP TEST REPORT ( by Watch PAT)   STUDY DATE:  01-30-2024      ORDERING CLINICIAN: Neomia Banner, MD  REFERRING CLINICIAN: Aldona Amel < MD    CLINICAL INFORMATION/HISTORY: 01-16-2024 sleep consult referral : referral for fatigue,  Excessive daytime sleepiness, followed by Headache specialist for ocular migraine, rule out apnea. MRI 12-13-23 reviewed in visit.   Found to be slightly stooped and with mild dysphonia. Infrequently blinking. Anosmia. Feeling slower than he used to be, as if his mind is faster than his body. Some concern of falling. Peter Novak      Epworth sleepiness score: 7/ 24 points  FSS endorsed at 22/ 63 points.  GDS 5/ 15    BMI:  28. 75 kg/m   Neck Circumference: 17.75    FINDINGS:   Sleep Summary:   Total Recording Time (hours, min):   8 h 32      Total Sleep Time (hours, min):     7 h 47            Percent REM (%):   14.8%                                      Respiratory Indices:   Calculated pAHI (per CMS guideline): 0.4/h                          REM pAHI:           1.8/h                                       NREM pAHI:        0.2/h                      Positional AHI:  The majority of sleep in right lateral position had a similar level apnea hypopnea index and the smaller portion in supine sleep position.  Snoring:    Minimal                                            Oxygen Saturation Statistics:   Oxygen Saturation (%) Mean: 95%              O2 Saturation Range (%): Up to a maximum of 98% saturation                                     O2 Saturation (minutes) <89%: 0 minutes         Pulse Rate Statistics:   Pulse Mean (bpm):     61 bpm            Pulse Range:   Between 43 and 90 bpm              IMPRESSION:  This HST did not find sleep apnea to be present.  There were very few apneic events overall . Allowed is an apnea hypopnea index of  5/h or less to be considered physiologically normal.  [name] AHI is 0.4/h.  There is no associated hypoxia, and his sleep is not  fragmented .  RECOMMENDATION: No sleep related follow-up is necessary.  Any sleep-related headaches also would be unexplained as they are usually associated with hypoxia or with sinus congestion.  I will share the results with Dr. Clarisa Crooked and Dr. Dennard Fisher immediately   INTERPRETING PHYSICIAN:   02-07-2024, at 21:33 hours.    Neomia Banner, MD  [facility] Board certified by The [employer] of Sleep Medicine and Diplomate of the [employer] of Sleep Medicine. Board certified In Neurology through the ABPN, Fellow of the [employer] of Neurology.

## 2024-02-01 ENCOUNTER — Encounter: Payer: Self-pay | Admitting: Family Medicine

## 2024-02-01 DIAGNOSIS — R2689 Other abnormalities of gait and mobility: Secondary | ICD-10-CM

## 2024-02-01 DIAGNOSIS — R531 Weakness: Secondary | ICD-10-CM

## 2024-02-02 ENCOUNTER — Other Ambulatory Visit

## 2024-02-02 DIAGNOSIS — R2689 Other abnormalities of gait and mobility: Secondary | ICD-10-CM

## 2024-02-02 DIAGNOSIS — R531 Weakness: Secondary | ICD-10-CM

## 2024-02-02 DIAGNOSIS — W57XXXA Bitten or stung by nonvenomous insect and other nonvenomous arthropods, initial encounter: Secondary | ICD-10-CM | POA: Diagnosis not present

## 2024-02-02 DIAGNOSIS — S30861A Insect bite (nonvenomous) of abdominal wall, initial encounter: Secondary | ICD-10-CM | POA: Diagnosis not present

## 2024-02-05 ENCOUNTER — Ambulatory Visit: Payer: Self-pay | Admitting: Family Medicine

## 2024-02-05 LAB — LYME DISEASE SEROLOGY W/REFLEX: Lyme Total Antibody EIA: NEGATIVE

## 2024-02-07 ENCOUNTER — Encounter: Payer: Self-pay | Admitting: Family Medicine

## 2024-02-07 ENCOUNTER — Telehealth: Payer: Self-pay | Admitting: Neurology

## 2024-02-07 ENCOUNTER — Encounter: Payer: Self-pay | Admitting: Neurology

## 2024-02-07 ENCOUNTER — Other Ambulatory Visit: Payer: Self-pay

## 2024-02-07 DIAGNOSIS — R2689 Other abnormalities of gait and mobility: Secondary | ICD-10-CM

## 2024-02-07 NOTE — Telephone Encounter (Signed)
 Pt reports the sleep study was returned last Wednesday, he would like to be called as soon as results are available.

## 2024-02-07 NOTE — Telephone Encounter (Signed)
 Pt call to schedule appt  Appt Scheduled

## 2024-02-07 NOTE — Procedures (Signed)
 [facility]  Peter Novak 73 year old male Jun 19, 1951   HOME SLEEP TEST REPORT ( by Watch PAT)   STUDY DATE:  01-30-2024      ORDERING CLINICIAN: Neomia Banner, MD  REFERRING CLINICIAN: Aldona Amel < MD    CLINICAL INFORMATION/HISTORY: 01-16-2024 sleep consult referral : referral for fatigue,  Excessive daytime sleepiness, followed by Headache specialist for ocular migraine, rule out apnea. MRI 12-13-23 reviewed in visit.   Found to be slightly stooped and with mild dysphonia. Infrequently blinking. Anosmia. Feeling slower than he used to be, as if his mind is faster than his body. Some concern of falling. [name]      Epworth sleepiness score: 7/ 24 points  FSS endorsed at 22/ 63 points.  GDS 5/ 15    BMI:  28. 75 kg/m   Neck Circumference: 17.75    FINDINGS:   Sleep Summary:   Total Recording Time (hours, min):   8 h 32      Total Sleep Time (hours, min):     7 h 47            Percent REM (%):   14.8%                                      Respiratory Indices:   Calculated pAHI (per CMS guideline): 0.4/h                          REM pAHI:           1.8/h                                       NREM pAHI:        0.2/h                      Positional AHI:  The majority of sleep in right lateral position had a similar level apnea hypopnea index and the smaller portion in supine sleep position.  Snoring:    Minimal                                            Oxygen Saturation Statistics:   Oxygen Saturation (%) Mean: 95%              O2 Saturation Range (%): Up to a maximum of 98% saturation                                     O2 Saturation (minutes) <89%: 0 minutes         Pulse Rate Statistics:   Pulse Mean (bpm):     61 bpm            Pulse Range:   Between 43 and 90 bpm              IMPRESSION:  This HST did not find sleep apnea to be present.  There were very few apneic events overall . Allowed is an apnea hypopnea index of  5/h or less to be considered physiologically normal.  Mr. [name] AHI is 0.4/h.  There is no associated hypoxia, and his sleep is not  fragmented .  RECOMMENDATION: No sleep related follow-up is necessary.  Any sleep-related headaches also would be unexplained as they are usually associated with hypoxia or with sinus congestion.  I will share the results with Dr. Clarisa Crooked and Dr. Dennard Fisher immediately   INTERPRETING PHYSICIAN:   02-07-2024, at 21:33 hours.    Neomia Banner, MD  [facility] Board certified by The [employer] of Sleep Medicine and Diplomate of the [employer] of Sleep Medicine. Board certified In Neurology through the ABPN, Fellow of the [employer] of Neurology.

## 2024-02-13 ENCOUNTER — Encounter (INDEPENDENT_AMBULATORY_CARE_PROVIDER_SITE_OTHER): Payer: Self-pay | Admitting: Otolaryngology

## 2024-02-13 ENCOUNTER — Ambulatory Visit (INDEPENDENT_AMBULATORY_CARE_PROVIDER_SITE_OTHER): Admitting: Otolaryngology

## 2024-02-13 VITALS — BP 102/68 | HR 55

## 2024-02-13 DIAGNOSIS — R2689 Other abnormalities of gait and mobility: Secondary | ICD-10-CM

## 2024-02-13 DIAGNOSIS — H906 Mixed conductive and sensorineural hearing loss, bilateral: Secondary | ICD-10-CM | POA: Diagnosis not present

## 2024-02-13 NOTE — Progress Notes (Signed)
 Dear Dr. Arlene Ben, Here is my assessment for our mutual patient, Peter Novak. Thank you for allowing me the opportunity to care for your patient. Please do not hesitate to contact me should you have any other questions. Sincerely, Dr. Milon Aloe  Otolaryngology Clinic Note Referring provider: Dr. Arlene Ben HPI:  Peter Novak is a 73 y.o. male kindly referred by Dr. Arlene Ben for evaluation of imbalance and hearing complaints.  Initial visit (01/2024): Prior patient of Dr. Adelia Adolphus for voice complaints but long-standing ear history with stapes fixation s/p Ossiculoplasty with Dr. Jodelle Mungo in 2011. Last seen in 2022 at which point his hearing was stable. Today, he reports intermittent right ear fullness for which flonase /zyrtec help. Mild right ear hearing decline. Main complaint is chronic imbalance - worsening. Denies frank vertigo, episodic tinnitus/hearing change or Meniere's type sx, headaches, or triggers such as head movement. Primarily, he reports imbalance requiring a shuffling gait/gait change. No tremors, but voice also seems slower. There is concern for Parkinson's. No falls recently. Scheduled for cardiac and neuro workup. Patient currently denies: ear pain, fullness, frank vertigo, drainage Patient also denies barotrauma, vestibular suppressant use, ototoxic medication use Prior ear surgery: K-helix malleus to neomembrane. .   H&N Surgery: see above Personal or FHx of bleeding dz or anesthesia difficulty: no   AP/AC: ASA, Plavix   Tobacco: no  PMHx: HTN, TIA, h/o Lyme Diasease  Independent Review of Additional Tests or Records:  Dr. Tresia Fruit (Neuro) 12/05/2023: noted TIA, meniere's disease, HTN, HLD, Diabetes, Ocular Migraines; undergoing workup for stroke/TIA and management Dr. Arlene Ben (11/15/2023): noted balance issues (shuffling with shortened stride), chronic fatigue. Walking with slow gait, deliberate; feels off balance.  Dr. Larkin Plumb notes: decreased hearing, progressive over  5-7 years. Voice changes also noted.CT Neck was also done. Dx: HL; Rx: audiogram, CT Temporal bones Dr. Jodelle Mungo (07/08/2021): K-helix right; noted procem cement, good position of prosthesis. Weber right. Hearing change attributed to intermittent ETD. Consider amplification.   Audio 07/08/2021:    Audiogram 05/23/2023: noted b/l mixed hearing loss with A/A tymps. Primarily in lower frequencies with ABG ~20dB at 250Hz  AD then 20dB at higher frequencies up to ~2K Hz  Lyme disease, CBC and CMP (02/02/2024 and 01/23/2024): lyme Negative, WBC 4.3, BUN/Cr wnl CT Temporal bones 08/07/2023 independently interpreted: mastoids and ME well aerated b/l; noted malleus to neomembrane prosthesis on right, seems appropriately positioned; mild fistula antefenestrum lucency left and right, c/f otosclerosis with OC intact AS PMH/Meds/All/SocHx/FamHx/ROS:   Past Medical History:  Diagnosis Date   ADD (attention deficit disorder with hyperactivity)    Allergy    seasonal   Arthritis    Cancer (HCC)    prostate cancer   Cataract    bilateral,removed   Complication of anesthesia    Constipation    GERD (gastroesophageal reflux disease)    Hearing loss    History of skin cancer    Melanoma X 2   Hyperlipidemia    Lyme disease 01/2021     Past Surgical History:  Procedure Laterality Date   CATARACT EXTRACTION, BILATERAL  March 2022 and April 2022   CHOLECYSTECTOMY  2005   COLONOSCOPY  2008   negative   ELBOW SURGERY  1994   HERNIA REPAIR  2005   INNER EAR SURGERY  2006   Incus transposition;oscillculoplasty; Dr Raina Bunting EAR SURGERY  2011   Stirrup resection   LYMPHADENECTOMY Bilateral 11/04/2021   Procedure: LYMPHADENECTOMY, PELVIC;  Surgeon: Florencio Hunting, MD;  Location: [facility];  Service: Urology;  Laterality: Bilateral;   POLYPECTOMY     ROBOT ASSISTED LAPAROSCOPIC RADICAL PROSTATECTOMY N/A 11/04/2021   Procedure: XI ROBOTIC ASSISTED LAPAROSCOPIC RADICAL PROSTATECTOMY LEVEL 3;  Surgeon:  Florencio Hunting, MD;  Location: [facility];  Service: Urology;  Laterality: N/A;   SHOULDER ARTHROSCOPY  2004   Dr Carry Clapper   TONSILLECTOMY AND ADENOIDECTOMY     VASECTOMY      Family History  Problem Relation Age of Onset   Diverticulosis Mother    Diabetes Father        AAA   Sudden death Brother 52       CAD; MI @ 43, smoker, sedentary, overweight, diabetes, HLD   Diabetes Brother    Hyperlipidemia Brother    Cancer Paternal Grandmother 30       unknown type   Heart attack Paternal Grandfather 59   Hashimoto's thyroiditis Daughter    Colon cancer Maternal Aunt        dx. 60s   Breast cancer Cousin 60   Stroke Neg Hx    Neuropathy Neg Hx    Multiple sclerosis Neg Hx    Migraines Neg Hx    Dementia Neg Hx    Colon polyps Neg Hx    Crohn's disease Neg Hx    Esophageal cancer Neg Hx    Rectal cancer Neg Hx    Stomach cancer Neg Hx    Ulcerative colitis Neg Hx      Social Connections: Not on file      Current Outpatient Medications:    acetaminophen  (TYLENOL ) 325 MG tablet, Take 650 mg by mouth as needed for moderate pain., Disp: , Rfl:    ALPRAZolam  (XANAX ) 0.5 MG tablet, Take 0.25 mg by mouth at bedtime., Disp: , Rfl:    aspirin  EC 81 MG tablet, Take 1 tablet (81 mg total) by mouth daily. Swallow whole., Disp: 30 tablet, Rfl: 12   atorvastatin  (LIPITOR) 40 MG tablet, Take 1 tablet (40 mg total) by mouth daily., Disp: 90 tablet, Rfl: 3   Cholecalciferol (VITAMIN D3 PO), Take by mouth daily., Disp: , Rfl:    clopidogrel  (PLAVIX ) 75 MG tablet, Take 1 tablet (75 mg total) by mouth daily., Disp: 90 tablet, Rfl: 3   Cyanocobalamin  (VITAMIN B-12 SL), Place under the tongue daily., Disp: , Rfl:    dextroamphetamine (DEXTROSTAT) 5 MG tablet, Take 5 mg by mouth as needed., Disp: , Rfl:    fluticasone  (FLONASE ) 50 MCG/ACT nasal spray, Place 2 sprays into both nostrils daily., Disp: , Rfl:    hydrocortisone 2.5 % cream, Apply 1 Application topically as needed., Disp: , Rfl:    Multiple  Vitamin (MULTIVITAMIN ADULT PO), Take by mouth daily., Disp: , Rfl:    pantoprazole  (PROTONIX ) 40 MG tablet, Take 1 tablet (40 mg total) by mouth daily., Disp: 30 tablet, Rfl: 5   polyethylene glycol (MIRALAX  / GLYCOLAX ) 17 g packet, Take 17 g by mouth as needed., Disp: , Rfl:    Physical Exam:   BP 102/68   Pulse (!) 55   SpO2 97%   Salient findings:  CN II-XII intact - no tongue fasciculation Given history and complaints, ear microscopy was indicated and performed for evaluation with findings as below in physical exam section and in procedures;  Bilateral EAC clear and TM intact with well pneumatized middle ear spaces - AD procem in place posteriorly with K-helix in place with visualized coil Weber 512: right Rinne 512: AC > BC b/l; Rinne 256 AC>BC b/l Anterior rhinoscopy:  Septum intact; bilateral inferior turbinates without significant hypertrophy No lesions of oral cavity/oropharynx No obviously palpable neck masses/lymphadenopathy/thyromegaly No respiratory distress or stridor Head shake neg, gait not broad based but shuffling  Seprately Identifiable Procedures:  Prior to initiating any procedures, risks/benefits/alternatives were explained to the patient and verbal consent obtained. Procedure: Bilateral ear microscopy using microscope (CPT [id number]) Pre-procedure diagnosis: bilateral hearing loss Post-procedure diagnosis: same Indication: see above; given patient's otologic complaints and history, for improved and comprehensive examination of external ear and tympanic membrane, bilateral otologic examination using microscope was performed. Prior to proceeding, verbal consent was obtained after discussion of R/B/A  Procedure: Patient was placed semi-recumbent. Both ear canals were examined using the microscope with findings above. Patient tolerated the procedure well.   Impression & Plans:  Peter Novak is a 73 y.o. male with:  1. Imbalance   2. Mixed hearing loss, bilateral     Noted long-standing ear history with CT showing right K-helix in appropriate position. Hearing essentially seems stable and given small ABG on right, do not think additional ME surgery would give him significant enough benefit especially in light of his current medical workup. Would recommend HA which he will consider In addition, from imbalance standpoint, we discussed options including formal vestibular testing but he declined at this point do think neurologic cause is more likely as he does not have pattern of symptoms suggestive of primary otologic cause including vertigo.  - Agree with further neuro workup for etiology of his imbalance.  - f/u 1 year with audio  See below regarding exact medications prescribed this encounter including dosages and route: No orders of the defined types were placed in this encounter.     Thank you for allowing me the opportunity to care for your patient. Please do not hesitate to contact me should you have any other questions.  Sincerely, Milon Aloe, MD Otolaryngologist (ENT), [facility] Phone: [phone] Fax: [phone]  02/13/2024, 8:37 AM   I have personally spent 45 minutes involved in face-to-face and non-face-to-face activities for this patient on the day of the visit.  Professional time spent excludes any procedures performed but includes the following activities, in addition to those noted in the documentation: preparing to see the patient (review of outside documentation and results), performing a medically appropriate examination, extensive counseling, documenting in the electronic health record, independently interpreting results (CT, audio).

## 2024-02-16 NOTE — Progress Notes (Unsigned)
 Assessment/Plan:    ***Patient has had multiple acute episodes of expressive aphasia and trouble writing with negative stroke workup  - Given that he has had repetitive episodes over the years (actually over many years, nearly a decade) of inability to find words and trouble speaking followed by vision change, one has to wonder whether or not these are not complicated migraine.  I think that seizure also needs to be on the differential diagnosis.  3.  Pt has appt with Dr. Tresia Fruit in 06/2024.   Subjective:   Peter Novak was seen today in the movement disorders clinic for neurologic consultation at the request of Almira Jaeger, MD.  The consultation is for the evaluation of ***.  Prior medical records made available to me are reviewed.  Patient is a patient of [facility].  Patient has been seen Dr. Tresia Fruit since 2017.  She had been following him for history of TIA versus complicated migraine as well as advanced white matter changes.  When Dr. Tresia Fruit last saw him in April, 2025, the patient was relating to her that he had another episode on November 28, 2023 where he had trouble getting his words out for 10 to 15 minutes.  She again proceeded with another stroke workup.  Patient was referred to Dr. Albertina Hugger for sleep study.  That was negative.  She also noted on examination features of parkinsonism.   Specific Symptoms:  Tremor: {yes no:314532} Family hx of similar:  {yes no:314532} Voice: *** Sleep: ***  Vivid Dreams:  {yes no:314532}  Acting out dreams:  {yes no:314532} Wet Pillows: {yes no:314532} Postural symptoms:  {yes no:314532}  Falls?  {yes no:314532} Bradykinesia symptoms: {parkinson brady:18041} Loss of smell:  {yes no:314532} Loss of taste:  {yes no:314532} Urinary Incontinence:  {yes no:314532} Difficulty Swallowing:  {yes no:314532} Handwriting, micrographia: {yes no:314532} Trouble with ADL's:  {yes no:314532}  Trouble buttoning clothing: {yes  no:314532} Depression:  {yes no:314532} Memory changes:  {yes no:314532} Hallucinations:  {yes no:314532}  visual distortions: {yes no:314532} N/V:  {yes no:314532} Lightheaded:  {yes no:314532}  Syncope: {yes no:314532} Diplopia:  {yes no:314532} Dyskinesia:  {yes no:314532} Prior exposure to reglan/antipsychotics: {yes no:314532}  Neuroimaging of the brain has *** previously been performed.  It *** available for my review today.  PREVIOUS MEDICATIONS: {Parkinson's RX:18200}  ALLERGIES:  No Known Allergies  CURRENT MEDICATIONS:  No outpatient medications have been marked as taking for the 02/20/24 encounter (Appointment) with Latria Mccarron, [place], DO.     Objective:   VITALS:  There were no vitals filed for this visit.  GEN:  The patient appears stated age and is in NAD. HEENT:  Normocephalic, atraumatic.  The mucous membranes are moist. The superficial temporal arteries are without ropiness or tenderness. CV:  RRR Lungs:  CTAB Neck/HEME:  There are no carotid bruits bilaterally.  Neurological examination:  Orientation: The patient is alert and oriented x3.  Cranial nerves: There is good facial symmetry. Extraocular muscles are intact. The visual fields are full to confrontational testing. The speech is fluent and clear. Soft palate rises symmetrically and there is no tongue deviation. Hearing is intact to conversational tone. Sensation: Sensation is intact to light and pinprick throughout (facial, trunk, extremities). Vibration is intact at the bilateral big toe. There is no extinction with double simultaneous stimulation. There is no sensory dermatomal level identified. Motor: Strength is 5/5 in the bilateral upper and lower extremities.   Shoulder shrug is equal and symmetric.  There is no pronator  drift. Deep tendon reflexes: Deep tendon reflexes are 2/4 at the bilateral biceps, triceps, brachioradialis, patella and achilles. Plantar responses are downgoing  bilaterally.  Movement examination: Tone: There is ***tone in the bilateral upper extremities.  The tone in the lower extremities is ***.  Abnormal movements: *** Coordination:  There is *** decremation with RAM's, *** Gait and Station: The patient has *** difficulty arising out of a deep-seated chair without the use of the hands. The patient's stride length is ***.  The patient has a *** pull test.     I have reviewed and interpreted the following labs independently   Chemistry      Component Value Date/Time   NA 138 01/23/2024 0942   NA 140 04/16/2019 1137   K 3.9 01/23/2024 0942   CL 105 01/23/2024 0942   CO2 24 01/23/2024 0942   BUN 25 (H) 01/23/2024 0942   BUN 18 04/16/2019 1137   CREATININE 0.97 01/23/2024 0942   CREATININE 1.05 01/27/2023 1609      Component Value Date/Time   CALCIUM  9.7 01/23/2024 0942   ALKPHOS 64 01/23/2024 0942   AST 18 01/23/2024 0942   ALT 21 01/23/2024 0942   BILITOT 1.4 (H) 01/23/2024 0942      Lab Results  Component Value Date   TSH 2.29 09/06/2023   Lab Results  Component Value Date   WBC 4.3 01/23/2024   HGB 13.5 01/23/2024   HCT 40.3 01/23/2024   MCV 88.3 01/23/2024   PLT 204.0 01/23/2024     Total time spent on today's visit was ***60 minutes, including both face-to-face time and nonface-to-face time.  Time included that spent on review of records (prior notes available to me/labs/imaging if pertinent), discussing treatment and goals, answering patient's questions and coordinating care.  Cc:  Almira Jaeger, MD

## 2024-02-19 ENCOUNTER — Ambulatory Visit: Attending: Internal Medicine | Admitting: Internal Medicine

## 2024-02-19 ENCOUNTER — Ambulatory Visit

## 2024-02-19 ENCOUNTER — Other Ambulatory Visit: Payer: Self-pay | Admitting: Internal Medicine

## 2024-02-19 ENCOUNTER — Encounter: Payer: Self-pay | Admitting: Internal Medicine

## 2024-02-19 VITALS — BP 120/64 | HR 58 | Ht 75.0 in | Wt 222.0 lb

## 2024-02-19 DIAGNOSIS — G459 Transient cerebral ischemic attack, unspecified: Secondary | ICD-10-CM

## 2024-02-19 DIAGNOSIS — N182 Chronic kidney disease, stage 2 (mild): Secondary | ICD-10-CM

## 2024-02-19 DIAGNOSIS — I700 Atherosclerosis of aorta: Secondary | ICD-10-CM | POA: Diagnosis not present

## 2024-02-19 DIAGNOSIS — R0609 Other forms of dyspnea: Secondary | ICD-10-CM

## 2024-02-19 DIAGNOSIS — E785 Hyperlipidemia, unspecified: Secondary | ICD-10-CM

## 2024-02-19 NOTE — Progress Notes (Signed)
 Cardiology Office Note:   Date:  02/19/2024  ID:  Peter Novak, DOB 1951-05-30, MRN [id number] PCP:  Katrinka Garnette KIDD, MD  [facility] Providers Cardiologist:  Wendel Haws, MD Referring MD: Katrinka Garnette KIDD, MD  Chief Complaint/Reason for Referral: Dyspnea ASSESSMENT:    1. Dyspnea on exertion   2. TIA (transient ischemic attack)   3. Hyperlipidemia LDL goal <55   4. Aortic atherosclerosis (HCC)   5. CKD (chronic kidney disease) stage 2, GFR 60-89 ml/min     PLAN:   In order of problems listed above: Dyspnea: We will obtain a coronary CTA and echocardiogram to evaluate further.  If the patient has mild obstructive coronary artery disease, they will require a statin (with goal LDL < 70) and aspirin , if they have high-grade disease we will need to consider optimal medical therapy and if symptoms are refractory to medical therapy, then a cardiac catheterization with possible PCI will be pursued to alleviate symptoms.  If they have high risk disease we will proceed directly to cardiac catheterization.  Will obtain monitor.  Because of overall peripheral weakness we will have the patient stop atorvastatin  for 5 days to see if this helps his symptoms.  If it does not I have asked him to restart the medication.  If it does I have asked him to let us  know. TIA: Had a recent episode which neurology thinks is more consistent with a TIA.  Will refer for a monitor to evaluate further.  Bubble study echocardiogram was negative for intracardiac shunt in 2023. Hyperlipidemia: Given possible TIA goal LDL is less than 55; check lipid panel and LFTs today.  His LDL on atorvastatin  20 mg in January was 83. Aortic atherosclerosis: Continue aspirin  81 mg and atorvastatin  20 mg.  CKD stage II: Monitor; consider ARB in the future.           Dispo:  Return if symptoms worsen or fail to improve.      Medication Adjustments/Labs and Tests Ordered: Current medicines are reviewed at length with the  patient today.  Concerns regarding medicines are outlined above.  The following changes have been made:  no change   Labs/tests ordered: Orders Placed This Encounter  Procedures   CT CORONARY MORPH W/CTA COR W/SCORE W/CA W/CM &/OR WO/CM   Lipid Profile   Hepatic function panel   ECHOCARDIOGRAM COMPLETE    Medication Changes: No orders of the defined types were placed in this encounter.   Current medicines are reviewed at length with the patient today.  The patient does not have concerns regarding medicines.     History of Present Illness:    FOCUSED PROBLEM LIST:   Aphasia plus headache 2018 Neurologic workup negative Head MR small vessel disease 2018 CTA head and neck no LVO Bubble study TTE negative 2023 No atrial fibrillation, monitor 2023 Presentation thought to be C/W migraine with aura/ocular migraine Suspected TIA April 2025 Placed on DAPT Head MRI small vessel disease CTA head and neck no LVO Hyperlipidemia Aortic atherosclerosis PET scan 2022 CKD stage II BMI 25 February 2024:  Patient consents to use of AI scribe. The patient is a 73 year old male with the above listed medical problems referred by his PCP for ongoing fatigue.  The patient had presented to [facility] 2018 with aphasia and a headache.  His workup was negative.  Thought to be due to migraine with aura.  He not been having a bubble study echocardiogram done in 2023 which showed  no evidence of intracardiac shunt.  A monitor was placed which showed no atrial fibrillation.  He saw neurology in April with aphasia and dizziness.  This was concerning for a TIA and he was started on DAPT.  MR and CT imaging was unremarkable other than for small vessel disease.  Neurology was also considering evaluation for Parkinson's.  He has been experiencing significant fatigue and shortness of breath since March 2025. Previously active, walking 8-12 miles a week, he now finds himself winded and needing to rest  after breakfast. These symptoms primarily occur upon exertion, sometimes improving in the morning but worsening by mid-afternoon. No chest discomfort, swelling in the legs, or problems breathing while lying flat. Occasional lightheadedness, particularly when getting out of the shower, is noted.  He is on atorvastatin  20 mg for cholesterol management, which was increased in January 2025. He questions whether this medication could be contributing to his symptoms, as he has experienced muscle cramps in the past. His LDL cholesterol was 83 mg/dL in January.  His social history includes [place] where he previously contracted Lyme disease, which has since been retested and ruled out as a current issue. He also has a prosthesis in his ear, which he feels contributes to his unsteadiness, requiring him to hold onto surfaces for stability.     Current Medications: Current Meds  Medication Sig   acetaminophen  (TYLENOL ) 325 MG tablet Take 650 mg by mouth as needed for moderate pain.   ALPRAZolam  (XANAX ) 0.5 MG tablet Take 0.25 mg by mouth at bedtime.   aspirin  EC 81 MG tablet Take 1 tablet (81 mg total) by mouth daily. Swallow whole.   atorvastatin  (LIPITOR) 40 MG tablet Take 1 tablet (40 mg total) by mouth daily.   Cholecalciferol (VITAMIN D3 PO) Take by mouth daily.   clopidogrel  (PLAVIX ) 75 MG tablet Take 1 tablet (75 mg total) by mouth daily.   Cyanocobalamin  (VITAMIN B-12 SL) Place under the tongue daily.   dextroamphetamine (DEXTROSTAT) 5 MG tablet Take 5 mg by mouth as needed.   fluticasone  (FLONASE ) 50 MCG/ACT nasal spray Place 2 sprays into both nostrils daily.   hydrocortisone 2.5 % cream Apply 1 Application topically as needed.   Multiple Vitamin (MULTIVITAMIN ADULT PO) Take by mouth daily.   pantoprazole  (PROTONIX ) 40 MG tablet Take 1 tablet (40 mg total) by mouth daily.   polyethylene glycol (MIRALAX  / GLYCOLAX ) 17 g packet Take 17 g by mouth as needed.     Review of  Systems:   Please see the history of present illness.    All other systems reviewed and are negative.     EKGs/Labs/Other Test Reviewed:   EKG: March 2025 sinus bradycardia  EKG Interpretation Date/Time:    Ventricular Rate:    PR Interval:    QRS Duration:    QT Interval:    QTC Calculation:   R Axis:      Text Interpretation:           Risk Assessment/Calculations:          Physical Exam:   VS:  BP 120/64   Pulse (!) 58   Ht 6' 3 (1.905 m)   Wt 222 lb (100.7 kg)   SpO2 98%   BMI 27.75 kg/m        Wt Readings from Last 3 Encounters:  02/19/24 222 lb (100.7 kg)  01/16/24 230 lb (104.3 kg)  01/15/24 229 lb (103.9 kg)      GENERAL:  No apparent distress, AOx3 HEENT:  No carotid bruits, +2 carotid impulses, no scleral icterus CAR: RRR no murmurs, gallops, rubs, or thrills RES:  Clear to auscultation bilaterally ABD:  Soft, nontender, nondistended, positive bowel sounds x 4 VASC:  +2 radial pulses, +2 carotid pulses NEURO:  CN 2-12 grossly intact; motor and sensory grossly intact PSYCH:  No active depression or anxiety EXT:  No edema, ecchymosis, or cyanosis  Signed, Betty Brooks K Viveka Wilmeth, MD  02/19/2024 4:54 PM    [facility] [address] [place], [place], [place]  [address] Phone: [phone]; Fax: [phone]   Note:  This document was prepared using Dragon voice recognition software and may include unintentional dictation errors.

## 2024-02-19 NOTE — Progress Notes (Unsigned)
 ZIO serial # [id number] from office inventory given to patient. He will apply monitor when they return from the beach in 4-5 days.

## 2024-02-19 NOTE — Patient Instructions (Addendum)
 Medication Instructions:  No changes *If you need a refill on your cardiac medications before your next appointment, please call your pharmacy*  Lab Work: Today: lipids, liver --first floor  If you have labs (blood work) drawn today and your tests are completely normal, you will receive your results only by: MyChart Message (if you have MyChart) OR A paper copy in the mail If you have any lab test that is abnormal or we need to change your treatment, we will call you to review the results.  Testing/Procedures: Your physician has requested that you have an echocardiogram. Echocardiography is a painless test that uses sound waves to create images of your heart. It provides your doctor with information about the size and shape of your heart and how well your heart's chambers and valves are working. This procedure takes approximately one hour. There are no restrictions for this procedure. Please do NOT wear cologne, perfume, aftershave, or lotions (deodorant is allowed). Please arrive 15 minutes prior to your appointment time.  Please note: We ask at that you not bring children with you during ultrasound (echo/ vascular) testing. Due to room size and safety concerns, children are not allowed in the ultrasound rooms during exams. Our front office staff cannot provide observation of children in our lobby area while testing is being conducted. An adult accompanying a patient to their appointment will only be allowed in the ultrasound room at the discretion of the ultrasound technician under special circumstances. We apologize for any inconvenience.   Coronary CT Angiogram - see instructions below  Follow-Up: At [facility], you and your health needs are our priority.  As part of our continuing mission to provide you with exceptional heart care, our providers are all part of one team.  This team includes your primary Cardiologist (physician) and Advanced Practice Providers or APPs (Physician  Assistants and Nurse Practitioners) who all work together to provide you with the care you need, when you need it.  Your next appointment:   6 month(s)  Provider:   One of our Advanced Practice Providers (APPs): Morse Clause, PA-C  Lamarr Satterfield, NP Miriam Shams, NP  Olivia Pavy, PA-C Josefa Beauvais, NP  Leontine Salen, PA-C Orren Fabry, PA-C  [place], PA-C Ernest Dick, NP  Damien Braver, NP Jon Hails, PA-C  Waddell Donath, PA-C    Dayna Dunn, PA-C  Scott Weaver, PA-C Lum Louis, NP Katlyn West, NP Callie Goodrich, PA-C  Evan Williams, PA-C Sheng Haley, PA-C  Xika Zhao, NP Kathleen Johnson, PA-C    Other Instructions   Your cardiac CT will be scheduled at one of the below locations:   [facility] [address] [place], [place] [address] [phone]   OR   [facility] [address]  [place], [place] [address]  If scheduled at [facility], please arrive at the [facility] and Children's Entrance (Entrance C2) of [facility] 30 minutes prior to test start time. You can use the FREE valet parking offered at entrance C (encouraged to control the heart rate for the test)  Proceed to the [facility] Radiology Department (first floor) to check-in and test prep.   All radiology patients and guests should use entrance C2 at [facility], accessed from [facility], even though the hospital's physical address listed is [address]    If scheduled at the Heart and Vascular Tower at [address], please enter the parking lot using the  [address] entrance and use the FREE valet service at the patient drop-off area. Enter the buidling and check-in with registration on the main floor.   Please follow these instructions carefully (unless otherwise directed):  An IV will be required for this test and Nitroglycerin will be given.  Hold all erectile dysfunction medications at  least 3 days (72 hrs) prior to test. (Ie viagra, cialis , sildenafil, tadalafil , etc)   On the Night Before the Test: Be sure to Drink plenty of water . Do not consume any caffeinated/decaffeinated beverages or chocolate 12 hours prior to your test. Do not take any antihistamines 12 hours prior to your test.  On the Day of the Test: Drink plenty of water  until 1 hour prior to the test. Do not eat any food 1 hour prior to test. You may take your regular medications prior to the test.  Patients who wear a continuous glucose monitor MUST remove the device prior to scanning.      After the Test: Drink plenty of water . After receiving IV contrast, you may experience a mild flushed feeling. This is normal. On occasion, you may experience a mild rash up to 24 hours after the test. This is not dangerous. If this occurs, you can take Benadryl  25 mg, Zyrtec, Claritin, or Allegra and increase your fluid intake. (Patients taking Tikosyn should avoid Benadryl , and may take Zyrtec, Claritin, or Allegra) If you experience trouble breathing, this can be serious. If it is severe call 911 IMMEDIATELY. If it is mild, please call our office.  We will call to schedule your test 2-4 weeks out understanding that some insurance companies will need an authorization prior to the service being performed.   For more information and frequently asked questions, please visit our website : [email or web address]  For non-scheduling related questions, please contact the cardiac imaging nurse navigator should you have any questions/concerns: Cardiac Imaging Nurse Navigators Direct Office Dial: [phone]   For scheduling needs, including cancellations and rescheduling, please call [place], [phone].

## 2024-02-20 ENCOUNTER — Encounter: Payer: Self-pay | Admitting: Internal Medicine

## 2024-02-20 ENCOUNTER — Ambulatory Visit: Payer: Self-pay | Admitting: Internal Medicine

## 2024-02-20 ENCOUNTER — Encounter: Payer: Self-pay | Admitting: Neurology

## 2024-02-20 ENCOUNTER — Ambulatory Visit: Admitting: Neurology

## 2024-02-20 VITALS — BP 110/60 | HR 60 | Ht 75.0 in | Wt 225.6 lb

## 2024-02-20 DIAGNOSIS — R9402 Abnormal brain scan: Secondary | ICD-10-CM

## 2024-02-20 DIAGNOSIS — G20C Parkinsonism, unspecified: Secondary | ICD-10-CM | POA: Diagnosis not present

## 2024-02-20 DIAGNOSIS — E785 Hyperlipidemia, unspecified: Secondary | ICD-10-CM

## 2024-02-20 DIAGNOSIS — G459 Transient cerebral ischemic attack, unspecified: Secondary | ICD-10-CM

## 2024-02-20 LAB — HEPATIC FUNCTION PANEL
ALT: 20 IU/L (ref 0–44)
AST: 14 IU/L (ref 0–40)
Albumin: 4.6 g/dL (ref 3.8–4.8)
Alkaline Phosphatase: 95 IU/L (ref 44–121)
Bilirubin Total: 0.8 mg/dL (ref 0.0–1.2)
Bilirubin, Direct: 0.31 mg/dL (ref 0.00–0.40)
Total Protein: 6.9 g/dL (ref 6.0–8.5)

## 2024-02-20 LAB — LIPID PANEL
Chol/HDL Ratio: 3.2 ratio (ref 0.0–5.0)
Cholesterol, Total: 122 mg/dL (ref 100–199)
HDL: 38 mg/dL — ABNORMAL LOW (ref >39)
LDL Chol Calc (NIH): 60 mg/dL (ref 0–99)
Triglycerides: 139 mg/dL (ref 0–149)
VLDL Cholesterol Cal: 24 mg/dL (ref 5–40)

## 2024-02-20 NOTE — Patient Instructions (Addendum)
 We will get your skin biopsy scheduled.  If you don't hear from us  in 3 weeks to schedule it, call us !  SAVE THE DATE!  We are planning a Parkinsons Disease educational symposium at [facility] in [place] on September 19.  More details to come!  We will have a movement disorder physician expert from [employer] coming to speak and a caregiver speaker.  We will have a panel of experts that will show you who you may need on your team of people on your journey with Parkinsons.  If you would like to be added to our email list to get further information, email [email or web address].  I hope to see you there!

## 2024-02-21 ENCOUNTER — Other Ambulatory Visit: Payer: Self-pay | Admitting: *Deleted

## 2024-02-21 DIAGNOSIS — G459 Transient cerebral ischemic attack, unspecified: Secondary | ICD-10-CM

## 2024-02-21 DIAGNOSIS — E785 Hyperlipidemia, unspecified: Secondary | ICD-10-CM

## 2024-02-21 MED ORDER — ATORVASTATIN CALCIUM 80 MG PO TABS
80.0000 mg | ORAL_TABLET | Freq: Every day | ORAL | 3 refills | Status: AC
Start: 2024-02-21 — End: ?

## 2024-02-27 ENCOUNTER — Encounter (HOSPITAL_COMMUNITY): Payer: Self-pay

## 2024-02-28 ENCOUNTER — Telehealth (HOSPITAL_COMMUNITY): Payer: Self-pay | Admitting: *Deleted

## 2024-02-28 NOTE — Telephone Encounter (Signed)
Reaching out to patient to offer assistance regarding upcoming cardiac imaging study; pt verbalizes understanding of appt date/time, parking situation and where to check in, pre-test NPO status  and verified current allergies; name and call back number provided for further questions should they arise ? ?Adalaya Irion RN Navigator Cardiac Imaging ?[facility] ?[phone] office ?[phone] cell ? ?

## 2024-02-29 ENCOUNTER — Ambulatory Visit (HOSPITAL_COMMUNITY)
Admission: RE | Admit: 2024-02-29 | Discharge: 2024-02-29 | Disposition: A | Discharge status: Home / Self Care | Source: Ambulatory Visit | Attending: Cardiology | Admitting: Cardiology

## 2024-02-29 DIAGNOSIS — R918 Other nonspecific abnormal finding of lung field: Secondary | ICD-10-CM | POA: Diagnosis not present

## 2024-02-29 DIAGNOSIS — I2510 Atherosclerotic heart disease of native coronary artery without angina pectoris: Secondary | ICD-10-CM | POA: Insufficient documentation

## 2024-02-29 DIAGNOSIS — R0609 Other forms of dyspnea: Secondary | ICD-10-CM | POA: Insufficient documentation

## 2024-02-29 MED ORDER — IOHEXOL 350 MG/ML SOLN
100.0000 mL | Freq: Once | INTRAVENOUS | Status: AC | PRN
  Administered 2024-02-29: 100 mL via INTRAVENOUS

## 2024-03-04 ENCOUNTER — Telehealth: Payer: Self-pay | Admitting: Internal Medicine

## 2024-03-04 NOTE — Telephone Encounter (Signed)
 New Message:     Please call, questions about his monitor.

## 2024-03-05 NOTE — Telephone Encounter (Signed)
 Spoke to patient he wanted to let us  know that he will not be applying the monitor till around July 25th due to some other testing he is having done.

## 2024-03-06 ENCOUNTER — Institutional Professional Consult (permissible substitution): Admitting: Neurology

## 2024-03-06 NOTE — Patient Instructions (Signed)
 INFORMATION FOR PATIENTS AFTER SKIN BIOPSY:   What You Need to Do:  1. Keep your current (large) bandage on for 24 hours and do not shower during this time. Leave today's bandage on until AFTER your next shower tomorrow.  Change your bandages every day starting tomorrow after that shower. Keep the bandages on while you shower. Change them once a day until a scab forms.  2. You can let the small steristrips fall off naturally and do not need to ever take them off.  They will eventually fall off on their own (after showering)  3. You may take showers after 24 hours, but DO NOT take tub baths, go in hot tubs or go swimming for seven days after the procedure.  4. You may use vasoline, bacitracin, or Polysporin ointment on the wounds as needed.  5. If a scab forms at the biopsy site, leave it alone.  6. If bleeding occurs, apply firm pressure for two minutes with a clean piece of gauze.   Please contact us  immediately if there is any redness, any signs of infection, or significant bleeding at the biopsy site.   Please call our office at [phone].

## 2024-03-08 ENCOUNTER — Ambulatory Visit: Admitting: Neurology

## 2024-03-08 VITALS — BP 124/72 | HR 56 | Ht 75.0 in

## 2024-03-08 DIAGNOSIS — R258 Other abnormal involuntary movements: Secondary | ICD-10-CM

## 2024-03-08 DIAGNOSIS — G20C Parkinsonism, unspecified: Secondary | ICD-10-CM | POA: Diagnosis not present

## 2024-03-08 NOTE — Procedures (Signed)
 Punch Biopsy Procedure Note  Preprocedure Diagnosis: Bradykinesia; primary parkinsonism;   Postprocedure Diagnosis: same  Locations: Site 1: Left posterior cervical Site 2: above, left knee;  Site 3: above, left foot  Indications: r/o alpha synucleinopathy  Anesthesia: 3 mL Lidocaine  1% with epinephrine  without added sodium bicarbonate  Procedure Details Patient informed of the risks (including but not limited to bleeding, pain, infection, scar and infection) and benefits of the procedure.  Informed consent obtained.  The areas which were chosen for biopsy, as above, and surrounding areas were given a sterile prep using betadyne and draped in the usual sterile fashion. The skin was then stretched perpendicular to the skin tension lines and sample removed using the 3 mm punch. Pressure applied, hemostasis achieved.   Dressing applied. The specimen(s) was sent for pathologic examination. The patient tolerated the procedure well.  Estimated Blood Loss: 0 ml  Condition: Stable  Complications: none.  Plan: 1. Instructed to keep the wound dry and covered for 24-48h and clean thereafter. 2. Warning signs of infection were reviewed.

## 2024-03-15 ENCOUNTER — Encounter: Payer: Self-pay | Admitting: Neurology

## 2024-03-18 ENCOUNTER — Ambulatory Visit: Admitting: Internal Medicine

## 2024-03-22 ENCOUNTER — Ambulatory Visit (HOSPITAL_COMMUNITY)
Admission: RE | Admit: 2024-03-22 | Discharge: 2024-03-22 | Disposition: A | Discharge status: Home / Self Care | Source: Ambulatory Visit | Attending: Cardiology | Admitting: Cardiology

## 2024-03-22 DIAGNOSIS — R0609 Other forms of dyspnea: Secondary | ICD-10-CM | POA: Diagnosis not present

## 2024-03-22 LAB — ECHOCARDIOGRAM COMPLETE
Area-P 1/2: 3.37 cm2
P 1/2 time: 396 ms
S' Lateral: 3.1 cm

## 2024-03-26 ENCOUNTER — Telehealth: Payer: Self-pay | Admitting: Internal Medicine

## 2024-03-26 NOTE — Telephone Encounter (Signed)
 Pt would like a call back regarding taking a heart monitor test. Please advise.

## 2024-03-26 NOTE — Telephone Encounter (Signed)
 Spoke with pt who states he does not want to wear heart monitor as ordered by Dr Wendel.  Pt states other tests have returned normal results.  Pt has unopened monitor.   Pt advised will forward to Dr Wendel and monitoring team to make them aware and for additional direction regarding return of monitor.  Pt verbalizes understanding and thanked Charity fundraiser for the call.

## 2024-03-27 NOTE — Telephone Encounter (Signed)
 Informed patient there is a prepaid return USPS label on the blue ZIO box that was given to him.  He can just tape the box closed and drop in the mail.  He will not be charged for any monitor that has not been applied and turned on.  I will cancel the order/ enrollment.  If he discusses with Dr. Wendel and decides to do the monitor in the future, we will just need a new order. We would then have the monitor  mailed directly to his home.

## 2024-03-28 ENCOUNTER — Telehealth: Payer: Self-pay | Admitting: Neurology

## 2024-03-28 NOTE — Telephone Encounter (Signed)
 Pts skin biopsy came back.  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  No evidence of amyloid deposition within the cutaneous nerves  Please call pt/family and let them know that skin biopsy results are back.  I would like to try to move his appointment to August 11 to review results and go over next steps.   please give him the same amount of time that he had at his September appointment.

## 2024-03-28 NOTE — Telephone Encounter (Signed)
 Pt is schedule for 04-08-24 for 60 mins at 9:15

## 2024-04-03 ENCOUNTER — Encounter: Payer: Self-pay | Admitting: Family Medicine

## 2024-04-04 NOTE — Progress Notes (Signed)
 Assessment/Plan:   1.  Parkinsons disease, new dx             - Skin biopsy positive for alpha-synuclein             - He and I discussed again that I would like to see him wean off the Xanax , particularly the daytime Xanax .  His wife and daughter seem somewhat surprised that he was not on daytime Xanax , but the patient ultimately did admit that this was the case.    Discussed that Xanax  can cause confusion and gait instability and I do not like to see my parkinsonism patients on this medication, as Parkinson's alone can cause these problems.  He can talk with his prescribing NPP about this  -We decided to add carbidopa /levodopa  25/100.  1/2 tab tid x 1 wk, then 1/2 in am & noon & 1 at night for a week, then 1/2 in am &1 at noon &night for a week, then 1 po tid at 9 AM/1 PM/5 PM.  Risks, benefits, side effects and alternative therapies were discussed.  The opportunity to ask questions was given and they were answered to the best of my ability.  The patient expressed understanding and willingness to follow the outlined treatment protocols.  -I will refer the patient to the Parkinson's program for physical and speech therapy.  We talked about the importance of safe, cardiovascular exercise in Parkinson's disease.  -We discussed community resources in the area including patient support groups and community exercise programs for PD and pt education was provided to the patient.   -discussed MIND diet.  - Patient/wife/daughter had multiple questions and I answered those to the best of my ability today.  2.  Patient has had multiple acute episodes of expressive aphasia and trouble writing with negative stroke workup             - Given that he has had repetitive episodes over the years (actually over many years, nearly a decade) of inability to find words and trouble speaking followed by vision change, one has to wonder whether or not these are not complicated migraine.  Stroke workup in the past has been  unremarkable.  Seizure also could be on the differential diagnosis, but is a bit less likely.  - Cardiology ordered zio patch and it came in the mail, but ultimately patient did not want to proceed with that   3.  GAD  - I do think that the patient needs a Veterinary surgeon.  He and I discussed this today.  - He asked about being irritated and a generalized feeling of frustration.  Ultimately, it was difficult for me to determine if this was either tremor, or if this was anxiety in general.  I told him to see if the levodopa  helped.  If not, he could follow-up with his psychiatry NPP  4.  Hyperreflexia             - Likely physiologic.  No abnormal reflexes.  May consider MRI cervical spine if neck pain develops or signs/symptoms of cervical myelopathy.  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  5.  Constipation  -discussed nature and pathophysiology and association with PD  -discussed importance of hydration.  Pt is to increase water  intake  - Discussed increased exercise.   Subjective:   Peter Novak was seen today in follow up for testing results.  Wife present and supplements the history.  Daughter on the phone and asks questions.  He had a skin biopsy done recently.  It was positive for alpha-synuclein.  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  He has had no falls since last visit.  No lightheadedness or near syncope.  He is walking some for exercise.  No PT.   Cardiology ordered Zio patch, but ultimately patient got it in the mail and did not want to wear it and it was returned.  He notes soft voice.  He notes irritation and agitation at times.    ALLERGIES:  No Known Allergies  CURRENT MEDICATIONS:  Current Meds  Medication Sig   acetaminophen  (TYLENOL ) 325 MG tablet Take 650 mg by mouth as needed for moderate pain.   ALPRAZolam  (XANAX ) 0.5  MG tablet Take 0.25 mg by mouth at bedtime.   aspirin  EC 81 MG tablet Take 1 tablet (81 mg total) by mouth daily. Swallow whole.   atorvastatin  (LIPITOR) 80 MG tablet Take 1 tablet (80 mg total) by mouth daily.   Cholecalciferol (VITAMIN D3 PO) Take by mouth daily.   clopidogrel  (PLAVIX ) 75 MG tablet Take 1 tablet (75 mg total) by mouth daily.   Cyanocobalamin  (VITAMIN B-12 SL) Place under the tongue daily.   dextroamphetamine (DEXTROSTAT) 5 MG tablet Take 5 mg by mouth as needed.   fluticasone  (FLONASE ) 50 MCG/ACT nasal spray Place 2 sprays into both nostrils daily.   hydrocortisone 2.5 % cream Apply 1 Application topically as needed.   Multiple Vitamin (MULTIVITAMIN ADULT PO) Take by mouth daily.   pantoprazole  (PROTONIX ) 40 MG tablet Take 1 tablet (40 mg total) by mouth daily.   polyethylene glycol (MIRALAX  / GLYCOLAX ) 17 g packet Take 17 g by mouth as needed.     Objective:   PHYSICAL EXAMINATION:    VITALS:   Vitals:   04/08/24 0915  BP: (!) 130/90  Pulse: (!) 55  SpO2: 99%  Weight: 222 lb 12.8 oz (101.1 kg)  Height: 6' 3 (1.905 m)    GEN:  The patient appears stated age and is in NAD. HEENT:  Normocephalic, atraumatic.  The mucous membranes are moist. The superficial temporal arteries are without ropiness or tenderness. CV:  RRR Lungs:  CTAB Neck/HEME:  There are no carotid bruits bilaterally.  Neurological examination:  Orientation: The patient is alert and oriented x3. Cranial nerves: There is good facial symmetry with facial hypomimia. The speech is fluent and clear but hypophonic. Soft palate rises symmetrically and there is no tongue deviation. Hearing is intact to conversational tone. Sensation: Sensation is intact to light touch throughout Motor: Strength is at least antigravity x4.  Movement examination: Tone: There is mild increased tone in the RUE Abnormal movements: none Coordination:  There is  decremation with RAM's, with any form of RAMS, including  alternating supination and pronation of the forearm, hand opening and closing, finger taps, heel taps and toe taps, L>R Gait and Station: The patient has mild difficulty arising out of a deep-seated chair without the use of the hands (able to do on the 2nd attempt). The patient's stride length is just slightly forward flexed.  Armswing is decreased bilaterally, right greater than left.  Stride length is good.  I have reviewed and interpreted the following labs independently    Chemistry      Component Value Date/Time   NA 138 01/23/2024 0942   NA 140 04/16/2019 1137   K 3.9  01/23/2024 0942   CL 105 01/23/2024 0942   CO2 24 01/23/2024 0942   BUN 25 (H) 01/23/2024 0942   BUN 18 04/16/2019 1137   CREATININE 0.97 01/23/2024 0942   CREATININE 1.05 01/27/2023 1609      Component Value Date/Time   CALCIUM  9.7 01/23/2024 0942   ALKPHOS 95 02/19/2024 1703   AST 14 02/19/2024 1703   ALT 20 02/19/2024 1703   BILITOT 0.8 02/19/2024 1703       Lab Results  Component Value Date   WBC 4.3 01/23/2024   HGB 13.5 01/23/2024   HCT 40.3 01/23/2024   MCV 88.3 01/23/2024   PLT 204.0 01/23/2024    Lab Results  Component Value Date   TSH 2.29 09/06/2023     Total time spent on today's visit was 50 minutes, including both face-to-face time and nonface-to-face time.  Time included that spent on review of records (prior notes available to me/labs/imaging if pertinent), discussing treatment and goals, answering patient's questions and coordinating care.  Cc:  Katrinka Garnette KIDD, MD

## 2024-04-08 ENCOUNTER — Other Ambulatory Visit: Payer: Self-pay

## 2024-04-08 ENCOUNTER — Ambulatory Visit: Admitting: Neurology

## 2024-04-08 ENCOUNTER — Encounter: Payer: Self-pay | Admitting: Neurology

## 2024-04-08 ENCOUNTER — Telehealth: Payer: Self-pay | Admitting: Neurology

## 2024-04-08 VITALS — BP 130/90 | HR 55 | Ht 75.0 in | Wt 222.8 lb

## 2024-04-08 DIAGNOSIS — G20A1 Parkinson's disease without dyskinesia, without mention of fluctuations: Secondary | ICD-10-CM | POA: Diagnosis not present

## 2024-04-08 DIAGNOSIS — G629 Polyneuropathy, unspecified: Secondary | ICD-10-CM | POA: Diagnosis not present

## 2024-04-08 DIAGNOSIS — K5901 Slow transit constipation: Secondary | ICD-10-CM

## 2024-04-08 DIAGNOSIS — E785 Hyperlipidemia, unspecified: Secondary | ICD-10-CM | POA: Diagnosis not present

## 2024-04-08 DIAGNOSIS — G459 Transient cerebral ischemic attack, unspecified: Secondary | ICD-10-CM | POA: Diagnosis not present

## 2024-04-08 DIAGNOSIS — F411 Generalized anxiety disorder: Secondary | ICD-10-CM | POA: Diagnosis not present

## 2024-04-08 MED ORDER — CARBIDOPA-LEVODOPA 25-100 MG PO TABS: ORAL_TABLET | ORAL | 1 refills | Status: DC

## 2024-04-08 NOTE — Patient Instructions (Signed)
 Start Carbidopa  Levodopa  as follows: Take 1/2 tablet three times daily, at least 30 minutes before meals (approximately 9am/1pm/5pm), for one week Then take 1/2 tablet in the morning, 1/2 tablet in the afternoon at 1pm, 1 tablet in the evening at 5pm, at least 30 minutes before meals, for one week Then take 1/2 tablet in the morning, 1 tablet in the afternoon at 1pm, 1 tablet in the evening at 5pm, at least 30 minutes before meals, for one week Then take 1 tablet three times daily at 9am/1pm/5pm, at least 30 minutes before meals   As a reminder, carbidopa /levodopa  can be taken at the same time as a carbohydrate, but we like to have you take your pill either 30 minutes before a protein source or 1 hour after as protein can interfere with carbidopa /levodopa  absorption.

## 2024-04-08 NOTE — Telephone Encounter (Signed)
 Pt called in stating the medication Carbi/levo was not sent to his pharmacy. He was seen this morning  Ph[facility] [address]

## 2024-04-08 NOTE — Telephone Encounter (Signed)
 Rx sent.

## 2024-04-09 ENCOUNTER — Ambulatory Visit: Payer: Self-pay | Admitting: Internal Medicine

## 2024-04-09 LAB — LIPOPROTEIN A (LPA): Lipoprotein (a): <8.4 nmol/L (ref <75.0)

## 2024-04-09 LAB — HEPATIC FUNCTION PANEL
ALT: 18 IU/L (ref 0–44)
AST: 16 IU/L (ref 0–40)
Albumin: 4.7 g/dL (ref 3.8–4.8)
Alkaline Phosphatase: 78 IU/L (ref 44–121)
Bilirubin Total: 0.9 mg/dL (ref 0.0–1.2)
Bilirubin, Direct: 0.28 mg/dL (ref 0.00–0.40)
Total Protein: 6.8 g/dL (ref 6.0–8.5)

## 2024-04-09 LAB — LIPID PANEL
Chol/HDL Ratio: 3.5 ratio (ref 0.0–5.0)
Cholesterol, Total: 148 mg/dL (ref 100–199)
HDL: 42 mg/dL (ref >39)
LDL Chol Calc (NIH): 80 mg/dL (ref 0–99)
Triglycerides: 146 mg/dL (ref 0–149)
VLDL Cholesterol Cal: 26 mg/dL (ref 5–40)

## 2024-04-10 NOTE — Therapy (Addendum)
 OUTPATIENT PHYSICAL THERAPY NEURO EVALUATION   Patient Name: Peter Novak MRN: [id number] DOB:1951-03-24, 73 y.o., male Today's Date: 04/15/2024   PCP: Katrinka Garnette KIDD, MD  REFERRING PROVIDER: Evonnie Asberry RAMAN, DO  END OF SESSION:  PT End of Session - 04/15/24 1619     Visit Number 1    Number of Visits 13    Date for PT Re-Evaluation 05/27/24    Authorization Type [employer]    Authorization Time Period auth submitted    PT Start Time 1536    PT Stop Time 1617    PT Time Calculation (min) 41 min    Activity Tolerance Patient tolerated treatment well    Behavior During Therapy WFL for tasks assessed/performed          Past Medical History:  Diagnosis Date   ADD (attention deficit disorder with hyperactivity)    Allergy    seasonal   Arthritis    Cancer (HCC)    prostate cancer   Cataract    bilateral,removed   Complication of anesthesia    Constipation    GERD (gastroesophageal reflux disease)    GERD (gastroesophageal reflux disease)    Hearing loss    History of skin cancer    Melanoma X 2   Hyperlipidemia    Lyme disease 01/2021   Prostate cancer (HCC) 2016   Past Surgical History:  Procedure Laterality Date   CATARACT EXTRACTION, BILATERAL  March 2022 and April 2022   CHOLECYSTECTOMY  2005   COLONOSCOPY  2008   negative   ELBOW SURGERY Right 1994   ulnar transposition   HERNIA REPAIR  2005   INNER EAR SURGERY  2006   Incus transposition;oscillculoplasty; Dr Thaddeus GRIST EAR SURGERY  2011   Stirrup resection   LYMPHADENECTOMY Bilateral 11/04/2021   Procedure: LYMPHADENECTOMY, PELVIC;  Surgeon: Renda Glance, MD;  Location: [facility];  Service: Urology;  Laterality: Bilateral;   POLYPECTOMY     ROBOT ASSISTED LAPAROSCOPIC RADICAL PROSTATECTOMY N/A 11/04/2021   Procedure: XI ROBOTIC ASSISTED LAPAROSCOPIC RADICAL PROSTATECTOMY LEVEL 3;  Surgeon: Renda Glance, MD;  Location: [facility];  Service: Urology;  Laterality: N/A;   SHOULDER  ARTHROSCOPY Left 2004   Dr Liam   TONSILLECTOMY AND ADENOIDECTOMY     VASECTOMY     Patient Active Problem List   Diagnosis Date Noted   Parkinson's disease without dyskinesia or fluctuating manifestations (HCC) 04/08/2024   Fatigue 01/16/2024   Excessive daytime sleepiness 01/16/2024   Genetic testing 09/21/2021   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   Bradycardia 11/05/2019   Recurrent episodes Dizziness/Ataxia 11/05/2019   White matter abnormality on MRI of brain 04/16/2019   Migraine with aura and with status migrainosus, not intractable 04/16/2019   Ocular migraine 04/16/2019   Migraine aura without headache (migraine equivalents) 04/16/2019   ADD (attention deficit disorder) 10/02/2014   Insomnia 10/02/2014   Erectile dysfunction 10/02/2014   Osteoarthritis 10/02/2014   Eustachian tube dysfunction 10/02/2014   GERD (gastroesophageal reflux disease) 02/12/2013   Prostate cancer (HCC) 02/12/2013   Benign neoplasm of adrenal gland 11/30/2009   Hyperglycemia 11/30/2009   History of melanoma 11/30/2009   HYPERLIPIDEMIA 07/10/2007    ONSET DATE: April 2025   REFERRING DIAG: G20.A1 (ICD-10-CM) - Parkinson's disease without dyskinesia or fluctuating manifestations (HCC)  THERAPY DIAG:  Unsteadiness on feet  Muscle weakness (generalized)  Other abnormalities of gait and mobility  Other lack of coordination  Rationale for Evaluation and Treatment: Rehabilitation  SUBJECTIVE:  SUBJECTIVE STATEMENT: Patient reports that he was diagnosed with Parkinson's last week- it sucks. Patient reports that up until the 1st of April he was doing yard work and walking 10 miles a week. Then he had a TIA. Reports no physical changes since TIA but has been fatigued. Reports that he was referred from  PCP to neurology d/t his walking pattern and lack of arm swing, fatigue, loss of energy.  Report slowness when typing and this is something he would like to improve on. Also reports changes in his writing.    Pt accompanied by: self  PERTINENT HISTORY: ADD, prostate CA, HLD, lyme disease  PAIN:  Are you having pain? No  PRECAUTIONS: None  RED FLAGS: None   WEIGHT BEARING RESTRICTIONS: No  FALLS: Has patient fallen in last 6 months? No  LIVING ENVIRONMENT: Lives with: lives with their spouse Lives in: House/apartment Stairs: 1 step to enter; 1 story home Has following equipment at home: Single point cane, Shower bench, and Grab bars  PLOF: Independent, Vocation/Vocational requirements: retired, and Leisure: reading, outdoors, walking a mile after supper  PATIENT GOALS: I don't have any idea  OBJECTIVE:  Note: Objective measures were completed at Evaluation unless otherwise noted.  DIAGNOSTIC FINDINGS: 12/13/23 brain MRI: T2 FLAIR hyperintense signal changes within the cerebral white matter (moderate) and pons (mild), similar to the prior brain MRI of 05/10/2022. Findings are nonspecific and differential considerations include chronic small vessel ischemic disease, sequelae of a prior infectious/inflammatory process and sequelae of demyelinating disease, among others.   COGNITION: Overall cognitive status: Within functional limits for tasks assessed   SENSATION: Pt denies N/T in UEs/LEs  COORDINATION: Alternating pronation/supination: slightly slowed B Alternating toe tap: hypokinesia L LE Finger to nose: WNL B   MUSCLE TONE: rigidity in L LE, especially in ankle   POSTURE: rounded shoulders, forward head, and weight shift left  LOWER EXTREMITY ROM:     Active  Right Eval Left Eval  Hip flexion    Hip extension    Hip abduction    Hip adduction    Hip internal rotation    Hip external rotation    Knee flexion    Knee extension    Ankle dorsiflexion  10 3  Ankle plantarflexion    Ankle inversion    Ankle eversion     (Blank rows = not tested)  LOWER EXTREMITY MMT:    MMT (in sitting) Right Eval Left Eval  Hip flexion 4+ 4+  Hip extension    Hip abduction 3+ 3+  Hip adduction 4 4  Hip internal rotation    Hip external rotation    Knee flexion 4+ 4  Knee extension 4 4  Ankle dorsiflexion 5 5  Ankle plantarflexion 5 5  Ankle inversion    Ankle eversion    (Blank rows = not tested)  GAIT: Findings: Assistive device utilized:None, Level of assistance: Complete Independence, and Comments: reduced R arm swing  FUNCTIONAL TESTS:  5 times sit to stand: 18.42 sec without UEs 10 meter walk test: 9.21 sec  without AD  (3.56 ft/sec)  TREATMENT DATE: 04/15/24     PATIENT EDUCATION: Education details: edu on benefits of OT for hand dexterity- pt agreeable, prognosis, POC, exam findings, edu and handout on PD community resources  Education method: Explanation, Demonstration, Tactile cues, Verbal cues, and Handouts Education comprehension: verbalized understanding  HOME EXERCISE PROGRAM: Not yet initiated   GOALS: Goals reviewed with patient? Yes  SHORT TERM GOALS: Target date: 05/06/2024  Patient to be independent with initial HEP. Baseline: HEP initiated Goal status: INITIAL    LONG TERM GOALS: Target date: 05/27/2024  Patient to be independent with advanced HEP. Baseline: Not yet initiated  Goal status: INITIAL  Patient to verbalize understanding of local Parkinson's Disease community resources including community fitness post D/C.   Baseline: Not yet initiated  Goal status: INITIAL  Patient to improve MiniBestTest score to atleast 17-21 to decrease risk of falls.  Baseline: NT Goal status: INITIAL  Patient to demonstrate 50% improvement in R UE swing during gait.  Baseline: no UE  swing Goal status: INITIAL  Patient to demonstrate 5xSTS test in <15 sec in order to decrease risk of falls.   Baseline: 18 sec Goal status: INITIAL  Patient to report participation in weekly exercise class. Baseline: Not yet initiated  Goal status: INITIAL   ASSESSMENT:  CLINICAL IMPRESSION:  Patient is a 73 y/o M, recently diagnosed with Parkinson's Disease, presenting to OPPT with c/o imbalance and fatigue for the past 4 months s/p TIA.Patient today presenting with Bradykinesia and hypokinesia with coordination testing, L LE rigidity, slightly rounded posture, limited L ankle DF AROM, B LE weakness, reduced R arm swing with gait, decreased gait speed and decreased transfer speed. Would benefit from skilled PT services 1-2 x/week for 6 weeks to address aforementioned impairments in order to optimize level of function.    OBJECTIVE IMPAIRMENTS: Abnormal gait, decreased activity tolerance, decreased balance, decreased coordination, decreased endurance, decreased ROM, decreased strength, impaired tone, and postural dysfunction.   ACTIVITY LIMITATIONS: carrying, lifting, bending, sitting, standing, squatting, stairs, transfers, bed mobility, bathing, dressing, reach over head, and hygiene/grooming  PARTICIPATION LIMITATIONS: meal prep, cleaning, laundry, community activity, yard work, and church  PERSONAL FACTORS: Age, Past/current experiences, Time since onset of injury/illness/exacerbation, and 3+ comorbidities: ADD, prostate CA, HLD, lyme disease are also affecting patient's functional outcome.   REHAB POTENTIAL: Good  CLINICAL DECISION MAKING: Evolving/moderate complexity  EVALUATION COMPLEXITY: Moderate  PLAN:  PT FREQUENCY: 1-2x/week  PT DURATION: 6 weeks  PLANNED INTERVENTIONS: 97164- PT Re-evaluation, 97750- Physical Performance Testing, 97110-Therapeutic exercises, 97530- Therapeutic activity, 97112- Neuromuscular re-education, 97535- Self Care, 02859- Manual therapy,  [phone]- Gait training, [phone]- Canalith repositioning, Patient/Family education, Balance training, Stair training, Taping, Vestibular training, Cryotherapy, and Moist heat  PLAN FOR NEXT SESSION: mini best test and initiate HEP, hip quad/HS strengthening, balance with squatting and picking things up from floor    Louana Terrilyn Christians, PT, DPT 04/15/24 4:37 PM  [facility] [address], [address] [place], [place] [address] Phone # [phone] Fax # [phone]   Referring diagnosis? G20.A1 (ICD-10-CM) - Parkinson's disease without dyskinesia or fluctuating manifestations (HCC) Treatment diagnosis? (if different than referring diagnosis)  Unsteadiness on feet  Muscle weakness (generalized)  Other abnormalities of gait and mobility  Other lack of coordination What was this (referring dx) caused by? []  Surgery []  Fall []  Ongoing issue []  Arthritis [x]  Other: ____________  Laterality: []  Rt []  Lt [x]  Both  Check all possible CPT codes:  *CHOOSE 10 OR  LESS*    See Planned Interventions listed in the Plan section of the Evaluation.

## 2024-04-15 ENCOUNTER — Other Ambulatory Visit: Payer: Self-pay

## 2024-04-15 ENCOUNTER — Ambulatory Visit: Attending: Neurology | Admitting: Physical Therapy

## 2024-04-15 ENCOUNTER — Encounter: Payer: Self-pay | Admitting: Physical Therapy

## 2024-04-15 ENCOUNTER — Telehealth: Payer: Self-pay | Admitting: Physical Therapy

## 2024-04-15 DIAGNOSIS — G20A1 Parkinson's disease without dyskinesia, without mention of fluctuations: Secondary | ICD-10-CM | POA: Diagnosis not present

## 2024-04-15 DIAGNOSIS — M6281 Muscle weakness (generalized): Secondary | ICD-10-CM | POA: Diagnosis not present

## 2024-04-15 DIAGNOSIS — R2689 Other abnormalities of gait and mobility: Secondary | ICD-10-CM | POA: Diagnosis not present

## 2024-04-15 DIAGNOSIS — R29818 Other symptoms and signs involving the nervous system: Secondary | ICD-10-CM | POA: Diagnosis not present

## 2024-04-15 DIAGNOSIS — R278 Other lack of coordination: Secondary | ICD-10-CM | POA: Diagnosis not present

## 2024-04-15 DIAGNOSIS — R2681 Unsteadiness on feet: Secondary | ICD-10-CM | POA: Diagnosis not present

## 2024-04-15 NOTE — Telephone Encounter (Signed)
 Hi Dr. Evonnie,  Mr. Czajka was evaluated by PT today.  The patient would benefit from OT evaluation for difficulty writing.   If you agree, please place an order in [facility] in [facility] or fax the order to [phone].  Thank you,  Louana Terrilyn Christians, PT, DPT 04/15/24 4:40 PM  [facility], [facility] [address] [place], [place]  [address] Phone:  [phone] Fax:  [phone]

## 2024-04-16 ENCOUNTER — Other Ambulatory Visit: Payer: Self-pay

## 2024-04-16 ENCOUNTER — Encounter: Payer: Self-pay | Admitting: Family Medicine

## 2024-04-16 ENCOUNTER — Ambulatory Visit: Admitting: Family Medicine

## 2024-04-16 VITALS — BP 130/72 | HR 51 | Temp 97.2°F | Ht 75.0 in | Wt 220.8 lb

## 2024-04-16 DIAGNOSIS — E785 Hyperlipidemia, unspecified: Secondary | ICD-10-CM | POA: Diagnosis not present

## 2024-04-16 DIAGNOSIS — R6889 Other general symptoms and signs: Secondary | ICD-10-CM

## 2024-04-16 DIAGNOSIS — Z8673 Personal history of transient ischemic attack (TIA), and cerebral infarction without residual deficits: Secondary | ICD-10-CM | POA: Diagnosis not present

## 2024-04-16 DIAGNOSIS — G20A1 Parkinson's disease without dyskinesia, without mention of fluctuations: Secondary | ICD-10-CM

## 2024-04-16 NOTE — Progress Notes (Signed)
 Phone [phone] In person visit   Subjective:   Peter Novak is a 73 y.o. year old very pleasant male patient who presents for/with See problem oriented charting Chief Complaint  Patient presents with   Fatigue   Medical Management of Chronic Issues    Would like to speak about labs done 04/08/2024.    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    Priority: High   ADD (attention deficit disorder) 10/02/2014    Priority: Medium    Insomnia 10/02/2014    Priority: Medium    Osteoarthritis 10/02/2014    Priority: Medium    Prostate cancer (HCC) 02/12/2013    Priority: Medium    Hyperglycemia 11/30/2009    Priority: Medium    History of melanoma 11/30/2009    Priority: Medium    HYPERLIPIDEMIA 07/10/2007    Priority: Medium    Bradycardia 11/05/2019    Priority: Low   Migraine aura without headache (migraine equivalents) 04/16/2019    Priority: Low   Erectile dysfunction 10/02/2014    Priority: Low   Eustachian tube dysfunction 10/02/2014    Priority: Low   GERD (gastroesophageal reflux disease) 02/12/2013    Priority: Low   Benign neoplasm of adrenal gland 11/30/2009    Priority: Low   Parkinson's disease without dyskinesia or fluctuating manifestations (HCC) 04/08/2024   Fatigue 01/16/2024   Excessive daytime sleepiness 01/16/2024   Genetic testing 09/21/2021   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     ALPRAZolam  (XANAX ) 0.5 MG tablet Take 0.25 mg by mouth at bedtime.      aspirin  EC 81 MG tablet Take 1 tablet (81 mg total) by mouth daily. Swallow whole. 30 tablet 12   atorvastatin  (LIPITOR) 80 MG tablet Take 1 tablet (80 mg total) by mouth daily. 90 tablet 3   carbidopa -levodopa  (SINEMET  IR) 25-100 MG tablet 1/2 tab three times a day  1 wk, then 1/2 in am & noon & 1 at night for a week, then 1/2 in am &1 at noon & 1 at night for a week, then 1 three times at 9 AM/1 PM/5 PM 270 tablet 1   Cholecalciferol (VITAMIN D3 PO) Take by mouth daily.     clopidogrel  (PLAVIX ) 75 MG tablet Take 1 tablet (75 mg total) by mouth daily. 90 tablet 3   Cyanocobalamin  (VITAMIN B-12 SL) Place under the tongue daily.     dextroamphetamine (DEXTROSTAT) 5 MG tablet Take 5 mg by mouth as needed.     fluticasone  (FLONASE ) 50 MCG/ACT nasal spray Place 2 sprays into both nostrils daily.     hydrocortisone 2.5 % cream Apply 1 Application topically as needed.     Multiple Vitamin (MULTIVITAMIN ADULT PO) Take by mouth daily.     pantoprazole  (PROTONIX ) 40 MG tablet Take 1 tablet (40 mg total) by mouth daily. 30 tablet 5   polyethylene glycol (MIRALAX  / GLYCOLAX ) 17 g packet Take 17 g by mouth as needed.     No current facility-administered medications for  this visit.     Objective:  BP 130/72 (BP Location: Left Arm, Patient Position: Sitting, Cuff Size: Normal)   Pulse (!) 51   Temp (!) 97.2 F (36.2 C) (Temporal)   Ht 6' 3 (1.905 m)   Wt 220 lb 12.8 oz (100.2 kg)   SpO2 96%   BMI 27.60 kg/m  Gen: NAD, resting comfortably CV: RRR no murmurs rubs or gallops Lungs: CTAB no crackles, wheeze, rhonchi Ext: no edema Skin: warm, dry     Assessment and Plan   # Parkinson's-diagnosed in 2025 and followed by Dr. Evonnie S: Medication: Carbidopa  levodopa  25-100 mg working towards 3 times a day -Extensive workup in regards to fatigue-slightly low B12.  No sleep apnea present per Dr. Chalice June 2025, reassuring echocardiogram - Dr. Evonnie has recommended pulling off alprazolam  A/P:  Parkinson's new diagnosis- wants to continue current carbidopa  titration and see how he feels- hesitant to stop alprazolam  yet -also going to work with occupational therapy for fingers on physical therapy and working with physical therapy also speech therapy -encouraged exercise   #History of recurrent TIA first 2010 and as late as 2025 #nonobstructive CAD  #hyperlipidemia-LPA not elevated S: Medication:Clopidogrel  75 mg, atorvastatin  80 mg prescription from cardiology, 80- but he's only on 20mg , aspirin  81 mg -trialed off statin without reduction in cramping- started back on 20 mg and that was the dose for the LDL of 80- prior to that in June when on 40 mg his LDL was 60.  -Neurology Dr. Darleen has recommended loop recorder and he has declined Lab Results  Component Value Date   CHOL 148 04/08/2024   HDL 42 04/08/2024   LDLCALC 80 04/08/2024   LDLDIRECT 73 07/09/2020   TRIG 146 04/08/2024   CHOLHDL 3.5 04/08/2024  A/P: lipids above goal on 20 mg dose of atorvastatin  as above- he plans to trial 40 mg and work on healthy eating and regular exercise - last time on 40 mg his LDL was 60 so we are hopeful with recurrent TIA history to get LDL below 55- if not successful with these measures- we could try Zetia- he's open to reading about this and considering over increasing statin  Nonobstructive CAD but not thought to be cause of his shortness of breath- possibly more parkinsons.   History of TIA- recommended cardiac monitoring- he plans to establish with new cardiologist first    Recommended follow up: Return for next already scheduled visit or sooner if needed. Future Appointments  Date Time Provider Department Center  04/17/2024  9:30 AM Campbell Louana POUR, [place] [facility]  04/22/2024 11:45 AM Halpin, Mason K, PT [facility]  04/25/2024  9:30 AM Starlet Greig ORN, PT [facility]  04/30/2024  9:30 AM Nonato, Gellen April Ma L, [place] [facility]  05/02/2024  9:30 AM Nonato, Gellen April  Ma L, PT [facility]  05/06/2024  9:30 AM Campbell Louana POUR, PT [facility]  05/08/2024  9:30 AM Campbell Louana POUR, PT [facility]  05/10/2024  9:30 AM Jacelyn Lupita NOVAK, CCC-SLP [facility]  05/13/2024  9:30 AM Campbell Louana POUR, PT [facility]  05/15/2024  9:30 AM Campbell Louana POUR, PT [facility]  05/21/2024  9:30 AM Nonato, Gellen April Ma L, PT [facility]  05/23/2024  9:30 AM Nonato, Gellen April Ma L, PT [facility]  05/28/2024  9:30 AM Campbell Louana POUR, PT [facility]  05/31/2024  9:30 AM Starlet Greig ORN, PT [facility]  07/03/2024  9:00 AM  Ines Onetha NOVAK, MD [facility] None  08/08/2024 11:15 AM Tat, Asberry RAMAN, DO [facility] None  09/06/2024  9:00 AM Katrinka Garnette KIDD, MD [facility]    Lab/Order associations:   ICD-10-CM   1. Hyperlipidemia, unspecified hyperlipidemia type  E78.5 Lipid panel    Comp Met (CMET)      No orders of the defined types were placed in this encounter.   Return precautions advised.  Garnette Katrinka, MD

## 2024-04-16 NOTE — Therapy (Signed)
 OUTPATIENT PHYSICAL THERAPY NEURO TREATMENT   Patient Name: Peter Novak MRN: [id number] DOB:September 06, 1950, 73 y.o., male Today's Date: 04/17/2024   PCP: Katrinka Garnette KIDD, MD  REFERRING PROVIDER: Evonnie Asberry RAMAN, DO  END OF SESSION:  PT End of Session - 04/17/24 1016     Visit Number 2    Number of Visits 13    Date for PT Re-Evaluation 05/27/24    Authorization Type [employer]    Authorization Time Period approved 8 visits from 04/15/2024 - 05/27/2024    PT Start Time 0932    PT Stop Time 1015    PT Time Calculation (min) 43 min    Equipment Utilized During Treatment Gait belt    Activity Tolerance Patient tolerated treatment well    Behavior During Therapy WFL for tasks assessed/performed           Past Medical History:  Diagnosis Date   ADD (attention deficit disorder with hyperactivity)    Allergy    seasonal   Arthritis    Cancer (HCC)    prostate cancer   Cataract    bilateral,removed   Complication of anesthesia    Constipation    GERD (gastroesophageal reflux disease)    GERD (gastroesophageal reflux disease)    Hearing loss    History of skin cancer    Melanoma X 2   Hyperlipidemia    Lyme disease 01/2021   Prostate cancer (HCC) 2016   Past Surgical History:  Procedure Laterality Date   CATARACT EXTRACTION, BILATERAL  March 2022 and April 2022   CHOLECYSTECTOMY  2005   COLONOSCOPY  2008   negative   ELBOW SURGERY Right 1994   ulnar transposition   EYE SURGERY  2021   cataract   HERNIA REPAIR  2005   INNER EAR SURGERY  2006   Incus transposition;oscillculoplasty; Dr Thaddeus GRIST EAR SURGERY  2011   Stirrup resection   LYMPHADENECTOMY Bilateral 11/04/2021   Procedure: LYMPHADENECTOMY, PELVIC;  Surgeon: Renda Glance, MD;  Location: [facility];  Service: Urology;  Laterality: Bilateral;   POLYPECTOMY     ROBOT ASSISTED LAPAROSCOPIC RADICAL PROSTATECTOMY N/A 11/04/2021   Procedure: XI ROBOTIC ASSISTED LAPAROSCOPIC RADICAL PROSTATECTOMY  LEVEL 3;  Surgeon: Renda Glance, MD;  Location: [facility];  Service: Urology;  Laterality: N/A;   SHOULDER ARTHROSCOPY Left 2004   Dr Liam   TONSILLECTOMY AND ADENOIDECTOMY     VASECTOMY     Patient Active Problem List   Diagnosis Date Noted   Parkinson's disease without dyskinesia or fluctuating manifestations (HCC) 04/08/2024   Fatigue 01/16/2024   Excessive daytime sleepiness 01/16/2024   Genetic testing 09/21/2021   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   Bradycardia 11/05/2019   Recurrent episodes Dizziness/Ataxia 11/05/2019   White matter abnormality on MRI of brain 04/16/2019   Migraine with aura and with status migrainosus, not intractable 04/16/2019   Ocular migraine 04/16/2019   Migraine aura without headache (migraine equivalents) 04/16/2019   ADD (attention deficit disorder) 10/02/2014   Insomnia 10/02/2014   Erectile dysfunction 10/02/2014   Osteoarthritis 10/02/2014   Eustachian tube dysfunction 10/02/2014   GERD (gastroesophageal reflux disease) 02/12/2013   Prostate cancer (HCC) 02/12/2013   Benign neoplasm of adrenal gland 11/30/2009   Hyperglycemia 11/30/2009   History of melanoma 11/30/2009   HYPERLIPIDEMIA 07/10/2007    ONSET DATE: April 2025   REFERRING DIAG: G20.A1 (ICD-10-CM) - Parkinson's disease without dyskinesia or fluctuating manifestations (HCC)  THERAPY DIAG:  Unsteadiness on feet  Muscle weakness (  generalized)  Other abnormalities of gait and mobility  Other lack of coordination  Rationale for Evaluation and Treatment: Rehabilitation  SUBJECTIVE:                                                                                                                                                                                             SUBJECTIVE STATEMENT: It's alright.   Pt accompanied by: self  PERTINENT HISTORY: ADD, prostate CA, HLD, lyme disease  PAIN:  Are you having pain? No  PRECAUTIONS: None  RED  FLAGS: None   WEIGHT BEARING RESTRICTIONS: No  FALLS: Has patient fallen in last 6 months? No  LIVING ENVIRONMENT: Lives with: lives with their spouse Lives in: House/apartment Stairs: 1 step to enter; 1 story home Has following equipment at home: Single point cane, Shower bench, and Grab bars  PLOF: Independent, Vocation/Vocational requirements: retired, and Leisure: reading, outdoors, walking a mile after supper  PATIENT GOALS: I don't have any idea  OBJECTIVE:       TODAY'S TREATMENT: 04/17/24 Activity Comments  Mini Best Test  19                   HOME EXERCISE PROGRAM Access Code: [email or web address] URL: [email or web address] Date: 04/17/2024 Prepared by: [facility] - Outpatient  Rehab - [facility]  Program Notes perform balance exercises in a corner for safety  Exercises - Single Leg Heel Raise with Unilateral Counter Support  - 1 x daily - 5 x weekly - 2 sets - 10 reps - Squat with Chair Touch  - 1 x daily - 5 x weekly - 2 sets - 10 reps - Side Stepping with Resistance at Ankles and Counter Support  - 1 x daily - 5 x weekly - 2 sets - 1 min hold - Romberg Stance with Eyes Closed  - 1 x daily - 5 x weekly - 2-3 sets - 30 sec hold - Romberg Stance with Head Rotation  - 1 x daily - 5 x weekly - 2-3 sets - 30 sec hold   PATIENT EDUCATION: Education details: edu on exam findings and pt's remaining challenges , HEP with edu for safety, answered pt's questions Person educated: Patient Education method: Explanation, Demonstration, Tactile cues, Verbal cues, and Handouts Education comprehension: verbalized understanding and returned demonstration     Note: Objective measures were completed at Evaluation unless otherwise noted.  DIAGNOSTIC FINDINGS: 12/13/23 brain MRI: T2 FLAIR hyperintense signal changes within the cerebral white matter (moderate) and pons (mild), similar to the prior brain MRI of 05/10/2022. Findings are nonspecific and  differential considerations include chronic small vessel ischemic disease,  sequelae of a prior infectious/inflammatory process and sequelae of demyelinating disease, among others.   COGNITION: Overall cognitive status: Within functional limits for tasks assessed   SENSATION: Pt denies N/T in UEs/LEs  COORDINATION: Alternating pronation/supination: slightly slowed B Alternating toe tap: hypokinesia L LE Finger to nose: WNL B   MUSCLE TONE: rigidity in L LE, especially in ankle   POSTURE: rounded shoulders, forward head, and weight shift left  LOWER EXTREMITY ROM:     Active  Right Eval Left Eval  Hip flexion    Hip extension    Hip abduction    Hip adduction    Hip internal rotation    Hip external rotation    Knee flexion    Knee extension    Ankle dorsiflexion 10 3  Ankle plantarflexion    Ankle inversion    Ankle eversion     (Blank rows = not tested)  LOWER EXTREMITY MMT:    MMT (in sitting) Right Eval Left Eval  Hip flexion 4+ 4+  Hip extension    Hip abduction 3+ 3+  Hip adduction 4 4  Hip internal rotation    Hip external rotation    Knee flexion 4+ 4  Knee extension 4 4  Ankle dorsiflexion 5 5  Ankle plantarflexion 5 5  Ankle inversion    Ankle eversion    (Blank rows = not tested)  GAIT: Findings: Assistive device utilized:None, Level of assistance: Complete Independence, and Comments: reduced R arm swing  FUNCTIONAL TESTS:  5 times sit to stand: 18.42 sec without UEs 10 meter walk test: 9.21 sec  without AD  (3.56 ft/sec)                                                                                                                              TREATMENT DATE: 04/15/24     PATIENT EDUCATION: Education details: edu on benefits of OT for hand dexterity- pt agreeable, prognosis, POC, exam findings, edu and handout on PD community resources  Education method: Explanation, Demonstration, Tactile cues, Verbal cues, and Handouts Education  comprehension: verbalized understanding  HOME EXERCISE PROGRAM: Not yet initiated   GOALS: Goals reviewed with patient? Yes  SHORT TERM GOALS: Target date: 05/06/2024  Patient to be independent with initial HEP. Baseline: HEP initiated Goal status: IN PROGRESS    LONG TERM GOALS: Target date: 05/27/2024  Patient to be independent with advanced HEP. Baseline: Not yet initiated  Goal status: IN PROGRESS  Patient to verbalize understanding of local Parkinson's Disease community resources including community fitness post D/C.   Baseline: Not yet initiated  Goal status: IN PROGRESS  Patient to improve MiniBestTest score to atleast 17-21 to decrease risk of falls.  Baseline: 19 04/17/24  Goal status: IN PROGRESS 04/17/24  Patient to demonstrate 50% improvement in R UE swing during gait.  Baseline: no UE swing Goal status: IN PROGRESS  Patient to demonstrate 5xSTS test in <15 sec in order to  decrease risk of falls.   Baseline: 18 sec Goal status: IN PROGRESS  Patient to report participation in weekly exercise class. Baseline: Not yet initiated  Goal status: IN PROGRESS   ASSESSMENT:  CLINICAL IMPRESSION:  Patient arrived to session without complaints. Patient scored 19 on Mini Best Test. EC balance, compliant surface, narrow BOS, and balance with head turns was the most challenging for patient today. Initiated HEP to include LE strengthening and balance challenges. Patient reported understanding of edu provided and without complaints upon leaving.    OBJECTIVE IMPAIRMENTS: Abnormal gait, decreased activity tolerance, decreased balance, decreased coordination, decreased endurance, decreased ROM, decreased strength, impaired tone, and postural dysfunction.   ACTIVITY LIMITATIONS: carrying, lifting, bending, sitting, standing, squatting, stairs, transfers, bed mobility, bathing, dressing, reach over head, and hygiene/grooming  PARTICIPATION LIMITATIONS: meal prep, cleaning,  laundry, community activity, yard work, and church  PERSONAL FACTORS: Age, Past/current experiences, Time since onset of injury/illness/exacerbation, and 3+ comorbidities: ADD, prostate CA, HLD, lyme disease are also affecting patient's functional outcome.   REHAB POTENTIAL: Good  CLINICAL DECISION MAKING: Evolving/moderate complexity  EVALUATION COMPLEXITY: Moderate  PLAN:  PT FREQUENCY: 1-2x/week  PT DURATION: 6 weeks  PLANNED INTERVENTIONS: 97164- PT Re-evaluation, 97750- Physical Performance Testing, 97110-Therapeutic exercises, 97530- Therapeutic activity, 97112- Neuromuscular re-education, 97535- Self Care, 02859- Manual therapy, [phone]- Gait training, [phone]- Canalith repositioning, Patient/Family education, Balance training, Stair training, Taping, Vestibular training, Cryotherapy, and Moist heat  PLAN FOR NEXT SESSION: review HEP, hip quad/HS strengthening, balance with squatting and picking things up from floor, . EC balance, compliant surface, narrow BOS, and balance with head turns   Louana Terrilyn Christians, PT, DPT 04/17/24 10:18 AM  [facility]patient Rehab at [facility] [address] [place], [address] [place], [place] [address] Phone # [phone] Fax # [phone] '

## 2024-04-16 NOTE — Patient Instructions (Addendum)
 lipids above goal on 20 mg dose of atorvastatin  as above- he plans to trial 40 mg atorvastatin  and work on healthy eating and regular exercise - last time on 40 mg his LDL was 60 so we are hopeful with recurrent TIA history to get LDL below 55- if not successful with these measures- we could try Zetia- he's open to reading about this and considering over increasing statin  Schedule a lab visit at the check out desk in 2 months. Return for future fasting labs meaning nothing but water  after midnight please. Ok to take your medications with water .   Recommended follow up: Return for next already scheduled visit or sooner if needed.

## 2024-04-17 ENCOUNTER — Encounter: Payer: Self-pay | Admitting: Physical Therapy

## 2024-04-17 ENCOUNTER — Ambulatory Visit: Admitting: Physical Therapy

## 2024-04-17 DIAGNOSIS — R2689 Other abnormalities of gait and mobility: Secondary | ICD-10-CM

## 2024-04-17 DIAGNOSIS — R2681 Unsteadiness on feet: Secondary | ICD-10-CM | POA: Diagnosis not present

## 2024-04-17 DIAGNOSIS — R29818 Other symptoms and signs involving the nervous system: Secondary | ICD-10-CM | POA: Diagnosis not present

## 2024-04-17 DIAGNOSIS — R278 Other lack of coordination: Secondary | ICD-10-CM | POA: Diagnosis not present

## 2024-04-17 DIAGNOSIS — M6281 Muscle weakness (generalized): Secondary | ICD-10-CM | POA: Diagnosis not present

## 2024-04-17 DIAGNOSIS — G20A1 Parkinson's disease without dyskinesia, without mention of fluctuations: Secondary | ICD-10-CM | POA: Diagnosis not present

## 2024-04-22 ENCOUNTER — Ambulatory Visit

## 2024-04-23 ENCOUNTER — Ambulatory Visit: Admitting: Physical Therapy

## 2024-04-23 ENCOUNTER — Encounter: Payer: Self-pay | Admitting: Physical Therapy

## 2024-04-23 DIAGNOSIS — G20A1 Parkinson's disease without dyskinesia, without mention of fluctuations: Secondary | ICD-10-CM | POA: Diagnosis not present

## 2024-04-23 DIAGNOSIS — R2689 Other abnormalities of gait and mobility: Secondary | ICD-10-CM

## 2024-04-23 DIAGNOSIS — M6281 Muscle weakness (generalized): Secondary | ICD-10-CM | POA: Diagnosis not present

## 2024-04-23 DIAGNOSIS — R2681 Unsteadiness on feet: Secondary | ICD-10-CM

## 2024-04-23 DIAGNOSIS — R29818 Other symptoms and signs involving the nervous system: Secondary | ICD-10-CM | POA: Diagnosis not present

## 2024-04-23 DIAGNOSIS — R278 Other lack of coordination: Secondary | ICD-10-CM | POA: Diagnosis not present

## 2024-04-23 NOTE — Therapy (Signed)
 OUTPATIENT PHYSICAL THERAPY NEURO TREATMENT   Patient Name: Peter Novak MRN: [id number] DOB:03-Aug-1951, 73 y.o., male Today's Date: 04/23/2024   PCP: Katrinka Garnette KIDD, MD  REFERRING PROVIDER: Evonnie Asberry RAMAN, DO  END OF SESSION:  PT End of Session - 04/23/24 1447     Visit Number 3    Number of Visits 13    Date for PT Re-Evaluation 05/27/24    Authorization Type [employer]    Authorization Time Period approved 8 visits from 04/15/2024 - 05/27/2024    Authorization - Visit Number 3    Authorization - Number of Visits 8    PT Start Time 1448    PT Stop Time 1528    PT Time Calculation (min) 40 min    Equipment Utilized During Treatment --    Activity Tolerance Patient tolerated treatment well    Behavior During Therapy WFL for tasks assessed/performed            Past Medical History:  Diagnosis Date   ADD (attention deficit disorder with hyperactivity)    Allergy    seasonal   Arthritis    Cancer (HCC)    prostate cancer   Cataract    bilateral,removed   Complication of anesthesia    Constipation    GERD (gastroesophageal reflux disease)    GERD (gastroesophageal reflux disease)    Hearing loss    History of skin cancer    Melanoma X 2   Hyperlipidemia    Lyme disease 01/2021   Prostate cancer (HCC) 2016   Past Surgical History:  Procedure Laterality Date   CATARACT EXTRACTION, BILATERAL  March 2022 and April 2022   CHOLECYSTECTOMY  2005   COLONOSCOPY  2008   negative   ELBOW SURGERY Right 1994   ulnar transposition   EYE SURGERY  2021   cataract   HERNIA REPAIR  2005   INNER EAR SURGERY  2006   Incus transposition;oscillculoplasty; Dr Thaddeus GRIST EAR SURGERY  2011   Stirrup resection   LYMPHADENECTOMY Bilateral 11/04/2021   Procedure: LYMPHADENECTOMY, PELVIC;  Surgeon: Renda Glance, MD;  Location: [facility];  Service: Urology;  Laterality: Bilateral;   POLYPECTOMY     ROBOT ASSISTED LAPAROSCOPIC RADICAL PROSTATECTOMY N/A 11/04/2021    Procedure: XI ROBOTIC ASSISTED LAPAROSCOPIC RADICAL PROSTATECTOMY LEVEL 3;  Surgeon: Renda Glance, MD;  Location: [facility];  Service: Urology;  Laterality: N/A;   SHOULDER ARTHROSCOPY Left 2004   Dr Liam   TONSILLECTOMY AND ADENOIDECTOMY     VASECTOMY     Patient Active Problem List   Diagnosis Date Noted   Parkinson's disease without dyskinesia or fluctuating manifestations (HCC) 04/08/2024   Fatigue 01/16/2024   Excessive daytime sleepiness 01/16/2024   Genetic testing 09/21/2021   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   Bradycardia 11/05/2019   Recurrent episodes Dizziness/Ataxia 11/05/2019   White matter abnormality on MRI of brain 04/16/2019   Migraine with aura and with status migrainosus, not intractable 04/16/2019   Ocular migraine 04/16/2019   Migraine aura without headache (migraine equivalents) 04/16/2019   ADD (attention deficit disorder) 10/02/2014   Insomnia 10/02/2014   Erectile dysfunction 10/02/2014   Osteoarthritis 10/02/2014   Eustachian tube dysfunction 10/02/2014   GERD (gastroesophageal reflux disease) 02/12/2013   Prostate cancer (HCC) 02/12/2013   Benign neoplasm of adrenal gland 11/30/2009   Hyperglycemia 11/30/2009   History of melanoma 11/30/2009   HYPERLIPIDEMIA 07/10/2007    ONSET DATE: April 2025   REFERRING DIAG: G20.A1 (ICD-10-CM) - Parkinson's  disease without dyskinesia or fluctuating manifestations (HCC)  THERAPY DIAG:  Unsteadiness on feet  Muscle weakness (generalized)  Other abnormalities of gait and mobility  Rationale for Evaluation and Treatment: Rehabilitation  SUBJECTIVE:                                                                                                                                                                                             SUBJECTIVE STATEMENT: Pt reports today is an off-day.  Feel more off balance today.  Yesterday was a good day-did a lot in the yard.     Pt accompanied by:  self  PERTINENT HISTORY: ADD, prostate CA, HLD, lyme disease  PAIN:  Are you having pain? No  PRECAUTIONS: None  RED FLAGS: None   WEIGHT BEARING RESTRICTIONS: No  FALLS: Has patient fallen in last 6 months? No  LIVING ENVIRONMENT: Lives with: lives with their spouse Lives in: House/apartment Stairs: 1 step to enter; 1 story home Has following equipment at home: Single point cane, Tour manager, and Grab bars  PLOF: Independent, Vocation/Vocational requirements: retired, and Leisure: reading, outdoors, walking a mile after supper  PATIENT GOALS: I don't have any idea  OBJECTIVE:     TODAY'S TREATMENT: 04/23/2024 Activity Comments  Review of HEP-see below Used 3# weights instead of red theraband (pt has 2.5# weights at home)  BLE strengthening: LAQ, 3 x 10 reps Sit to stand,  5 reps Standing hamstring curls 3 x 10 reps 3#, BLE Cues for slowed pace Cues for glut, quad activation  Forward/back walking supervision  Tandem gait with light UE support Cues to look ahead at visual target  Forward step ups 2 x 10 BUE support, cues for large amplitude for foot clearance         HOME EXERCISE PROGRAM Access Code: [email or web address] URL: [email or web address] Date: 04/17/2024 Prepared by: [facility] - Outpatient  Rehab - [facility]  Program Notes perform balance exercises in a corner for safety  Exercises - Single Leg Heel Raise with Unilateral Counter Support  - 1 x daily - 5 x weekly - 2 sets - 10 reps - Squat with Chair Touch  - 1 x daily - 5 x weekly - 2 sets - 10 reps - Side Stepping with Resistance at Ankles and Counter Support  - 1 x daily - 5 x weekly - 2 sets - 1 min hold - Romberg Stance with Eyes Closed  - 1 x daily - 5 x weekly - 2-3 sets - 30 sec hold - Romberg Stance with Head Rotation  - 1 x daily - 5 x weekly - 2-3 sets -  30 sec hold   PATIENT EDUCATION: Education details: Answered pt's questions on balance measures; reviewed HEP Person  educated: Patient Education method: Explanation, Demonstration, Tactile cues, Verbal cues, and Handouts Education comprehension: verbalized understanding and returned demonstration     Note: Objective measures were completed at Evaluation unless otherwise noted.  DIAGNOSTIC FINDINGS: 12/13/23 brain MRI: T2 FLAIR hyperintense signal changes within the cerebral white matter (moderate) and pons (mild), similar to the prior brain MRI of 05/10/2022. Findings are nonspecific and differential considerations include chronic small vessel ischemic disease, sequelae of a prior infectious/inflammatory process and sequelae of demyelinating disease, among others.   COGNITION: Overall cognitive status: Within functional limits for tasks assessed   SENSATION: Pt denies N/T in UEs/LEs  COORDINATION: Alternating pronation/supination: slightly slowed B Alternating toe tap: hypokinesia L LE Finger to nose: WNL B   MUSCLE TONE: rigidity in L LE, especially in ankle   POSTURE: rounded shoulders, forward head, and weight shift left  LOWER EXTREMITY ROM:     Active  Right Eval Left Eval  Hip flexion    Hip extension    Hip abduction    Hip adduction    Hip internal rotation    Hip external rotation    Knee flexion    Knee extension    Ankle dorsiflexion 10 3  Ankle plantarflexion    Ankle inversion    Ankle eversion     (Blank rows = not tested)  LOWER EXTREMITY MMT:    MMT (in sitting) Right Eval Left Eval  Hip flexion 4+ 4+  Hip extension    Hip abduction 3+ 3+  Hip adduction 4 4  Hip internal rotation    Hip external rotation    Knee flexion 4+ 4  Knee extension 4 4  Ankle dorsiflexion 5 5  Ankle plantarflexion 5 5  Ankle inversion    Ankle eversion    (Blank rows = not tested)  GAIT: Findings: Assistive device utilized:None, Level of assistance: Complete Independence, and Comments: reduced R arm swing  FUNCTIONAL TESTS:  5 times sit to stand: 18.42 sec without  UEs 10 meter walk test: 9.21 sec  without AD  (3.56 ft/sec)                                                                                                                              TREATMENT DATE: 04/15/24     PATIENT EDUCATION: Education details: edu on benefits of OT for hand dexterity- pt agreeable, prognosis, POC, exam findings, edu and handout on PD community resources  Education method: Explanation, Demonstration, Tactile cues, Verbal cues, and Handouts Education comprehension: verbalized understanding  HOME EXERCISE PROGRAM: Not yet initiated   GOALS: Goals reviewed with patient? Yes  SHORT TERM GOALS: Target date: 05/06/2024  Patient to be independent with initial HEP. Baseline: HEP initiated Goal status: IN PROGRESS    LONG TERM GOALS: Target date: 05/27/2024  Patient to be independent with advanced HEP. Baseline:  Not yet initiated  Goal status: IN PROGRESS  Patient to verbalize understanding of local Parkinson's Disease community resources including community fitness post D/C.   Baseline: Not yet initiated  Goal status: IN PROGRESS  Patient to improve MiniBestTest score to atleast 17-21 to decrease risk of falls.  Baseline: 19 04/17/24  Goal status: IN PROGRESS 04/17/24  Patient to demonstrate 50% improvement in R UE swing during gait.  Baseline: no UE swing Goal status: IN PROGRESS  Patient to demonstrate 5xSTS test in <15 sec in order to decrease risk of falls.   Baseline: 18 sec Goal status: IN PROGRESS  Patient to report participation in weekly exercise class. Baseline: Not yet initiated  Goal status: IN PROGRESS   ASSESSMENT:  CLINICAL IMPRESSION: Pt presents today and reports he feels more off balance today than he has recently; he does report yesterday was a really good balance day. Skilled PT session focused on review of HEP and progression of balance and strengthening exercises. Trialed sidestepping activity both with red theraband and ankle  weights; he prefers ankle weights due to feeling safer with foot placement.  Pt needs cues with seated strengthening to slow pace and with standing strengthening for larger amplitude movement on steps.  Pt will continue to benefit from skilled PT towards goals for improved functional mobility and decreased fall risk.   OBJECTIVE IMPAIRMENTS: Abnormal gait, decreased activity tolerance, decreased balance, decreased coordination, decreased endurance, decreased ROM, decreased strength, impaired tone, and postural dysfunction.   ACTIVITY LIMITATIONS: carrying, lifting, bending, sitting, standing, squatting, stairs, transfers, bed mobility, bathing, dressing, reach over head, and hygiene/grooming  PARTICIPATION LIMITATIONS: meal prep, cleaning, laundry, community activity, yard work, and church  PERSONAL FACTORS: Age, Past/current experiences, Time since onset of injury/illness/exacerbation, and 3+ comorbidities: ADD, prostate CA, HLD, lyme disease are also affecting patient's functional outcome.   REHAB POTENTIAL: Good  CLINICAL DECISION MAKING: Evolving/moderate complexity  EVALUATION COMPLEXITY: Moderate  PLAN:  PT FREQUENCY: 1-2x/week  PT DURATION: 6 weeks  PLANNED INTERVENTIONS: 97164- PT Re-evaluation, 97750- Physical Performance Testing, 97110-Therapeutic exercises, 97530- Therapeutic activity, [id number]- Neuromuscular re-education, 97535- Self Care, 02859- Manual therapy, 97116- Gait training, [phone]- Canalith repositioning, Patient/Family education, Balance training, Stair training, Taping, Vestibular training, Cryotherapy, and Moist heat  PLAN FOR NEXT SESSION: Update HEP, hip quad/HS strengthening, balance with squatting and picking things up from floor, . EC balance, compliant surface, narrow BOS, and balance with head turns; PWR! Moves exercises in standing?   Greig Anon, PT 04/23/24 4:19 PM Phone: [phone] Fax: [phone]   [facility]patient Rehab at [facility] [address] [place], [address] [place], [place] [address] Phone # [phone] Fax # [phone] '

## 2024-04-25 ENCOUNTER — Encounter: Payer: Self-pay | Admitting: Physical Therapy

## 2024-04-25 ENCOUNTER — Ambulatory Visit: Admitting: Occupational Therapy

## 2024-04-25 ENCOUNTER — Ambulatory Visit: Admitting: Physical Therapy

## 2024-04-25 ENCOUNTER — Other Ambulatory Visit: Payer: Self-pay

## 2024-04-25 DIAGNOSIS — R278 Other lack of coordination: Secondary | ICD-10-CM

## 2024-04-25 DIAGNOSIS — G20A1 Parkinson's disease without dyskinesia, without mention of fluctuations: Secondary | ICD-10-CM | POA: Diagnosis not present

## 2024-04-25 DIAGNOSIS — R2681 Unsteadiness on feet: Secondary | ICD-10-CM

## 2024-04-25 DIAGNOSIS — R29818 Other symptoms and signs involving the nervous system: Secondary | ICD-10-CM

## 2024-04-25 DIAGNOSIS — M6281 Muscle weakness (generalized): Secondary | ICD-10-CM

## 2024-04-25 DIAGNOSIS — R2689 Other abnormalities of gait and mobility: Secondary | ICD-10-CM | POA: Diagnosis not present

## 2024-04-25 NOTE — Therapy (Signed)
 OUTPATIENT OCCUPATIONAL THERAPY [facility] EVALUATION  Patient Name: Peter Novak MRN: [id number] DOB:[birth year], 73 y.o., male Today's Date: 04/25/2024  PCP: Katrinka Garnette KIDD, MD REFERRING PROVIDER: Evonnie Asberry RAMAN, DO  END OF SESSION:  OT End of Session - 04/25/24 0955     Visit Number 1    Number of Visits 8    Date for OT Re-Evaluation 06/07/24    Authorization Type [employer]    Authorization Time Period auth submitted    OT Start Time [phone]    OT Stop Time 0933    OT Time Calculation (min) 42 min          Past Medical History:  Diagnosis Date   ADD (attention deficit disorder with hyperactivity)    Allergy    seasonal   Arthritis    Cancer (HCC)    prostate cancer   Cataract    bilateral,removed   Complication of anesthesia    Constipation    GERD (gastroesophageal reflux disease)    GERD (gastroesophageal reflux disease)    Hearing loss    History of skin cancer    Melanoma X 2   Hyperlipidemia    Lyme disease 01/2021   Prostate cancer (HCC) 2016   Past Surgical History:  Procedure Laterality Date   CATARACT EXTRACTION, BILATERAL  March 2022 and April 2022   CHOLECYSTECTOMY  2005   COLONOSCOPY  2008   negative   ELBOW SURGERY Right 1994   ulnar transposition   EYE SURGERY  2021   cataract   HERNIA REPAIR  2005   INNER EAR SURGERY  2006   Incus transposition;oscillculoplasty; Dr Thaddeus GRIST EAR SURGERY  2011   Stirrup resection   LYMPHADENECTOMY Bilateral 11/04/2021   Procedure: LYMPHADENECTOMY, PELVIC;  Surgeon: Renda Glance, MD;  Location: [facility];  Service: Urology;  Laterality: Bilateral;   POLYPECTOMY     ROBOT ASSISTED LAPAROSCOPIC RADICAL PROSTATECTOMY N/A 11/04/2021   Procedure: XI ROBOTIC ASSISTED LAPAROSCOPIC RADICAL PROSTATECTOMY LEVEL 3;  Surgeon: Renda Glance, MD;  Location: [facility];  Service: Urology;  Laterality: N/A;   SHOULDER ARTHROSCOPY Left 2004   Dr Liam   TONSILLECTOMY AND ADENOIDECTOMY     VASECTOMY      Patient Active Problem List   Diagnosis Date Noted   Parkinson's disease without dyskinesia or fluctuating manifestations (HCC) 04/08/2024   Fatigue 01/16/2024   Excessive daytime sleepiness 01/16/2024   Genetic testing 09/21/2021   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   Bradycardia 11/05/2019   Recurrent episodes Dizziness/Ataxia 11/05/2019   White matter abnormality on MRI of brain 04/16/2019   Migraine with aura and with status migrainosus, not intractable 04/16/2019   Ocular migraine 04/16/2019   Migraine aura without headache (migraine equivalents) 04/16/2019   ADD (attention deficit disorder) 10/02/2014   Insomnia 10/02/2014   Erectile dysfunction 10/02/2014   Osteoarthritis 10/02/2014   Eustachian tube dysfunction 10/02/2014   GERD (gastroesophageal reflux disease) 02/12/2013   Prostate cancer (HCC) 02/12/2013   Benign neoplasm of adrenal gland 11/30/2009   Hyperglycemia 11/30/2009   History of melanoma 11/30/2009   HYPERLIPIDEMIA 07/10/2007    ONSET DATE: referral date 04/16/24  REFERRING DIAG: R68.89 (ICD-10-CM) - Difficulty writing  THERAPY DIAG:  Other symptoms and signs involving the nervous system  Other lack of coordination  Muscle weakness (generalized)  Unsteadiness on feet  Rationale for Evaluation and Treatment: Rehabilitation  SUBJECTIVE:   SUBJECTIVE STATEMENT: Pt reports that he is good for 5 mins or so when he is typing  and then notices that his fingers don't move as well after that.  Pt reports that he is also having difficulty with handwriting and small items such as buttons.  He states that he would walk 10-12 miles a week up until the first of April.  Pt reports noticing increased fatigue about a year prior to diagnosis and states that he has roller coaster days. Pt accompanied by: self  PERTINENT HISTORY: ADD, prostate CA, HLD, lyme disease   PRECAUTIONS: None  WEIGHT BEARING RESTRICTIONS: No  PAIN:  Are you having pain?  No  FALLS: Has patient fallen in last 6 months? No  LIVING ENVIRONMENT: Lives with: lives with their spouse Lives in: House/apartment Stairs: 1 step to enter; 1 story home Has following equipment at home: Tour manager, and Grab bars  PLOF: Independent, Vocation/Vocational requirements: retired, and Leisure: reading, outdoors, walking a mile after supper  PATIENT GOALS: see if I can get better/extend working time on [employer]  OBJECTIVE:  Note: Objective measures were completed at Evaluation unless otherwise noted.  HAND DOMINANCE: Right  ADLs: Overall ADLs: it takes longer Transfers/ambulation related to ADLs: no AD Eating: difficulty with opening small cap on square milk cartons Grooming: Mod I, increased time when shaving UB Dressing: buttons can be difficult LB Dressing: occasional difficulty getting pants Toileting: Mod I Bathing: Mod I Tub Shower transfers: Mod I Equipment: Walk in shower and built in bench in shower  IADLs: Light housekeeping: Mod I, still cutting grass Meal Prep: Mod I Community mobility: Mod I Medication management: Mod I Financial management: Mod I Handwriting: 100% legible, Mild micrographia, and 12.60 sec (whales live in a blue ocean.)  MOBILITY STATUS: Independent  POSTURE COMMENTS:  rounded shoulders, forward head, and weight shift left  ACTIVITY TOLERANCE: Activity tolerance: diminished  FUNCTIONAL OUTCOME MEASURES: Fastening/unfastening 3 buttons: 23.53 sec Physical performance test: PPT#2 (simulated eating) 11.56 & PPT#4 (donning/doffing jacket): TBD  COORDINATION: 9 Hole Peg test: Right: 29.25 sec; Left: 28.69 sec Box and Blocks:  Right 49 blocks, Left 49 blocks Alternating pronation/supination: slightly slowed B Alternating toe tap: hypokinesia L LE Finger to nose: WNL B  UE ROM:  WFL  UE MMT:   WFL Grip strength: Right: 50 and 54# and Left: 60 and 65#  SENSATION: Pt denies N/T in UEs/LEs  COGNITION: Overall cognitive  status: Within functional limits for tasks assessed  OBSERVATIONS: Bradykinesia                                                                                                                    TREATMENT DATE:  04/25/24  Engaged in discussion of progression of PD and purpose of OT in decreasing impact of PD on ADLs and coordination tasks as well as providing adaptive techniques and strategies to increase quality of movements.   PATIENT EDUCATION: Education details: Educated on role and purpose of OT as well as potential interventions and goals for therapy based on initial evaluation findings. Person educated: Patient Education method: Explanation Education comprehension: verbalized understanding  and needs further education  HOME EXERCISE PROGRAM: TBD  GOALS: Goals reviewed with patient? Yes  SHORT TERM GOALS: Target date: 05/17/24  Pt will be Independent in PD specific HEP. Baseline: new PD diagnosis Goal status: INITIAL  2.  Pt will verbalize understanding of adapted strategies to maximize safety and Independence with ADLs/ IADLs . Baseline: difficulty with buttons, putting on pants, opening caps and bottles, wiping down shower Goal status: INITIAL  3.  Pt will verbalize and demonstrate improved quality of handwriting by writing a short paragraph with 100% legibility and no significant decrease in letter size. Baseline: mild micrographia Goal status: INITIAL  4.  Pt will verbalize increased tolerance to typing and use of adaptive strategies PRN to increase success with typing longer passages. Baseline: hand fatigue after ~5 mins of typing Goal status: INITIAL   LONG TERM GOALS: Target date: 06/07/24  Pt will demonstrate improved fine motor coordination for ADLs as evidenced by decreasing 9 hole peg test score for BUE by 3 secs Baseline: Right: 29.25 sec; Left: 28.69 sec Goal status: INITIAL  2.  Pt will demonstrate improved UE functional use for ADLs as evidenced by  increasing box/ blocks score by 5 blocks with RUE/LUE Baseline: R: 49 and L: 49 blocks Goal status: INITIAL  3.  Pt will demonstrate improved ease with fastening buttons as evidenced by decreasing 3 button/ unbutton time to 20 secs or less Baseline: 23.53 sec Goal status: INITIAL  4.  Pt will verbalize understanding of ways to prevent future PD related complications and PD community resources. Baseline: new PD diagnosis Goal status: INITIAL   ASSESSMENT:  CLINICAL IMPRESSION: Patient is a 73 y.o. male who was seen today for occupational therapy evaluation for impairments in dexterity with handwriting/typing and ADLs due to PD.  Pt with recent diagnosis of PD, demonstrating mild slowing on above objective measurements and reports of decreased endurance and quality of movement with coordination and dexterity.  Pt will benefit from skilled occupational therapy services to address strength and coordination, balance, GM/FM control,introduction of compensatory strategies/AE prn, and implementation of an HEP to improve participation and safety during ADLs, IADLs, and quality of life.    PERFORMANCE DEFICITS: in functional skills including ADLs, IADLs, coordination, strength, Fine motor control, Gross motor control, body mechanics, endurance, decreased knowledge of precautions, and UE functional use and psychosocial skills including coping strategies and routines and behaviors.   IMPAIRMENTS: are limiting patient from ADLs, IADLs, and leisure.   COMORBIDITIES:  may have co-morbidities  that affects occupational performance. Patient will benefit from skilled OT to address above impairments and improve overall function.  MODIFICATION OR ASSISTANCE TO COMPLETE EVALUATION: No modification of tasks or assist necessary to complete an evaluation.  OT OCCUPATIONAL PROFILE AND HISTORY: Problem focused assessment: Including review of records relating to presenting problem.  CLINICAL DECISION MAKING: LOW -  limited treatment options, no task modification necessary  REHAB POTENTIAL: Good  EVALUATION COMPLEXITY: Low    PLAN:  OT FREQUENCY: 1-2x/week  OT DURATION: 6 weeks  PLANNED INTERVENTIONS: 97168 OT Re-evaluation, 97535 self care/ADL training, 02889 therapeutic exercise, 97530 therapeutic activity, 97112 neuromuscular re-education, 97750 Physical Performance Testing, functional mobility training, psychosocial skills training, energy conservation, coping strategies training, patient/family education, and DME and/or AE instructions  RECOMMENDED OTHER SERVICES: SLP eval  CONSULTED AND AGREED WITH PLAN OF CARE: Patient  PLAN FOR NEXT SESSION: initiate large amplitude HEP and coordination HEP, UB ROM to simulate wiping down shower/mirror, educate on community PD resources  Referring  diagnosis? R68.89 (ICD-10-CM) - Difficulty writing Treatment diagnosis? (if different than referring diagnosis)  Other symptoms and signs involving the nervous system  Other lack of coordination  Muscle weakness (generalized)  Unsteadiness on feet What was this (referring dx) caused by? []  Surgery []  Fall [x]  Ongoing issue []  Arthritis []  Other: ____________  Laterality: []  Rt []  Lt [x]  Both  Check all possible CPT codes:  *CHOOSE 10 OR LESS*    See Planned Interventions listed in the Plan section of the Evaluation.     KAYLENE DOMINO, OTR/L 04/25/2024, 9:56 AM   [facility] at [facility] [address] [place], [address] [place], [place] [address] Phone # [phone] Fax # [phone]

## 2024-04-25 NOTE — Therapy (Signed)
 OUTPATIENT PHYSICAL THERAPY NEURO TREATMENT   Patient Name: Peter Novak MRN: [id number] DOB:[birth year], 73 y.o., male Today's Date: 04/25/2024   PCP: Katrinka Garnette KIDD, MD  REFERRING PROVIDER: Evonnie Asberry RAMAN, DO  END OF SESSION:  PT End of Session - 04/25/24 0859     Visit Number 4    Number of Visits 13    Date for PT Re-Evaluation 05/27/24    Authorization Type [employer]    Authorization Time Period approved 8 visits from 04/15/2024 - 05/27/2024    Authorization - Visit Number 4    Authorization - Number of Visits 8    PT Start Time 0933    PT Stop Time 1012    PT Time Calculation (min) 39 min    Activity Tolerance Patient tolerated treatment well    Behavior During Therapy [facility] for tasks assessed/performed            Past Medical History:  Diagnosis Date   ADD (attention deficit disorder with hyperactivity)    Allergy    seasonal   Arthritis    Cancer (HCC)    prostate cancer   Cataract    bilateral,removed   Complication of anesthesia    Constipation    GERD (gastroesophageal reflux disease)    GERD (gastroesophageal reflux disease)    Hearing loss    History of skin cancer    Melanoma X 2   Hyperlipidemia    Lyme disease 01/2021   Prostate cancer (HCC) 2016   Past Surgical History:  Procedure Laterality Date   CATARACT EXTRACTION, BILATERAL  March 2022 and April 2022   CHOLECYSTECTOMY  2005   COLONOSCOPY  2008   negative   ELBOW SURGERY Right 1994   ulnar transposition   EYE SURGERY  2021   cataract   HERNIA REPAIR  2005   INNER EAR SURGERY  2006   Incus transposition;oscillculoplasty; Dr Thaddeus GRIST EAR SURGERY  2011   Stirrup resection   LYMPHADENECTOMY Bilateral 11/04/2021   Procedure: LYMPHADENECTOMY, PELVIC;  Surgeon: Renda Glance, MD;  Location: [facility];  Service: Urology;  Laterality: Bilateral;   POLYPECTOMY     ROBOT ASSISTED LAPAROSCOPIC RADICAL PROSTATECTOMY N/A 11/04/2021   Procedure: XI ROBOTIC ASSISTED  LAPAROSCOPIC RADICAL PROSTATECTOMY LEVEL 3;  Surgeon: Renda Glance, MD;  Location: [facility];  Service: Urology;  Laterality: N/A;   SHOULDER ARTHROSCOPY Left 2004   Dr Liam   TONSILLECTOMY AND ADENOIDECTOMY     VASECTOMY     Patient Active Problem List   Diagnosis Date Noted   Parkinson's disease without dyskinesia or fluctuating manifestations (HCC) 04/08/2024   Fatigue 01/16/2024   Excessive daytime sleepiness 01/16/2024   Genetic testing 09/21/2021   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   Bradycardia 11/05/2019   Recurrent episodes Dizziness/Ataxia 11/05/2019   White matter abnormality on MRI of brain 04/16/2019   Migraine with aura and with status migrainosus, not intractable 04/16/2019   Ocular migraine 04/16/2019   Migraine aura without headache (migraine equivalents) 04/16/2019   ADD (attention deficit disorder) 10/02/2014   Insomnia 10/02/2014   Erectile dysfunction 10/02/2014   Osteoarthritis 10/02/2014   Eustachian tube dysfunction 10/02/2014   GERD (gastroesophageal reflux disease) 02/12/2013   Prostate cancer (HCC) 02/12/2013   Benign neoplasm of adrenal gland 11/30/2009   Hyperglycemia 11/30/2009   History of melanoma 11/30/2009   HYPERLIPIDEMIA 07/10/2007    ONSET DATE: April 2025   REFERRING DIAG: G20.A1 (ICD-10-CM) - Parkinson's disease without dyskinesia or fluctuating manifestations (HCC)  THERAPY DIAG:  Unsteadiness on feet  Muscle weakness (generalized)  Other abnormalities of gait and mobility  Rationale for Evaluation and Treatment: Rehabilitation  SUBJECTIVE:                                                                                                                                                                                             SUBJECTIVE STATEMENT: Balance pretty much the same.     Pt accompanied by: self  PERTINENT HISTORY: ADD, prostate CA, HLD, lyme disease  PAIN:  Are you having pain? No  PRECAUTIONS:  None  RED FLAGS: None   WEIGHT BEARING RESTRICTIONS: No  FALLS: Has patient fallen in last 6 months? No  LIVING ENVIRONMENT: Lives with: lives with their spouse Lives in: House/apartment Stairs: 1 step to enter; 1 story home Has following equipment at home: Single point cane, Shower bench, and Grab bars  PLOF: Independent, Vocation/Vocational requirements: retired, and Leisure: reading, outdoors, walking a mile after supper  PATIENT GOALS: I don't have any idea  OBJECTIVE:    TODAY'S TREATMENT: 04/25/2024 Activity Comments  PWR! Moves in standing: PWR! Up x 10 PWR! Rock x 10 PWR! Twist x 10 PWR! Step x 10 Feels slight lightheadedness PT provides visual and verbal cues  Vitals 117/72, HR 52 bpm Vitals 98/67 HR 63 bpm Seated standing  Gait training to work on large amplitude, coordinated reciprocal arm swing -initially started with bilat walking poles to facilitate increased reciprocal arm swing -took away poles and provided cues for BIG steps, relaxed arm swing, then go BIG with arm swing Does some same side arm/leg movements and responds well to BIG steps and relaxed arm swing               HOME EXERCISE PROGRAM Access Code: [email or web address] URL: [email or web address] Date: 04/17/2024>04/24/2024  Prepared by: [facility] - Outpatient  Rehab - [facility]  Program Notes perform balance exercises in a corner for safety ADDED PWR! Moves in standing 10-20 reps daily  Exercises - Single Leg Heel Raise with Unilateral Counter Support  - 1 x daily - 5 x weekly - 2 sets - 10 reps - Squat with Chair Touch  - 1 x daily - 5 x weekly - 2 sets - 10 reps - Side Stepping with Resistance at Ankles and Counter Support  - 1 x daily - 5 x weekly - 2 sets - 1 min hold - Romberg Stance with Eyes Closed  - 1 x daily - 5 x weekly - 2-3 sets - 30 sec hold - Romberg Stance with Head Rotation  - 1 x daily - 5 x  weekly - 2-3 sets - 30 sec hold   PATIENT  EDUCATION: Education details: Answered pt's questions on bradykinesia/slowed/less coordinated movements; introduced PWR! Moves and instructions for walking:  FOCUS ON BIG steps, relaxed (reciprocal arm swing) and then go BIG on the arms Person educated: Patient Education method: Explanation, Demonstration, Tactile cues, Verbal cues, and Handouts Education comprehension: verbalized understanding and returned demonstration     Note: Objective measures were completed at Evaluation unless otherwise noted.  DIAGNOSTIC FINDINGS: 12/13/23 brain MRI: T2 FLAIR hyperintense signal changes within the cerebral white matter (moderate) and pons (mild), similar to the prior brain MRI of 05/10/2022. Findings are nonspecific and differential considerations include chronic small vessel ischemic disease, sequelae of a prior infectious/inflammatory process and sequelae of demyelinating disease, among others.   COGNITION: Overall cognitive status: Within functional limits for tasks assessed   SENSATION: Pt denies N/T in UEs/LEs  COORDINATION: Alternating pronation/supination: slightly slowed B Alternating toe tap: hypokinesia L LE Finger to nose: WNL B   MUSCLE TONE: rigidity in L LE, especially in ankle   POSTURE: rounded shoulders, forward head, and weight shift left  LOWER EXTREMITY ROM:     Active  Right Eval Left Eval  Hip flexion    Hip extension    Hip abduction    Hip adduction    Hip internal rotation    Hip external rotation    Knee flexion    Knee extension    Ankle dorsiflexion 10 3  Ankle plantarflexion    Ankle inversion    Ankle eversion     (Blank rows = not tested)  LOWER EXTREMITY MMT:    MMT (in sitting) Right Eval Left Eval  Hip flexion 4+ 4+  Hip extension    Hip abduction 3+ 3+  Hip adduction 4 4  Hip internal rotation    Hip external rotation    Knee flexion 4+ 4  Knee extension 4 4  Ankle dorsiflexion 5 5  Ankle plantarflexion 5 5  Ankle  inversion    Ankle eversion    (Blank rows = not tested)  GAIT: Findings: Assistive device utilized:None, Level of assistance: Complete Independence, and Comments: reduced R arm swing  FUNCTIONAL TESTS:  5 times sit to stand: 18.42 sec without UEs 10 meter walk test: 9.21 sec  without AD  (3.56 ft/sec)                                                                                                                              TREATMENT DATE: 04/15/24     PATIENT EDUCATION: Education details: edu on benefits of OT for hand dexterity- pt agreeable, prognosis, POC, exam findings, edu and handout on PD community resources  Education method: Explanation, Demonstration, Tactile cues, Verbal cues, and Handouts Education comprehension: verbalized understanding  HOME EXERCISE PROGRAM: Not yet initiated   GOALS: Goals reviewed with patient? Yes  SHORT TERM GOALS: Target date: 05/06/2024  Patient to be independent with  initial HEP. Baseline: HEP initiated Goal status: IN PROGRESS    LONG TERM GOALS: Target date: 05/27/2024  Patient to be independent with advanced HEP. Baseline: Not yet initiated  Goal status: IN PROGRESS  Patient to verbalize understanding of local Parkinson's Disease community resources including community fitness post D/C.   Baseline: Not yet initiated  Goal status: IN PROGRESS  Patient to improve MiniBestTest score to atleast 17-21 to decrease risk of falls.  Baseline: 19 04/17/24  Goal status: IN PROGRESS 04/17/24  Patient to demonstrate 50% improvement in R UE swing during gait.  Baseline: no UE swing Goal status: IN PROGRESS  Patient to demonstrate 5xSTS test in <15 sec in order to decrease risk of falls.   Baseline: 18 sec Goal status: IN PROGRESS  Patient to report participation in weekly exercise class. Baseline: Not yet initiated  Goal status: IN PROGRESS   ASSESSMENT:  CLINICAL IMPRESSION: Pt presents today with good questions about small  movement patterns, forward posture, and wanting to work on arm with with gait.  Initiated PWR! Moves in standing and provided rationale for them in relation to pt's symptoms and functional activities.  Also address decreased arm swing with gait activities, with pt doing best with cues for larger step length to allow for relaxed reciprocal arm swing.  He does well with PWR! Moves and attention to large amplitude movement patterns.  He will continue to benefit from skilled PT towards goals for improved functional mobility and decreased fall risk.    OBJECTIVE IMPAIRMENTS: Abnormal gait, decreased activity tolerance, decreased balance, decreased coordination, decreased endurance, decreased ROM, decreased strength, impaired tone, and postural dysfunction.   ACTIVITY LIMITATIONS: carrying, lifting, bending, sitting, standing, squatting, stairs, transfers, bed mobility, bathing, dressing, reach over head, and hygiene/grooming  PARTICIPATION LIMITATIONS: meal prep, cleaning, laundry, community activity, yard work, and church  PERSONAL FACTORS: Age, Past/current experiences, Time since onset of injury/illness/exacerbation, and 3+ comorbidities: ADD, prostate CA, HLD, lyme disease are also affecting patient's functional outcome.   REHAB POTENTIAL: Good  CLINICAL DECISION MAKING: Evolving/moderate complexity  EVALUATION COMPLEXITY: Moderate  PLAN:  PT FREQUENCY: 1-2x/week  PT DURATION: 6 weeks  PLANNED INTERVENTIONS: 97164- PT Re-evaluation, 97750- Physical Performance Testing, 97110-Therapeutic exercises, 97530- Therapeutic activity, [id number]- Neuromuscular re-education, 97535- Self Care, 02859- Manual therapy, [id number]- Gait training, [phone]- Canalith repositioning, Patient/Family education, Balance training, Stair training, Taping, Vestibular training, Cryotherapy, and Moist heat  PLAN FOR NEXT SESSION: Review PWR! Moves and progress (maybe work on forward/back step and recover); continue gait training  with arm swing.  Continue hip quad/HS strengthening, balance with squatting and picking things up from floor, . EC balance, compliant surface, narrow BOS, and balance with head turns   Greig Anon, PT 04/25/24 10:15 AM Phone: [phone] Fax: [phone]   [facility] at [facility] [address] [place], [address] [place], [place] [address] Phone # [phone] Fax # [phone] '

## 2024-04-30 ENCOUNTER — Encounter: Payer: Self-pay | Admitting: Physical Therapy

## 2024-04-30 ENCOUNTER — Ambulatory Visit: Attending: Neurology | Admitting: Physical Therapy

## 2024-04-30 ENCOUNTER — Encounter: Payer: Self-pay | Admitting: Neurology

## 2024-04-30 DIAGNOSIS — R278 Other lack of coordination: Secondary | ICD-10-CM | POA: Diagnosis not present

## 2024-04-30 DIAGNOSIS — M6281 Muscle weakness (generalized): Secondary | ICD-10-CM | POA: Diagnosis not present

## 2024-04-30 DIAGNOSIS — R2689 Other abnormalities of gait and mobility: Secondary | ICD-10-CM | POA: Diagnosis not present

## 2024-04-30 DIAGNOSIS — R471 Dysarthria and anarthria: Secondary | ICD-10-CM | POA: Diagnosis not present

## 2024-04-30 DIAGNOSIS — R29818 Other symptoms and signs involving the nervous system: Secondary | ICD-10-CM | POA: Diagnosis not present

## 2024-04-30 DIAGNOSIS — R2681 Unsteadiness on feet: Secondary | ICD-10-CM | POA: Diagnosis not present

## 2024-04-30 NOTE — Therapy (Signed)
 OUTPATIENT PHYSICAL THERAPY NEURO TREATMENT   Patient Name: Peter Novak MRN: [id number] DOB:11-Feb-1951, 73 y.o., male Today's Date: 04/30/2024   PCP: Katrinka Garnette KIDD, MD  REFERRING PROVIDER: Evonnie Asberry RAMAN, DO  END OF SESSION:  PT End of Session - 04/30/24 0934     Visit Number 5    Number of Visits 13    Date for PT Re-Evaluation 05/27/24    Authorization Type [employer]    Authorization Time Period approved 8 visits from 04/15/2024 - 05/27/2024    Authorization - Visit Number 5    Authorization - Number of Visits 8    PT Start Time 0934    PT Stop Time 1012    PT Time Calculation (min) 38 min    Activity Tolerance Patient tolerated treatment well    Behavior During Therapy [facility] for tasks assessed/performed             Past Medical History:  Diagnosis Date   ADD (attention deficit disorder with hyperactivity)    Allergy    seasonal   Arthritis    Cancer (HCC)    prostate cancer   Cataract    bilateral,removed   Complication of anesthesia    Constipation    GERD (gastroesophageal reflux disease)    GERD (gastroesophageal reflux disease)    Hearing loss    History of skin cancer    Melanoma X 2   Hyperlipidemia    Lyme disease 01/2021   Prostate cancer (HCC) 2016   Past Surgical History:  Procedure Laterality Date   CATARACT EXTRACTION, BILATERAL  March 2022 and April 2022   CHOLECYSTECTOMY  2005   COLONOSCOPY  2008   negative   ELBOW SURGERY Right 1994   ulnar transposition   EYE SURGERY  2021   cataract   HERNIA REPAIR  2005   INNER EAR SURGERY  2006   Incus transposition;oscillculoplasty; Dr Thaddeus GRIST EAR SURGERY  2011   Stirrup resection   LYMPHADENECTOMY Bilateral 11/04/2021   Procedure: LYMPHADENECTOMY, PELVIC;  Surgeon: Renda Glance, MD;  Location: [facility];  Service: Urology;  Laterality: Bilateral;   POLYPECTOMY     ROBOT ASSISTED LAPAROSCOPIC RADICAL PROSTATECTOMY N/A 11/04/2021   Procedure: XI ROBOTIC ASSISTED  LAPAROSCOPIC RADICAL PROSTATECTOMY LEVEL 3;  Surgeon: Renda Glance, MD;  Location: [facility];  Service: Urology;  Laterality: N/A;   SHOULDER ARTHROSCOPY Left 2004   Dr Liam   TONSILLECTOMY AND ADENOIDECTOMY     VASECTOMY     Patient Active Problem List   Diagnosis Date Noted   Parkinson's disease without dyskinesia or fluctuating manifestations (HCC) 04/08/2024   Fatigue 01/16/2024   Excessive daytime sleepiness 01/16/2024   Genetic testing 09/21/2021   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   Bradycardia 11/05/2019   Recurrent episodes Dizziness/Ataxia 11/05/2019   White matter abnormality on MRI of brain 04/16/2019   Migraine with aura and with status migrainosus, not intractable 04/16/2019   Ocular migraine 04/16/2019   Migraine aura without headache (migraine equivalents) 04/16/2019   ADD (attention deficit disorder) 10/02/2014   Insomnia 10/02/2014   Erectile dysfunction 10/02/2014   Osteoarthritis 10/02/2014   Eustachian tube dysfunction 10/02/2014   GERD (gastroesophageal reflux disease) 02/12/2013   Prostate cancer (HCC) 02/12/2013   Benign neoplasm of adrenal gland 11/30/2009   Hyperglycemia 11/30/2009   History of melanoma 11/30/2009   HYPERLIPIDEMIA 07/10/2007    ONSET DATE: April 2025   REFERRING DIAG: G20.A1 (ICD-10-CM) - Parkinson's disease without dyskinesia or fluctuating manifestations (HCC)  THERAPY DIAG:  Other symptoms and signs involving the nervous system  Other lack of coordination  Muscle weakness (generalized)  Unsteadiness on feet  Other abnormalities of gait and mobility  Rationale for Evaluation and Treatment: Rehabilitation  SUBJECTIVE:                                                                                                                                                                                             SUBJECTIVE STATEMENT: Pt reports no new complaints. States he feels a little lightheaded when he first came  through the door. Pt states he did increase his Parkinson's meds on Friday -- full tab in morning and at noon; however, was able to walk a mile during the weekend without issues. .    Pt accompanied by: self  PERTINENT HISTORY: ADD, prostate CA, HLD, lyme disease  PAIN:  Are you having pain? No  PRECAUTIONS: None  RED FLAGS: None   WEIGHT BEARING RESTRICTIONS: No  FALLS: Has patient fallen in last 6 months? No  LIVING ENVIRONMENT: Lives with: lives with their spouse Lives in: House/apartment Stairs: 1 step to enter; 1 story home Has following equipment at home: Single point cane, Shower bench, and Grab bars  PLOF: Independent, Vocation/Vocational requirements: retired, and Leisure: reading, outdoors, walking a mile after supper  PATIENT GOALS: I don't have any idea  OBJECTIVE:   TODAY'S TREATMENT: 04/30/2024 Activity Comments  Nustep L6 x 6 min UEs/LEs   Vitals 103/57, HR 78 bpm Sitting after Nustep  Attempted standing PWR! Moves but pt felt dizzy Vitals 88/54, HR 68 bpm   Seated PWR! Moves Prepare: Up x10 Rock x10 Twist x10 Step x10 Seated PWR! Moves activate all in sequence x10 Vitals s/p seated PWR moves 93/58, HR 65    Vitals s/p seated PWR moves 98/64, HR 64  Vitals 78/58, HR 75 bpm Standing  Vitals 139/75, HR 51 bpm supine 100/67, HR 63 bpm sitting 83/57, HR 65 BPM standing but didn't c/o lightheadedness After laying supine x 10 min            HOME EXERCISE PROGRAM Access Code: [email or web address] URL: [email or web address] Date: 04/17/2024>04/24/2024  Prepared by: [facility] - Outpatient  Rehab - [facility]  Program Notes perform balance exercises in a corner for safety ADDED PWR! Moves in standing 10-20 reps daily  Exercises - Single Leg Heel Raise with Unilateral Counter Support  - 1 x daily - 5 x weekly - 2 sets - 10 reps - Squat with Chair Touch  - 1 x daily - 5 x weekly - 2 sets - 10 reps - Side Stepping with  Resistance at  Ankles and Counter Support  - 1 x daily - 5 x weekly - 2 sets - 1 min hold - Romberg Stance with Eyes Closed  - 1 x daily - 5 x weekly - 2-3 sets - 30 sec hold - Romberg Stance with Head Rotation  - 1 x daily - 5 x weekly - 2-3 sets - 30 sec hold   PATIENT EDUCATION: Education details: Answered pt's questions on bradykinesia/slowed/less coordinated movements; introduced PWR! Moves and instructions for walking:  FOCUS ON BIG steps, relaxed (reciprocal arm swing) and then go BIG on the arms Person educated: Patient Education method: Explanation, Demonstration, Tactile cues, Verbal cues, and Handouts Education comprehension: verbalized understanding and returned demonstration     Note: Objective measures were completed at Evaluation unless otherwise noted.  DIAGNOSTIC FINDINGS: 12/13/23 brain MRI: T2 FLAIR hyperintense signal changes within the cerebral white matter (moderate) and pons (mild), similar to the prior brain MRI of 05/10/2022. Findings are nonspecific and differential considerations include chronic small vessel ischemic disease, sequelae of a prior infectious/inflammatory process and sequelae of demyelinating disease, among others.   COGNITION: Overall cognitive status: Within functional limits for tasks assessed   SENSATION: Pt denies N/T in UEs/LEs  COORDINATION: Alternating pronation/supination: slightly slowed B Alternating toe tap: hypokinesia L LE Finger to nose: WNL B   MUSCLE TONE: rigidity in L LE, especially in ankle   POSTURE: rounded shoulders, forward head, and weight shift left  LOWER EXTREMITY ROM:     Active  Right Eval Left Eval  Hip flexion    Hip extension    Hip abduction    Hip adduction    Hip internal rotation    Hip external rotation    Knee flexion    Knee extension    Ankle dorsiflexion 10 3  Ankle plantarflexion    Ankle inversion    Ankle eversion     (Blank rows = not tested)  LOWER EXTREMITY MMT:    MMT (in sitting)  Right Eval Left Eval  Hip flexion 4+ 4+  Hip extension    Hip abduction 3+ 3+  Hip adduction 4 4  Hip internal rotation    Hip external rotation    Knee flexion 4+ 4  Knee extension 4 4  Ankle dorsiflexion 5 5  Ankle plantarflexion 5 5  Ankle inversion    Ankle eversion    (Blank rows = not tested)  GAIT: Findings: Assistive device utilized:None, Level of assistance: Complete Independence, and Comments: reduced R arm swing  FUNCTIONAL TESTS:  5 times sit to stand: 18.42 sec without UEs 10 meter walk test: 9.21 sec  without AD  (3.56 ft/sec)                                                                                                                              TREATMENT DATE: 04/15/24     PATIENT EDUCATION: Education details: orthostatic hypotension -- monitoring, management and  to get medical management Education method: Explanation, Demonstration, Tactile cues, Verbal cues, and Handouts Education comprehension: verbalized understanding  HOME EXERCISE PROGRAM: Not yet initiated   GOALS: Goals reviewed with patient? Yes  SHORT TERM GOALS: Target date: 05/06/2024  Patient to be independent with initial HEP. Baseline: HEP initiated Goal status: IN PROGRESS    LONG TERM GOALS: Target date: 05/27/2024  Patient to be independent with advanced HEP. Baseline: Not yet initiated  Goal status: IN PROGRESS  Patient to verbalize understanding of local Parkinson's Disease community resources including community fitness post D/C.   Baseline: Not yet initiated  Goal status: IN PROGRESS  Patient to improve MiniBestTest score to atleast 17-21 to decrease risk of falls.  Baseline: 19 04/17/24  Goal status: IN PROGRESS 04/17/24  Patient to demonstrate 50% improvement in R UE swing during gait.  Baseline: no UE swing Goal status: IN PROGRESS  Patient to demonstrate 5xSTS test in <15 sec in order to decrease risk of falls.   Baseline: 18 sec Goal status: IN  PROGRESS  Patient to report participation in weekly exercise class. Baseline: Not yet initiated  Goal status: IN PROGRESS   ASSESSMENT:  CLINICAL IMPRESSION: Pt with reports of lightheadedness with standing today. Pt demos some orthostatic hypotension. Performed all exercises in seated today with very minimal increase in his BP. Discussed with pt to monitor his BP at home and then likely call Dr. Evonnie. Pt has had no other changes except increase in his Parkinson's med on Friday but he was not symptomatic throughout the weekend. Discussed keeping up with his fluid intake and eating prior to his exercises.   OBJECTIVE IMPAIRMENTS: Abnormal gait, decreased activity tolerance, decreased balance, decreased coordination, decreased endurance, decreased ROM, decreased strength, impaired tone, and postural dysfunction.   ACTIVITY LIMITATIONS: carrying, lifting, bending, sitting, standing, squatting, stairs, transfers, bed mobility, bathing, dressing, reach over head, and hygiene/grooming  PARTICIPATION LIMITATIONS: meal prep, cleaning, laundry, community activity, yard work, and church  PERSONAL FACTORS: Age, Past/current experiences, Time since onset of injury/illness/exacerbation, and 3+ comorbidities: ADD, prostate CA, HLD, lyme disease are also affecting patient's functional outcome.   REHAB POTENTIAL: Good  CLINICAL DECISION MAKING: Evolving/moderate complexity  EVALUATION COMPLEXITY: Moderate  PLAN:  PT FREQUENCY: 1-2x/week  PT DURATION: 6 weeks  PLANNED INTERVENTIONS: 97164- PT Re-evaluation, 97750- Physical Performance Testing, 97110-Therapeutic exercises, 97530- Therapeutic activity, [id number]- Neuromuscular re-education, 97535- Self Care, [id number]- Manual therapy, [id number]- Gait training, [phone]- Canalith repositioning, Patient/Family education, Balance training, Stair training, Taping, Vestibular training, Cryotherapy, and Moist heat  PLAN FOR NEXT SESSION: Review PWR! Moves and progress  (maybe work on forward/back step and recover); continue gait training with arm swing.  Continue hip quad/HS strengthening, balance with squatting and picking things up from floor, . EC balance, compliant surface, narrow BOS, and balance with head turns   [facility] April Honor LITTIE Starring, PT, DPT 04/30/24 9:34 AM Phone: [phone] Fax: [phone]   [facility] Re[facility] at [facility] [address] [place], [address] [place], [place] [address] Phone # [phone] Fax # [phone] '

## 2024-05-02 ENCOUNTER — Encounter: Payer: Self-pay | Admitting: Physical Therapy

## 2024-05-02 ENCOUNTER — Ambulatory Visit: Admitting: Physical Therapy

## 2024-05-02 DIAGNOSIS — R2681 Unsteadiness on feet: Secondary | ICD-10-CM | POA: Diagnosis not present

## 2024-05-02 DIAGNOSIS — R2689 Other abnormalities of gait and mobility: Secondary | ICD-10-CM

## 2024-05-02 DIAGNOSIS — R278 Other lack of coordination: Secondary | ICD-10-CM | POA: Diagnosis not present

## 2024-05-02 DIAGNOSIS — R471 Dysarthria and anarthria: Secondary | ICD-10-CM | POA: Diagnosis not present

## 2024-05-02 DIAGNOSIS — R29818 Other symptoms and signs involving the nervous system: Secondary | ICD-10-CM

## 2024-05-02 DIAGNOSIS — M6281 Muscle weakness (generalized): Secondary | ICD-10-CM

## 2024-05-02 NOTE — Therapy (Signed)
 OUTPATIENT PHYSICAL THERAPY NEURO TREATMENT   Patient Name: Peter Novak MRN: [id number] DOB:[birth year], 73 y.o., male Today's Date: 05/02/2024   PCP: Katrinka Garnette KIDD, MD  REFERRING PROVIDER: Evonnie Asberry RAMAN, DO  END OF SESSION:  PT End of Session - 05/02/24 0932     Visit Number 6    Number of Visits 13    Date for PT Re-Evaluation 05/27/24    Authorization Type [employer]    Authorization Time Period approved 8 visits from 04/15/2024 - 05/27/2024    Authorization - Visit Number 6    Authorization - Number of Visits 8    PT Start Time 0932    PT Stop Time 1012    PT Time Calculation (min) 40 min    Activity Tolerance Patient tolerated treatment well    Behavior During Therapy [facility] for tasks assessed/performed              Past Medical History:  Diagnosis Date   ADD (attention deficit disorder with hyperactivity)    Allergy    seasonal   Arthritis    Cancer (HCC)    prostate cancer   Cataract    bilateral,removed   Complication of anesthesia    Constipation    GERD (gastroesophageal reflux disease)    GERD (gastroesophageal reflux disease)    Hearing loss    History of skin cancer    Melanoma X 2   Hyperlipidemia    Lyme disease 01/2021   Prostate cancer (HCC) 2016   Past Surgical History:  Procedure Laterality Date   CATARACT EXTRACTION, BILATERAL  March 2022 and April 2022   CHOLECYSTECTOMY  2005   COLONOSCOPY  2008   negative   ELBOW SURGERY Right 1994   ulnar transposition   EYE SURGERY  2021   cataract   HERNIA REPAIR  2005   INNER EAR SURGERY  2006   Incus transposition;oscillculoplasty; Dr Thaddeus GRIST EAR SURGERY  2011   Stirrup resection   LYMPHADENECTOMY Bilateral 11/04/2021   Procedure: LYMPHADENECTOMY, PELVIC;  Surgeon: Renda Glance, MD;  Location: [facility];  Service: Urology;  Laterality: Bilateral;   POLYPECTOMY     ROBOT ASSISTED LAPAROSCOPIC RADICAL PROSTATECTOMY N/A 11/04/2021   Procedure: XI ROBOTIC ASSISTED  LAPAROSCOPIC RADICAL PROSTATECTOMY LEVEL 3;  Surgeon: Renda Glance, MD;  Location: [facility];  Service: Urology;  Laterality: N/A;   SHOULDER ARTHROSCOPY Left 2004   Dr Liam   TONSILLECTOMY AND ADENOIDECTOMY     VASECTOMY     Patient Active Problem List   Diagnosis Date Noted   Parkinson's disease without dyskinesia or fluctuating manifestations (HCC) 04/08/2024   Fatigue 01/16/2024   Excessive daytime sleepiness 01/16/2024   Genetic testing 09/21/2021   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   Bradycardia 11/05/2019   Recurrent episodes Dizziness/Ataxia 11/05/2019   White matter abnormality on MRI of brain 04/16/2019   Migraine with aura and with status migrainosus, not intractable 04/16/2019   Ocular migraine 04/16/2019   Migraine aura without headache (migraine equivalents) 04/16/2019   ADD (attention deficit disorder) 10/02/2014   Insomnia 10/02/2014   Erectile dysfunction 10/02/2014   Osteoarthritis 10/02/2014   Eustachian tube dysfunction 10/02/2014   GERD (gastroesophageal reflux disease) 02/12/2013   Prostate cancer (HCC) 02/12/2013   Benign neoplasm of adrenal gland 11/30/2009   Hyperglycemia 11/30/2009   History of melanoma 11/30/2009   HYPERLIPIDEMIA 07/10/2007    ONSET DATE: April 2025   REFERRING DIAG: G20.A1 (ICD-10-CM) - Parkinson's disease without dyskinesia or fluctuating manifestations (  HCC)  THERAPY DIAG:  Other symptoms and signs involving the nervous system  Other lack of coordination  Muscle weakness (generalized)  Unsteadiness on feet  Other abnormalities of gait and mobility  Rationale for Evaluation and Treatment: Rehabilitation  SUBJECTIVE:                                                                                                                                                                                             SUBJECTIVE STATEMENT: Pt states he did inform Dr. Evonnie. No lightheadedness today.    Pt accompanied by:  self  PERTINENT HISTORY: ADD, prostate CA, HLD, lyme disease  PAIN:  Are you having pain? No  PRECAUTIONS: None  RED FLAGS: None   WEIGHT BEARING RESTRICTIONS: No  FALLS: Has patient fallen in last 6 months? No  LIVING ENVIRONMENT: Lives with: lives with their spouse Lives in: House/apartment Stairs: 1 step to enter; 1 story home Has following equipment at home: Single point cane, Tour manager, and Grab bars  PLOF: Independent, Vocation/Vocational requirements: retired, and Leisure: reading, outdoors, walking a mile after supper  PATIENT GOALS: I don't have any idea  OBJECTIVE:  TODAY'S TREATMENT: 05/02/2024 Activity Comments  Vitals 110/72, HR 61 BPM Initial vitals in sitting  Nustep L6 x 6 min UEs/LEs   Vitals 131/78, HR 60 BPM Sitting after Nustep  Standing PWR! Moves Prepare x10: up, rock, twist, side step Standing PWR!moves activate x10 in sequence   Vitals 114/75, HR 63 BPM After Standing PWR! moves  BIG walking with arm swing   Feet on airex EC 2x30          TODAY'S TREATMENT: 04/30/2024 Activity Comments  Nustep L6 x 6 min UEs/LEs   Vitals 103/57, HR 78 bpm Sitting after Nustep  Attempted standing PWR! Moves but pt felt dizzy Vitals 88/54, HR 68 bpm   Seated PWR! Moves Prepare: Up x10 Rock x10 Twist x10 Step x10 Seated PWR! Moves activate all in sequence x10 Vitals s/p seated PWR moves 93/58, HR 65    Vitals s/p seated PWR moves 98/64, HR 64  Vitals 78/58, HR 75 bpm Standing  Vitals 139/75, HR 51 bpm supine 100/67, HR 63 bpm sitting 83/57, HR 65 BPM standing but didn't c/o lightheadedness After laying supine x 10 min            HOME EXERCISE PROGRAM Access Code: [email or web address] URL: [email or web address] Date: 04/17/2024>04/24/2024  Prepared by: [facility] - Outpatient  Rehab - [facility]  Program Notes perform balance exercises in a corner for safety ADDED PWR! Moves in standing 10-20 reps daily  Exercises - Single Leg  Heel Raise with Unilateral Counter Support  - 1 x daily - 5 x weekly - 2 sets - 10 reps - Squat with Chair Touch  - 1 x daily - 5 x weekly - 2 sets - 10 reps - Side Stepping with Resistance at Ankles and Counter Support  - 1 x daily - 5 x weekly - 2 sets - 1 min hold - Romberg Stance with Eyes Closed  - 1 x daily - 5 x weekly - 2-3 sets - 30 sec hold - Romberg Stance with Head Rotation  - 1 x daily - 5 x weekly - 2-3 sets - 30 sec hold   PATIENT EDUCATION: Education details: Answered pt's questions on bradykinesia/slowed/less coordinated movements; introduced PWR! Moves and instructions for walking:  FOCUS ON BIG steps, relaxed (reciprocal arm swing) and then go BIG on the arms Person educated: Patient Education method: Explanation, Demonstration, Tactile cues, Verbal cues, and Handouts Education comprehension: verbalized understanding and returned demonstration     Note: Objective measures were completed at Evaluation unless otherwise noted.  DIAGNOSTIC FINDINGS: 12/13/23 brain MRI: T2 FLAIR hyperintense signal changes within the cerebral white matter (moderate) and pons (mild), similar to the prior brain MRI of 05/10/2022. Findings are nonspecific and differential considerations include chronic small vessel ischemic disease, sequelae of a prior infectious/inflammatory process and sequelae of demyelinating disease, among others.   COGNITION: Overall cognitive status: Within functional limits for tasks assessed   SENSATION: Pt denies N/T in UEs/LEs  COORDINATION: Alternating pronation/supination: slightly slowed B Alternating toe tap: hypokinesia L LE Finger to nose: WNL B   MUSCLE TONE: rigidity in L LE, especially in ankle   POSTURE: rounded shoulders, forward head, and weight shift left  LOWER EXTREMITY ROM:     Active  Right Eval Left Eval  Hip flexion    Hip extension    Hip abduction    Hip adduction    Hip internal rotation    Hip external rotation    Knee  flexion    Knee extension    Ankle dorsiflexion 10 3  Ankle plantarflexion    Ankle inversion    Ankle eversion     (Blank rows = not tested)  LOWER EXTREMITY MMT:    MMT (in sitting) Right Eval Left Eval  Hip flexion 4+ 4+  Hip extension    Hip abduction 3+ 3+  Hip adduction 4 4  Hip internal rotation    Hip external rotation    Knee flexion 4+ 4  Knee extension 4 4  Ankle dorsiflexion 5 5  Ankle plantarflexion 5 5  Ankle inversion    Ankle eversion    (Blank rows = not tested)  GAIT: Findings: Assistive device utilized:None, Level of assistance: Complete Independence, and Comments: reduced R arm swing  FUNCTIONAL TESTS:  5 times sit to stand: 18.42 sec without UEs 10 meter walk test: 9.21 sec  without AD  (3.56 ft/sec)  TREATMENT DATE: 04/15/24     PATIENT EDUCATION: Education details: orthostatic hypotension -- monitoring, management and to get medical management Education method: Explanation, Demonstration, Tactile cues, Verbal cues, and Handouts Education comprehension: verbalized understanding  HOME EXERCISE PROGRAM: Not yet initiated   GOALS: Goals reviewed with patient? Yes  SHORT TERM GOALS: Target date: 05/06/2024  Patient to be independent with initial HEP. Baseline: HEP initiated Goal status: IN PROGRESS    LONG TERM GOALS: Target date: 05/27/2024  Patient to be independent with advanced HEP. Baseline: Not yet initiated  Goal status: IN PROGRESS  Patient to verbalize understanding of local Parkinson's Disease community resources including community fitness post D/C.   Baseline: Not yet initiated  Goal status: IN PROGRESS  Patient to improve MiniBestTest score to atleast 17-21 to decrease risk of falls.  Baseline: 19 04/17/24  Goal status: IN PROGRESS 04/17/24  Patient to demonstrate 50% improvement in R UE swing during  gait.  Baseline: no UE swing Goal status: IN PROGRESS  Patient to demonstrate 5xSTS test in <15 sec in order to decrease risk of falls.   Baseline: 18 sec Goal status: IN PROGRESS  Patient to report participation in weekly exercise class. Baseline: Not yet initiated  Goal status: IN PROGRESS   ASSESSMENT:  CLINICAL IMPRESSION: BP better controlled today. Able to perform standing PWR! Moves. Pt with feelings of greatest difficulty during PWR!rock. Increased sway notable with balance on airex and eyes closed. Worked and discussed with patient about maintaining big arm swings with big steps and walking.   OBJECTIVE IMPAIRMENTS: Abnormal gait, decreased activity tolerance, decreased balance, decreased coordination, decreased endurance, decreased ROM, decreased strength, impaired tone, and postural dysfunction.   ACTIVITY LIMITATIONS: carrying, lifting, bending, sitting, standing, squatting, stairs, transfers, bed mobility, bathing, dressing, reach over head, and hygiene/grooming  PARTICIPATION LIMITATIONS: meal prep, cleaning, laundry, community activity, yard work, and church  PERSONAL FACTORS: Age, Past/current experiences, Time since onset of injury/illness/exacerbation, and 3+ comorbidities: ADD, prostate CA, HLD, lyme disease are also affecting patient's functional outcome.   REHAB POTENTIAL: Good  CLINICAL DECISION MAKING: Evolving/moderate complexity  EVALUATION COMPLEXITY: Moderate  PLAN:  PT FREQUENCY: 1-2x/week  PT DURATION: 6 weeks  PLANNED INTERVENTIONS: 97164- PT Re-evaluation, 97750- Physical Performance Testing, 97110-Therapeutic exercises, 97530- Therapeutic activity, [id number]- Neuromuscular re-education, 97535- Self Care, 02859- Manual therapy, [id number]- Gait training, [phone]- Canalith repositioning, Patient/Family education, Balance training, Stair training, Taping, Vestibular training, Cryotherapy, and Moist heat  PLAN FOR NEXT SESSION: Review PWR! Moves and progress  (maybe work on forward/back step and recover); continue gait training with arm swing.  Continue hip quad/HS strengthening, balance with squatting and picking things up from floor, . EC balance, compliant surface, narrow BOS, and balance with head turns   [facility] April Ma L Tracina Beaumont, PT, DPT 05/02/24 9:32 AM Phone: [phone] Fax: [phone]   [facility] at [facility] [address] [place], [address] [place], [place] [address] Phone # [phone] Fax # [phone] '

## 2024-05-03 NOTE — Therapy (Signed)
 OUTPATIENT PHYSICAL THERAPY NEURO TREATMENT   Patient Name: Peter Novak MRN: [id number] DOB:Nov 25, 1950, 73 y.o., male Today's Date: 05/06/2024   PCP: Katrinka Garnette KIDD, MD  REFERRING PROVIDER: Evonnie Asberry RAMAN, DO  END OF SESSION:  PT End of Session - 05/06/24 1007     Visit Number 7    Number of Visits 13    Date for PT Re-Evaluation 05/27/24    Authorization Type [employer]    Authorization Time Period approved 8 visits from 04/15/2024 - 05/27/2024    Authorization - Visit Number 7    Authorization - Number of Visits 8    PT Start Time 0935    PT Stop Time 1016    PT Time Calculation (min) 41 min    Activity Tolerance Patient tolerated treatment well   lightheadedness d/t low BP   Behavior During Therapy WFL for tasks assessed/performed               Past Medical History:  Diagnosis Date   ADD (attention deficit disorder with hyperactivity)    Allergy    seasonal   Arthritis    Cancer (HCC)    prostate cancer   Cataract    bilateral,removed   Complication of anesthesia    Constipation    GERD (gastroesophageal reflux disease)    GERD (gastroesophageal reflux disease)    Hearing loss    History of skin cancer    Melanoma X 2   Hyperlipidemia    Lyme disease 01/2021   Prostate cancer (HCC) 2016   Past Surgical History:  Procedure Laterality Date   CATARACT EXTRACTION, BILATERAL  March 2022 and April 2022   CHOLECYSTECTOMY  2005   COLONOSCOPY  2008   negative   ELBOW SURGERY Right 1994   ulnar transposition   EYE SURGERY  2021   cataract   HERNIA REPAIR  2005   INNER EAR SURGERY  2006   Incus transposition;oscillculoplasty; Dr Thaddeus GRIST EAR SURGERY  2011   Stirrup resection   LYMPHADENECTOMY Bilateral 11/04/2021   Procedure: LYMPHADENECTOMY, PELVIC;  Surgeon: Renda Glance, MD;  Location: [facility];  Service: Urology;  Laterality: Bilateral;   POLYPECTOMY     ROBOT ASSISTED LAPAROSCOPIC RADICAL PROSTATECTOMY N/A 11/04/2021    Procedure: XI ROBOTIC ASSISTED LAPAROSCOPIC RADICAL PROSTATECTOMY LEVEL 3;  Surgeon: Renda Glance, MD;  Location: [facility];  Service: Urology;  Laterality: N/A;   SHOULDER ARTHROSCOPY Left 2004   Dr Liam   TONSILLECTOMY AND ADENOIDECTOMY     VASECTOMY     Patient Active Problem List   Diagnosis Date Noted   Parkinson's disease without dyskinesia or fluctuating manifestations (HCC) 04/08/2024   Fatigue 01/16/2024   Excessive daytime sleepiness 01/16/2024   Genetic testing 09/21/2021   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   Bradycardia 11/05/2019   Recurrent episodes Dizziness/Ataxia 11/05/2019   White matter abnormality on MRI of brain 04/16/2019   Migraine with aura and with status migrainosus, not intractable 04/16/2019   Ocular migraine 04/16/2019   Migraine aura without headache (migraine equivalents) 04/16/2019   ADD (attention deficit disorder) 10/02/2014   Insomnia 10/02/2014   Erectile dysfunction 10/02/2014   Osteoarthritis 10/02/2014   Eustachian tube dysfunction 10/02/2014   GERD (gastroesophageal reflux disease) 02/12/2013   Prostate cancer (HCC) 02/12/2013   Benign neoplasm of adrenal gland 11/30/2009   Hyperglycemia 11/30/2009   History of melanoma 11/30/2009   HYPERLIPIDEMIA 07/10/2007    ONSET DATE: April 2025   REFERRING DIAG: G20.A1 (ICD-10-CM) - Parkinson's  disease without dyskinesia or fluctuating manifestations (HCC)  THERAPY DIAG:  Other symptoms and signs involving the nervous system  Muscle weakness (generalized)  Unsteadiness on feet  Other lack of coordination  Rationale for Evaluation and Treatment: Rehabilitation  SUBJECTIVE:                                                                                                                                                                                             SUBJECTIVE STATEMENT: My voice is not good today. Reports that BP yesterday was 90/60. Pt reports some lightheadedness  currently.    Pt accompanied by: self  PERTINENT HISTORY: ADD, prostate CA, HLD, lyme disease  PAIN:  Are you having pain? No  PRECAUTIONS: None  RED FLAGS: None   WEIGHT BEARING RESTRICTIONS: No  FALLS: Has patient fallen in last 6 months? No  LIVING ENVIRONMENT: Lives with: lives with their spouse Lives in: House/apartment Stairs: 1 step to enter; 1 story home Has following equipment at home: Single point cane, Shower bench, and Grab bars  PLOF: Independent, Vocation/Vocational requirements: retired, and Leisure: reading, outdoors, walking a mile after supper  PATIENT GOALS: I don't have any idea  OBJECTIVE:      TODAY'S TREATMENT: 05/06/24 Activity Comments  Vitals at start of session 88/60 mmHg. Provided water    Nustep for dynamic warm up and to increase BP Monitoring symptoms   Sitting ther-ex:  LAQ 2x10 10# each HS curl each LE 2 sets 10x 15# pulley  TB row 2x10 25# pulley   TB shoulder extension 2x10 25# pulley  for LE strengthenign and to maintain safety   Cueing for controlled eccentric phase of movement, use of abdominals with pulldowns   Vitals at end of session 104/64 mmHg.            PATIENT EDUCATION: Education details: encouraged to make appointment with PCP to medically manage hypotension  Person educated: Patient Education method: Explanation Education comprehension: verbalized understanding      HOME EXERCISE PROGRAM Access Code: [email or web address] URL: [email or web address] Date: 04/17/2024>04/24/2024  Prepared by: [facility] - Outpatient  Rehab - [facility]  Program Notes perform balance exercises in a corner for safety ADDED PWR! Moves in standing 10-20 reps daily  Exercises - Single Leg Heel Raise with Unilateral Counter Support  - 1 x daily - 5 x weekly - 2 sets - 10 reps - Squat with Chair Touch  - 1 x daily - 5 x weekly - 2 sets - 10 reps - Side Stepping with Resistance at Ankles and Counter Support  - 1 x  daily - 5 x weekly - 2  sets - 1 min hold - Romberg Stance with Eyes Closed  - 1 x daily - 5 x weekly - 2-3 sets - 30 sec hold - Romberg Stance with Head Rotation  - 1 x daily - 5 x weekly - 2-3 sets - 30 sec hold      Note: Objective measures were completed at Evaluation unless otherwise noted.  DIAGNOSTIC FINDINGS: 12/13/23 brain MRI: T2 FLAIR hyperintense signal changes within the cerebral white matter (moderate) and pons (mild), similar to the prior brain MRI of 05/10/2022. Findings are nonspecific and differential considerations include chronic small vessel ischemic disease, sequelae of a prior infectious/inflammatory process and sequelae of demyelinating disease, among others.   COGNITION: Overall cognitive status: Within functional limits for tasks assessed   SENSATION: Pt denies N/T in UEs/LEs  COORDINATION: Alternating pronation/supination: slightly slowed B Alternating toe tap: hypokinesia L LE Finger to nose: WNL B   MUSCLE TONE: rigidity in L LE, especially in ankle   POSTURE: rounded shoulders, forward head, and weight shift left  LOWER EXTREMITY ROM:     Active  Right Eval Left Eval  Hip flexion    Hip extension    Hip abduction    Hip adduction    Hip internal rotation    Hip external rotation    Knee flexion    Knee extension    Ankle dorsiflexion 10 3  Ankle plantarflexion    Ankle inversion    Ankle eversion     (Blank rows = not tested)  LOWER EXTREMITY MMT:    MMT (in sitting) Right Eval Left Eval  Hip flexion 4+ 4+  Hip extension    Hip abduction 3+ 3+  Hip adduction 4 4  Hip internal rotation    Hip external rotation    Knee flexion 4+ 4  Knee extension 4 4  Ankle dorsiflexion 5 5  Ankle plantarflexion 5 5  Ankle inversion    Ankle eversion    (Blank rows = not tested)  GAIT: Findings: Assistive device utilized:None, Level of assistance: Complete Independence, and Comments: reduced R arm swing  FUNCTIONAL TESTS:  5 times  sit to stand: 18.42 sec without UEs 10 meter walk test: 9.21 sec  without AD  (3.56 ft/sec)                                                                                                                              TREATMENT DATE: 04/15/24     PATIENT EDUCATION: Education details: orthostatic hypotension -- monitoring, management and to get medical management Education method: Explanation, Demonstration, Tactile cues, Verbal cues, and Handouts Education comprehension: verbalized understanding  HOME EXERCISE PROGRAM: Not yet initiated   GOALS: Goals reviewed with patient? Yes  SHORT TERM GOALS: Target date: 05/06/2024  Patient to be independent with initial HEP. Baseline: HEP initiated Goal status: IN PROGRESS    LONG TERM GOALS: Target date: 05/27/2024  Patient to be independent with advanced HEP. Baseline:  Not yet initiated  Goal status: IN PROGRESS  Patient to verbalize understanding of local Parkinson's Disease community resources including community fitness post D/C.   Baseline: Not yet initiated  Goal status: IN PROGRESS  Patient to improve MiniBestTest score to atleast 17-21 to decrease risk of falls.  Baseline: 19 04/17/24  Goal status: IN PROGRESS 04/17/24  Patient to demonstrate 50% improvement in R UE swing during gait.  Baseline: no UE swing Goal status: IN PROGRESS  Patient to demonstrate 5xSTS test in <15 sec in order to decrease risk of falls.   Baseline: 18 sec Goal status: IN PROGRESS  Patient to report participation in weekly exercise class. Baseline: Not yet initiated  Goal status: IN PROGRESS   ASSESSMENT:  CLINICAL IMPRESSION: Patient arrived to session with report of continued hypotension. BP at start of session was low, thus provided water  and seated cardio warm up to safely increase vital signs. Patient tolerated this activity well, thus proceeded with sitting LE and core strengthening to address strength deficits and mitigate vitals. Patient  tolerated session well. Vitals upon leaving were improved and patient reported improved lightheadedness upon leaving.   OBJECTIVE IMPAIRMENTS: Abnormal gait, decreased activity tolerance, decreased balance, decreased coordination, decreased endurance, decreased ROM, decreased strength, impaired tone, and postural dysfunction.   ACTIVITY LIMITATIONS: carrying, lifting, bending, sitting, standing, squatting, stairs, transfers, bed mobility, bathing, dressing, reach over head, and hygiene/grooming  PARTICIPATION LIMITATIONS: meal prep, cleaning, laundry, community activity, yard work, and church  PERSONAL FACTORS: Age, Past/current experiences, Time since onset of injury/illness/exacerbation, and 3+ comorbidities: ADD, prostate CA, HLD, lyme disease are also affecting patient's functional outcome.   REHAB POTENTIAL: Good  CLINICAL DECISION MAKING: Evolving/moderate complexity  EVALUATION COMPLEXITY: Moderate  PLAN:  PT FREQUENCY: 1-2x/week  PT DURATION: 6 weeks  PLANNED INTERVENTIONS: 97164- PT Re-evaluation, 97750- Physical Performance Testing, 97110-Therapeutic exercises, 97530- Therapeutic activity, [id number]- Neuromuscular re-education, 97535- Self Care, 02859- Manual therapy, [id number]- Gait training, [id number]- Canalith repositioning, Patient/Family education, Balance training, Stair training, Taping, Vestibular training, Cryotherapy, and Moist heat  PLAN FOR NEXT SESSION: will need to resubmit [employer] Review PWR! Moves and progress (maybe work on forward/back step and recover); continue gait training with arm swing.  Continue hip quad/HS strengthening, balance with squatting and picking things up from floor, . EC balance, compliant surface, narrow BOS, and balance with head turns    Louana Terrilyn Christians, PT, DPT 05/06/24 10:18 AM  [facility] at [facility] [address], [address] [place], [place] [address] Phone # [phone] Fax # [phone]

## 2024-05-06 ENCOUNTER — Ambulatory Visit: Admitting: Physical Therapy

## 2024-05-06 ENCOUNTER — Encounter: Payer: Self-pay | Admitting: Physical Therapy

## 2024-05-06 DIAGNOSIS — R2689 Other abnormalities of gait and mobility: Secondary | ICD-10-CM | POA: Diagnosis not present

## 2024-05-06 DIAGNOSIS — R29818 Other symptoms and signs involving the nervous system: Secondary | ICD-10-CM | POA: Diagnosis not present

## 2024-05-06 DIAGNOSIS — R278 Other lack of coordination: Secondary | ICD-10-CM | POA: Diagnosis not present

## 2024-05-06 DIAGNOSIS — R2681 Unsteadiness on feet: Secondary | ICD-10-CM

## 2024-05-06 DIAGNOSIS — M6281 Muscle weakness (generalized): Secondary | ICD-10-CM

## 2024-05-06 DIAGNOSIS — R471 Dysarthria and anarthria: Secondary | ICD-10-CM | POA: Diagnosis not present

## 2024-05-07 NOTE — Therapy (Incomplete)
 OUTPATIENT PHYSICAL THERAPY NEURO TREATMENT   Patient Name: Peter Novak MRN: [id number] DOB:[birth year], 73 y.o., male Today's Date: 05/07/2024   PCP: Katrinka Garnette KIDD, MD  REFERRING PROVIDER: Evonnie Asberry RAMAN, DO  END OF SESSION:         Past Medical History:  Diagnosis Date   ADD (attention deficit disorder with hyperactivity)    Allergy    seasonal   Arthritis    Cancer [facility])    prostate cancer   Cataract    bilateral,removed   Complication of anesthesia    Constipation    GERD (gastroesophageal reflux disease)    GERD (gastroesophageal reflux disease)    Hearing loss    History of skin cancer    Melanoma X 2   Hyperlipidemia    Lyme disease 01/2021   Prostate cancer (HCC) 2016   Past Surgical History:  Procedure Laterality Date   CATARACT EXTRACTION, BILATERAL  March 2022 and April 2022   CHOLECYSTECTOMY  2005   COLONOSCOPY  2008   negative   ELBOW SURGERY Right 1994   ulnar transposition   EYE SURGERY  2021   cataract   HERNIA REPAIR  2005   INNER EAR SURGERY  2006   Incus transposition;oscillculoplasty; Dr Thaddeus GRIST EAR SURGERY  2011   Stirrup resection   LYMPHADENECTOMY Bilateral 11/04/2021   Procedure: LYMPHADENECTOMY, PELVIC;  Surgeon: Renda Glance, MD;  Location: [facility];  Service: Urology;  Laterality: Bilateral;   POLYPECTOMY     ROBOT ASSISTED LAPAROSCOPIC RADICAL PROSTATECTOMY N/A 11/04/2021   Procedure: XI ROBOTIC ASSISTED LAPAROSCOPIC RADICAL PROSTATECTOMY LEVEL 3;  Surgeon: Renda Glance, MD;  Location: [facility];  Service: Urology;  Laterality: N/A;   SHOULDER ARTHROSCOPY Left 2004   Dr Liam   TONSILLECTOMY AND ADENOIDECTOMY     VASECTOMY     Patient Active Problem List   Diagnosis Date Noted   Parkinson's disease without dyskinesia or fluctuating manifestations (HCC) 04/08/2024   Fatigue 01/16/2024   Excessive daytime sleepiness 01/16/2024   Genetic testing 09/21/2021   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    Bradycardia 11/05/2019   Recurrent episodes Dizziness/Ataxia 11/05/2019   White matter abnormality on MRI of brain 04/16/2019   Migraine with aura and with status migrainosus, not intractable 04/16/2019   Ocular migraine 04/16/2019   Migraine aura without headache (migraine equivalents) 04/16/2019   ADD (attention deficit disorder) 10/02/2014   Insomnia 10/02/2014   Erectile dysfunction 10/02/2014   Osteoarthritis 10/02/2014   Eustachian tube dysfunction 10/02/2014   GERD (gastroesophageal reflux disease) 02/12/2013   Prostate cancer (HCC) 02/12/2013   Benign neoplasm of adrenal gland 11/30/2009   Hyperglycemia 11/30/2009   History of melanoma 11/30/2009   HYPERLIPIDEMIA 07/10/2007    ONSET DATE: April 2025   REFERRING DIAG: G20.A1 (ICD-10-CM) - Parkinson's disease without dyskinesia or fluctuating manifestations (HCC)  THERAPY DIAG:  No diagnosis found.  Rationale for Evaluation and Treatment: Rehabilitation  SUBJECTIVE:  SUBJECTIVE STATEMENT: My voice is not good today. Reports that BP yesterday was 90/60. Pt reports some lightheadedness currently.    Pt accompanied by: self  PERTINENT HISTORY: ADD, prostate CA, HLD, lyme disease  PAIN:  Are you having pain? No  PRECAUTIONS: None  RED FLAGS: None   WEIGHT BEARING RESTRICTIONS: No  FALLS: Has patient fallen in last 6 months? No  LIVING ENVIRONMENT: Lives with: lives with their spouse Lives in: House/apartment Stairs: 1 step to enter; 1 story home Has following equipment at home: Single point cane, Shower bench, and Grab bars  PLOF: Independent, Vocation/Vocational requirements: retired, and Leisure: reading, outdoors, walking a mile after supper  PATIENT GOALS: I don't have any idea  OBJECTIVE:     TODAY'S TREATMENT:  05/08/24 Activity Comments                         TODAY'S TREATMENT: 05/06/24 Activity Comments  Vitals at start of session 88/60 mmHg. Provided water    Nustep for dynamic warm up and to increase BP Monitoring symptoms   Sitting ther-ex:  LAQ 2x10 10# each HS curl each LE 2 sets 10x 15# pulley  TB row 2x10 25# pulley   TB shoulder extension 2x10 25# pulley  for LE strengthenign and to maintain safety   Cueing for controlled eccentric phase of movement, use of abdominals with pulldowns   Vitals at end of session 104/64 mmHg.            PATIENT EDUCATION: Education details: encouraged to make appointment with PCP to medically manage hypotension  Person educated: Patient Education method: Explanation Education comprehension: verbalized understanding      HOME EXERCISE PROGRAM Access Code: [email or web address] URL: [email or web address] Date: 04/17/2024>04/24/2024  Prepared by: [facility] - Outpatient  Rehab - [facility]  Program Notes perform balance exercises in a corner for safety ADDED PWR! Moves in standing 10-20 reps daily  Exercises - Single Leg Heel Raise with Unilateral Counter Support  - 1 x daily - 5 x weekly - 2 sets - 10 reps - Squat with Chair Touch  - 1 x daily - 5 x weekly - 2 sets - 10 reps - Side Stepping with Resistance at Ankles and Counter Support  - 1 x daily - 5 x weekly - 2 sets - 1 min hold - Romberg Stance with Eyes Closed  - 1 x daily - 5 x weekly - 2-3 sets - 30 sec hold - Romberg Stance with Head Rotation  - 1 x daily - 5 x weekly - 2-3 sets - 30 sec hold      Note: Objective measures were completed at Evaluation unless otherwise noted.  DIAGNOSTIC FINDINGS: 12/13/23 brain MRI: T2 FLAIR hyperintense signal changes within the cerebral white matter (moderate) and pons (mild), similar to the prior brain MRI of 05/10/2022. Findings are nonspecific and differential considerations include chronic small vessel ischemic disease,  sequelae of a prior infectious/inflammatory process and sequelae of demyelinating disease, among others.   COGNITION: Overall cognitive status: Within functional limits for tasks assessed   SENSATION: Pt denies N/T in UEs/LEs  COORDINATION: Alternating pronation/supination: slightly slowed B Alternating toe tap: hypokinesia L LE Finger to nose: WNL B   MUSCLE TONE: rigidity in L LE, especially in ankle   POSTURE: rounded shoulders, forward head, and weight shift left  LOWER EXTREMITY ROM:     Active  Right Eval Left Eval  Hip flexion    Hip extension  Hip abduction    Hip adduction    Hip internal rotation    Hip external rotation    Knee flexion    Knee extension    Ankle dorsiflexion 10 3  Ankle plantarflexion    Ankle inversion    Ankle eversion     (Blank rows = not tested)  LOWER EXTREMITY MMT:    MMT (in sitting) Right Eval Left Eval  Hip flexion 4+ 4+  Hip extension    Hip abduction 3+ 3+  Hip adduction 4 4  Hip internal rotation    Hip external rotation    Knee flexion 4+ 4  Knee extension 4 4  Ankle dorsiflexion 5 5  Ankle plantarflexion 5 5  Ankle inversion    Ankle eversion    (Blank rows = not tested)  GAIT: Findings: Assistive device utilized:None, Level of assistance: Complete Independence, and Comments: reduced R arm swing  FUNCTIONAL TESTS:  5 times sit to stand: 18.42 sec without UEs 10 meter walk test: 9.21 sec  without AD  (3.56 ft/sec)                                                                                                                              TREATMENT DATE: 04/15/24     PATIENT EDUCATION: Education details: orthostatic hypotension -- monitoring, management and to get medical management Education method: Explanation, Demonstration, Tactile cues, Verbal cues, and Handouts Education comprehension: verbalized understanding  HOME EXERCISE PROGRAM: Not yet initiated   GOALS: Goals reviewed with patient?  Yes  SHORT TERM GOALS: Target date: 05/06/2024  Patient to be independent with initial HEP. Baseline: HEP initiated Goal status: IN PROGRESS    LONG TERM GOALS: Target date: 05/27/2024  Patient to be independent with advanced HEP. Baseline: Not yet initiated  Goal status: IN PROGRESS  Patient to verbalize understanding of local Parkinson's Disease community resources including community fitness post D/C.   Baseline: Not yet initiated  Goal status: IN PROGRESS  Patient to improve MiniBestTest score to atleast 17-21 to decrease risk of falls.  Baseline: 19 04/17/24  Goal status: IN PROGRESS 04/17/24  Patient to demonstrate 50% improvement in R UE swing during gait.  Baseline: no UE swing Goal status: IN PROGRESS  Patient to demonstrate 5xSTS test in <15 sec in order to decrease risk of falls.   Baseline: 18 sec Goal status: IN PROGRESS  Patient to report participation in weekly exercise class. Baseline: Not yet initiated  Goal status: IN PROGRESS   ASSESSMENT:  CLINICAL IMPRESSION: Patient arrived to session with report of continued hypotension. BP at start of session was low, thus provided water  and seated cardio warm up to safely increase vital signs. Patient tolerated this activity well, thus proceeded with sitting LE and core strengthening to address strength deficits and mitigate vitals. Patient tolerated session well. Vitals upon leaving were improved and patient reported improved lightheadedness upon leaving.   OBJECTIVE IMPAIRMENTS: Abnormal gait, decreased activity  tolerance, decreased balance, decreased coordination, decreased endurance, decreased ROM, decreased strength, impaired tone, and postural dysfunction.   ACTIVITY LIMITATIONS: carrying, lifting, bending, sitting, standing, squatting, stairs, transfers, bed mobility, bathing, dressing, reach over head, and hygiene/grooming  PARTICIPATION LIMITATIONS: meal prep, cleaning, laundry, community activity, yard work,  and church  PERSONAL FACTORS: Age, Past/current experiences, Time since onset of injury/illness/exacerbation, and 3+ comorbidities: ADD, prostate CA, HLD, lyme disease are also affecting patient's functional outcome.   REHAB POTENTIAL: Good  CLINICAL DECISION MAKING: Evolving/moderate complexity  EVALUATION COMPLEXITY: Moderate  PLAN:  PT FREQUENCY: 1-2x/week  PT DURATION: 6 weeks  PLANNED INTERVENTIONS: 97164- PT Re-evaluation, 97750- Physical Performance Testing, 97110-Therapeutic exercises, 97530- Therapeutic activity, [id number]- Neuromuscular re-education, 97535- Self Care, 02859- Manual therapy, [id number]- Gait training, [phone]- Canalith repositioning, Patient/Family education, Balance training, Stair training, Taping, Vestibular training, Cryotherapy, and Moist heat  PLAN FOR NEXT SESSION: will need to resubmit [employer] Review PWR! Moves and progress (maybe work on forward/back step and recover); continue gait training with arm swing.  Continue hip quad/HS strengthening, balance with squatting and picking things up from floor, . EC balance, compliant surface, narrow BOS, and balance with head turns    Referring diagnosis? *** Treatment diagnosis? (if different than referring diagnosis) *** What was this (referring dx) caused by? []  Surgery []  Fall []  Ongoing issue []  Arthritis []  Other: ____________  Laterality: []  Rt []  Lt []  Both  Check all possible CPT codes:  *CHOOSE 10 OR LESS*    See Planned Interventions listed in the Plan section of the Evaluation.     Louana Terrilyn Christians, PT, DPT 05/07/24 2:33 PM  [facility] [address] [place], [address] [place], [place] [address] Phone # [phone] Fax # [phone]

## 2024-05-08 ENCOUNTER — Ambulatory Visit: Admitting: Neurology

## 2024-05-08 ENCOUNTER — Ambulatory Visit: Admitting: Physical Therapy

## 2024-05-10 ENCOUNTER — Ambulatory Visit

## 2024-05-10 ENCOUNTER — Other Ambulatory Visit: Payer: Self-pay

## 2024-05-10 DIAGNOSIS — R471 Dysarthria and anarthria: Secondary | ICD-10-CM | POA: Diagnosis not present

## 2024-05-10 DIAGNOSIS — R278 Other lack of coordination: Secondary | ICD-10-CM | POA: Diagnosis not present

## 2024-05-10 DIAGNOSIS — R2681 Unsteadiness on feet: Secondary | ICD-10-CM | POA: Diagnosis not present

## 2024-05-10 DIAGNOSIS — M6281 Muscle weakness (generalized): Secondary | ICD-10-CM | POA: Diagnosis not present

## 2024-05-10 DIAGNOSIS — R2689 Other abnormalities of gait and mobility: Secondary | ICD-10-CM | POA: Diagnosis not present

## 2024-05-10 DIAGNOSIS — R29818 Other symptoms and signs involving the nervous system: Secondary | ICD-10-CM | POA: Diagnosis not present

## 2024-05-10 NOTE — Therapy (Signed)
 OUTPATIENT SPEECH LANGUAGE PATHOLOGY PARKINSON'S EVALUATION   Patient Name: Peter Novak MRN: [id number] DOB:[birth year], 73 y.o., male Today's Date: 05/10/2024  PCP: Katrinka Senior, MD REFERRING PROVIDER: Evonnie Asberry RAMAN, DO  END OF SESSION:  End of Session - 05/10/24 2319     Visit Number 1    Number of Visits 7    Date for SLP Re-Evaluation 07/04/23    Authorization Type [employer]    SLP Start Time 0933    SLP Stop Time  1015    SLP Time Calculation (min) 42 min    Activity Tolerance Patient tolerated treatment well          Past Medical History:  Diagnosis Date   ADD (attention deficit disorder with hyperactivity)    Allergy    seasonal   Arthritis    Cancer (HCC)    prostate cancer   Cataract    bilateral,removed   Complication of anesthesia    Constipation    GERD (gastroesophageal reflux disease)    GERD (gastroesophageal reflux disease)    Hearing loss    History of skin cancer    Melanoma X 2   Hyperlipidemia    Lyme disease 01/2021   Prostate cancer (HCC) 2016   Past Surgical History:  Procedure Laterality Date   CATARACT EXTRACTION, BILATERAL  March 2022 and April 2022   CHOLECYSTECTOMY  2005   COLONOSCOPY  2008   negative   ELBOW SURGERY Right 1994   ulnar transposition   EYE SURGERY  2021   cataract   HERNIA REPAIR  2005   INNER EAR SURGERY  2006   Incus transposition;oscillculoplasty; Dr Thaddeus GRIST EAR SURGERY  2011   Stirrup resection   LYMPHADENECTOMY Bilateral 11/04/2021   Procedure: LYMPHADENECTOMY, PELVIC;  Surgeon: Renda Glance, MD;  Location: [facility];  Service: Urology;  Laterality: Bilateral;   POLYPECTOMY     ROBOT ASSISTED LAPAROSCOPIC RADICAL PROSTATECTOMY N/A 11/04/2021   Procedure: XI ROBOTIC ASSISTED LAPAROSCOPIC RADICAL PROSTATECTOMY LEVEL 3;  Surgeon: Renda Glance, MD;  Location: [facility];  Service: Urology;  Laterality: N/A;   SHOULDER ARTHROSCOPY Left 2004   Dr Liam   TONSILLECTOMY AND ADENOIDECTOMY      VASECTOMY     Patient Active Problem List   Diagnosis Date Noted   Parkinson's disease without dyskinesia or fluctuating manifestations (HCC) 04/08/2024   Fatigue 01/16/2024   Excessive daytime sleepiness 01/16/2024   Genetic testing 09/21/2021   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   Bradycardia 11/05/2019   Recurrent episodes Dizziness/Ataxia 11/05/2019   White matter abnormality on MRI of brain 04/16/2019   Migraine with aura and with status migrainosus, not intractable 04/16/2019   Ocular migraine 04/16/2019   Migraine aura without headache (migraine equivalents) 04/16/2019   ADD (attention deficit disorder) 10/02/2014   Insomnia 10/02/2014   Erectile dysfunction 10/02/2014   Osteoarthritis 10/02/2014   Eustachian tube dysfunction 10/02/2014   GERD (gastroesophageal reflux disease) 02/12/2013   Prostate cancer (HCC) 02/12/2013   Benign neoplasm of adrenal gland 11/30/2009   Hyperglycemia 11/30/2009   History of melanoma 11/30/2009   HYPERLIPIDEMIA 07/10/2007    ONSET DATE: Script 04/08/24  REFERRING DIAG:  G20.A1 (ICD-10-CM) - Parkinson's disease without dyskinesia or fluctuating manifestations (HCC)    THERAPY DIAG:  Dysarthria and anarthria  Rationale for Evaluation and Treatment: Rehabilitation  SUBJECTIVE:   SUBJECTIVE STATEMENT: I saw Dr. Okey in September '24 and said I had vocal cord changes due to aging.  Pt accompanied by: self  PERTINENT HISTORY: ADD, prostate CA, HLD, lyme disease. Pt had evaluation with ST at [facility] [facility]) and then did not complete therapy course.   PAIN:  Are you having pain? No  FALLS: Has patient fallen in last 6 months?  See PT evaluation for details  LIVING ENVIRONMENT: Lives with: lives with their spouse Lives in: House/apartment  PLOF:  Level of assistance: Independent with ADLs, Independent with IADLs Employment: Retired  PATIENT GOALS: Normalize voice quality  OBJECTIVE:  Note: Objective measures  were completed at Evaluation unless otherwise noted.  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  Asymmetric hearing loss with the right ear worse than the left. The right ear shows air-bone gap and mixed hearing loss, could be due to prosthesis displacement or scar tissue. The left ear has primarily sensorineural hearing loss. Discussed CROS hearing aid if no surgical options are available. CT Temporal bone scan will help assess the ossicular chain and determine the cause of hearing loss. - Refer to Dr. Tobie for further evaluation and potential surgical options discussion - Order dedicated temporal bone scan to assess the ossicular chain/prosthesis location - Advised to bring previous audiograms to the appointment with Dr. Tobie to see if there was a significant change relative to the last Audio done through [facility]    Voice Changes/dysphonia Voice changes with improvement noted after voice therapy, likely related to thinning of the vocal cords and possible seasonal allergies and PND, GERD LPR. No current symptoms of cracking or raspy/strained voice. Continued use of allergy medication and Flonase , along with minimizing reflux, should help maintain voice quality. - Continue allergy medication and Flonase  - Advise on minimizing reflux with diet and lifestyle changes  - Schedule follow-up if voice issues recur in the future, continue voice therapy exercises    Right Submandibular Stone - CT neck reviewed with the patient  Small right submandibular stone (~1 cm) with gland atrophy, no mass or lymphadenopathy and no sialoadenitis or retention of saliva. Currently asymptomatic with no dry mouth or pain/SMG swelling on the right. No immediate intervention required unless symptoms develop. - Monitor for symptoms such as pain or dry mouth - No immediate intervention required    Chronic Fatigue Chronic fatigue with a history of Lyme disease and multiple normal blood work results. Reports easy napping and nocturnal urination due to prostate cancer. Discussed possibility of sleep apnea and need for a sleep study. Explained Inspire as an option for those who fail CPAP and have moderate to severe apnea. - Recommend sleep study if symptoms persist - Contact primary care physician for repeat Lyme disease test   Follow-up - Schedule appointment with Dr. Tobie for hearing evaluation after CT T-bone - Contact primary care physician for Lyme disease test and for HST if needed in the future  - Schedule follow-up with me in 6 months for sx check   Patient is a 73 y.o. M who was seen today for voice evaluation upon referral from Dr. Okey with videostroboscopy demonstrating VF atrophy. Pt presents with mild hoarseness, occasional vocal strain, c/o vocal fatigue and sensation of tightness in throat when speaking. Pt evidences reduced coordination of voice subsystems by speaking on residual capacity and with intermittent pressed voice. Pt demonstrated habitual throat clearing throughout session, x14 over 45 minute session. Co-morbidities which likely impact voice quality include dx of GERD,  seasonal allergies causing post nasal drip. Pt is on medications for both. Will plan to instruct for additional reflux precautions. Pt would benefit from ST to address dysphonia to improve communication efficacy.   MR BRAIN WO CONT 12/13/23 IMPRESSION: 1.  No evidence of an acute intracranial abnormality. 2. T2 FLAIR hyperintense signal changes within the cerebral white matter (moderate) and pons (mild), similar to the prior brain MRI of 05/10/2022. Findings are nonspecific and differential considerations include chronic small vessel ischemic disease, sequelae of a prior infectious/inflammatory process and sequelae of demyelinating disease, among others.  Dr. Evonnie 04/08/24 1.  Parkinsons  disease, new dx             - Skin biopsy positive for alpha-synuclein             - He and I discussed again that I would like to see him wean off the Xanax , particularly the daytime Xanax .  His wife and daughter seem somewhat surprised that he was not on daytime Xanax , but the patient ultimately did admit that this was the case.    Discussed that Xanax  can cause confusion and gait instability and I do not like to see my parkinsonism patients on this medication, as Parkinson's alone can cause these problems.  He can talk with his prescribing NPP about this             -We decided to add carbidopa /levodopa  25/100.  1/2 tab tid x 1 wk, then 1/2 in am & noon & 1 at night for a week, then 1/2 in am &1 at noon &night for a week, then 1 po tid at 9 AM/1 PM/5 PM.  Risks, benefits, side effects and alternative therapies were discussed.  The opportunity to ask questions was given and they were answered to the best of my ability.  The patient expressed understanding and willingness to follow the outlined treatment protocols.             -I will refer the patient to the Parkinson's program for physical and speech therapy.  We talked about the importance of safe, cardiovascular exercise in Parkinson's disease.             -We discussed community resources in the area including patient support groups and community exercise programs for PD and pt education was provided to the patient.              -discussed MIND diet.             - Patient/wife/daughter had multiple questions and I answered those to the best of my ability today.   2.  Patient has had multiple acute episodes of expressive aphasia and trouble writing with negative stroke workup             - Given that he has had repetitive episodes over the years (actually over many years, nearly a decade) of inability to find words and trouble speaking followed by vision change, one has to wonder whether or not these are not complicated migraine.  Stroke workup in the  past has been unremarkable.  Seizure also could be on the differential diagnosis, but is a bit less likely.             - Cardiology ordered zio patch and it came in the mail, but ultimately patient did not want to proceed with that   3.  GAD             - I do think that  the patient needs a Veterinary surgeon.  He and I discussed this today.             - He asked about being irritated and a generalized feeling of frustration.  Ultimately, it was difficult for me to determine if this was either tremor, or if this was anxiety in general.  I told him to see if the levodopa  helped.  If not, he could follow-up with his psychiatry NPP   4.  Hyperreflexia             - Likely physiologic.  No abnormal reflexes.  May consider MRI cervical spine if neck pain develops or signs/symptoms of cervical myelopathy.  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   5.  Constipation             -discussed nature and pathophysiology and association with PD             -discussed importance of hydration.  Pt is to increase water  intake             - Discussed increased exercise.  COGNITION: Overall cognitive status: Within functional limits for tasks assessed  MOTOR SPEECH: Overall motor speech: Appears intact Level of impairment: Phrase, Sentence, and Conversation Respiration: thoracic breathing and clavicular breathing Phonation: low vocal intensity Resonance: WFL Articulation: Appears intact Intelligibility: Intelligible Motor planning: Appears intact Effective technique: increased vocal intensity and intent, purposefully speaking  ORAL MOTOR EXAMINATION: Overall status: WFL   OBJECTIVE VOICE ASSESSMENT: Sustained ah maximum phonation time: 12.7 seconds (below WNL) Sustained ah loudness average: 68 dB (below WNL) Oral reading (passage) loudness average: 69 dB Oral reading loudness range: 61-75  dB Conversational loudness average: 60-75 dB Conversational loudness range: 68 dB Voice quality: hoarse, low vocal intensity, and vocal fatigue Stimulability trials: Given SLP modeling and occasional min cues, loudness average increased to 72dB (range of 64 to 77) at paragraph reading level.  Comments: SLP provided pt explanation of why voice was thin sounding to him and that he was not using diaphragm musculature as he should in order to produce WNL voice.  Completed audio recording of patients baseline voice without cueing from SLP: Yes  Pt does not report difficulty with swallowing which does not warrant further evaluation.  PATIENT REPORTED OUTCOME MEASURES (PROM): VHI: or VR-QoL to be provided in first session                                                                                                                            TREATMENT DATE:  If treatment provided during day of evaluation a therapy charge was not submitted due to therapy not being authorized at time of evaluation.  05/10/24: SLP introduced rationale of PhoRTE and of speech with intent, purpose, and mindfulness. SLP taught pt first task in PhoRTE (loud /a/) and instructed pt to complete 10 BID.  PATIENT EDUCATION: Education details: see treatment date Person educated: Patient Education method: Explanation, Demonstration, and Verbal cues Education comprehension: verbalized understanding, returned demonstration, verbal cues required, and needs further education  HOME EXERCISE PROGRAM: PhoRTE   GOALS: Goals reviewed with patient? Yes  SHORT TERM GOALS: Target date: 07/12/24 (first ST 06/10/24)  Pt will perform PhoRTE with rare min A in two sessions Baseline: Goal status: INITIAL  2.  Pt will report improvement in PROM after 4 weeks ST Baseline:  Goal status: INITIAL  3.  Pt will perform AB at rest 80% of the time, 2 sessions Baseline:  Goal status: INITIAL   LONG TERM GOALS: Target date:  07/26/24  Pt will improve PROM compared to PROM  Baseline:  Goal status: INITIAL  2.  Pt will perform AB with 8 minutes simple conversation 80% of the time, 2 sessions Baseline:  Goal status: INITIAL  3.  Pt will perform PhoRTE with mod I in two sessions Baseline:  Goal status: INITIAL   ASSESSMENT:  CLINICAL IMPRESSION: Patient is a 73 y.o. M who was seen today for assessment for decr'd voice due to suspected decr'd breath support as a result of PD. Pt reports feeling tightness in his chest at times during speaking, and a thin voice at times. He also reports s/sx of vocal fatigue.   OBJECTIVE IMPAIRMENTS: Objective impairments include dysarthria and voice disorder. These impairments are limiting patient from household responsibilities, ADLs/IADLs, and effectively communicating at home and in community.Factors affecting potential to achieve goals and functional outcome are none noted.. Patient will benefit from skilled SLP services to address above impairments and improve overall function.  REHAB POTENTIAL: Good  PLAN:  SLP FREQUENCY: 1x/week  SLP DURATION: 6 weeks  PLANNED INTERVENTIONS: Internal/external aids, Functional tasks, SLP instruction and feedback, Compensatory strategies, Patient/family education, [phone] Treatment of speech (30 or 45 min) , and voice exercises   Referring diagnosis?  G20.A1 (ICD-10-CM) - Parkinson's disease without dyskinesia or fluctuating manifestations (HCC)   Treatment diagnosis? (if different than referring diagnosis) Dysarthria and anarthria R47.1  What was this (referring dx) caused by? []  Surgery []  Fall [x]  Ongoing issue []  Arthritis []  Other: ____________  Laterality: []  Rt []  Lt [x]  Both  Check all possible CPT codes:  *CHOOSE 10 OR LESS*    See Planned Interventions listed in the Plan section of the Evaluation.    Thera Basden, CCC-SLP 05/10/2024, 11:20 PM

## 2024-05-13 ENCOUNTER — Ambulatory Visit: Admitting: Occupational Therapy

## 2024-05-13 ENCOUNTER — Encounter: Payer: Self-pay | Admitting: Physical Therapy

## 2024-05-13 ENCOUNTER — Ambulatory Visit: Admitting: Physical Therapy

## 2024-05-13 DIAGNOSIS — R29818 Other symptoms and signs involving the nervous system: Secondary | ICD-10-CM

## 2024-05-13 DIAGNOSIS — R2681 Unsteadiness on feet: Secondary | ICD-10-CM

## 2024-05-13 DIAGNOSIS — R278 Other lack of coordination: Secondary | ICD-10-CM | POA: Diagnosis not present

## 2024-05-13 DIAGNOSIS — R471 Dysarthria and anarthria: Secondary | ICD-10-CM | POA: Diagnosis not present

## 2024-05-13 DIAGNOSIS — R2689 Other abnormalities of gait and mobility: Secondary | ICD-10-CM | POA: Diagnosis not present

## 2024-05-13 DIAGNOSIS — M6281 Muscle weakness (generalized): Secondary | ICD-10-CM

## 2024-05-13 NOTE — Therapy (Signed)
 OUTPATIENT PHYSICAL THERAPY NEURO TREATMENT   Patient Name: Peter Novak MRN: [id number] DOB:1951/05/20, 73 y.o., male Today's Date: 05/13/2024   PCP: Katrinka Garnette KIDD, MD  REFERRING PROVIDER: Evonnie Asberry RAMAN, DO  END OF SESSION:  PT End of Session - 05/13/24 1325     Visit Number 8    Number of Visits 13    Date for PT Re-Evaluation 05/27/24    Authorization Type [employer]    Authorization Time Period approved 8 visits from 04/15/2024 - 05/27/2024    Authorization - Visit Number 8    Authorization - Number of Visits 8    PT Start Time 1322    PT Stop Time 1400    PT Time Calculation (min) 38 min    Activity Tolerance Patient tolerated treatment well   lightheadedness d/t low BP   Behavior During Therapy WFL for tasks assessed/performed                Past Medical History:  Diagnosis Date   ADD (attention deficit disorder with hyperactivity)    Allergy    seasonal   Arthritis    Cancer (HCC)    prostate cancer   Cataract    bilateral,removed   Complication of anesthesia    Constipation    GERD (gastroesophageal reflux disease)    GERD (gastroesophageal reflux disease)    Hearing loss    History of skin cancer    Melanoma X 2   Hyperlipidemia    Lyme disease 01/2021   Prostate cancer (HCC) 2016   Past Surgical History:  Procedure Laterality Date   CATARACT EXTRACTION, BILATERAL  March 2022 and April 2022   CHOLECYSTECTOMY  2005   COLONOSCOPY  2008   negative   ELBOW SURGERY Right 1994   ulnar transposition   EYE SURGERY  2021   cataract   HERNIA REPAIR  2005   INNER EAR SURGERY  2006   Incus transposition;oscillculoplasty; Dr Thaddeus GRIST EAR SURGERY  2011   Stirrup resection   LYMPHADENECTOMY Bilateral 11/04/2021   Procedure: LYMPHADENECTOMY, PELVIC;  Surgeon: Renda Glance, MD;  Location: [facility];  Service: Urology;  Laterality: Bilateral;   POLYPECTOMY     ROBOT ASSISTED LAPAROSCOPIC RADICAL PROSTATECTOMY N/A 11/04/2021    Procedure: XI ROBOTIC ASSISTED LAPAROSCOPIC RADICAL PROSTATECTOMY LEVEL 3;  Surgeon: Renda Glance, MD;  Location: [facility];  Service: Urology;  Laterality: N/A;   SHOULDER ARTHROSCOPY Left 2004   Dr Liam   TONSILLECTOMY AND ADENOIDECTOMY     VASECTOMY     Patient Active Problem List   Diagnosis Date Noted   Parkinson's disease without dyskinesia or fluctuating manifestations (HCC) 04/08/2024   Fatigue 01/16/2024   Excessive daytime sleepiness 01/16/2024   Genetic testing 09/21/2021   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   Bradycardia 11/05/2019   Recurrent episodes Dizziness/Ataxia 11/05/2019   White matter abnormality on MRI of brain 04/16/2019   Migraine with aura and with status migrainosus, not intractable 04/16/2019   Ocular migraine 04/16/2019   Migraine aura without headache (migraine equivalents) 04/16/2019   ADD (attention deficit disorder) 10/02/2014   Insomnia 10/02/2014   Erectile dysfunction 10/02/2014   Osteoarthritis 10/02/2014   Eustachian tube dysfunction 10/02/2014   GERD (gastroesophageal reflux disease) 02/12/2013   Prostate cancer (HCC) 02/12/2013   Benign neoplasm of adrenal gland 11/30/2009   Hyperglycemia 11/30/2009   History of melanoma 11/30/2009   HYPERLIPIDEMIA 07/10/2007    ONSET DATE: April 2025   REFERRING DIAG: G20.A1 (ICD-10-CM) -  Parkinson's disease without dyskinesia or fluctuating manifestations (HCC)  THERAPY DIAG:  Unsteadiness on feet  Muscle weakness (generalized)  Other symptoms and signs involving the nervous system  Rationale for Evaluation and Treatment: Rehabilitation  SUBJECTIVE:                                                                                                                                                                                             SUBJECTIVE STATEMENT: It's just frustrating.  Almost cancelled today due to low BP measures.    Pt accompanied by: self  PERTINENT HISTORY: ADD,  prostate CA, HLD, lyme disease  PAIN:  Are you having pain? No  PRECAUTIONS: None  RED FLAGS: None   WEIGHT BEARING RESTRICTIONS: No  FALLS: Has patient fallen in last 6 months? No  LIVING ENVIRONMENT: Lives with: lives with their spouse Lives in: House/apartment Stairs: 1 step to enter; 1 story home Has following equipment at home: Single point cane, Tour manager, and Grab bars  PLOF: Independent, Vocation/Vocational requirements: retired, and Leisure: reading, outdoors, walking a mile after supper  PATIENT GOALS: I don't have any idea  OBJECTIVE:     TODAY'S TREATMENT: 05/13/24 Activity Comments  Sitting vitals:  112/73, HR 63 O2 98%   Standing 87/66, HR 77 bpm Feels fuzzy  Seated recumbent bike x 5 min Level 1.4, BLEs only  Vitals after exercise:  118/72   5x sit to stand:   18.97 sec      TUG: 12.81 sec         PATIENT EDUCATION: Education details: BP measures; discussed ways to incorporate seated exercise/supine exercise, good hydration; answered pt's questions about PD symptoms and Sinemet .  Continued to encourage pt to make appointment with PCP to medically manage hypotension  Person educated: Patient Education method: Explanation Education comprehension: verbalized understanding      HOME EXERCISE PROGRAM Access Code: [email or web address] URL: [email or web address] Date: 04/17/2024>04/24/2024  Prepared by: [facility] - Outpatient  Rehab - [facility]  Program Notes perform balance exercises in a corner for safety ADDED PWR! Moves in standing 10-20 reps daily  Exercises - Single Leg Heel Raise with Unilateral Counter Support  - 1 x daily - 5 x weekly - 2 sets - 10 reps - Squat with Chair Touch  - 1 x daily - 5 x weekly - 2 sets - 10 reps - Side Stepping with Resistance at Ankles and Counter Support  - 1 x daily - 5 x weekly - 2 sets - 1 min hold - Romberg Stance with Eyes Closed  - 1 x daily - 5 x weekly - 2-3 sets -  30 sec hold -  Romberg Stance with Head Rotation  - 1 x daily - 5 x weekly - 2-3 sets - 30 sec hold      Note: Objective measures were completed at Evaluation unless otherwise noted.  DIAGNOSTIC FINDINGS: 12/13/23 brain MRI: T2 FLAIR hyperintense signal changes within the cerebral white matter (moderate) and pons (mild), similar to the prior brain MRI of 05/10/2022. Findings are nonspecific and differential considerations include chronic small vessel ischemic disease, sequelae of a prior infectious/inflammatory process and sequelae of demyelinating disease, among others.   COGNITION: Overall cognitive status: Within functional limits for tasks assessed   SENSATION: Pt denies N/T in UEs/LEs  COORDINATION: Alternating pronation/supination: slightly slowed B Alternating toe tap: hypokinesia L LE Finger to nose: WNL B   MUSCLE TONE: rigidity in L LE, especially in ankle   POSTURE: rounded shoulders, forward head, and weight shift left  LOWER EXTREMITY ROM:     Active  Right Eval Left Eval  Hip flexion    Hip extension    Hip abduction    Hip adduction    Hip internal rotation    Hip external rotation    Knee flexion    Knee extension    Ankle dorsiflexion 10 3  Ankle plantarflexion    Ankle inversion    Ankle eversion     (Blank rows = not tested)  LOWER EXTREMITY MMT:    MMT (in sitting) Right Eval Left Eval  Hip flexion 4+ 4+  Hip extension    Hip abduction 3+ 3+  Hip adduction 4 4  Hip internal rotation    Hip external rotation    Knee flexion 4+ 4  Knee extension 4 4  Ankle dorsiflexion 5 5  Ankle plantarflexion 5 5  Ankle inversion    Ankle eversion    (Blank rows = not tested)  GAIT: Findings: Assistive device utilized:None, Level of assistance: Complete Independence, and Comments: reduced R arm swing  FUNCTIONAL TESTS:  5 times sit to stand: 18.42 sec without UEs 10 meter walk test: 9.21 sec  without AD  (3.56 ft/sec)                                                                                                                               TREATMENT DATE: 04/15/24     PATIENT EDUCATION: Education details: orthostatic hypotension -- monitoring, management and to get medical management Education method: Explanation, Demonstration, Tactile cues, Verbal cues, and Handouts Education comprehension: verbalized understanding  HOME EXERCISE PROGRAM: Not yet initiated   GOALS: Goals reviewed with patient? Yes  SHORT TERM GOALS: Target date: 05/06/2024  Patient to be independent with initial HEP. Baseline: HEP initiated; reports consistent performance of HEP, 05/13/2024 Goal status: IN PROGRESS    LONG TERM GOALS: Target date: 05/27/2024  Patient to be independent with advanced HEP. Baseline: Not yet initiated  Goal status: IN PROGRESS  Patient to verbalize understanding of local Parkinson's Disease  community resources including community fitness post D/C.   Baseline: Not yet initiated  Goal status: IN PROGRESS  Patient to improve MiniBestTest score to atleast 17-21 to decrease risk of falls.  Baseline: 19 04/17/24  Goal status: IN PROGRESS 04/17/24  Patient to demonstrate 50% improvement in R UE swing during gait.  Baseline: no UE swing Goal status: IN PROGRESS  Patient to demonstrate 5xSTS test in <15 sec in order to decrease risk of falls.   Baseline: 18 sec Goal status: IN PROGRESS  Patient to report participation in weekly exercise class. Baseline: Not yet initiated  Goal status: IN PROGRESS   ASSESSMENT:  CLINICAL IMPRESSION: Pt presents today and continues to report overall feeling weak, dizzy and lightheaded due to low BP measures.  He has not yet reached out to PCP, which was the recommendation of his neurologist.  BP measures drop from seated position upon starting therapy (112/73) to standing position (87/66).  After seated aerobic activity, did assess several objective measures, with 5x sit<>stand score of 18.97  slightly higher than eval time.  Pt is making slow progress towards LTGs, as BP measures dropping with positional changes have limited progression of intensity of PT exercises.  Pt agrees to contact PCP for further follow up.   OBJECTIVE IMPAIRMENTS: Abnormal gait, decreased activity tolerance, decreased balance, decreased coordination, decreased endurance, decreased ROM, decreased strength, impaired tone, and postural dysfunction.   ACTIVITY LIMITATIONS: carrying, lifting, bending, sitting, standing, squatting, stairs, transfers, bed mobility, bathing, dressing, reach over head, and hygiene/grooming  PARTICIPATION LIMITATIONS: meal prep, cleaning, laundry, community activity, yard work, and church  PERSONAL FACTORS: Age, Past/current experiences, Time since onset of injury/illness/exacerbation, and 3+ comorbidities: ADD, prostate CA, HLD, lyme disease are also affecting patient's functional outcome.   REHAB POTENTIAL: Good  CLINICAL DECISION MAKING: Evolving/moderate complexity  EVALUATION COMPLEXITY: Moderate  PLAN:  PT FREQUENCY: 1-2x/week  PT DURATION: 6 weeks  PLANNED INTERVENTIONS: 97164- PT Re-evaluation, 97750- Physical Performance Testing, 97110-Therapeutic exercises, 97530- Therapeutic activity, [id number]- Neuromuscular re-education, 97535- Self Care, 02859- Manual therapy, [id number]- Gait training, [phone]- Canalith repositioning, Patient/Family education, Balance training, Stair training, Taping, Vestibular training, Cryotherapy, and Moist heat  PLAN FOR NEXT SESSION:  Review PWR! Moves and progress (maybe work on forward/back step and recover); continue gait training with arm swing.  Continue hip quad/HS strengthening, balance with squatting and picking things up from floor, . EC balance, compliant surface, narrow BOS, and balance with head turns    Referring diagnosis? G20.A1 (ICD-10-CM) - Parkinson's disease without dyskinesia or fluctuating manifestations (HCC) Treatment diagnosis?  (if different than referring diagnosis) R26.81, M62.81, R29.818 What was this (referring dx) caused by? []  Surgery []  Fall [x]  Ongoing issue []  Arthritis []  Other: ____________  Laterality: []  Rt []  Lt [x]  Both  Check all possible CPT codes:  *CHOOSE 10 OR LESS*    See Planned Interventions listed in the Plan section of the Evaluation.     Greig Anon, PT 05/13/24 8:16 PM Phone: [phone] Fax: [phone]   [facility] at [facility] [address] [place], [address] [place], [place] [address] Phone # [phone] Fax # [phone]

## 2024-05-13 NOTE — Therapy (Signed)
 OUTPATIENT OCCUPATIONAL THERAPY PARKINSON'S  Treatment Note  Patient Name: Peter Novak MRN: [id number] DOB:08-09-1951, 73 y.o., male Today's Date: 05/13/2024  PCP: Katrinka Garnette KIDD, MD REFERRING PROVIDER: Evonnie Asberry RAMAN, DO  END OF SESSION:  OT End of Session - 05/13/24 1541     Visit Number 2    Number of Visits 8    Date for OT Re-Evaluation 06/07/24    Authorization Type [employer]    Authorization Time Period auth submitted    OT Start Time 1404    OT Stop Time 1446    OT Time Calculation (min) 42 min           Past Medical History:  Diagnosis Date   ADD (attention deficit disorder with hyperactivity)    Allergy    seasonal   Arthritis    Cancer (HCC)    prostate cancer   Cataract    bilateral,removed   Complication of anesthesia    Constipation    GERD (gastroesophageal reflux disease)    GERD (gastroesophageal reflux disease)    Hearing loss    History of skin cancer    Melanoma X 2   Hyperlipidemia    Lyme disease 01/2021   Prostate cancer (HCC) 2016   Past Surgical History:  Procedure Laterality Date   CATARACT EXTRACTION, BILATERAL  March 2022 and April 2022   CHOLECYSTECTOMY  2005   COLONOSCOPY  2008   negative   ELBOW SURGERY Right 1994   ulnar transposition   EYE SURGERY  2021   cataract   HERNIA REPAIR  2005   INNER EAR SURGERY  2006   Incus transposition;oscillculoplasty; Dr Thaddeus GRIST EAR SURGERY  2011   Stirrup resection   LYMPHADENECTOMY Bilateral 11/04/2021   Procedure: LYMPHADENECTOMY, PELVIC;  Surgeon: Renda Glance, MD;  Location: [facility];  Service: Urology;  Laterality: Bilateral;   POLYPECTOMY     ROBOT ASSISTED LAPAROSCOPIC RADICAL PROSTATECTOMY N/A 11/04/2021   Procedure: XI ROBOTIC ASSISTED LAPAROSCOPIC RADICAL PROSTATECTOMY LEVEL 3;  Surgeon: Renda Glance, MD;  Location: [facility];  Service: Urology;  Laterality: N/A;   SHOULDER ARTHROSCOPY Left 2004   Dr Liam   TONSILLECTOMY AND ADENOIDECTOMY      VASECTOMY     Patient Active Problem List   Diagnosis Date Noted   Parkinson's disease without dyskinesia or fluctuating manifestations (HCC) 04/08/2024   Fatigue 01/16/2024   Excessive daytime sleepiness 01/16/2024   Genetic testing 09/21/2021   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   Bradycardia 11/05/2019   Recurrent episodes Dizziness/Ataxia 11/05/2019   White matter abnormality on MRI of brain 04/16/2019   Migraine with aura and with status migrainosus, not intractable 04/16/2019   Ocular migraine 04/16/2019   Migraine aura without headache (migraine equivalents) 04/16/2019   ADD (attention deficit disorder) 10/02/2014   Insomnia 10/02/2014   Erectile dysfunction 10/02/2014   Osteoarthritis 10/02/2014   Eustachian tube dysfunction 10/02/2014   GERD (gastroesophageal reflux disease) 02/12/2013   Prostate cancer (HCC) 02/12/2013   Benign neoplasm of adrenal gland 11/30/2009   Hyperglycemia 11/30/2009   History of melanoma 11/30/2009   HYPERLIPIDEMIA 07/10/2007    ONSET DATE: referral date 04/16/24  REFERRING DIAG: R68.89 (ICD-10-CM) - Difficulty writing  THERAPY DIAG:  Other symptoms and signs involving the nervous system  Other lack of coordination  Muscle weakness (generalized)  Rationale for Evaluation and Treatment: Rehabilitation  SUBJECTIVE:   SUBJECTIVE STATEMENT: Pt reports his BP continues to be an ongoing problem.  Pt reports that he does  not feel like doing anything as he feels his BP fluctuate.  Pt accompanied by: self  PERTINENT HISTORY: ADD, prostate CA, HLD, lyme disease   PRECAUTIONS: None  WEIGHT BEARING RESTRICTIONS: No  PAIN:  Are you having pain? No  FALLS: Has patient fallen in last 6 months? No  LIVING ENVIRONMENT: Lives with: lives with their spouse Lives in: House/apartment Stairs: 1 step to enter; 1 story home Has following equipment at home: Tour manager, and Grab bars  PLOF: Independent, Vocation/Vocational  requirements: retired, and Leisure: reading, outdoors, walking a mile after supper  PATIENT GOALS: see if I can get better/extend working time on [employer]  OBJECTIVE:  Note: Objective measures were completed at Evaluation unless otherwise noted.  HAND DOMINANCE: Right  ADLs: Overall ADLs: it takes longer Transfers/ambulation related to ADLs: no AD Eating: difficulty with opening small cap on square milk cartons Grooming: Mod I, increased time when shaving UB Dressing: buttons can be difficult LB Dressing: occasional difficulty getting pants Toileting: Mod I Bathing: Mod I Tub Shower transfers: Mod I Equipment: Walk in shower and built in bench in shower  IADLs: Light housekeeping: Mod I, still cutting grass Meal Prep: Mod I Community mobility: Mod I Medication management: Mod I Financial management: Mod I Handwriting: 100% legible, Mild micrographia, and 12.60 sec (whales live in a blue ocean.)  MOBILITY STATUS: Independent  POSTURE COMMENTS:  rounded shoulders, forward head, and weight shift left  ACTIVITY TOLERANCE: Activity tolerance: diminished  FUNCTIONAL OUTCOME MEASURES: Fastening/unfastening 3 buttons: 23.53 sec Physical performance test: PPT#2 (simulated eating) 11.56 & PPT#4 (donning/doffing jacket): TBD  COORDINATION: 9 Hole Peg test: Right: 29.25 sec; Left: 28.69 sec Box and Blocks:  Right 49 blocks, Left 49 blocks Alternating pronation/supination: slightly slowed B Alternating toe tap: hypokinesia L LE Finger to nose: WNL B  UE ROM:  WFL  UE MMT:   WFL Grip strength: Right: 50 and 54# and Left: 60 and 65#  SENSATION: Pt denies N/T in UEs/LEs  COGNITION: Overall cognitive status: Within functional limits for tasks assessed  OBSERVATIONS: Bradykinesia                                                                                                                    TREATMENT DATE:  05/13/24 Large amplitude: engaged in large amplitude forward  reaching, reaching towards the floor, reaching overhead, and out to the side x10 each. OT providing demonstration, intermittent cues for amplitude and hand opening. Pt reports soreness in L shoulder afterwards.  OT educating in quality of movement and importance of large amplitude/opening as it pertains to dressing tasks and functional reach.  Transitioned to supine open book exercise with initial hand over hand to maintain positioning followed by mod verbal cues for positioning.  Shoulder ROM: engaged in vertical and diagonal reaching initially with dowel, progressing to use of red theraband for resistance and strengthening.  Pt completed in sitting progressing to standing for shoulder extension with band anchored above head.  Pt asking appropriate questions for technique  throughout and demonstrating good understanding of each.  Provided with handout.   04/25/24  Engaged in discussion of progression of PD and purpose of OT in decreasing impact of PD on ADLs and coordination tasks as well as providing adaptive techniques and strategies to increase quality of movements.   PATIENT EDUCATION: Education details: large amplitude and shoulder ROM Person educated: Patient Education method: Explanation Education comprehension: verbalized understanding and needs further education  HOME EXERCISE PROGRAM: Access Code: [facility] URL: [email or web address] Date: 05/13/2024 Prepared by: [facility] - Outpatient  Rehab - [facility]  Exercises - Seated Finger Flicks with Elbow Extension  - 7 x weekly - 1 sets - 10 reps - Seated Reach Forward, Up, and To Sides  - 7 x weekly - 1 sets - 10 reps - Open Book Chest Stretch on Towel Roll  - 7 x weekly - 1 sets - 10 reps - Shoulder Single Arm PNF D2 Flexion with Resistance  - 2 x daily - 3 x weekly - 2 sets - 10 reps - Seated Shoulder Flexion with Self-Anchored Resistance  - 2 x daily - 3 x weekly - 2 sets - 10 reps - Single Arm Shoulder Extension  with Anchored Resistance  - 2 x daily - 3 x weekly - 2 sets - 10 reps  GOALS: Goals reviewed with patient? Yes  SHORT TERM GOALS: Target date: 05/17/24  Pt will be Independent in PD specific HEP. Baseline: new PD diagnosis Goal status: in progress  2.  Pt will verbalize understanding of adapted strategies to maximize safety and Independence with ADLs/ IADLs . Baseline: difficulty with buttons, putting on pants, opening caps and bottles, wiping down shower Goal status: in progress  3.  Pt will verbalize and demonstrate improved quality of handwriting by writing a short paragraph with 100% legibility and no significant decrease in letter size. Baseline: mild micrographia Goal status: in progress  4.  Pt will verbalize increased tolerance to typing and use of adaptive strategies PRN to increase success with typing longer passages. Baseline: hand fatigue after ~5 mins of typing Goal status: in progress   LONG TERM GOALS: Target date: 06/07/24  Pt will demonstrate improved fine motor coordination for ADLs as evidenced by decreasing 9 hole peg test score for BUE by 3 secs Baseline: Right: 29.25 sec; Left: 28.69 sec Goal status: in progress  2.  Pt will demonstrate improved UE functional use for ADLs as evidenced by increasing box/ blocks score by 5 blocks with RUE/LUE Baseline: R: 49 and L: 49 blocks Goal status: in progress  3.  Pt will demonstrate improved ease with fastening buttons as evidenced by decreasing 3 button/ unbutton time to 20 secs or less Baseline: 23.53 sec Goal status: in progress  4.  Pt will verbalize understanding of ways to prevent future PD related complications and PD community resources. Baseline: new PD diagnosis Goal status: in progress   ASSESSMENT:  CLINICAL IMPRESSION: Patient is a 73 y.o. male who was seen today for occupational therapy evaluation for impairments in dexterity with handwriting/typing and ADLs due to PD.  Pt demonstrating decreased  shoulder ROM and opening during large amplitude exercises, benefiting from multimodal cues and education on quality of movement and carryover to functional tasks.  Pt pleasant and participatory throughout tasks. Pt will continue to benefit from skilled occupational therapy services to address strength and coordination, balance, GM/FM control,introduction of compensatory strategies/AE prn, and implementation of an HEP to improve participation and safety during ADLs, IADLs, and quality  of life.    PERFORMANCE DEFICITS: in functional skills including ADLs, IADLs, coordination, strength, Fine motor control, Gross motor control, body mechanics, endurance, decreased knowledge of precautions, and UE functional use and psychosocial skills including coping strategies and routines and behaviors.   IMPAIRMENTS: are limiting patient from ADLs, IADLs, and leisure.    PLAN:  OT FREQUENCY: 1-2x/week  OT DURATION: 6 weeks  PLANNED INTERVENTIONS: 97168 OT Re-evaluation, 97535 self care/ADL training, 02889 therapeutic exercise, 97530 therapeutic activity, 97112 neuromuscular re-education, 97750 Physical Performance Testing, functional mobility training, psychosocial skills training, energy conservation, coping strategies training, patient/family education, and DME and/or AE instructions  RECOMMENDED OTHER SERVICES: SLP eval  CONSULTED AND AGREED WITH PLAN OF CARE: Patient  PLAN FOR NEXT SESSION: initiate large amplitude HEP and coordination HEP, UB ROM to simulate wiping down shower/mirror, educate on community PD resources   Manor, LAURAINE, OTR/L 05/13/2024, 3:42 PM   [facility] at [facility] [address] [place], [place] [address] Phone # [phone] Fax # [phone]

## 2024-05-14 ENCOUNTER — Encounter: Payer: Self-pay | Admitting: Family Medicine

## 2024-05-15 ENCOUNTER — Encounter: Payer: Self-pay | Admitting: Physical Therapy

## 2024-05-15 ENCOUNTER — Ambulatory Visit: Admitting: Physical Therapy

## 2024-05-15 ENCOUNTER — Ambulatory Visit: Admitting: Occupational Therapy

## 2024-05-15 DIAGNOSIS — R471 Dysarthria and anarthria: Secondary | ICD-10-CM | POA: Diagnosis not present

## 2024-05-15 DIAGNOSIS — R278 Other lack of coordination: Secondary | ICD-10-CM

## 2024-05-15 DIAGNOSIS — R29818 Other symptoms and signs involving the nervous system: Secondary | ICD-10-CM

## 2024-05-15 DIAGNOSIS — R2689 Other abnormalities of gait and mobility: Secondary | ICD-10-CM | POA: Diagnosis not present

## 2024-05-15 DIAGNOSIS — R2681 Unsteadiness on feet: Secondary | ICD-10-CM

## 2024-05-15 DIAGNOSIS — M6281 Muscle weakness (generalized): Secondary | ICD-10-CM | POA: Diagnosis not present

## 2024-05-15 NOTE — Therapy (Signed)
OUTPATIENT PHYSICAL THERAPY NEURO TREATMENT   Patient Name: Peter Novak MRN: [id number] DOB:1951/07/07, 73 y.o., male Today's Date: 05/15/2024   PCP: Katrinka Garnette KIDD, MD  REFERRING PROVIDER: Evonnie Asberry RAMAN, DO  END OF SESSION:  PT End of Session - 05/15/24 1321     Visit Number 9    Number of Visits 13    Date for PT Re-Evaluation 05/27/24    Authorization Type [employer]    Authorization Time Period approved 8 visits from 04/15/2024 - 05/27/2024; 5 more visits approved 05/15/2024-06/05/2024    Authorization - Visit Number 1    Authorization - Number of Visits 5    PT Start Time 1318    PT Stop Time 1400    PT Time Calculation (min) 42 min    Activity Tolerance Patient tolerated treatment well    Behavior During Therapy WFL for tasks assessed/performed                 Past Medical History:  Diagnosis Date   ADD (attention deficit disorder with hyperactivity)    Allergy    seasonal   Arthritis    Cancer (HCC)    prostate cancer   Cataract    bilateral,removed   Complication of anesthesia    Constipation    GERD (gastroesophageal reflux disease)    GERD (gastroesophageal reflux disease)    Hearing loss    History of skin cancer    Melanoma X 2   Hyperlipidemia    Lyme disease 01/2021   Prostate cancer (HCC) 2016   Past Surgical History:  Procedure Laterality Date   CATARACT EXTRACTION, BILATERAL  March 2022 and April 2022   CHOLECYSTECTOMY  2005   COLONOSCOPY  2008   negative   ELBOW SURGERY Right 1994   ulnar transposition   EYE SURGERY  2021   cataract   HERNIA REPAIR  2005   INNER EAR SURGERY  2006   Incus transposition;oscillculoplasty; Dr Thaddeus GRIST EAR SURGERY  2011   Stirrup resection   LYMPHADENECTOMY Bilateral 11/04/2021   Procedure: LYMPHADENECTOMY, PELVIC;  Surgeon: Renda Glance, MD;  Location: [facility];  Service: Urology;  Laterality: Bilateral;   POLYPECTOMY     ROBOT ASSISTED LAPAROSCOPIC RADICAL PROSTATECTOMY N/A  11/04/2021   Procedure: XI ROBOTIC ASSISTED LAPAROSCOPIC RADICAL PROSTATECTOMY LEVEL 3;  Surgeon: Renda Glance, MD;  Location: [facility];  Service: Urology;  Laterality: N/A;   SHOULDER ARTHROSCOPY Left 2004   Dr Liam   TONSILLECTOMY AND ADENOIDECTOMY     VASECTOMY     Patient Active Problem List   Diagnosis Date Noted   Parkinson's disease without dyskinesia or fluctuating manifestations (HCC) 04/08/2024   Fatigue 01/16/2024   Excessive daytime sleepiness 01/16/2024   Genetic testing 09/21/2021   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   Bradycardia 11/05/2019   Recurrent episodes Dizziness/Ataxia 11/05/2019   White matter abnormality on MRI of brain 04/16/2019   Migraine with aura and with status migrainosus, not intractable 04/16/2019   Ocular migraine 04/16/2019   Migraine aura without headache (migraine equivalents) 04/16/2019   ADD (attention deficit disorder) 10/02/2014   Insomnia 10/02/2014   Erectile dysfunction 10/02/2014   Osteoarthritis 10/02/2014   Eustachian tube dysfunction 10/02/2014   GERD (gastroesophageal reflux disease) 02/12/2013   Prostate cancer (HCC) 02/12/2013   Benign neoplasm of adrenal gland 11/30/2009   Hyperglycemia 11/30/2009   History of melanoma 11/30/2009   HYPERLIPIDEMIA 07/10/2007    ONSET DATE: April 2025   REFERRING DIAG: G20.A1 (ICD-10-CM) -  Parkinson's disease without dyskinesia or fluctuating manifestations (HCC)  THERAPY DIAG:  Unsteadiness on feet  Muscle weakness (generalized)  Other abnormalities of gait and mobility  Other symptoms and signs involving the nervous system  Rationale for Evaluation and Treatment: Rehabilitation  SUBJECTIVE:                                                                                                                                                                                             SUBJECTIVE STATEMENT: I want to make sure we don't waste any of the sessions.  Pt accompanied  by: self  PERTINENT HISTORY: ADD, prostate CA, HLD, lyme disease  PAIN:  Are you having pain? No  PRECAUTIONS: None  RED FLAGS: None   WEIGHT BEARING RESTRICTIONS: No  FALLS: Has patient fallen in last 6 months? No  LIVING ENVIRONMENT: Lives with: lives with their spouse Lives in: House/apartment Stairs: 1 step to enter; 1 story home Has following equipment at home: Single point cane, Tour manager, and Grab bars  PLOF: Independent, Vocation/Vocational requirements: retired, and Leisure: reading, outdoors, walking a mile after supper  PATIENT GOALS: I don't have any idea  OBJECTIVE:    TODAY'S TREATMENT: 05/15/2024 Activity Comments  Vitals sitting 104/68, HR 57 bpm   NuStep, Level 4, 4 extremities x 6 minutes   PWR! Moves standing: PWR! UP x 10 PWR! Rock x 10 PWR! Twist x 10 PWR! Step x 10 Cues for full open hands, Eye boost Cues for arm/leg coordination  Showed pt video of PWR! Moves You Tube To help with recall/performance of HEP at home  Gait with arm swing-with and without bilat walking poles Cues for long strides and relaxed arm swing  134/79, HR 53  No c/o dizziness today       PATIENT EDUCATION: Education details: Review of PWR! Moves and provided link for YouTube PWR! Moves channel for improved performance at home Person educated: Patient Education method: Explanation Education comprehension: verbalized understanding      HOME EXERCISE PROGRAM Access Code: [email or web address] URL: [email or web address] Date: 04/17/2024>04/24/2024  Prepared by: [facility] - [facility]  Program Notes perform balance exercises in a corner for safety ADDED PWR! Moves in standing 10-20 reps daily  Exercises - Single Leg Heel Raise with Unilateral Counter Support  - 1 x daily - 5 x weekly - 2 sets - 10 reps - Squat with Chair Touch  - 1 x daily - 5 x weekly - 2 sets - 10 reps - Side Stepping with Resistance at Ankles and Counter  Support  - 1 x daily - 5 x weekly -  2 sets - 1 min hold - Romberg Stance with Eyes Closed  - 1 x daily - 5 x weekly - 2-3 sets - 30 sec hold - Romberg Stance with Head Rotation  - 1 x daily - 5 x weekly - 2-3 sets - 30 sec hold      Note: Objective measures were completed at Evaluation unless otherwise noted.  DIAGNOSTIC FINDINGS: 12/13/23 brain MRI: T2 FLAIR hyperintense signal changes within the cerebral white matter (moderate) and pons (mild), similar to the prior brain MRI of 05/10/2022. Findings are nonspecific and differential considerations include chronic small vessel ischemic disease, sequelae of a prior infectious/inflammatory process and sequelae of demyelinating disease, among others.   COGNITION: Overall cognitive status: Within functional limits for tasks assessed   SENSATION: Pt denies N/T in UEs/LEs  COORDINATION: Alternating pronation/supination: slightly slowed B Alternating toe tap: hypokinesia L LE Finger to nose: WNL B   MUSCLE TONE: rigidity in L LE, especially in ankle   POSTURE: rounded shoulders, forward head, and weight shift left  LOWER EXTREMITY ROM:     Active  Right Eval Left Eval  Hip flexion    Hip extension    Hip abduction    Hip adduction    Hip internal rotation    Hip external rotation    Knee flexion    Knee extension    Ankle dorsiflexion 10 3  Ankle plantarflexion    Ankle inversion    Ankle eversion     (Blank rows = not tested)  LOWER EXTREMITY MMT:    MMT (in sitting) Right Eval Left Eval  Hip flexion 4+ 4+  Hip extension    Hip abduction 3+ 3+  Hip adduction 4 4  Hip internal rotation    Hip external rotation    Knee flexion 4+ 4  Knee extension 4 4  Ankle dorsiflexion 5 5  Ankle plantarflexion 5 5  Ankle inversion    Ankle eversion    (Blank rows = not tested)  GAIT: Findings: Assistive device utilized:None, Level of assistance: Complete Independence, and Comments: reduced R arm swing  FUNCTIONAL  TESTS:  5 times sit to stand: 18.42 sec without UEs 10 meter walk test: 9.21 sec  without AD  (3.56 ft/sec)                                                                                                                              TREATMENT DATE: 04/15/24     PATIENT EDUCATION: Education details: orthostatic hypotension -- monitoring, management and to get medical management Education method: Explanation, Demonstration, Tactile cues, Verbal cues, and Handouts Education comprehension: verbalized understanding  HOME EXERCISE PROGRAM: Not yet initiated   GOALS: Goals reviewed with patient? Yes  SHORT TERM GOALS: Target date: 05/06/2024  Patient to be independent with initial HEP. Baseline: HEP initiated; reports consistent performance of HEP, 05/13/2024 Goal status: IN PROGRESS    LONG TERM GOALS: Target date: 05/27/2024  Patient  to be independent with advanced HEP. Baseline: Not yet initiated  Goal status: IN PROGRESS  Patient to verbalize understanding of local Parkinson's Disease community resources including community fitness post D/C.   Baseline: Not yet initiated  Goal status: IN PROGRESS  Patient to improve MiniBestTest score to atleast 17-21 to decrease risk of falls.  Baseline: 19 04/17/24  Goal status: IN PROGRESS 04/17/24  Patient to demonstrate 50% improvement in R UE swing during gait.  Baseline: no UE swing Goal status: IN PROGRESS  Patient to demonstrate 5xSTS test in <15 sec in order to decrease risk of falls.   Baseline: 18 sec Goal status: IN PROGRESS  Patient to report participation in weekly exercise class. Baseline: Not yet initiated  Goal status: IN PROGRESS   ASSESSMENT:  CLINICAL IMPRESSION: Pt presents today and had contacted PCP; recommendations were to push fluid and liberal salt intake, which pt has been doing.  Pt's initial BP measure is a little low but he does not report lightheaded symptoms like previous visits.  Able to work on initial  aerobic warm up + standing PWR! Moves + gait activities with no c/o dizziness throughout session.  He does stop several times during PWR! Moves, and expresses frustration at not being able to coordinate movement patterns well.  Reviewed the exercises and provided link for PWR! Moves You Tube for pt to reference at home for better recall and performance of HEP.  He will continue to benefit from skilled PT towards goals for improved functional mobility and decreased fall risk.  OBJECTIVE IMPAIRMENTS: Abnormal gait, decreased activity tolerance, decreased balance, decreased coordination, decreased endurance, decreased ROM, decreased strength, impaired tone, and postural dysfunction.   ACTIVITY LIMITATIONS: carrying, lifting, bending, sitting, standing, squatting, stairs, transfers, bed mobility, bathing, dressing, reach over head, and hygiene/grooming  PARTICIPATION LIMITATIONS: meal prep, cleaning, laundry, community activity, yard work, and church  PERSONAL FACTORS: Age, Past/current experiences, Time since onset of injury/illness/exacerbation, and 3+ comorbidities: ADD, prostate CA, HLD, lyme disease are also affecting patient's functional outcome.   REHAB POTENTIAL: Good  CLINICAL DECISION MAKING: Evolving/moderate complexity  EVALUATION COMPLEXITY: Moderate  PLAN:  PT FREQUENCY: 1-2x/week  PT DURATION: 6 weeks  PLANNED INTERVENTIONS: 97164- PT Re-evaluation, 97750- Physical Performance Testing, 97110-Therapeutic exercises, 97530- Therapeutic activity, [id number]- Neuromuscular re-education, 97535- Self Care, [id number]- Manual therapy, [id number]- Gait training, [phone]- Canalith repositioning, Patient/Family education, Balance training, Stair training, Taping, Vestibular training, Cryotherapy, and Moist heat  PLAN FOR NEXT SESSION:  LTGs need to be assessed prior to 9/29/ and then pt goes to beach for a week.  May need to look to recert to continue to incorporate large ampitude movement patterns and  considerations for community fitness plans.  Review PWR! Moves and progress (maybe work on forward/back step and recover); EC balance, compliant surface, narrow BOS, and balance with head turns    Referring diagnosis? G20.A1 (ICD-10-CM) - Parkinson's disease without dyskinesia or fluctuating manifestations (HCC) Treatment diagnosis? (if different than referring diagnosis) R26.81, M62.81, R29.818 What was this (referring dx) caused by? []  Surgery []  Fall [x]  Ongoing issue []  Arthritis []  Other: ____________  Laterality: []  Rt []  Lt [x]  Both  Check all possible CPT codes:  *CHOOSE 10 OR LESS*    See Planned Interventions listed in the Plan section of the Evaluation.     Greig Anon, PT 05/15/24 3:44 PM Phone: [phone] Fax: [phone]   [facility] at [facility] [address], [address] [place], [place] [address] Phone # [phone] Fax # [phone]  

## 2024-05-15 NOTE — Patient Instructions (Addendum)
 Coordination Exercises  Perform the following exercises for 15 minutes 2 times per day. Perform with both hand(s). Perform using big movements.  Flipping Cards: Place deck of cards on the table. Flip cards over by opening your hand big to grasp and then turn your palm up big. Deal cards: Hold 1/2 or whole deck in your hand. Use thumb to push card off top of deck with one big push.  Toss ball from one hand to the other: Toss big/high. Toss ball in the air and catch with the same hand: Toss big/high. Fasten nuts/bolts or put on bottle caps: Turn as much/as big as you can with each turn.  Pick up coins and place in coin bank or container: Pick up with big, intentional movements. Do not drag coin to the edge. Pick up coins and stack one at a time: Pick up with big, intentional movements. Do not drag coin to the edge. (5-10 in a stack) Pick up 5-10 coins one at a time and hold in palm. Then, move coins from palm to fingertips one at time and place in coin bank/container.   PWR! Hands  Then, start with elbows bent and hands closed, perform the following: PWR! Up: Close hands and flick fingers open and apart BIG PWR! Step: Touch index finger to thumb while keeping other fingers straight. Flick fingers out BIG (thumb out/straighten fingers). Repeat with other fingers. (Step your thumb to each finger). PWR! Hands: Push hands out BIG. Elbows straight, wrists up, fingers open and spread apart BIG.    ** Make each movement big and deliberate so that you feel the movement.  Perform at least 10 repetitions 1x/day, but perform PWR! hands throughout the day when you are having trouble using your hands (picking up/manipulating small objects, writing, eating, typing, sewing, buttoning, etc.).

## 2024-05-15 NOTE — Therapy (Signed)
 OUTPATIENT OCCUPATIONAL THERAPY [facility]  Treatment Note  Patient Name: Peter Novak MRN: [id number] DOB:02/26/1951, 73 y.o., male Today's Date: 05/15/2024  PCP: Katrinka Garnette KIDD, MD REFERRING PROVIDER: Evonnie Asberry RAMAN, DO  END OF SESSION:  OT End of Session - 05/15/24 1406     Visit Number 3    Number of Visits 8    Date for OT Re-Evaluation 06/07/24    Authorization Type [employer]    Authorization Time Period auth submitted    OT Start Time 1404    OT Stop Time 1444    OT Time Calculation (min) 40 min            Past Medical History:  Diagnosis Date   ADD (attention deficit disorder with hyperactivity)    Allergy    seasonal   Arthritis    Cancer (HCC)    prostate cancer   Cataract    bilateral,removed   Complication of anesthesia    Constipation    GERD (gastroesophageal reflux disease)    GERD (gastroesophageal reflux disease)    Hearing loss    History of skin cancer    Melanoma X 2   Hyperlipidemia    Lyme disease 01/2021   Prostate cancer (HCC) 2016   Past Surgical History:  Procedure Laterality Date   CATARACT EXTRACTION, BILATERAL  March 2022 and April 2022   CHOLECYSTECTOMY  2005   COLONOSCOPY  2008   negative   ELBOW SURGERY Right 1994   ulnar transposition   EYE SURGERY  2021   cataract   HERNIA REPAIR  2005   INNER EAR SURGERY  2006   Incus transposition;oscillculoplasty; Dr Thaddeus GRIST EAR SURGERY  2011   Stirrup resection   LYMPHADENECTOMY Bilateral 11/04/2021   Procedure: LYMPHADENECTOMY, PELVIC;  Surgeon: Renda Glance, MD;  Location: [facility];  Service: Urology;  Laterality: Bilateral;   POLYPECTOMY     ROBOT ASSISTED LAPAROSCOPIC RADICAL PROSTATECTOMY N/A 11/04/2021   Procedure: XI ROBOTIC ASSISTED LAPAROSCOPIC RADICAL PROSTATECTOMY LEVEL 3;  Surgeon: Renda Glance, MD;  Location: [facility];  Service: Urology;  Laterality: N/A;   SHOULDER ARTHROSCOPY Left 2004   Dr Liam   TONSILLECTOMY AND ADENOIDECTOMY      VASECTOMY     Patient Active Problem List   Diagnosis Date Noted   Parkinson's disease without dyskinesia or fluctuating manifestations (HCC) 04/08/2024   Fatigue 01/16/2024   Excessive daytime sleepiness 01/16/2024   Genetic testing 09/21/2021   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   Bradycardia 11/05/2019   Recurrent episodes Dizziness/Ataxia 11/05/2019   White matter abnormality on MRI of brain 04/16/2019   Migraine with aura and with status migrainosus, not intractable 04/16/2019   Ocular migraine 04/16/2019   Migraine aura without headache (migraine equivalents) 04/16/2019   ADD (attention deficit disorder) 10/02/2014   Insomnia 10/02/2014   Erectile dysfunction 10/02/2014   Osteoarthritis 10/02/2014   Eustachian tube dysfunction 10/02/2014   GERD (gastroesophageal reflux disease) 02/12/2013   Prostate cancer (HCC) 02/12/2013   Benign neoplasm of adrenal gland 11/30/2009   Hyperglycemia 11/30/2009   History of melanoma 11/30/2009   HYPERLIPIDEMIA 07/10/2007    ONSET DATE: referral date 04/16/24  REFERRING DIAG: R68.89 (ICD-10-CM) - Difficulty writing  THERAPY DIAG:  Muscle weakness (generalized)  Other symptoms and signs involving the nervous system  Other lack of coordination  Rationale for Evaluation and Treatment: Rehabilitation  SUBJECTIVE:   SUBJECTIVE STATEMENT: Pt reports that he loaded up on water  and gatorade and is not feeling any  lightheadedness today.    Pt accompanied by: self  PERTINENT HISTORY: ADD, prostate CA, HLD, lyme disease   PRECAUTIONS: None  WEIGHT BEARING RESTRICTIONS: No  PAIN:  Are you having pain? No  FALLS: Has patient fallen in last 6 months? No  LIVING ENVIRONMENT: Lives with: lives with their spouse Lives in: House/apartment Stairs: 1 step to enter; 1 story home Has following equipment at home: Tour manager, and Grab bars  PLOF: Independent, Vocation/Vocational requirements: retired, and Leisure: reading,  outdoors, walking a mile after supper  PATIENT GOALS: see if I can get better/extend working time on [employer]  OBJECTIVE:  Note: Objective measures were completed at Evaluation unless otherwise noted.  HAND DOMINANCE: Right  ADLs: Overall ADLs: it takes longer Transfers/ambulation related to ADLs: no AD Eating: difficulty with opening small cap on square milk cartons Grooming: Mod I, increased time when shaving UB Dressing: buttons can be difficult LB Dressing: occasional difficulty getting pants Toileting: Mod I Bathing: Mod I Tub Shower transfers: Mod I Equipment: Walk in shower and built in bench in shower  IADLs: Light housekeeping: Mod I, still cutting grass Meal Prep: Mod I Community mobility: Mod I Medication management: Mod I Financial management: Mod I Handwriting: 100% legible, Mild micrographia, and 12.60 sec (whales live in a blue ocean.)  MOBILITY STATUS: Independent  POSTURE COMMENTS:  rounded shoulders, forward head, and weight shift left  ACTIVITY TOLERANCE: Activity tolerance: diminished  FUNCTIONAL OUTCOME MEASURES: Fastening/unfastening 3 buttons: 23.53 sec Physical performance test: PPT#2 (simulated eating) 11.56 & PPT#4 (donning/doffing jacket): TBD  COORDINATION: 9 Hole Peg test: Right: 29.25 sec; Left: 28.69 sec Box and Blocks:  Right 49 blocks, Left 49 blocks Alternating pronation/supination: slightly slowed B Alternating toe tap: hypokinesia L LE Finger to nose: WNL B  UE ROM:  WFL  UE MMT:   WFL Grip strength: Right: 50 and 54# and Left: 60 and 65#  SENSATION: Pt denies N/T in UEs/LEs  COGNITION: Overall cognitive status: Within functional limits for tasks assessed  OBSERVATIONS: Bradykinesia                                                                                                                    TREATMENT DATE:  05/15/24 Large amplitude: engaged in finger flicks with full hand opening and thumb opposition with finger  extension. OT educating on completion daily as well as prior to [facility] tasks as needed for amplitude. Coordination:  OT providing demonstration and intermittent cues for amplitude during exercises.  Pt with decreased coordination and speed on L > R. Flipping Cards: Place deck of cards on the table. Flip cards over by opening your hand big to grasp and then turn your palm up big. Deal cards: Hold 1/2 or whole deck in your hand. Use thumb to push card off top of deck with one big push. Toss ball from one hand to the other: Toss big/high. Toss ball in the air and catch with the same hand: Toss big/high. Fasten nuts/bolts or put on bottle  caps: Turn as much/as big as you can with each turn. Pick up coins and place in coin bank or container: Pick up with big, intentional movements. Do not drag coin to the edge. Pick up coins and stack one at a time: Pick up with big, intentional movements. Do not drag coin to the edge. (5-10 in a stack) Pick up coins and rotate 360*: pick up with big, intentional movements.   Pick up 5-10 coins one at a time and hold in palm. Then, move coins from palm to fingertips one at time and place in coin bank/container.   05/13/24 Large amplitude: engaged in large amplitude forward reaching, reaching towards the floor, reaching overhead, and out to the side x10 each. OT providing demonstration, intermittent cues for amplitude and hand opening. Pt reports soreness in L shoulder afterwards.  OT educating in quality of movement and importance of large amplitude/opening as it pertains to dressing tasks and functional reach.  Transitioned to supine open book exercise with initial hand over hand to maintain positioning followed by mod verbal cues for positioning.  Shoulder ROM: engaged in vertical and diagonal reaching initially with dowel, progressing to use of red theraband for resistance and strengthening.  Pt completed in sitting progressing to standing for shoulder extension with band  anchored above head.  Pt asking appropriate questions for technique throughout and demonstrating good understanding of each.  Provided with handout.   04/25/24  Engaged in discussion of progression of PD and purpose of OT in decreasing impact of PD on ADLs and coordination tasks as well as providing adaptive techniques and strategies to increase quality of movements.   PATIENT EDUCATION: Education details: large amplitude and coordination HEP Person educated: Patient Education method: Explanation Education comprehension: verbalized understanding and needs further education  HOME EXERCISE PROGRAM: Access Code: [facility] URL: [email or web address] Date: 05/13/2024 Prepared by: [facility] - Outpatient  Rehab - [facility]  Exercises - Seated Finger Flicks with Elbow Extension  - 7 x weekly - 1 sets - 10 reps - Seated Reach Forward, Up, and To Sides  - 7 x weekly - 1 sets - 10 reps - Open Book Chest Stretch on Towel Roll  - 7 x weekly - 1 sets - 10 reps - Shoulder Single Arm PNF D2 Flexion with Resistance  - 2 x daily - 3 x weekly - 2 sets - 10 reps - Seated Shoulder Flexion with Self-Anchored Resistance  - 2 x daily - 3 x weekly - 2 sets - 10 reps - Single Arm Shoulder Extension with Anchored Resistance  - 2 x daily - 3 x weekly - 2 sets - 10 reps  GOALS: Goals reviewed with patient? Yes  SHORT TERM GOALS: Target date: 05/17/24  Pt will be Independent in PD specific HEP. Baseline: new PD diagnosis Goal status: in progress  2.  Pt will verbalize understanding of adapted strategies to maximize safety and Independence with ADLs/ IADLs . Baseline: difficulty with buttons, putting on pants, opening caps and bottles, wiping down shower Goal status: in progress  3.  Pt will verbalize and demonstrate improved quality of handwriting by writing a short paragraph with 100% legibility and no significant decrease in letter size. Baseline: mild micrographia Goal status: in  progress  4.  Pt will verbalize increased tolerance to typing and use of adaptive strategies PRN to increase success with typing longer passages. Baseline: hand fatigue after ~5 mins of typing Goal status: in progress   LONG TERM GOALS: Target date: 06/07/24  Pt will demonstrate improved fine motor coordination for ADLs as evidenced by decreasing 9 hole peg test score for BUE by 3 secs Baseline: Right: 29.25 sec; Left: 28.69 sec Goal status: in progress  2.  Pt will demonstrate improved UE functional use for ADLs as evidenced by increasing box/ blocks score by 5 blocks with RUE/LUE Baseline: R: 49 and L: 49 blocks Goal status: in progress  3.  Pt will demonstrate improved ease with fastening buttons as evidenced by decreasing 3 button/ unbutton time to 20 secs or less Baseline: 23.53 sec Goal status: in progress  4.  Pt will verbalize understanding of ways to prevent future PD related complications and PD community resources. Baseline: new PD diagnosis Goal status: in progress   ASSESSMENT:  CLINICAL IMPRESSION: Patient is a 73 y.o. male who was seen today for occupational therapy treatment for impairments in dexterity with handwriting/typing and ADLs due to PD.  Pt benefiting from multimodal cues and education on quality of movement, especially with fine motor tasks, and carryover to functional tasks.  Pt pleasant and participatory throughout tasks. Pt demonstrating decreased coordination and amplitude on L > R UE.  Pt will continue to benefit from skilled occupational therapy services to address strength and coordination, balance, GM/FM control,introduction of compensatory strategies/AE prn, and implementation of an HEP to improve participation and safety during ADLs, IADLs, and quality of life.    PERFORMANCE DEFICITS: in functional skills including ADLs, IADLs, coordination, strength, Fine motor control, Gross motor control, body mechanics, endurance, decreased knowledge of  precautions, and UE functional use and psychosocial skills including coping strategies and routines and behaviors.   IMPAIRMENTS: are limiting patient from ADLs, IADLs, and leisure.    PLAN:  OT FREQUENCY: 1-2x/week  OT DURATION: 6 weeks  PLANNED INTERVENTIONS: 97168 OT Re-evaluation, 97535 self care/ADL training, 02889 therapeutic exercise, 97530 therapeutic activity, 97112 neuromuscular re-education, 97750 Physical Performance Testing, functional mobility training, psychosocial skills training, energy conservation, coping strategies training, patient/family education, and DME and/or AE instructions  RECOMMENDED OTHER SERVICES: SLP eval  CONSULTED AND AGREED WITH PLAN OF CARE: Patient  PLAN FOR NEXT SESSION: Review large amplitude HEP and coordination HEP as needed, UB ROM to simulate wiping down shower/mirror, educate on community PD resources  Handwriting and typing   KAYLENE DOMINO, OTR/L 05/15/2024, 2:22 PM   [facility] at [facility] [address] [place], [place] [address] Phone # [phone] Fax # [phone]

## 2024-05-21 ENCOUNTER — Ambulatory Visit: Admitting: Physical Therapy

## 2024-05-21 ENCOUNTER — Encounter: Payer: Self-pay | Admitting: Physical Therapy

## 2024-05-21 ENCOUNTER — Ambulatory Visit: Admitting: Occupational Therapy

## 2024-05-21 DIAGNOSIS — R278 Other lack of coordination: Secondary | ICD-10-CM | POA: Diagnosis not present

## 2024-05-21 DIAGNOSIS — R2689 Other abnormalities of gait and mobility: Secondary | ICD-10-CM | POA: Diagnosis not present

## 2024-05-21 DIAGNOSIS — R2681 Unsteadiness on feet: Secondary | ICD-10-CM | POA: Diagnosis not present

## 2024-05-21 DIAGNOSIS — R471 Dysarthria and anarthria: Secondary | ICD-10-CM | POA: Diagnosis not present

## 2024-05-21 DIAGNOSIS — R29818 Other symptoms and signs involving the nervous system: Secondary | ICD-10-CM | POA: Diagnosis not present

## 2024-05-21 DIAGNOSIS — M6281 Muscle weakness (generalized): Secondary | ICD-10-CM | POA: Diagnosis not present

## 2024-05-21 NOTE — Therapy (Signed)
 OUTPATIENT PHYSICAL THERAPY NEURO TREATMENT/10th VISIT PROGRESS NOTE/RECERT   Patient Name: Peter Novak MRN: [id number] DOB:12/08/1950, 73 y.o., male Today's Date: 05/21/2024   PCP: Katrinka Garnette KIDD, MD  REFERRING PROVIDER: Evonnie Asberry RAMAN, DO  Progress Note Reporting Period 04/15/2024 to 05/21/24  See note below for Objective Data and Assessment of Progress/Goals.      END OF SESSION:  PT End of Session - 05/21/24 1318     Visit Number 10    Number of Visits 21    Date for Recertification  06/28/24   per recert 05/21/2024   Authorization Type [employer]    Authorization Time Period approved 8 visits from 04/15/2024 - 05/27/2024; 5 more visits approved 05/15/2024-06/05/2024    Authorization - Visit Number 2    Authorization - Number of Visits 5    PT Start Time 1320    PT Stop Time 1400    PT Time Calculation (min) 40 min    Activity Tolerance Patient tolerated treatment well    Behavior During Therapy WFL for tasks assessed/performed                  Past Medical History:  Diagnosis Date   ADD (attention deficit disorder with hyperactivity)    Allergy    seasonal   Arthritis    Cancer (HCC)    prostate cancer   Cataract    bilateral,removed   Complication of anesthesia    Constipation    GERD (gastroesophageal reflux disease)    GERD (gastroesophageal reflux disease)    Hearing loss    History of skin cancer    Melanoma X 2   Hyperlipidemia    Lyme disease 01/2021   Prostate cancer (HCC) 2016   Past Surgical History:  Procedure Laterality Date   CATARACT EXTRACTION, BILATERAL  March 2022 and April 2022   CHOLECYSTECTOMY  2005   COLONOSCOPY  2008   negative   ELBOW SURGERY Right 1994   ulnar transposition   EYE SURGERY  2021   cataract   HERNIA REPAIR  2005   INNER EAR SURGERY  2006   Incus transposition;oscillculoplasty; Dr Thaddeus GRIST EAR SURGERY  2011   Stirrup resection   LYMPHADENECTOMY Bilateral 11/04/2021   Procedure:  LYMPHADENECTOMY, PELVIC;  Surgeon: Renda Glance, MD;  Location: [facility];  Service: Urology;  Laterality: Bilateral;   POLYPECTOMY     ROBOT ASSISTED LAPAROSCOPIC RADICAL PROSTATECTOMY N/A 11/04/2021   Procedure: XI ROBOTIC ASSISTED LAPAROSCOPIC RADICAL PROSTATECTOMY LEVEL 3;  Surgeon: Renda Glance, MD;  Location: [facility];  Service: Urology;  Laterality: N/A;   SHOULDER ARTHROSCOPY Left 2004   Dr Liam   TONSILLECTOMY AND ADENOIDECTOMY     VASECTOMY     Patient Active Problem List   Diagnosis Date Noted   Parkinson's disease without dyskinesia or fluctuating manifestations (HCC) 04/08/2024   Fatigue 01/16/2024   Excessive daytime sleepiness 01/16/2024   Genetic testing 09/21/2021   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   Bradycardia 11/05/2019   Recurrent episodes Dizziness/Ataxia 11/05/2019   White matter abnormality on MRI of brain 04/16/2019   Migraine with aura and with status migrainosus, not intractable 04/16/2019   Ocular migraine 04/16/2019   Migraine aura without headache (migraine equivalents) 04/16/2019   ADD (attention deficit disorder) 10/02/2014   Insomnia 10/02/2014   Erectile dysfunction 10/02/2014   Osteoarthritis 10/02/2014   Eustachian tube dysfunction 10/02/2014   GERD (gastroesophageal reflux disease) 02/12/2013   Prostate cancer (HCC) 02/12/2013   Benign neoplasm  of adrenal gland 11/30/2009   Hyperglycemia 11/30/2009   History of melanoma 11/30/2009   HYPERLIPIDEMIA 07/10/2007    ONSET DATE: April 2025   REFERRING DIAG: G20.A1 (ICD-10-CM) - Parkinson's disease without dyskinesia or fluctuating manifestations (HCC)  THERAPY DIAG:  Other symptoms and signs involving the nervous system  Unsteadiness on feet  Other abnormalities of gait and mobility  Rationale for Evaluation and Treatment: Rehabilitation  SUBJECTIVE:                                                                                                                                                                                              SUBJECTIVE STATEMENT: BP measures have been a little off.  Been trying to push fluids.  I'm trying to incorporate exercises into daily activities.  Want to be able to transition to community fitness options.  Pt accompanied by: self  PERTINENT HISTORY: ADD, prostate CA, HLD, lyme disease  PAIN:  Are you having pain? No  PRECAUTIONS: None  RED FLAGS: None   WEIGHT BEARING RESTRICTIONS: No  FALLS: Has patient fallen in last 6 months? No  LIVING ENVIRONMENT: Lives with: lives with their spouse Lives in: House/apartment Stairs: 1 step to enter; 1 story home Has following equipment at home: Single point cane, Tour manager, and Grab bars  PLOF: Independent, Vocation/Vocational requirements: retired, and Leisure: reading, outdoors, walking a mile after supper  PATIENT GOALS: I don't have any idea  OBJECTIVE:    TODAY'S TREATMENT: 05/21/2024 Activity Comments  130/75, HR 55 Initial vitals  NuStep Level 4, 4 extremities x 8 minutes 30 second bouts of >80 SPM; self-selected pace is 70-75  FTSTS:  17.5 sec  Compared to 18 sec at eval  TUG  9.4 sec TUG cognitive:  11.25 sec   3 M walk backwards: 4.91 sec   MiniBESTest:  24/28 Improved from 18/28  Side step and recover Back step and recover Cues for increased amplitude, increased intention of stepping  No c/o dizziness during activities in PT session    PATIENT EDUCATION: Education details: Progress towards goals; POC Person educated: Patient Education method: Explanation Education comprehension: verbalized understanding      HOME EXERCISE PROGRAM Access Code: [email or web address] URL: [email or web address] Date: 04/17/2024>04/24/2024  Prepared by: [facility] - Outpatient  Rehab - [facility]  Program Notes perform balance exercises in a corner for safety ADDED PWR! Moves in standing 10-20 reps daily  Exercises - Single Leg Heel Raise with  Unilateral Counter Support  - 1 x daily - 5 x weekly - 2 sets - 10 reps - Squat with Chair Touch  - 1 x daily -  5 x weekly - 2 sets - 10 reps - Side Stepping with Resistance at Ankles and Counter Support  - 1 x daily - 5 x weekly - 2 sets - 1 min hold - Romberg Stance with Eyes Closed  - 1 x daily - 5 x weekly - 2-3 sets - 30 sec hold - Romberg Stance with Head Rotation  - 1 x daily - 5 x weekly - 2-3 sets - 30 sec hold      Note: Objective measures were completed at Evaluation unless otherwise noted.  DIAGNOSTIC FINDINGS: 12/13/23 brain MRI: T2 FLAIR hyperintense signal changes within the cerebral white matter (moderate) and pons (mild), similar to the prior brain MRI of 05/10/2022. Findings are nonspecific and differential considerations include chronic small vessel ischemic disease, sequelae of a prior infectious/inflammatory process and sequelae of demyelinating disease, among others.   COGNITION: Overall cognitive status: Within functional limits for tasks assessed   SENSATION: Pt denies N/T in UEs/LEs  COORDINATION: Alternating pronation/supination: slightly slowed B Alternating toe tap: hypokinesia L LE Finger to nose: WNL B   MUSCLE TONE: rigidity in L LE, especially in ankle   POSTURE: rounded shoulders, forward head, and weight shift left  LOWER EXTREMITY ROM:     Active  Right Eval Left Eval  Hip flexion    Hip extension    Hip abduction    Hip adduction    Hip internal rotation    Hip external rotation    Knee flexion    Knee extension    Ankle dorsiflexion 10 3  Ankle plantarflexion    Ankle inversion    Ankle eversion     (Blank rows = not tested)  LOWER EXTREMITY MMT:    MMT (in sitting) Right Eval Left Eval  Hip flexion 4+ 4+  Hip extension    Hip abduction 3+ 3+  Hip adduction 4 4  Hip internal rotation    Hip external rotation    Knee flexion 4+ 4  Knee extension 4 4  Ankle dorsiflexion 5 5  Ankle plantarflexion 5 5  Ankle  inversion    Ankle eversion    (Blank rows = not tested)  GAIT: Findings: Assistive device utilized:None, Level of assistance: Complete Independence, and Comments: reduced R arm swing  FUNCTIONAL TESTS:  5 times sit to stand: 18.42 sec without UEs 10 meter walk test: 9.21 sec  without AD  (3.56 ft/sec)                                                                                                                              TREATMENT DATE: 04/15/24     PATIENT EDUCATION: Education details: orthostatic hypotension -- monitoring, management and to get medical management Education method: Explanation, Demonstration, Tactile cues, Verbal cues, and Handouts Education comprehension: verbalized understanding  HOME EXERCISE PROGRAM: Not yet initiated   GOALS: Goals reviewed with patient? Yes  SHORT TERM GOALS: Target date: 05/06/2024  Patient to  be independent with initial HEP. Baseline: HEP initiated; reports consistent performance of HEP, 05/13/2024 Goal status: IN PROGRESS    LONG TERM GOALS: Target date: 05/27/2024>>UPDATED TARGET 06/28/2024  Patient to be independent with advanced HEP. Baseline: Needs to be updated, 05/21/24 Goal status: IN PROGRESS  Patient to verbalize understanding of local Parkinson's Disease community resources including community fitness post D/C.   Baseline: Not yet initiated  Goal status: IN PROGRESS  Patient to improve MiniBestTest score to atleast 17-21 to decrease risk of falls.  Baseline: 19 04/17/24 >24/28 05/21/2024 Goal status: MET 05/21/24  Patient to demonstrate 50% improvement in R UE swing during gait.  Baseline: decreased UE swing Goal status: IN PROGRESS  Patient to demonstrate 5xSTS test in <15 sec in order to decrease risk of falls.   Baseline: 18 sec>17.5 sec Goal status: IN PROGRESS9/23/2025  Patient to report participation in weekly exercise class. Baseline: Not yet initiated  Goal status: IN PROGRESS  TUG/TUG Cognitive score  to be < or equal to 10% difference for improved dual task with gait. Baseline: 9.4, 11.25 sec Goal status:  INITIAL  Push and release test backwards and lateral, to recover in 1-2 steps. Baseline:  3-4 steps Goal status:  INITIAL   ASSESSMENT:  CLINICAL IMPRESSION: 10th Visit PN:  Pt presents today and feels good, BP measures are WFL. Subjectively, pt reports he understands why all these exercises are important.  Skilled PT session focused on assessing objective measures and addressing reactive balance.   MiniBESTest score 24/28, improved from 19.  He does have >10% difference in TUG/TUG cognitive; he does require >1 step for balance recovery in backward and side step reactive balance.  He has not yet initiated community fitness, but is interested in pursuing this.  He has met MiniBESTest goal; remaining objective measures not yet fully met, as pt has had concerns about his lowered BP measures, so we have not focused heavily on balance and repeated sit to stand measures.  Recert completed and goals updated.  He will continue to benefit from skilled PT towards goals for improved functional mobility and decreased fall risk.  OBJECTIVE IMPAIRMENTS: Abnormal gait, decreased activity tolerance, decreased balance, decreased coordination, decreased endurance, decreased ROM, decreased strength, impaired tone, and postural dysfunction.   ACTIVITY LIMITATIONS: carrying, lifting, bending, sitting, standing, squatting, stairs, transfers, bed mobility, bathing, dressing, reach over head, and hygiene/grooming  PARTICIPATION LIMITATIONS: meal prep, cleaning, laundry, community activity, yard work, and church  PERSONAL FACTORS: Age, Past/current experiences, Time since onset of injury/illness/exacerbation, and 3+ comorbidities: ADD, prostate CA, HLD, lyme disease are also affecting patient's functional outcome.   REHAB POTENTIAL: Good  CLINICAL DECISION MAKING: Evolving/moderate complexity  EVALUATION  COMPLEXITY: Moderate  PLAN:  PT FREQUENCY: 1-2x/week  PT DURATION: 6 weeks per recert 05/21/2024  PLANNED INTERVENTIONS: 97164- PT Re-evaluation, 97750- Physical Performance Testing, 97110-Therapeutic exercises, 97530- Therapeutic activity, 97112- Neuromuscular re-education, 97535- Self Care, 02859- Manual therapy, [phone]- Gait training, [phone]- Canalith repositioning, Patient/Family education, Balance training, Stair training, Taping, Vestibular training, Cryotherapy, and Moist heat  PLAN FOR NEXT SESSION:  Continue to incorporate large ampitude movement patterns (add to HEP-forward and back step and recover balance); considerations for community fitness plans.  Review PWR! Moves and progress-maybe FLOW; EC balance, compliant surface, narrow BOS, and balance with head turns    Referring diagnosis? G20.A1 (ICD-10-CM) - Parkinson's disease without dyskinesia or fluctuating manifestations (HCC) Treatment diagnosis? (if different than referring diagnosis) R26.81, M62.81, R29.818 What was this (referring dx) caused by? []  Surgery []   Fall [x]  Ongoing issue []  Arthritis []  Other: ____________  Laterality: []  Rt []  Lt [x]  Both  Check all possible CPT codes:  *CHOOSE 10 OR LESS*    See Planned Interventions listed in the Plan section of the Evaluation.     Greig Anon, PT 05/21/24 2:12 PM Phone: [phone] Fax: [phone]   [facility] [address] [place], [address] [place], [place] [address] Phone # [phone] Fax # [phone]

## 2024-05-21 NOTE — Therapy (Signed)
 OUTPATIENT OCCUPATIONAL THERAPY [facility]  Treatment Note  Patient Name: Peter Novak MRN: [id number] DOB:[birth year], 73 y.o., male Today's Date: 05/21/2024  PCP: Katrinka Garnette KIDD, MD REFERRING PROVIDER: Evonnie Asberry RAMAN, DO  END OF SESSION:  OT End of Session - 05/21/24 1458     Visit Number 4    Number of Visits 8    Date for Recertification  06/07/24    Authorization Type [employer]    Authorization Time Period auth submitted    OT Start Time 1402    OT Stop Time 1447    OT Time Calculation (min) 45 min             Past Medical History:  Diagnosis Date   ADD (attention deficit disorder with hyperactivity)    Allergy    seasonal   Arthritis    Cancer (HCC)    prostate cancer   Cataract    bilateral,removed   Complication of anesthesia    Constipation    GERD (gastroesophageal reflux disease)    GERD (gastroesophageal reflux disease)    Hearing loss    History of skin cancer    Melanoma X 2   Hyperlipidemia    Lyme disease 01/2021   Prostate cancer (HCC) 2016   Past Surgical History:  Procedure Laterality Date   CATARACT EXTRACTION, BILATERAL  March 2022 and April 2022   CHOLECYSTECTOMY  2005   COLONOSCOPY  2008   negative   ELBOW SURGERY Right 1994   ulnar transposition   EYE SURGERY  2021   cataract   HERNIA REPAIR  2005   INNER EAR SURGERY  2006   Incus transposition;oscillculoplasty; Dr Thaddeus GRIST EAR SURGERY  2011   Stirrup resection   LYMPHADENECTOMY Bilateral 11/04/2021   Procedure: LYMPHADENECTOMY, PELVIC;  Surgeon: Renda Glance, MD;  Location: [facility];  Service: Urology;  Laterality: Bilateral;   POLYPECTOMY     ROBOT ASSISTED LAPAROSCOPIC RADICAL PROSTATECTOMY N/A 11/04/2021   Procedure: XI ROBOTIC ASSISTED LAPAROSCOPIC RADICAL PROSTATECTOMY LEVEL 3;  Surgeon: Renda Glance, MD;  Location: [facility];  Service: Urology;  Laterality: N/A;   SHOULDER ARTHROSCOPY Left 2004   Dr Liam   TONSILLECTOMY AND ADENOIDECTOMY      VASECTOMY     Patient Active Problem List   Diagnosis Date Noted   Parkinson's disease without dyskinesia or fluctuating manifestations (HCC) 04/08/2024   Fatigue 01/16/2024   Excessive daytime sleepiness 01/16/2024   Genetic testing 09/21/2021   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   Bradycardia 11/05/2019   Recurrent episodes Dizziness/Ataxia 11/05/2019   White matter abnormality on MRI of brain 04/16/2019   Migraine with aura and with status migrainosus, not intractable 04/16/2019   Ocular migraine 04/16/2019   Migraine aura without headache (migraine equivalents) 04/16/2019   ADD (attention deficit disorder) 10/02/2014   Insomnia 10/02/2014   Erectile dysfunction 10/02/2014   Osteoarthritis 10/02/2014   Eustachian tube dysfunction 10/02/2014   GERD (gastroesophageal reflux disease) 02/12/2013   Prostate cancer (HCC) 02/12/2013   Benign neoplasm of adrenal gland 11/30/2009   Hyperglycemia 11/30/2009   History of melanoma 11/30/2009   HYPERLIPIDEMIA 07/10/2007    ONSET DATE: referral date 04/16/24  REFERRING DIAG: R68.89 (ICD-10-CM) - Difficulty writing  THERAPY DIAG:  Other symptoms and signs involving the nervous system  Other lack of coordination  Rationale for Evaluation and Treatment: Rehabilitation  SUBJECTIVE:   SUBJECTIVE STATEMENT: Pt reports that he did not go to the PD Symposium as he chickened out day of.  Pt accompanied by: self  PERTINENT HISTORY: ADD, prostate CA, HLD, lyme disease   PRECAUTIONS: None  WEIGHT BEARING RESTRICTIONS: No  PAIN:  Are you having pain? No  FALLS: Has patient fallen in last 6 months? No  LIVING ENVIRONMENT: Lives with: lives with their spouse Lives in: House/apartment Stairs: 1 step to enter; 1 story home Has following equipment at home: Tour manager, and Grab bars  PLOF: Independent, Vocation/Vocational requirements: retired, and Leisure: reading, outdoors, walking a mile after supper  PATIENT GOALS: see  if I can get better/extend working time on [employer]  OBJECTIVE:  Note: Objective measures were completed at Evaluation unless otherwise noted.  HAND DOMINANCE: Right  ADLs: Overall ADLs: it takes longer Transfers/ambulation related to ADLs: no AD Eating: difficulty with opening small cap on square milk cartons Grooming: Mod I, increased time when shaving UB Dressing: buttons can be difficult LB Dressing: occasional difficulty getting pants Toileting: Mod I Bathing: Mod I Tub Shower transfers: Mod I Equipment: Walk in shower and built in bench in shower  IADLs: Light housekeeping: Mod I, still cutting grass Meal Prep: Mod I Community mobility: Mod I Medication management: Mod I Financial management: Mod I Handwriting: 100% legible, Mild micrographia, and 12.60 sec (whales live in a blue ocean.)  MOBILITY STATUS: Independent  POSTURE COMMENTS:  rounded shoulders, forward head, and weight shift left  ACTIVITY TOLERANCE: Activity tolerance: diminished  FUNCTIONAL OUTCOME MEASURES: Fastening/unfastening 3 buttons: 23.53 sec Physical performance test: PPT#2 (simulated eating) 11.56 & PPT#4 (donning/doffing jacket): TBD  COORDINATION: 9 Hole Peg test: Right: 29.25 sec; Left: 28.69 sec Box and Blocks:  Right 49 blocks, Left 49 blocks Alternating pronation/supination: slightly slowed B Alternating toe tap: hypokinesia L LE Finger to nose: WNL B  UE ROM:  WFL  UE MMT:   WFL Grip strength: Right: 50 and 54# and Left: 60 and 65#  SENSATION: Pt denies N/T in UEs/LEs  COGNITION: Overall cognitive status: Within functional limits for tasks assessed  OBSERVATIONS: Bradykinesia                                                                                                                    TREATMENT DATE:  05/21/24 Large amplitude: engaged in finger flicks with full hand opening and thumb opposition with finger extension. OT providing initial cue, pt then completing with good  amplitude. Handwriting: provided with handout of recommendations from [facility] to aid in legibility with handwriting. Trialed multiple suggestions with focus on printing, use of weighted pen, and use of built up handle/pen grip. Pt demonstrating improved legibility with slightly weighted pen and when focusing on sizing and spacing.   Modifications: educated on modifications and adaptations to computer setup as well as wear of weighted gloves for improved motor control and to decrease impact of tremors on mouse use or when writing.  Pt with bradykinesia and micrographia but no tremors during activities at this time.    05/15/24 Large amplitude: engaged in finger flicks with full hand opening and thumb  opposition with finger extension. OT educating on completion daily as well as prior to [facility] tasks as needed for amplitude. Coordination:  OT providing demonstration and intermittent cues for amplitude during exercises.  Pt with decreased coordination and speed on L > R. Flipping Cards: Place deck of cards on the table. Flip cards over by opening your hand big to grasp and then turn your palm up big. Deal cards: Hold 1/2 or whole deck in your hand. Use thumb to push card off top of deck with one big push. Toss ball from one hand to the other: Toss big/high. Toss ball in the air and catch with the same hand: Toss big/high. Fasten nuts/bolts or put on bottle caps: Turn as much/as big as you can with each turn. Pick up coins and place in coin bank or container: Pick up with big, intentional movements. Do not drag coin to the edge. Pick up coins and stack one at a time: Pick up with big, intentional movements. Do not drag coin to the edge. (5-10 in a stack) Pick up coins and rotate 360*: pick up with big, intentional movements.   Pick up 5-10 coins one at a time and hold in palm. Then, move coins from palm to fingertips one at time and place in coin bank/container.   05/13/24 Large amplitude:  engaged in large amplitude forward reaching, reaching towards the floor, reaching overhead, and out to the side x10 each. OT providing demonstration, intermittent cues for amplitude and hand opening. Pt reports soreness in L shoulder afterwards.  OT educating in quality of movement and importance of large amplitude/opening as it pertains to dressing tasks and functional reach.  Transitioned to supine open book exercise with initial hand over hand to maintain positioning followed by mod verbal cues for positioning.  Shoulder ROM: engaged in vertical and diagonal reaching initially with dowel, progressing to use of red theraband for resistance and strengthening.  Pt completed in sitting progressing to standing for shoulder extension with band anchored above head.  Pt asking appropriate questions for technique throughout and demonstrating good understanding of each.  Provided with handout.   PATIENT EDUCATION: Education details: large amplitude and handwriting strategies Person educated: Patient Education method: Explanation, Verbal cues, and Handouts Education comprehension: verbalized understanding and needs further education  HOME EXERCISE PROGRAM: Access Code: [facility] URL: [email or web address] Date: 05/13/2024 Prepared by: [facility] - Outpatient  Rehab - [facility]  Exercises - Seated Finger Flicks with Elbow Extension  - 7 x weekly - 1 sets - 10 reps - Seated Reach Forward, Up, and To Sides  - 7 x weekly - 1 sets - 10 reps - Open Book Chest Stretch on Towel Roll  - 7 x weekly - 1 sets - 10 reps - Shoulder Single Arm PNF D2 Flexion with Resistance  - 2 x daily - 3 x weekly - 2 sets - 10 reps - Seated Shoulder Flexion with Self-Anchored Resistance  - 2 x daily - 3 x weekly - 2 sets - 10 reps - Single Arm Shoulder Extension with Anchored Resistance  - 2 x daily - 3 x weekly - 2 sets - 10 reps  GOALS: Goals reviewed with patient? Yes  SHORT TERM GOALS: Target date:  05/17/24  Pt will be Independent in PD specific HEP. Baseline: new PD diagnosis Goal status: in progress  2.  Pt will verbalize understanding of adapted strategies to maximize safety and Independence with ADLs/ IADLs . Baseline: difficulty with buttons, putting on pants, opening  caps and bottles, wiping down shower Goal status: in progress  3.  Pt will verbalize and demonstrate improved quality of handwriting by writing a short paragraph with 100% legibility and no significant decrease in letter size. Baseline: mild micrographia Goal status: in progress  4.  Pt will verbalize increased tolerance to typing and use of adaptive strategies PRN to increase success with typing longer passages. Baseline: hand fatigue after ~5 mins of typing Goal status: in progress   LONG TERM GOALS: Target date: 06/07/24  Pt will demonstrate improved fine motor coordination for ADLs as evidenced by decreasing 9 hole peg test score for BUE by 3 secs Baseline: Right: 29.25 sec; Left: 28.69 sec Goal status: in progress  2.  Pt will demonstrate improved UE functional use for ADLs as evidenced by increasing box/ blocks score by 5 blocks with RUE/LUE Baseline: R: 49 and L: 49 blocks Goal status: in progress  3.  Pt will demonstrate improved ease with fastening buttons as evidenced by decreasing 3 button/ unbutton time to 20 secs or less Baseline: 23.53 sec Goal status: in progress  4.  Pt will verbalize understanding of ways to prevent future PD related complications and PD community resources. Baseline: new PD diagnosis Goal status: in progress   ASSESSMENT:  CLINICAL IMPRESSION: Patient is a 73 y.o. male who was seen today for occupational therapy treatment for impairments in dexterity with handwriting/typing and ADLs due to PD.  Pt benefiting from education on quality of movement, especially with fine motor tasks, and carryover to functional tasks.  Pt pleasant and participatory throughout tasks. Pt  demonstrating improvements with legibility of handwriting when focusing on sizing and spacing and with both use of slightly weighted standard pen and moderately larger pen with grip.  Pt will continue to benefit from skilled occupational therapy services to address strength and coordination, balance, GM/FM control,introduction of compensatory strategies/AE prn, and implementation of an HEP to improve participation and safety during ADLs, IADLs, and quality of life.    PERFORMANCE DEFICITS: in functional skills including ADLs, IADLs, coordination, strength, Fine motor control, Gross motor control, body mechanics, endurance, decreased knowledge of precautions, and UE functional use and psychosocial skills including coping strategies and routines and behaviors.   IMPAIRMENTS: are limiting patient from ADLs, IADLs, and leisure.    PLAN:  OT FREQUENCY: 1-2x/week  OT DURATION: 6 weeks  PLANNED INTERVENTIONS: 97168 OT Re-evaluation, 97535 self care/ADL training, 02889 therapeutic exercise, 97530 therapeutic activity, 97112 neuromuscular re-education, 97750 Physical Performance Testing, functional mobility training, psychosocial skills training, energy conservation, coping strategies training, patient/family education, and DME and/or AE instructions  RECOMMENDED OTHER SERVICES: SLP eval  CONSULTED AND AGREED WITH PLAN OF CARE: Patient  PLAN FOR NEXT SESSION: Review large amplitude HEP and coordination HEP as needed, UB ROM to simulate wiping down shower/mirror, educate on community PD resources  Handwriting and typing   KAYLENE DOMINO, OTR/L 05/21/2024, 2:58 PM   [facility] [address], [address] [place], [place] [address] Phone # [phone] Fax # [phone]

## 2024-05-23 ENCOUNTER — Encounter: Payer: Self-pay | Admitting: Physical Therapy

## 2024-05-23 ENCOUNTER — Ambulatory Visit: Admitting: Physical Therapy

## 2024-05-23 ENCOUNTER — Ambulatory Visit: Admitting: Occupational Therapy

## 2024-05-23 DIAGNOSIS — R29818 Other symptoms and signs involving the nervous system: Secondary | ICD-10-CM

## 2024-05-23 DIAGNOSIS — R2689 Other abnormalities of gait and mobility: Secondary | ICD-10-CM

## 2024-05-23 DIAGNOSIS — R2681 Unsteadiness on feet: Secondary | ICD-10-CM

## 2024-05-23 DIAGNOSIS — R278 Other lack of coordination: Secondary | ICD-10-CM

## 2024-05-23 DIAGNOSIS — M6281 Muscle weakness (generalized): Secondary | ICD-10-CM

## 2024-05-23 DIAGNOSIS — R471 Dysarthria and anarthria: Secondary | ICD-10-CM | POA: Diagnosis not present

## 2024-05-23 NOTE — Therapy (Signed)
 OUTPATIENT OCCUPATIONAL THERAPY [facility]  Treatment Note  Patient Name: Peter Novak MRN: [id number] DOB:1950-10-19, 73 y.o., male Today's Date: 05/23/2024  PCP: Katrinka Garnette KIDD, MD REFERRING PROVIDER: Evonnie Asberry RAMAN, DO  END OF SESSION:  OT End of Session - 05/23/24 1153     Visit Number 5    Number of Visits 8    Date for Recertification  06/07/24    Authorization Type [employer]    Authorization Time Period auth submitted    OT Start Time 1149    OT Stop Time 1229    OT Time Calculation (min) 40 min              Past Medical History:  Diagnosis Date   ADD (attention deficit disorder with hyperactivity)    Allergy    seasonal   Arthritis    Cancer (HCC)    prostate cancer   Cataract    bilateral,removed   Complication of anesthesia    Constipation    GERD (gastroesophageal reflux disease)    GERD (gastroesophageal reflux disease)    Hearing loss    History of skin cancer    Melanoma X 2   Hyperlipidemia    Lyme disease 01/2021   Prostate cancer (HCC) 2016   Past Surgical History:  Procedure Laterality Date   CATARACT EXTRACTION, BILATERAL  March 2022 and April 2022   CHOLECYSTECTOMY  2005   COLONOSCOPY  2008   negative   ELBOW SURGERY Right 1994   ulnar transposition   EYE SURGERY  2021   cataract   HERNIA REPAIR  2005   INNER EAR SURGERY  2006   Incus transposition;oscillculoplasty; Dr Thaddeus GRIST EAR SURGERY  2011   Stirrup resection   LYMPHADENECTOMY Bilateral 11/04/2021   Procedure: LYMPHADENECTOMY, PELVIC;  Surgeon: Renda Glance, MD;  Location: [facility];  Service: Urology;  Laterality: Bilateral;   POLYPECTOMY     ROBOT ASSISTED LAPAROSCOPIC RADICAL PROSTATECTOMY N/A 11/04/2021   Procedure: XI ROBOTIC ASSISTED LAPAROSCOPIC RADICAL PROSTATECTOMY LEVEL 3;  Surgeon: Renda Glance, MD;  Location: [facility];  Service: Urology;  Laterality: N/A;   SHOULDER ARTHROSCOPY Left 2004   Dr Liam   TONSILLECTOMY AND ADENOIDECTOMY      VASECTOMY     Patient Active Problem List   Diagnosis Date Noted   Parkinson's disease without dyskinesia or fluctuating manifestations (HCC) 04/08/2024   Fatigue 01/16/2024   Excessive daytime sleepiness 01/16/2024   Genetic testing 09/21/2021   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   Bradycardia 11/05/2019   Recurrent episodes Dizziness/Ataxia 11/05/2019   White matter abnormality on MRI of brain 04/16/2019   Migraine with aura and with status migrainosus, not intractable 04/16/2019   Ocular migraine 04/16/2019   Migraine aura without headache (migraine equivalents) 04/16/2019   ADD (attention deficit disorder) 10/02/2014   Insomnia 10/02/2014   Erectile dysfunction 10/02/2014   Osteoarthritis 10/02/2014   Eustachian tube dysfunction 10/02/2014   GERD (gastroesophageal reflux disease) 02/12/2013   Prostate cancer (HCC) 02/12/2013   Benign neoplasm of adrenal gland 11/30/2009   Hyperglycemia 11/30/2009   History of melanoma 11/30/2009   HYPERLIPIDEMIA 07/10/2007    ONSET DATE: referral date 04/16/24  REFERRING DIAG: R68.89 (ICD-10-CM) - Difficulty writing  THERAPY DIAG:  Other lack of coordination  Other symptoms and signs involving the nervous system  Muscle weakness (generalized)  Rationale for Evaluation and Treatment: Rehabilitation  SUBJECTIVE:   SUBJECTIVE STATEMENT: Pt reports having low BP issues yesterday.    Pt accompanied by:  self  PERTINENT HISTORY: ADD, prostate CA, HLD, lyme disease   PRECAUTIONS: None  WEIGHT BEARING RESTRICTIONS: No  PAIN:  Are you having pain? No  FALLS: Has patient fallen in last 6 months? No  LIVING ENVIRONMENT: Lives with: lives with their spouse Lives in: House/apartment Stairs: 1 step to enter; 1 story home Has following equipment at home: Tour manager, and Grab bars  PLOF: Independent, Vocation/Vocational requirements: retired, and Leisure: reading, outdoors, walking a mile after supper  PATIENT GOALS: see  if I can get better/extend working time on [employer]  OBJECTIVE:  Note: Objective measures were completed at Evaluation unless otherwise noted.  HAND DOMINANCE: Right  ADLs: Overall ADLs: it takes longer Transfers/ambulation related to ADLs: no AD Eating: difficulty with opening small cap on square milk cartons Grooming: Mod I, increased time when shaving UB Dressing: buttons can be difficult LB Dressing: occasional difficulty getting pants Toileting: Mod I Bathing: Mod I Tub Shower transfers: Mod I Equipment: Walk in shower and built in bench in shower  IADLs: Light housekeeping: Mod I, still cutting grass Meal Prep: Mod I Community mobility: Mod I Medication management: Mod I Financial management: Mod I Handwriting: 100% legible, Mild micrographia, and 12.60 sec (whales live in a blue ocean.)  MOBILITY STATUS: Independent  POSTURE COMMENTS:  rounded shoulders, forward head, and weight shift left  ACTIVITY TOLERANCE: Activity tolerance: diminished  FUNCTIONAL OUTCOME MEASURES: Fastening/unfastening 3 buttons: 23.53 sec Physical performance test: PPT#2 (simulated eating) 11.56 & PPT#4 (donning/doffing jacket): TBD  COORDINATION: 9 Hole Peg test: Right: 29.25 sec; Left: 28.69 sec Box and Blocks:  Right 49 blocks, Left 49 blocks Alternating pronation/supination: slightly slowed B Alternating toe tap: hypokinesia L LE Finger to nose: WNL B  UE ROM:  WFL  UE MMT:   WFL Grip strength: Right: 50 and 54# and Left: 60 and 65#  SENSATION: Pt denies N/T in UEs/LEs  COGNITION: Overall cognitive status: Within functional limits for tasks assessed  OBSERVATIONS: Bradykinesia                                                                                                                    TREATMENT DATE:  05/23/24 Large amplitude hands: engaged in finger flicks with full hand opening and thumb opposition with finger extension. OT instructing in forearm supination/pronation  as well as wrist flexion/extension with large amplitude as well.  OT providing initial cue, pt then completing with good amplitude. Buttons: 23.75 sec and 18.91 sec with pt demonstrating increased attention to hand placement and purposeful movements during 2nd attempt. Coordination: completed bilaterally with focus on big, intentional movements.   Pick up coins and stack one at a time Pick up coins and rotate 360* Pick up 5-10 coins one at a time and hold in palm. Then, move coins from palm to fingertips one at time and place in coin bank/container. 9 hole peg test: R: 34.47 sec and L: 34.16 sec    05/21/24 Large amplitude: engaged in finger flicks with full hand opening  and thumb opposition with finger extension. OT providing initial cue, pt then completing with good amplitude. Handwriting: provided with handout of recommendations from [facility] to aid in legibility with handwriting. Trialed multiple suggestions with focus on printing, use of weighted pen, and use of built up handle/pen grip. Pt demonstrating improved legibility with slightly weighted pen and when focusing on sizing and spacing.   Modifications: educated on modifications and adaptations to computer setup as well as wear of weighted gloves for improved motor control and to decrease impact of tremors on mouse use or when writing.  Pt with bradykinesia and micrographia but no tremors during activities at this time.    05/15/24 Large amplitude: engaged in finger flicks with full hand opening and thumb opposition with finger extension. OT educating on completion daily as well as prior to [employer] tasks as needed for amplitude. Coordination:  OT providing demonstration and intermittent cues for amplitude during exercises.  Pt with decreased coordination and speed on L > R. Flipping Cards: Place deck of cards on the table. Flip cards over by opening your hand big to grasp and then turn your palm up big. Deal cards: Hold 1/2 or  whole deck in your hand. Use thumb to push card off top of deck with one big push. Toss ball from one hand to the other: Toss big/high. Toss ball in the air and catch with the same hand: Toss big/high. Fasten nuts/bolts or put on bottle caps: Turn as much/as big as you can with each turn. Pick up coins and place in coin bank or container: Pick up with big, intentional movements. Do not drag coin to the edge. Pick up coins and stack one at a time: Pick up with big, intentional movements. Do not drag coin to the edge. (5-10 in a stack) Pick up coins and rotate 360*: pick up with big, intentional movements.   Pick up 5-10 coins one at a time and hold in palm. Then, move coins from palm to fingertips one at time and place in coin bank/container.      PATIENT EDUCATION: Education details: large amplitude and intentional movements for fine and gross motor tasks Person educated: Patient Education method: Explanation, Verbal cues, and Handouts Education comprehension: verbalized understanding and needs further education  HOME EXERCISE PROGRAM: Access Code: [facility] URL: [email or web address] Date: 05/13/2024 Prepared by: [facility] - Outpatient  Rehab - [facility]  Exercises - Seated Finger Flicks with Elbow Extension  - 7 x weekly - 1 sets - 10 reps - Seated Reach Forward, Up, and To Sides  - 7 x weekly - 1 sets - 10 reps - Open Book Chest Stretch on Towel Roll  - 7 x weekly - 1 sets - 10 reps - Shoulder Single Arm PNF D2 Flexion with Resistance  - 2 x daily - 3 x weekly - 2 sets - 10 reps - Seated Shoulder Flexion with Self-Anchored Resistance  - 2 x daily - 3 x weekly - 2 sets - 10 reps - Single Arm Shoulder Extension with Anchored Resistance  - 2 x daily - 3 x weekly - 2 sets - 10 reps  GOALS: Goals reviewed with patient? Yes  SHORT TERM GOALS: Target date: 05/17/24  Pt will be Independent in PD specific HEP. Baseline: new PD diagnosis Goal status: in  progress  2.  Pt will verbalize understanding of adapted strategies to maximize safety and Independence with ADLs/ IADLs . Baseline: difficulty with buttons, putting on pants, opening caps and bottles,  wiping down shower Goal status: in progress  3.  Pt will verbalize and demonstrate improved quality of handwriting by writing a short paragraph with 100% legibility and no significant decrease in letter size. Baseline: mild micrographia Goal status: in progress  4.  Pt will verbalize increased tolerance to typing and use of adaptive strategies PRN to increase success with typing longer passages. Baseline: hand fatigue after ~5 mins of typing Goal status: in progress   LONG TERM GOALS: Target date: 06/07/24  Pt will demonstrate improved fine motor coordination for ADLs as evidenced by decreasing 9 hole peg test score for BUE by 3 secs Baseline: Right: 29.25 sec; Left: 28.69 sec 05/23/24: R: 34.47 sec and L: 34.16 sec Goal status: in progress  2.  Pt will demonstrate improved UE functional use for ADLs as evidenced by increasing box/ blocks score by 5 blocks with RUE/LUE Baseline: R: 49 and L: 49 blocks Goal status: in progress  3.  Pt will demonstrate improved ease with fastening buttons as evidenced by decreasing 3 button/ unbutton time to 20 secs or less Baseline: 23.53 sec Goal status: in progress  4.  Pt will verbalize understanding of ways to prevent future PD related complications and PD community resources. Baseline: new PD diagnosis Goal status: in progress   ASSESSMENT:  CLINICAL IMPRESSION: Patient is a 73 y.o. male who was seen today for occupational therapy treatment for impairments in dexterity with handwriting/typing and ADLs due to PD.  Pt benefiting from education on intentional, purposeful movements during fine and gross motor tasks. Pt pleasant and participatory throughout tasks. Pt demonstrating improvements with buttons when focusing on intentional movements,  however slowing on 9 hole peg test this session.  Pt will continue to benefit from skilled occupational therapy services to address strength and coordination, balance, GM/FM control,introduction of compensatory strategies/AE prn, and implementation of an HEP to improve participation and safety during ADLs, IADLs, and quality of life.    PERFORMANCE DEFICITS: in functional skills including ADLs, IADLs, coordination, strength, Fine motor control, Gross motor control, body mechanics, endurance, decreased knowledge of precautions, and UE functional use and psychosocial skills including coping strategies and routines and behaviors.   IMPAIRMENTS: are limiting patient from ADLs, IADLs, and leisure.    PLAN:  OT FREQUENCY: 1-2x/week  OT DURATION: 6 weeks  PLANNED INTERVENTIONS: 97168 OT Re-evaluation, 97535 self care/ADL training, 02889 therapeutic exercise, 97530 therapeutic activity, 97112 neuromuscular re-education, 97750 Physical Performance Testing, functional mobility training, psychosocial skills training, energy conservation, coping strategies training, patient/family education, and DME and/or AE instructions  RECOMMENDED OTHER SERVICES: SLP eval  CONSULTED AND AGREED WITH PLAN OF CARE: Patient  PLAN FOR NEXT SESSION: Review large amplitude HEP and coordination HEP as needed, UB ROM to simulate wiping down shower/mirror, educate on community PD resources  Handwriting and typing   KAYLENE DOMINO, OTR/L 05/23/2024, 12:45 PM   [facility] at [facility] [address], [address] [place], [place] [address] Phone # [phone] Fax # [phone]

## 2024-05-23 NOTE — Patient Instructions (Signed)
 Dr. Gutterman, [facility] [phone]

## 2024-05-23 NOTE — Therapy (Signed)
 OUTPATIENT PHYSICAL THERAPY NEURO TREATMENT NOTE   Patient Name: Peter Novak MRN: [id number] DOB:1951/01/20, 73 y.o., male Today's Date: 05/23/2024   PCP: Katrinka Garnette KIDD, MD  REFERRING PROVIDER: Evonnie Asberry RAMAN, DO      END OF SESSION:  PT End of Session - 05/23/24 1104     Visit Number 11    Number of Visits 21    Date for Recertification  06/28/24   per recert 05/21/2024   Authorization Type [employer]    Authorization Time Period approved 8 visits from 04/15/2024 - 05/27/2024; 5 more visits approved 05/15/2024-06/05/2024    Authorization - Visit Number 3    Authorization - Number of Visits 5    PT Start Time 1103    PT Stop Time 1144    PT Time Calculation (min) 41 min    Activity Tolerance Patient tolerated treatment well    Behavior During Therapy [facility] for tasks assessed/performed                   Past Medical History:  Diagnosis Date   ADD (attention deficit disorder with hyperactivity)    Allergy    seasonal   Arthritis    Cancer (HCC)    prostate cancer   Cataract    bilateral,removed   Complication of anesthesia    Constipation    GERD (gastroesophageal reflux disease)    GERD (gastroesophageal reflux disease)    Hearing loss    History of skin cancer    Melanoma X 2   Hyperlipidemia    Lyme disease 01/2021   Prostate cancer (HCC) 2016   Past Surgical History:  Procedure Laterality Date   CATARACT EXTRACTION, BILATERAL  March 2022 and April 2022   CHOLECYSTECTOMY  2005   COLONOSCOPY  2008   negative   ELBOW SURGERY Right 1994   ulnar transposition   EYE SURGERY  2021   cataract   HERNIA REPAIR  2005   INNER EAR SURGERY  2006   Incus transposition;oscillculoplasty; Dr Thaddeus GRIST EAR SURGERY  2011   Stirrup resection   LYMPHADENECTOMY Bilateral 11/04/2021   Procedure: LYMPHADENECTOMY, PELVIC;  Surgeon: Renda Glance, MD;  Location: [facility];  Service: Urology;  Laterality: Bilateral;   POLYPECTOMY     ROBOT ASSISTED  LAPAROSCOPIC RADICAL PROSTATECTOMY N/A 11/04/2021   Procedure: XI ROBOTIC ASSISTED LAPAROSCOPIC RADICAL PROSTATECTOMY LEVEL 3;  Surgeon: Renda Glance, MD;  Location: [facility];  Service: Urology;  Laterality: N/A;   SHOULDER ARTHROSCOPY Left 2004   Dr Liam   TONSILLECTOMY AND ADENOIDECTOMY     VASECTOMY     Patient Active Problem List   Diagnosis Date Noted   Parkinson's disease without dyskinesia or fluctuating manifestations (HCC) 04/08/2024   Fatigue 01/16/2024   Excessive daytime sleepiness 01/16/2024   Genetic testing 09/21/2021   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   Bradycardia 11/05/2019   Recurrent episodes Dizziness/Ataxia 11/05/2019   White matter abnormality on MRI of brain 04/16/2019   Migraine with aura and with status migrainosus, not intractable 04/16/2019   Ocular migraine 04/16/2019   Migraine aura without headache (migraine equivalents) 04/16/2019   ADD (attention deficit disorder) 10/02/2014   Insomnia 10/02/2014   Erectile dysfunction 10/02/2014   Osteoarthritis 10/02/2014   Eustachian tube dysfunction 10/02/2014   GERD (gastroesophageal reflux disease) 02/12/2013   Prostate cancer (HCC) 02/12/2013   Benign neoplasm of adrenal gland 11/30/2009   Hyperglycemia 11/30/2009   History of melanoma 11/30/2009   HYPERLIPIDEMIA 07/10/2007  ONSET DATE: April 2025   REFERRING DIAG: G20.A1 (ICD-10-CM) - Parkinson's disease without dyskinesia or fluctuating manifestations (HCC)  THERAPY DIAG:  Unsteadiness on feet  Other abnormalities of gait and mobility  Other symptoms and signs involving the nervous system  Rationale for Evaluation and Treatment: Rehabilitation  SUBJECTIVE:                                                                                                                                                                                             SUBJECTIVE STATEMENT: Felt pretty much normal last night.  Still having some readings of low  BP, but feeling pretty good today.  Pt accompanied by: self  PERTINENT HISTORY: ADD, prostate CA, HLD, lyme disease  PAIN:  Are you having pain? No  PRECAUTIONS: None  RED FLAGS: None   WEIGHT BEARING RESTRICTIONS: No  FALLS: Has patient fallen in last 6 months? No  LIVING ENVIRONMENT: Lives with: lives with their spouse Lives in: House/apartment Stairs: 1 step to enter; 1 story home Has following equipment at home: Single point cane, Tour manager, and Grab bars  PLOF: Independent, Vocation/Vocational requirements: retired, and Leisure: reading, outdoors, walking a mile after supper  PATIENT GOALS: I don't have any idea  OBJECTIVE:    TODAY'S TREATMENT: 05/23/2024 Activity Comments  Vitals:  117/71, HR 53 bpm sitting  Aerobic warm up with recumbent bike, x 7 minutes Level 2, BLEs only, self-selected 50-55 RPM, able to incr to 70 RPM  PWR! Moves FLOW x 5 reps Visual and verbal cues  Forward/back step and weightshift, 2 x 10 reps each leg Solid ground, on Airex Light UE support at treadmill rail  Four square step activity clockwise and counter clockwise Stepping over obstacles forward/back, then side            No c/o dizziness during activities in PT session    PATIENT EDUCATION: Education details: Answered pt's questions in regards to support/counseling in regards to new dx of Parkinson's disease-provided pt with name of Dr. G[facility]man [facility]) and also info about PD community group; discussed possibilities for PD community fitness-pt interested potentially in [employer], cycling classes, joining Hotel manager  Person educated: Patient Education method: Chief Technology Officer Education comprehension: verbalized understanding      HOME EXERCISE PROGRAM Access Code: [email or web address] URL: [email or web address] Date: 04/17/2024>04/24/2024  Prepared by: [facility] - Outpatient  Rehab - [facility]  Program Notes perform  balance exercises in a corner for safety ADDED PWR! Moves in standing 10-20 reps daily  Exercises - Single Leg Heel Raise with Unilateral Counter Support  - 1 x daily -  5 x weekly - 2 sets - 10 reps - Squat with Chair Touch  - 1 x daily - 5 x weekly - 2 sets - 10 reps - Side Stepping with Resistance at Ankles and Counter Support  - 1 x daily - 5 x weekly - 2 sets - 1 min hold - Romberg Stance with Eyes Closed  - 1 x daily - 5 x weekly - 2-3 sets - 30 sec hold - Romberg Stance with Head Rotation  - 1 x daily - 5 x weekly - 2-3 sets - 30 sec hold      Note: Objective measures were completed at Evaluation unless otherwise noted.  DIAGNOSTIC FINDINGS: 12/13/23 brain MRI: T2 FLAIR hyperintense signal changes within the cerebral white matter (moderate) and pons (mild), similar to the prior brain MRI of 05/10/2022. Findings are nonspecific and differential considerations include chronic small vessel ischemic disease, sequelae of a prior infectious/inflammatory process and sequelae of demyelinating disease, among others.   COGNITION: Overall cognitive status: Within functional limits for tasks assessed   SENSATION: Pt denies N/T in UEs/LEs  COORDINATION: Alternating pronation/supination: slightly slowed B Alternating toe tap: hypokinesia L LE Finger to nose: WNL B   MUSCLE TONE: rigidity in L LE, especially in ankle   POSTURE: rounded shoulders, forward head, and weight shift left  LOWER EXTREMITY ROM:     Active  Right Eval Left Eval  Hip flexion    Hip extension    Hip abduction    Hip adduction    Hip internal rotation    Hip external rotation    Knee flexion    Knee extension    Ankle dorsiflexion 10 3  Ankle plantarflexion    Ankle inversion    Ankle eversion     (Blank rows = not tested)  LOWER EXTREMITY MMT:    MMT (in sitting) Right Eval Left Eval  Hip flexion 4+ 4+  Hip extension    Hip abduction 3+ 3+  Hip adduction 4 4  Hip internal rotation     Hip external rotation    Knee flexion 4+ 4  Knee extension 4 4  Ankle dorsiflexion 5 5  Ankle plantarflexion 5 5  Ankle inversion    Ankle eversion    (Blank rows = not tested)  GAIT: Findings: Assistive device utilized:None, Level of assistance: Complete Independence, and Comments: reduced R arm swing  FUNCTIONAL TESTS:  5 times sit to stand: 18.42 sec without UEs 10 meter walk test: 9.21 sec  without AD  (3.56 ft/sec)                                                                                                                              TREATMENT DATE: 04/15/24     PATIENT EDUCATION: Education details: orthostatic hypotension -- monitoring, management and to get medical management Education method: Explanation, Demonstration, Tactile cues, Verbal cues, and Handouts Education comprehension: verbalized understanding  HOME EXERCISE PROGRAM: Not  yet initiated   GOALS: Goals reviewed with patient? Yes  SHORT TERM GOALS: Target date: 05/06/2024  Patient to be independent with initial HEP. Baseline: HEP initiated; reports consistent performance of HEP, 05/13/2024 Goal status: IN PROGRESS    LONG TERM GOALS: Target date: 05/27/2024>>UPDATED TARGET 06/28/2024  Patient to be independent with advanced HEP. Baseline: Needs to be updated, 05/21/24 Goal status: IN PROGRESS  Patient to verbalize understanding of local Parkinson's Disease community resources including community fitness post D/C.   Baseline: Not yet initiated  Goal status: IN PROGRESS  Patient to improve MiniBestTest score to atleast 17-21 to decrease risk of falls.  Baseline: 19 04/17/24 >24/28 05/21/2024 Goal status: MET 05/21/24  Patient to demonstrate 50% improvement in R UE swing during gait.  Baseline: decreased UE swing Goal status: IN PROGRESS  Patient to demonstrate 5xSTS test in <15 sec in order to decrease risk of falls.   Baseline: 18 sec>17.5 sec Goal status: IN PROGRESS9/23/2025  Patient to  report participation in weekly exercise class. Baseline: Not yet initiated  Goal status: IN PROGRESS  TUG/TUG Cognitive score to be < or equal to 10% difference for improved dual task with gait. Baseline: 9.4, 11.25 sec Goal status:  INITIAL  Push and release test backwards and lateral, to recover in 1-2 steps. Baseline:  3-4 steps Goal status:  INITIAL   ASSESSMENT:  CLINICAL IMPRESSION: Pt presents today with no new complaints, but does have general questions about potential counseling or small groups in regards to his recent PD diagnosis; provided information to patient for options.  Skilled PT session focused on large amplitude movement patterns with added cognitive component (PWR! FLOW) and with compliant surface/obstacles.  Overall, pt appears to have good foot clearance and step length; he does voice frustration about feeling less strong than he used to.  He is performing large amplitude movements well, even with added challenges. Pt will continue to benefit from skilled PT towards goals for improved functional mobility and decreased fall risk.  OBJECTIVE IMPAIRMENTS: Abnormal gait, decreased activity tolerance, decreased balance, decreased coordination, decreased endurance, decreased ROM, decreased strength, impaired tone, and postural dysfunction.   ACTIVITY LIMITATIONS: carrying, lifting, bending, sitting, standing, squatting, stairs, transfers, bed mobility, bathing, dressing, reach over head, and hygiene/grooming  PARTICIPATION LIMITATIONS: meal prep, cleaning, laundry, community activity, yard work, and church  PERSONAL FACTORS: Age, Past/current experiences, Time since onset of injury/illness/exacerbation, and 3+ comorbidities: ADD, prostate CA, HLD, lyme disease are also affecting patient's functional outcome.   REHAB POTENTIAL: Good  CLINICAL DECISION MAKING: Evolving/moderate complexity  EVALUATION COMPLEXITY: Moderate  PLAN:  PT FREQUENCY: 1-2x/week  PT DURATION:  6 weeks per recert 05/21/2024  PLANNED INTERVENTIONS: 97164- PT Re-evaluation, 97750- Physical Performance Testing, 97110-Therapeutic exercises, 97530- Therapeutic activity, 97112- Neuromuscular re-education, 97535- Self Care, 02859- Manual therapy, [phone]- Gait training, [phone]- Canalith repositioning, Patient/Family education, Balance training, Stair training, Taping, Vestibular training, Cryotherapy, and Moist heat  PLAN FOR NEXT SESSION:  Continue to incorporate large ampitude movement patterns (add to HEP-forward and back step and recover balance); considerations for community fitness plans.  Review PWR! Moves and progress-review and add FLOW to HEP; EC balance, compliant surface, narrow BOS, and balance with head turns    Referring diagnosis? G20.A1 (ICD-10-CM) - Parkinson's disease without dyskinesia or fluctuating manifestations (HCC) Treatment diagnosis? (if different than referring diagnosis) R26.81, M62.81, R29.818 What was this (referring dx) caused by? []  Surgery []  Fall [x]  Ongoing issue []  Arthritis []  Other: ____________  Laterality: []  Rt []  Lt [x]  Both  Check all possible CPT codes:  *CHOOSE 10 OR LESS*    See Planned Interventions listed in the Plan section of the Evaluation.     Greig Anon, PT 05/23/24 7:39 PM Phone: [phone] Fax: [phone]   [facility] at [facility] [address] [place], [address] [place], [place] [address] Phone # [phone] Fax # [phone]

## 2024-05-28 ENCOUNTER — Ambulatory Visit: Admitting: Physical Therapy

## 2024-05-31 ENCOUNTER — Ambulatory Visit: Admitting: Physical Therapy

## 2024-06-03 ENCOUNTER — Ambulatory Visit: Admitting: Occupational Therapy

## 2024-06-03 ENCOUNTER — Ambulatory Visit: Attending: Neurology | Admitting: Physical Therapy

## 2024-06-03 DIAGNOSIS — R29818 Other symptoms and signs involving the nervous system: Secondary | ICD-10-CM | POA: Insufficient documentation

## 2024-06-03 DIAGNOSIS — R2681 Unsteadiness on feet: Secondary | ICD-10-CM | POA: Insufficient documentation

## 2024-06-03 DIAGNOSIS — R471 Dysarthria and anarthria: Secondary | ICD-10-CM | POA: Diagnosis not present

## 2024-06-03 DIAGNOSIS — R278 Other lack of coordination: Secondary | ICD-10-CM

## 2024-06-03 DIAGNOSIS — M6281 Muscle weakness (generalized): Secondary | ICD-10-CM | POA: Insufficient documentation

## 2024-06-03 DIAGNOSIS — R29898 Other symptoms and signs involving the musculoskeletal system: Secondary | ICD-10-CM | POA: Diagnosis not present

## 2024-06-03 DIAGNOSIS — R41840 Attention and concentration deficit: Secondary | ICD-10-CM | POA: Diagnosis not present

## 2024-06-03 DIAGNOSIS — R2689 Other abnormalities of gait and mobility: Secondary | ICD-10-CM | POA: Diagnosis not present

## 2024-06-03 DIAGNOSIS — R490 Dysphonia: Secondary | ICD-10-CM | POA: Insufficient documentation

## 2024-06-03 NOTE — Therapy (Signed)
 OUTPATIENT OCCUPATIONAL THERAPY [facility]  Treatment Note  Patient Name: Peter Novak MRN: [id number] DOB:1951-01-07, 73 y.o., male Today's Date: 06/03/2024  PCP: Katrinka Garnette KIDD, MD REFERRING PROVIDER: Evonnie Asberry RAMAN, DO  END OF SESSION:  OT End of Session - 06/03/24 1020     Visit Number 6    Number of Visits 8    Date for Recertification  06/07/24    Authorization Type [employer]    Authorization Time Period 8 visits 04/25/2024 - 07/24/2024    Authorization - Visit Number 6    Authorization - Number of Visits 8    OT Start Time 1017    OT Stop Time 1100    OT Time Calculation (min) 43 min               Past Medical History:  Diagnosis Date   ADD (attention deficit disorder with hyperactivity)    Allergy    seasonal   Arthritis    Cancer (HCC)    prostate cancer   Cataract    bilateral,removed   Complication of anesthesia    Constipation    GERD (gastroesophageal reflux disease)    GERD (gastroesophageal reflux disease)    Hearing loss    History of skin cancer    Melanoma X 2   Hyperlipidemia    Lyme disease 01/2021   Prostate cancer (HCC) 2016   Past Surgical History:  Procedure Laterality Date   CATARACT EXTRACTION, BILATERAL  March 2022 and April 2022   CHOLECYSTECTOMY  2005   COLONOSCOPY  2008   negative   ELBOW SURGERY Right 1994   ulnar transposition   EYE SURGERY  2021   cataract   HERNIA REPAIR  2005   INNER EAR SURGERY  2006   Incus transposition;oscillculoplasty; Dr Thaddeus GRIST EAR SURGERY  2011   Stirrup resection   LYMPHADENECTOMY Bilateral 11/04/2021   Procedure: LYMPHADENECTOMY, PELVIC;  Surgeon: Renda Glance, MD;  Location: [facility];  Service: Urology;  Laterality: Bilateral;   POLYPECTOMY     ROBOT ASSISTED LAPAROSCOPIC RADICAL PROSTATECTOMY N/A 11/04/2021   Procedure: XI ROBOTIC ASSISTED LAPAROSCOPIC RADICAL PROSTATECTOMY LEVEL 3;  Surgeon: Renda Glance, MD;  Location: [facility];  Service: Urology;  Laterality:  N/A;   SHOULDER ARTHROSCOPY Left 2004   Dr Liam   TONSILLECTOMY AND ADENOIDECTOMY     VASECTOMY     Patient Active Problem List   Diagnosis Date Noted   Parkinson's disease without dyskinesia or fluctuating manifestations (HCC) 04/08/2024   Fatigue 01/16/2024   Excessive daytime sleepiness 01/16/2024   Genetic testing 09/21/2021   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   Bradycardia 11/05/2019   Recurrent episodes Dizziness/Ataxia 11/05/2019   White matter abnormality on MRI of brain 04/16/2019   Migraine with aura and with status migrainosus, not intractable 04/16/2019   Ocular migraine 04/16/2019   Migraine aura without headache (migraine equivalents) 04/16/2019   ADD (attention deficit disorder) 10/02/2014   Insomnia 10/02/2014   Erectile dysfunction 10/02/2014   Osteoarthritis 10/02/2014   Eustachian tube dysfunction 10/02/2014   GERD (gastroesophageal reflux disease) 02/12/2013   Prostate cancer (HCC) 02/12/2013   Benign neoplasm of adrenal gland 11/30/2009   Hyperglycemia 11/30/2009   History of melanoma 11/30/2009   HYPERLIPIDEMIA 07/10/2007    ONSET DATE: referral date 04/16/24  REFERRING DIAG: R68.89 (ICD-10-CM) - Difficulty writing  THERAPY DIAG:  Other symptoms and signs involving the nervous system  Unsteadiness on feet  Other lack of coordination  Muscle weakness (generalized)  Rationale for Evaluation and Treatment: Rehabilitation  SUBJECTIVE:   SUBJECTIVE STATEMENT: Pt reports that he had a good week at the beach, but is trying to get back into a routine now that he is home.  Pt accompanied by: self  PERTINENT HISTORY: ADD, prostate CA, HLD, lyme disease   PRECAUTIONS: None  WEIGHT BEARING RESTRICTIONS: No  PAIN:  Are you having pain? No  FALLS: Has patient fallen in last 6 months? No  LIVING ENVIRONMENT: Lives with: lives with their spouse Lives in: House/apartment Stairs: 1 step to enter; 1 story home Has following equipment at  home: Tour manager, and Grab bars  PLOF: Independent, Vocation/Vocational requirements: retired, and Leisure: reading, outdoors, walking a mile after supper  PATIENT GOALS: see if I can get better/extend working time on [employer]  OBJECTIVE:  Note: Objective measures were completed at Evaluation unless otherwise noted.  HAND DOMINANCE: Right  ADLs: Overall ADLs: it takes longer Transfers/ambulation related to ADLs: no AD Eating: difficulty with opening small cap on square milk cartons Grooming: Mod I, increased time when shaving UB Dressing: buttons can be difficult LB Dressing: occasional difficulty getting pants Toileting: Mod I Bathing: Mod I Tub Shower transfers: Mod I Equipment: Walk in shower and built in bench in shower  IADLs: Light housekeeping: Mod I, still cutting grass Meal Prep: Mod I Community mobility: Mod I Medication management: Mod I Financial management: Mod I Handwriting: 100% legible, Mild micrographia, and 12.60 sec (whales live in a blue ocean.)  MOBILITY STATUS: Independent  POSTURE COMMENTS:  rounded shoulders, forward head, and weight shift left  ACTIVITY TOLERANCE: Activity tolerance: diminished  FUNCTIONAL OUTCOME MEASURES: Fastening/unfastening 3 buttons: 23.53 sec Physical performance test: PPT#2 (simulated eating) 11.56 & PPT#4 (donning/doffing jacket): TBD  COORDINATION: 9 Hole Peg test: Right: 29.25 sec; Left: 28.69 sec Box and Blocks:  Right 49 blocks, Left 49 blocks Alternating pronation/supination: slightly slowed B Alternating toe tap: hypokinesia L LE Finger to nose: WNL B  UE ROM:  WFL  UE MMT:   WFL Grip strength: Right: 50 and 54# and Left: 60 and 65#  SENSATION: Pt denies N/T in UEs/LEs  COGNITION: Overall cognitive status: Within functional limits for tasks assessed  OBSERVATIONS: Bradykinesia                                                                                                                    TREATMENT  DATE:  06/03/24 Handwriting/typing: engaged in discussion about impact of arthritis and PD on quality of movement, especially with typing. Educating on setup of work space to ensure improved alignment and decreased pressure on joints. Pt reports that he feels stiff and has decreased control after ~10 mins of typing. Tendon gliding: OT educating on tendon gliding with hook fist, full fist, flat fist, and table top for motor control to aid in typing. Coordination: engaged in picking up coins one at a time, translating into palm of hand to hold 5-8 coins and then translating back to finger tips to place into  coin slot.  OT educating on carryover of intrinsic hand movements to quality of movement with typing, handwriting, and other fine motor tasks.   9 hole peg test: R: 29.31, 25.31 sec, 24.25 sec (average: 25.29 sec); L: 27.66, 33.0, 26.84 (average: 29.17 seconds) Self-care: educated on typical therapy program with PD with recommendation for screen after completion of OT appts.  OT encouraging pt to establish an exercise routine at home and to attend PD programming for carryover of exercises outside of therapy appts.  Pt still with multiple questions about his diagnosis as well as concerns with handwriting/typing.  Pt in agreement with plan to assess goals at next session with potential plan to take a 3-4 week hold on therapy to focus on establishing a home routine and then to return to assess if maintaining progress with fine motor tasks and quality of movement.   05/23/24 Large amplitude hands: engaged in finger flicks with full hand opening and thumb opposition with finger extension. OT instructing in forearm supination/pronation as well as wrist flexion/extension with large amplitude as well.  OT providing initial cue, pt then completing with good amplitude. Buttons: 23.75 sec and 18.91 sec with pt demonstrating increased attention to hand placement and purposeful movements during 2nd  attempt. Coordination: completed bilaterally with focus on big, intentional movements.   Pick up coins and stack one at a time Pick up coins and rotate 360* Pick up 5-10 coins one at a time and hold in palm. Then, move coins from palm to fingertips one at time and place in coin bank/container. 9 hole peg test: R: 34.47 sec and L: 34.16 sec    05/21/24 Large amplitude: engaged in finger flicks with full hand opening and thumb opposition with finger extension. OT providing initial cue, pt then completing with good amplitude. Handwriting: provided with handout of recommendations from [facility] to aid in legibility with handwriting. Trialed multiple suggestions with focus on printing, use of weighted pen, and use of built up handle/pen grip. Pt demonstrating improved legibility with slightly weighted pen and when focusing on sizing and spacing.   Modifications: educated on modifications and adaptations to computer setup as well as wear of weighted gloves for improved motor control and to decrease impact of tremors on mouse use or when writing.  Pt with bradykinesia and micrographia but no tremors during activities at this time.      PATIENT EDUCATION: Education details: large amplitude and intentional movements for fine and gross motor tasks Person educated: Patient Education method: Explanation, Verbal cues, and Handouts Education comprehension: verbalized understanding and needs further education  HOME EXERCISE PROGRAM: Access Code: [facility] URL: [email or web address] Date: 06/03/2024 Prepared by: [facility] - Outpatient  Rehab - [facility]  Exercises - Seated Finger Flicks with Elbow Extension  - 7 x weekly - 1 sets - 10 reps - Seated Reach Forward, Up, and To Sides  - 7 x weekly - 1 sets - 10 reps - Open Book Chest Stretch on Towel Roll  - 7 x weekly - 1 sets - 10 reps - Shoulder Single Arm PNF D2 Flexion with Resistance  - 2 x daily - 3 x weekly - 2  sets - 10 reps - Seated Shoulder Flexion with Self-Anchored Resistance  - 2 x daily - 3 x weekly - 2 sets - 10 reps - Single Arm Shoulder Extension with Anchored Resistance  - 2 x daily - 3 x weekly - 2 sets - 10 reps - Seated Digit Tendon Gliding  -  2 x daily - 10 reps  GOALS: Goals reviewed with patient? Yes  SHORT TERM GOALS: Target date: 05/17/24  Pt will be Independent in PD specific HEP. Baseline: new PD diagnosis Goal status: in progress  2.  Pt will verbalize understanding of adapted strategies to maximize safety and Independence with ADLs/ IADLs . Baseline: difficulty with buttons, putting on pants, opening caps and bottles, wiping down shower Goal status: in progress  3.  Pt will verbalize and demonstrate improved quality of handwriting by writing a short paragraph with 100% legibility and no significant decrease in letter size. Baseline: mild micrographia Goal status: in progress  4.  Pt will verbalize increased tolerance to typing and use of adaptive strategies PRN to increase success with typing longer passages. Baseline: hand fatigue after ~5 mins of typing Goal status: in progress   LONG TERM GOALS: Target date: 06/07/24  Pt will demonstrate improved fine motor coordination for ADLs as evidenced by decreasing 9 hole peg test score for BUE by 3 secs Baseline: Right: 29.25 sec; Left: 28.69 sec 05/23/24: R: 34.47 sec and L: 34.16 sec 06/03/24: R: 29.31, 25.31 sec, 24.25 sec (average: 25.29 sec);; L: 27.66, 33.0, 26.84 (average 29.17 sec) Goal status: MET on R 06/03/24; in progress with Left  2.  Pt will demonstrate improved UE functional use for ADLs as evidenced by increasing box/ blocks score by 5 blocks with RUE/LUE Baseline: R: 49 and L: 49 blocks Goal status: in progress  3.  Pt will demonstrate improved ease with fastening buttons as evidenced by decreasing 3 button/ unbutton time to 20 secs or less Baseline: 23.53 sec 05/24/24: 23.75 sec and 18.91 sec Goal  status: in progress  4.  Pt will verbalize understanding of ways to prevent future PD related complications and PD community resources. Baseline: new PD diagnosis Goal status: in progress   ASSESSMENT:  CLINICAL IMPRESSION: Patient is a 73 y.o. male who was seen today for occupational therapy treatment for impairments in dexterity with handwriting/typing and ADLs due to PD.  Pt benefiting from education on intentional, purposeful movements during fine and gross motor tasks. Pt pleasant and participatory throughout tasks. Pt pleased with conversation about quality of exercises for hands keeping both PD and arthritis considerations in mind.  Pt demonstrating improvements in coordination with pegs with repetition and purposeful movements.  OT encouraging pt to establish an exercise routine at home and to attend PD programming for carryover of exercises outside of therapy appts.  Pt still with multiple questions about his diagnosis as well as concerns with handwriting/typing.  Pt in agreement with plan to assess goals at next session with potential plan to take a 3-4 week hold on therapy (if measurements showing improvements from eval) to focus on establishing a home routine and then to return to assess if maintaining progress with fine motor tasks and quality of movement.  OT educating that if measurements remain stable after 3-4 week hold that therapy would recommend d/c at that time, continuing with HEP and outside PD programming, with return PD screen in 6-8 months.   PERFORMANCE DEFICITS: in functional skills including ADLs, IADLs, coordination, strength, Fine motor control, Gross motor control, body mechanics, endurance, decreased knowledge of precautions, and UE functional use and psychosocial skills including coping strategies and routines and behaviors.   IMPAIRMENTS: are limiting patient from ADLs, IADLs, and leisure.    PLAN:  OT FREQUENCY: 1-2x/week  OT DURATION: 6 weeks  PLANNED  INTERVENTIONS: 02831 OT Re-evaluation, 97535 self care/ADL training,  97110 therapeutic exercise, 97530 therapeutic activity, 97112 neuromuscular re-education, 97750 Physical Performance Testing, functional mobility training, psychosocial skills training, energy conservation, coping strategies training, patient/family education, and DME and/or AE instructions  RECOMMENDED OTHER SERVICES: SLP eval  CONSULTED AND AGREED WITH PLAN OF CARE: Patient  PLAN FOR NEXT SESSION: Review large amplitude HEP and coordination HEP, UB ROM to simulate wiping down shower/mirror, reiterate education on community PD resources  Handwriting and typing  Review goals and schedule additional appt in 3-4 weeks if improvements shown in all LTGs (will need to cancel visits except for one late Oct- early Nov).  If pt with major concerns or no progress will need to re-cert and continue with appts    KAYLENE DOMINO, OTR/L 06/03/2024, 12:13 PM   [facility] at [facility] [address] [place], [address] [place], [place] [address] Phone # [phone] Fax # [phone]

## 2024-06-03 NOTE — Therapy (Signed)
 OUTPATIENT PHYSICAL THERAPY NEURO TREATMENT NOTE   Patient Name: Peter Novak MRN: [id number] DOB:Jan 15, 1951, 73 y.o., male Today's Date: 06/03/2024   PCP: Katrinka Garnette KIDD, MD  REFERRING PROVIDER: Evonnie Asberry RAMAN, DO      END OF SESSION:  PT End of Session - 06/03/24 0933     Visit Number 12    Number of Visits 21    Date for Recertification  06/28/24   per recert 05/21/2024   Authorization Type [employer]    Authorization Time Period approved 8 visits from 04/15/2024 - 05/27/2024; 5 more visits approved 05/15/2024-06/05/2024    Authorization - Visit Number 4    Authorization - Number of Visits 5    PT Start Time 0934    PT Stop Time 1016    PT Time Calculation (min) 42 min    Activity Tolerance Patient tolerated treatment well    Behavior During Therapy [facility] for tasks assessed/performed                    Past Medical History:  Diagnosis Date   ADD (attention deficit disorder with hyperactivity)    Allergy    seasonal   Arthritis    Cancer (HCC)    prostate cancer   Cataract    bilateral,removed   Complication of anesthesia    Constipation    GERD (gastroesophageal reflux disease)    GERD (gastroesophageal reflux disease)    Hearing loss    History of skin cancer    Melanoma X 2   Hyperlipidemia    Lyme disease 01/2021   Prostate cancer (HCC) 2016   Past Surgical History:  Procedure Laterality Date   CATARACT EXTRACTION, BILATERAL  March 2022 and April 2022   CHOLECYSTECTOMY  2005   COLONOSCOPY  2008   negative   ELBOW SURGERY Right 1994   ulnar transposition   EYE SURGERY  2021   cataract   HERNIA REPAIR  2005   INNER EAR SURGERY  2006   Incus transposition;oscillculoplasty; Dr Thaddeus GRIST EAR SURGERY  2011   Stirrup resection   LYMPHADENECTOMY Bilateral 11/04/2021   Procedure: LYMPHADENECTOMY, PELVIC;  Surgeon: Renda Glance, MD;  Location: [facility];  Service: Urology;  Laterality: Bilateral;   POLYPECTOMY     ROBOT  ASSISTED LAPAROSCOPIC RADICAL PROSTATECTOMY N/A 11/04/2021   Procedure: XI ROBOTIC ASSISTED LAPAROSCOPIC RADICAL PROSTATECTOMY LEVEL 3;  Surgeon: Renda Glance, MD;  Location: [facility];  Service: Urology;  Laterality: N/A;   SHOULDER ARTHROSCOPY Left 2004   Dr Liam   TONSILLECTOMY AND ADENOIDECTOMY     VASECTOMY     Patient Active Problem List   Diagnosis Date Noted   Parkinson's disease without dyskinesia or fluctuating manifestations (HCC) 04/08/2024   Fatigue 01/16/2024   Excessive daytime sleepiness 01/16/2024   Genetic testing 09/21/2021   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   Bradycardia 11/05/2019   Recurrent episodes Dizziness/Ataxia 11/05/2019   White matter abnormality on MRI of brain 04/16/2019   Migraine with aura and with status migrainosus, not intractable 04/16/2019   Ocular migraine 04/16/2019   Migraine aura without headache (migraine equivalents) 04/16/2019   ADD (attention deficit disorder) 10/02/2014   Insomnia 10/02/2014   Erectile dysfunction 10/02/2014   Osteoarthritis 10/02/2014   Eustachian tube dysfunction 10/02/2014   GERD (gastroesophageal reflux disease) 02/12/2013   Prostate cancer (HCC) 02/12/2013   Benign neoplasm of adrenal gland 11/30/2009   Hyperglycemia 11/30/2009   History of melanoma 11/30/2009   HYPERLIPIDEMIA 07/10/2007  ONSET DATE: April 2025   REFERRING DIAG: G20.A1 (ICD-10-CM) - Parkinson's disease without dyskinesia or fluctuating manifestations (HCC)  THERAPY DIAG:  Other symptoms and signs involving the nervous system  Unsteadiness on feet  Other abnormalities of gait and mobility  Rationale for Evaluation and Treatment: Rehabilitation  SUBJECTIVE:                                                                                                                                                                                             SUBJECTIVE STATEMENT: Did some walking on the beach, several miles per day; did  fine on the beach.  Going to join a gym-[employer] probably  Pt accompanied by: self  PERTINENT HISTORY: ADD, prostate CA, HLD, lyme disease  PAIN:  Are you having pain? No  PRECAUTIONS: None  RED FLAGS: None   WEIGHT BEARING RESTRICTIONS: No  FALLS: Has patient fallen in last 6 months? No  LIVING ENVIRONMENT: Lives with: lives with their spouse Lives in: House/apartment Stairs: 1 step to enter; 1 story home Has following equipment at home: Single point cane, Tour manager, and Grab bars  PLOF: Independent, Vocation/Vocational requirements: retired, and Leisure: reading, outdoors, walking a mile after supper  PATIENT GOALS: I don't have any idea  OBJECTIVE:    TODAY'S TREATMENT: 06/03/2024 Activity Comments  Sit to stand  Solid surface, EO/EC x 10 each On Airex surface x 10   Resisted gait-forward Back Side R Side L 20# at weight rack, 5 reps Min guard and cues for posture, eccentric control  Resisted sidestepping along counter 3 reps 5#  Forward/back step and weightshift, 10 reps each side 5#  Monster walk forward, 5 reps 5#, visual targets to help with sequence      Access Code: [email or web address] URL: [email or web address] Date: 06/03/2024 Prepared by: [facility]patient  Rehab - [facility]  Program Notes perform balance exercises in a corner for safety ADDED PWR! Moves in standing 10-20 reps daily  Exercises - Single Leg Heel Raise with Unilateral Counter Support  - 1 x daily - 5 x weekly - 2 sets - 10 reps - Squat with Chair Touch  - 1 x daily - 5 x weekly - 2 sets - 10 reps - Side Stepping with Resistance at Ankles and Counter Support  - 1 x daily - 5 x weekly - 2 sets - 1 min hold - Romberg Stance with Eyes Closed  - 1 x daily - 5 x weekly - 2-3 sets - 30 sec hold - Romberg Stance with Head Rotation  - 1 x daily - 5 x weekly - 2-3 sets -  30 sec hold - Step forward/Backward-Balance Reaction  - 1 x daily - 5 x weekly - 2 sets - 10  reps      PATIENT EDUCATION: Education details: Updates to HEP-see above Person educated: Patient Education method: Explanation and Handouts Education comprehension: verbalized understanding         Note: Objective measures were completed at Evaluation unless otherwise noted.  DIAGNOSTIC FINDINGS: 12/13/23 brain MRI: T2 FLAIR hyperintense signal changes within the cerebral white matter (moderate) and pons (mild), similar to the prior brain MRI of 05/10/2022. Findings are nonspecific and differential considerations include chronic small vessel ischemic disease, sequelae of a prior infectious/inflammatory process and sequelae of demyelinating disease, among others.   COGNITION: Overall cognitive status: Within functional limits for tasks assessed   SENSATION: Pt denies N/T in UEs/LEs  COORDINATION: Alternating pronation/supination: slightly slowed B Alternating toe tap: hypokinesia L LE Finger to nose: WNL B   MUSCLE TONE: rigidity in L LE, especially in ankle   POSTURE: rounded shoulders, forward head, and weight shift left  LOWER EXTREMITY ROM:     Active  Right Eval Left Eval  Hip flexion    Hip extension    Hip abduction    Hip adduction    Hip internal rotation    Hip external rotation    Knee flexion    Knee extension    Ankle dorsiflexion 10 3  Ankle plantarflexion    Ankle inversion    Ankle eversion     (Blank rows = not tested)  LOWER EXTREMITY MMT:    MMT (in sitting) Right Eval Left Eval  Hip flexion 4+ 4+  Hip extension    Hip abduction 3+ 3+  Hip adduction 4 4  Hip internal rotation    Hip external rotation    Knee flexion 4+ 4  Knee extension 4 4  Ankle dorsiflexion 5 5  Ankle plantarflexion 5 5  Ankle inversion    Ankle eversion    (Blank rows = not tested)  GAIT: Findings: Assistive device utilized:None, Level of assistance: Complete Independence, and Comments: reduced R arm swing  FUNCTIONAL TESTS:  5 times sit to  stand: 18.42 sec without UEs 10 meter walk test: 9.21 sec  without AD  (3.56 ft/sec)                                                                                                                              TREATMENT DATE: 04/15/24     PATIENT EDUCATION: Education details: orthostatic hypotension -- monitoring, management and to get medical management Education method: Explanation, Demonstration, Tactile cues, Verbal cues, and Handouts Education comprehension: verbalized understanding  HOME EXERCISE PROGRAM: Not yet initiated   GOALS: Goals reviewed with patient? Yes  SHORT TERM GOALS: Target date: 05/06/2024  Patient to be independent with initial HEP. Baseline: HEP initiated; reports consistent performance of HEP, 05/13/2024 Goal status: MET    LONG TERM GOALS: Target date: 05/27/2024>>UPDATED TARGET 06/28/2024  Patient to be independent with advanced HEP. Baseline: Needs to be updated, 05/21/24 Goal status: IN PROGRESS  Patient to verbalize understanding of local Parkinson's Disease community resources including community fitness post D/C.   Baseline: Not yet initiated  Goal status: IN PROGRESS  Patient to improve MiniBestTest score to atleast 17-21 to decrease risk of falls.  Baseline: 19 04/17/24 >24/28 05/21/2024 Goal status: MET 05/21/24  Patient to demonstrate 50% improvement in R UE swing during gait.  Baseline: decreased UE swing Goal status: IN PROGRESS  Patient to demonstrate 5xSTS test in <15 sec in order to decrease risk of falls.   Baseline: 18 sec>17.5 sec Goal status: IN PROGRESS9/23/2025  Patient to report participation in weekly exercise class. Baseline: Not yet initiated  Goal status: IN PROGRESS  TUG/TUG Cognitive score to be < or equal to 10% difference for improved dual task with gait. Baseline: 9.4, 11.25 sec Goal status:  INITIAL  Push and release test backwards and lateral, to recover in 1-2 steps. Baseline:  3-4 steps Goal status:   INITIAL   ASSESSMENT:  CLINICAL IMPRESSION: Pt presents today after having been on vacation to beach, where he reports no issues with balance. Skilled PT session focused on sit to stand exercises, resisted gait and resisted stepping exercises for dynamic balance.  He has some initial difficulty with resisted gait and with monster walk activities, but improves with repetition.  Updated HEP to reflect forward/back stepping strategy added to HEP.  Pt will continue to benefit from skilled PT towards goals for improved functional mobility and decreased fall risk.  OBJECTIVE IMPAIRMENTS: Abnormal gait, decreased activity tolerance, decreased balance, decreased coordination, decreased endurance, decreased ROM, decreased strength, impaired tone, and postural dysfunction.   ACTIVITY LIMITATIONS: carrying, lifting, bending, sitting, standing, squatting, stairs, transfers, bed mobility, bathing, dressing, reach over head, and hygiene/grooming  PARTICIPATION LIMITATIONS: meal prep, cleaning, laundry, community activity, yard work, and church  PERSONAL FACTORS: Age, Past/current experiences, Time since onset of injury/illness/exacerbation, and 3+ comorbidities: ADD, prostate CA, HLD, lyme disease are also affecting patient's functional outcome.   REHAB POTENTIAL: Good  CLINICAL DECISION MAKING: Evolving/moderate complexity  EVALUATION COMPLEXITY: Moderate  PLAN:  PT FREQUENCY: 1-2x/week  PT DURATION: 6 weeks per recert 05/21/2024  PLANNED INTERVENTIONS: 97164- PT Re-evaluation, 97750- Physical Performance Testing, 97110-Therapeutic exercises, 97530- Therapeutic activity, 97112- Neuromuscular re-education, 97535- Self Care, [id number]- Manual therapy, [id number]- Gait training, [phone]- Canalith repositioning, Patient/Family education, Balance training, Stair training, Taping, Vestibular training, Cryotherapy, and Moist heat  PLAN FOR NEXT SESSION:  *Will need to submit for [facility] more visits after next session*.   Review HEP addition and continue resisted gait.  Provide info for community fitness plans (*He is interested in [employer] PD class; cycling, [employer] and may be good candidate for [employer]! Moves-will need info).  Review PWR! Moves as needed and progress-review and add FLOW to HEP; EC balance, compliant surface, narrow BOS, and balance with head turns    Referring diagnosis? G20.A1 (ICD-10-CM) - Parkinson's disease without dyskinesia or fluctuating manifestations (HCC) Treatment diagnosis? (if different than referring diagnosis) R26.81, M62.81, R29.818 What was this (referring dx) caused by? []  Surgery []  Fall [x]  Ongoing issue []  Arthritis []  Other: ____________  Laterality: []  Rt []  Lt [x]  Both  Check all possible CPT codes:  *CHOOSE 10 OR LESS*    See Planned Interventions listed in the Plan section of the Evaluation.     Greig Anon, PT 06/03/24 10:24 AM Phone: [phone] Fax: [phone]  [facility] at [facility] [address] [place], [address] [place], [place] [address] Phone # [phone] Fax # [phone]

## 2024-06-04 NOTE — Therapy (Signed)
 OUTPATIENT PHYSICAL THERAPY NEURO TREATMENT NOTE   Patient Name: Peter Novak MRN: [id number] DOB:1951-04-15, 73 y.o., male Today's Date: 06/05/2024   PCP: Katrinka Garnette KIDD, MD  REFERRING PROVIDER: Evonnie Asberry RAMAN, DO      END OF SESSION:  PT End of Session - 06/05/24 1017     Visit Number 13    Number of Visits 21    Date for Recertification  06/28/24   per recert 05/21/2024   Authorization Type [employer]    Authorization Time Period approved 8 visits from 04/15/2024 - 05/27/2024; 5 more visits approved 05/15/2024-06/05/2024   auth submitted   Authorization - Visit Number 5    Authorization - Number of Visits 5    PT Start Time 0933    PT Stop Time 1015    PT Time Calculation (min) 42 min    Equipment Utilized During Treatment Gait belt    Activity Tolerance Patient tolerated treatment well    Behavior During Therapy WFL for tasks assessed/performed                     Past Medical History:  Diagnosis Date   ADD (attention deficit disorder with hyperactivity)    Allergy    seasonal   Arthritis    Cancer (HCC)    prostate cancer   Cataract    bilateral,removed   Complication of anesthesia    Constipation    GERD (gastroesophageal reflux disease)    GERD (gastroesophageal reflux disease)    Hearing loss    History of skin cancer    Melanoma X 2   Hyperlipidemia    Lyme disease 01/2021   Prostate cancer (HCC) 2016   Past Surgical History:  Procedure Laterality Date   CATARACT EXTRACTION, BILATERAL  March 2022 and April 2022   CHOLECYSTECTOMY  2005   COLONOSCOPY  2008   negative   ELBOW SURGERY Right 1994   ulnar transposition   EYE SURGERY  2021   cataract   HERNIA REPAIR  2005   INNER EAR SURGERY  2006   Incus transposition;oscillculoplasty; Dr Thaddeus GRIST EAR SURGERY  2011   Stirrup resection   LYMPHADENECTOMY Bilateral 11/04/2021   Procedure: LYMPHADENECTOMY, PELVIC;  Surgeon: Renda Glance, MD;  Location: [facility];   Service: Urology;  Laterality: Bilateral;   POLYPECTOMY     ROBOT ASSISTED LAPAROSCOPIC RADICAL PROSTATECTOMY N/A 11/04/2021   Procedure: XI ROBOTIC ASSISTED LAPAROSCOPIC RADICAL PROSTATECTOMY LEVEL 3;  Surgeon: Renda Glance, MD;  Location: [facility];  Service: Urology;  Laterality: N/A;   SHOULDER ARTHROSCOPY Left 2004   Dr Liam   TONSILLECTOMY AND ADENOIDECTOMY     VASECTOMY     Patient Active Problem List   Diagnosis Date Noted   Parkinson's disease without dyskinesia or fluctuating manifestations (HCC) 04/08/2024   Fatigue 01/16/2024   Excessive daytime sleepiness 01/16/2024   Genetic testing 09/21/2021   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   Bradycardia 11/05/2019   Recurrent episodes Dizziness/Ataxia 11/05/2019   White matter abnormality on MRI of brain 04/16/2019   Migraine with aura and with status migrainosus, not intractable 04/16/2019   Ocular migraine 04/16/2019   Migraine aura without headache (migraine equivalents) 04/16/2019   ADD (attention deficit disorder) 10/02/2014   Insomnia 10/02/2014   Erectile dysfunction 10/02/2014   Osteoarthritis 10/02/2014   Eustachian tube dysfunction 10/02/2014   GERD (gastroesophageal reflux disease) 02/12/2013   Prostate cancer (HCC) 02/12/2013   Benign neoplasm of adrenal gland 11/30/2009  Hyperglycemia 11/30/2009   History of melanoma 11/30/2009   HYPERLIPIDEMIA 07/10/2007    ONSET DATE: April 2025   REFERRING DIAG: G20.A1 (ICD-10-CM) - Parkinson's disease without dyskinesia or fluctuating manifestations (HCC)  THERAPY DIAG:  Unsteadiness on feet  Muscle weakness (generalized)  Other abnormalities of gait and mobility  Rationale for Evaluation and Treatment: Rehabilitation  SUBJECTIVE:                                                                                                                                                                                             SUBJECTIVE STATEMENT: Reports that this  past week he has not had any trouble with his BP. However, my voice is screwy.   Pt accompanied by: self  PERTINENT HISTORY: ADD, prostate CA, HLD, lyme disease  PAIN:  Are you having pain? No  PRECAUTIONS: None  RED FLAGS: None   WEIGHT BEARING RESTRICTIONS: No  FALLS: Has patient fallen in last 6 months? No  LIVING ENVIRONMENT: Lives with: lives with their spouse Lives in: House/apartment Stairs: 1 step to enter; 1 story home Has following equipment at home: Single point cane, Tour manager, and Grab bars  PLOF: Independent, Vocation/Vocational requirements: retired, and Leisure: reading, outdoors, walking a mile after supper  PATIENT GOALS: I don't have any idea  OBJECTIVE:    TODAY'S TREATMENT: 06/05/24 Activity Comments     push and release backwards and lateral 2 steps required in each direction   TUG/TUG manual/TUG cog 10.13 sec 10.86 sec 12.58 sec (24% increase)  5xSTS 15.37 sec without UEs  Gait training x162ft 50% improvement in R arm swing  Standing PWR moves FLOW: Up Rock Twist Step  Started with PREPARE, then ACTIVATE. Cueing to reset at midline each time      PATIENT EDUCATION: Education details: discussed pt's medication schedule and possibility of off times; edu on progress towards goals and remaining impairments, handout and edu on PWR moves class, encouraged joining gym over the weekend in preparation for next PT session where he will start on gym HEP Person educated: Patient Education method: Explanation, Demonstration, Tactile cues, Verbal cues, and Handouts Education comprehension: verbalized understanding and returned demonstration    Access Code: [email or web address] URL: [email or web address] Date: 06/03/2024 Prepared by: [facility] - [facility]  Program Notes perform balance exercises in a corner for safety ADDED PWR! Moves in standing 10-20 reps daily  Exercises - Single Leg Heel Raise with  Unilateral Counter Support  - 1 x daily - 5 x weekly - 2 sets - 10 reps - Squat with Chair Touch  - 1 x daily -  5 x weekly - 2 sets - 10 reps - Side Stepping with Resistance at Ankles and Counter Support  - 1 x daily - 5 x weekly - 2 sets - 1 min hold - Romberg Stance with Eyes Closed  - 1 x daily - 5 x weekly - 2-3 sets - 30 sec hold - Romberg Stance with Head Rotation  - 1 x daily - 5 x weekly - 2-3 sets - 30 sec hold - Step forward/Backward-Balance Reaction  - 1 x daily - 5 x weekly - 2 sets - 10 reps      Note: Objective measures were completed at Evaluation unless otherwise noted.  DIAGNOSTIC FINDINGS: 12/13/23 brain MRI: T2 FLAIR hyperintense signal changes within the cerebral white matter (moderate) and pons (mild), similar to the prior brain MRI of 05/10/2022. Findings are nonspecific and differential considerations include chronic small vessel ischemic disease, sequelae of a prior infectious/inflammatory process and sequelae of demyelinating disease, among others.   COGNITION: Overall cognitive status: Within functional limits for tasks assessed   SENSATION: Pt denies N/T in UEs/LEs  COORDINATION: Alternating pronation/supination: slightly slowed B Alternating toe tap: hypokinesia L LE Finger to nose: WNL B   MUSCLE TONE: rigidity in L LE, especially in ankle   POSTURE: rounded shoulders, forward head, and weight shift left  LOWER EXTREMITY ROM:     Active  Right Eval Left Eval  Hip flexion    Hip extension    Hip abduction    Hip adduction    Hip internal rotation    Hip external rotation    Knee flexion    Knee extension    Ankle dorsiflexion 10 3  Ankle plantarflexion    Ankle inversion    Ankle eversion     (Blank rows = not tested)  LOWER EXTREMITY MMT:    MMT (in sitting) Right Eval Left Eval  Hip flexion 4+ 4+  Hip extension    Hip abduction 3+ 3+  Hip adduction 4 4  Hip internal rotation    Hip external rotation    Knee flexion 4+ 4   Knee extension 4 4  Ankle dorsiflexion 5 5  Ankle plantarflexion 5 5  Ankle inversion    Ankle eversion    (Blank rows = not tested)  GAIT: Findings: Assistive device utilized:None, Level of assistance: Complete Independence, and Comments: reduced R arm swing  FUNCTIONAL TESTS:  5 times sit to stand: 18.42 sec without UEs 10 meter walk test: 9.21 sec  without AD  (3.56 ft/sec)                                                                                                                              TREATMENT DATE: 04/15/24     PATIENT EDUCATION: Education details: orthostatic hypotension -- monitoring, management and to get medical management Education method: Explanation, Demonstration, Tactile cues, Verbal cues, and Handouts Education comprehension: verbalized understanding  HOME EXERCISE PROGRAM: Not yet  initiated   GOALS: Goals reviewed with patient? Yes  SHORT TERM GOALS: Target date: 05/06/2024  Patient to be independent with initial HEP. Baseline: HEP initiated; reports consistent performance of HEP, 05/13/2024 Goal status: MET    LONG TERM GOALS: Target date: 05/27/2024>>UPDATED TARGET 06/28/2024  Patient to be independent with advanced HEP. Baseline: Needs to be updated, 05/21/24 Goal status: IN PROGRESS  Patient to verbalize understanding of local Parkinson's Disease community resources including community fitness post D/C.   Baseline: pt received and reported understanding  Goal status: MET   Patient to improve MiniBestTest score to atleast 17-21 to decrease risk of falls.  Baseline: 19 04/17/24 >24/28 05/21/2024 Goal status: MET 05/21/24  Patient to demonstrate 50% improvement in R UE swing during gait.  Baseline: decreased UE swing; much improved 06/05/24 Goal status: MET 06/05/24  Patient to demonstrate 5xSTS test in <15 sec in order to decrease risk of falls.   Baseline: 18 sec>17.5 sec; 15.37 sec without UEs 06/05/24 Goal status: IN PROGRESS  06/05/24  Patient to report participation in weekly exercise class. Baseline: Not yet initiated; looking into [facility] gym 06/05/24 Goal status: IN PROGRESS 06/05/24  TUG/TUG Cognitive score to be < or equal to 10% difference for improved dual task with gait. Baseline: 9.4, 11.25 sec; 24% increase 06/05/24  Goal status:  IN PROGRESS 06/05/24   Push and release test backwards and lateral, to recover in 1-2 steps. Baseline:  3-4 steps; 2 steps required in each direction 06/05/24 Goal status:  MET 06/05/24   ASSESSMENT:  CLINICAL IMPRESSION: Patient arrived to session without new complaints. Patient is progressing towards meeting 5xSTS goal as this was quicker today. Dual tasking as evidenced by TUG/TUG cognitive is still limited. Improved ability to recover reactive balance today and this goal has been met. Discussed patient's interest in gym membership and PWR moves class. Plan for development of strength-focused gym HEP next session. Patient is progressing well towards goals and would benefit from additional skilled PT services.  OBJECTIVE IMPAIRMENTS: Abnormal gait, decreased activity tolerance, decreased balance, decreased coordination, decreased endurance, decreased ROM, decreased strength, impaired tone, and postural dysfunction.   ACTIVITY LIMITATIONS: carrying, lifting, bending, sitting, standing, squatting, stairs, transfers, bed mobility, bathing, dressing, reach over head, and hygiene/grooming  PARTICIPATION LIMITATIONS: meal prep, cleaning, laundry, community activity, yard work, and church  PERSONAL FACTORS: Age, Past/current experiences, Time since onset of injury/illness/exacerbation, and 3+ comorbidities: ADD, prostate CA, HLD, lyme disease are also affecting patient's functional outcome.   REHAB POTENTIAL: Good  CLINICAL DECISION MAKING: Evolving/moderate complexity  EVALUATION COMPLEXITY: Moderate  PLAN:  PT FREQUENCY: 1-2x/week  PT DURATION: 6 weeks per recert  05/21/2024  PLANNED INTERVENTIONS: 97164- PT Re-evaluation, 97750- Physical Performance Testing, 97110-Therapeutic exercises, 97530- Therapeutic activity, 97112- Neuromuscular re-education, 97535- Self Care, [id number]- Manual therapy, [phone]- Gait training, [phone]- Canalith repositioning, Patient/Family education, Balance training, Stair training, Taping, Vestibular training, Cryotherapy, and Moist heat  PLAN FOR NEXT SESSION: start patient on strength training gym HEP to initiate at [facility]. He plans to join over the weekend.  Review HEP addition and continue resisted gait. Review PWR! Moves as needed and progress-review and add FLOW to HEP; EC balance, compliant surface, narrow BOS, and balance with head turns   Referring diagnosis? G20.A1 (ICD-10-CM) - Parkinson's disease without dyskinesia or fluctuating manifestations (HCC) Treatment diagnosis? (if different than referring diagnosis)  Unsteadiness on feet  Muscle weakness (generalized)  Other abnormalities of gait and mobility What was this (referring dx) caused by? []  Surgery []  Fall []   Ongoing issue []  Arthritis [x]  Other: _insidious___________  Laterality: []  Rt []  Lt [x]  Both  Check all possible CPT codes:  *CHOOSE 10 OR LESS*    See Planned Interventions listed in the Plan section of the Evaluation. ,a   Louana Terrilyn Christians, PT, DPT 06/05/24 10:20 AM  [facility] [address] [place], [address] [place], [place] [address] Phone # [phone] Fax # [phone]

## 2024-06-05 ENCOUNTER — Encounter: Payer: Self-pay | Admitting: Physical Therapy

## 2024-06-05 ENCOUNTER — Ambulatory Visit

## 2024-06-05 ENCOUNTER — Ambulatory Visit: Admitting: Physical Therapy

## 2024-06-05 DIAGNOSIS — R278 Other lack of coordination: Secondary | ICD-10-CM | POA: Diagnosis not present

## 2024-06-05 DIAGNOSIS — R29898 Other symptoms and signs involving the musculoskeletal system: Secondary | ICD-10-CM

## 2024-06-05 DIAGNOSIS — R2689 Other abnormalities of gait and mobility: Secondary | ICD-10-CM | POA: Diagnosis not present

## 2024-06-05 DIAGNOSIS — R29818 Other symptoms and signs involving the nervous system: Secondary | ICD-10-CM | POA: Diagnosis not present

## 2024-06-05 DIAGNOSIS — M6281 Muscle weakness (generalized): Secondary | ICD-10-CM

## 2024-06-05 DIAGNOSIS — R471 Dysarthria and anarthria: Secondary | ICD-10-CM | POA: Diagnosis not present

## 2024-06-05 DIAGNOSIS — R41840 Attention and concentration deficit: Secondary | ICD-10-CM

## 2024-06-05 DIAGNOSIS — R2681 Unsteadiness on feet: Secondary | ICD-10-CM

## 2024-06-05 DIAGNOSIS — R490 Dysphonia: Secondary | ICD-10-CM | POA: Diagnosis not present

## 2024-06-05 NOTE — Therapy (Addendum)
 OUTPATIENT OCCUPATIONAL THERAPY [facility]IN[facility]  Treatment Note  Patient Name: Peter Novak MRN: [id number] DOB:[birth year], 73 y.o., male Today's Date: 06/05/2024  PCP: Katrinka Garnette KIDD, MD REFERRING PROVIDER: Evonnie Asberry RAMAN, DO  END OF SESSION:   06/05/24 1224  OT Visits / Re-Eval  Visit Number 7  Number of Visits 8  Date for Recertification  06/07/24  Authorization  Authorization Type [employer]  Authorization Time Period 8 visits 04/25/2024 - 07/24/2024  Authorization - Visit Number 7  Authorization - Number of Visits 8  OT Time Calculation  OT Start Time 1016  OT Stop Time 1055  OT Time Calculation (min) 39 min  End of Session  Activity Tolerance Patient tolerated treatment well  Behavior During Therapy WFL for tasks assessed/performed          Past Medical History:  Diagnosis Date   ADD (attention deficit disorder with hyperactivity)    Allergy    seasonal   Arthritis    Cancer (HCC)    prostate cancer   Cataract    bilateral,removed   Complication of anesthesia    Constipation    GERD (gastroesophageal reflux disease)    GERD (gastroesophageal reflux disease)    Hearing loss    History of skin cancer    Melanoma X 2   Hyperlipidemia    Lyme disease 01/2021   Prostate cancer (HCC) 2016   Past Surgical History:  Procedure Laterality Date   CATARACT EXTRACTION, BILATERAL  March 2022 and April 2022   CHOLECYSTECTOMY  2005   COLONOSCOPY  2008   negative   ELBOW SURGERY Right 1994   ulnar transposition   EYE SURGERY  2021   cataract   HERNIA REPAIR  2005   INNER EAR SURGERY  2006   Incus transposition;oscillculoplasty; Dr Thaddeus GRIST EAR SURGERY  2011   Stirrup resection   LYMPHADENECTOMY Bilateral 11/04/2021   Procedure: LYMPHADENECTOMY, PELVIC;  Surgeon: Renda Glance, MD;  Location: [facility];  Service: Urology;  Laterality: Bilateral;   POLYPECTOMY     ROBOT ASSISTED LAPAROSCOPIC RADICAL PROSTATECTOMY N/A 11/04/2021   Procedure:  XI ROBOTIC ASSISTED LAPAROSCOPIC RADICAL PROSTATECTOMY LEVEL 3;  Surgeon: Renda Glance, MD;  Location: [facility];  Service: Urology;  Laterality: N/A;   SHOULDER ARTHROSCOPY Left 2004   Dr Liam   TONSILLECTOMY AND ADENOIDECTOMY     VASECTOMY     Patient Active Problem List   Diagnosis Date Noted   Parkinson's disease without dyskinesia or fluctuating manifestations (HCC) 04/08/2024   Fatigue 01/16/2024   Excessive daytime sleepiness 01/16/2024   Genetic testing 09/21/2021   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   Bradycardia 11/05/2019   Recurrent episodes Dizziness/Ataxia 11/05/2019   White matter abnormality on MRI of brain 04/16/2019   Migraine with aura and with status migrainosus, not intractable 04/16/2019   Ocular migraine 04/16/2019   Migraine aura without headache (migraine equivalents) 04/16/2019   ADD (attention deficit disorder) 10/02/2014   Insomnia 10/02/2014   Erectile dysfunction 10/02/2014   Osteoarthritis 10/02/2014   Eustachian tube dysfunction 10/02/2014   GERD (gastroesophageal reflux disease) 02/12/2013   Prostate cancer (HCC) 02/12/2013   Benign neoplasm of adrenal gland 11/30/2009   Hyperglycemia 11/30/2009   History of melanoma 11/30/2009   HYPERLIPIDEMIA 07/10/2007    ONSET DATE: referral date 04/16/24  REFERRING DIAG: R68.89 (ICD-10-CM) - Difficulty writing  THERAPY DIAG:  Other lack of coordination  Muscle weakness (generalized)  Attention and concentration deficit  Other symptoms and signs involving the musculoskeletal system  Dysarthria and anarthria  Rationale for Evaluation and Treatment: Rehabilitation  SUBJECTIVE:   SUBJECTIVE STATEMENT: Pt reports having a good time at the beach, reports no pain this date.  Pt accompanied by: self  PERTINENT HISTORY: ADD, prostate CA, HLD, lyme disease   PRECAUTIONS: None  WEIGHT BEARING RESTRICTIONS: No  PAIN:  Are you having pain? No  FALLS: Has patient fallen in last 6 months?  No  LIVING ENVIRONMENT: Lives with: lives with their spouse Lives in: House/apartment Stairs: 1 step to enter; 1 story home Has following equipment at home: Tour manager, and Grab bars  PLOF: Independent, Vocation/Vocational requirements: retired, and Leisure: reading, outdoors, walking a mile after supper  PATIENT GOALS: see if I can get better/extend working time on [employer]  OBJECTIVE:  Note: Objective measures were completed at Evaluation unless otherwise noted.  HAND DOMINANCE: Right  ADLs: Overall ADLs: it takes longer Transfers/ambulation related to ADLs: no AD Eating: difficulty with opening small cap on square milk cartons Grooming: Mod I, increased time when shaving UB Dressing: buttons can be difficult LB Dressing: occasional difficulty getting pants Toileting: Mod I Bathing: Mod I Tub Shower transfers: Mod I Equipment: Walk in shower and built in bench in shower  IADLs: Light housekeeping: Mod I, still cutting grass Meal Prep: Mod I Community mobility: Mod I Medication management: Mod I Financial management: Mod I Handwriting: 100% legible, Mild micrographia, and 12.60 sec (whales live in a blue ocean.)  MOBILITY STATUS: Independent  POSTURE COMMENTS:  rounded shoulders, forward head, and weight shift left  ACTIVITY TOLERANCE: Activity tolerance: diminished  FUNCTIONAL OUTCOME MEASURES: Fastening/unfastening 3 buttons: 23.53 sec Physical performance test: PPT#2 (simulated eating) 11.56 & PPT#4 (donning/doffing jacket): TBD  COORDINATION: 9 Hole Peg test: Right: 29.25 sec; Left: 28.69 sec Box and Blocks:  Right 49 blocks, Left 49 blocks Alternating pronation/supination: slightly slowed B Alternating toe tap: hypokinesia L LE Finger to nose: WNL B  UE ROM:  WFL  UE MMT:   WFL Grip strength: Right: 50 and 54# and Left: 60 and 65#  SENSATION: Pt denies N/T in UEs/LEs  COGNITION: Overall cognitive status: Within functional limits for tasks  assessed  OBSERVATIONS: Bradykinesia                                                                                                                    TREATMENT DATE:  06/05/24 -Reassessed goals, see goals for detailed measurements. Discussed with pt noted improvements and noted declines. Pt at this time requesting to be placed on hold to allow for practicing of educated techniques and HEPs on his own and to return to be re-assessed.   -educated pt in obtaining AE for opening bottles and caps, showed adaptive bottle opener on [employer], pt took picture for reference. Pt expressed interest in obtaining.  06/03/24 Handwriting/typing: engaged in discussion about impact of arthritis and PD on quality of movement, especially with typing. Educating on setup of work space to ensure improved alignment and decreased pressure on joints. Pt reports  that he feels stiff and has decreased control after ~10 mins of typing. Tendon gliding: OT educating on tendon gliding with hook fist, full fist, flat fist, and table top for motor control to aid in typing. Coordination: engaged in picking up coins one at a time, translating into palm of hand to hold 5-8 coins and then translating back to finger tips to place into coin slot.  OT educating on carryover of intrinsic hand movements to quality of movement with typing, handwriting, and other fine motor tasks.   9 hole peg test: R: 29.31, 25.31 sec, 24.25 sec (average: 25.29 sec); L: 27.66, 33.0, 26.84 (average: 29.17 seconds) Self-care: educated on typical therapy program with PD with recommendation for screen after completion of OT appts.  OT encouraging pt to establish an exercise routine at home and to attend PD programming for carryover of exercises outside of therapy appts.  Pt still with multiple questions about his diagnosis as well as concerns with handwriting/typing.  Pt in agreement with plan to assess goals at next session with potential plan to take a 3-4 week  hold on therapy to focus on establishing a home routine and then to return to assess if maintaining progress with fine motor tasks and quality of movement.   05/23/24 Large amplitude hands: engaged in finger flicks with full hand opening and thumb opposition with finger extension. OT instructing in forearm supination/pronation as well as wrist flexion/extension with large amplitude as well.  OT providing initial cue, pt then completing with good amplitude. Buttons: 23.75 sec and 18.91 sec with pt demonstrating increased attention to hand placement and purposeful movements during 2nd attempt. Coordination: completed bilaterally with focus on big, intentional movements.   Pick up coins and stack one at a time Pick up coins and rotate 360* Pick up 5-10 coins one at a time and hold in palm. Then, move coins from palm to fingertips one at time and place in coin bank/container. 9 hole peg test: R: 34.47 sec and L: 34.16 sec    05/21/24 Large amplitude: engaged in finger flicks with full hand opening and thumb opposition with finger extension. OT providing initial cue, pt then completing with good amplitude. Handwriting: provided with handout of recommendations from [facility] to aid in legibility with handwriting. Trialed multiple suggestions with focus on printing, use of weighted pen, and use of built up handle/pen grip. Pt demonstrating improved legibility with slightly weighted pen and when focusing on sizing and spacing.   Modifications: educated on modifications and adaptations to computer setup as well as wear of weighted gloves for improved motor control and to decrease impact of tremors on mouse use or when writing.  Pt with bradykinesia and micrographia but no tremors during activities at this time.      PATIENT EDUCATION: Education details: Progress with goals, AE for opening bottles and caps Person educated: Patient Education method: Explanation, Verbal cues, and  Handouts Education comprehension: verbalized understanding and needs further education  HOME EXERCISE PROGRAM: Access Code: [facility] URL: [email or web address] Date: 06/03/2024 Prepared by: [facility] - Outpatient  Rehab - [facility]  Exercises - Seated Finger Flicks with Elbow Extension  - 7 x weekly - 1 sets - 10 reps - Seated Reach Forward, Up, and To Sides  - 7 x weekly - 1 sets - 10 reps - Open Book Chest Stretch on Towel Roll  - 7 x weekly - 1 sets - 10 reps - Shoulder Single Arm PNF D2 Flexion with Resistance  - 2  x daily - 3 x weekly - 2 sets - 10 reps - Seated Shoulder Flexion with Self-Anchored Resistance  - 2 x daily - 3 x weekly - 2 sets - 10 reps - Single Arm Shoulder Extension with Anchored Resistance  - 2 x daily - 3 x weekly - 2 sets - 10 reps - Seated Digit Tendon Gliding  - 2 x daily - 10 reps  GOALS: Goals reviewed with patient? Yes  SHORT TERM GOALS: Target date: 05/17/24  Pt will be Independent in PD specific HEP. Baseline: new PD diagnosis Goal status: in progress  2.  Pt will verbalize understanding of adapted strategies to maximize safety and Independence with ADLs/ IADLs . Baseline: difficulty with buttons, putting on pants, opening caps and bottles, wiping down shower Goal status: in progress  3.  Pt will verbalize and demonstrate improved quality of handwriting by writing a short paragraph with 100% legibility and no significant decrease in letter size. Baseline: mild micrographia, 100% legible Goal status: in progress  4.  Pt will verbalize increased tolerance to typing and use of adaptive strategies PRN to increase success with typing longer passages. Baseline: hand fatigue after ~5 mins of typing 06/05/24: Pt still reports fatigue after ~5 minutes of typing Goal status: in progress   LONG TERM GOALS: Target date: 06/07/24  Pt will demonstrate improved fine motor coordination for ADLs as evidenced by decreasing 9 hole peg test  score for BUE by 3 secs Baseline: Right: 29.25 sec; Left: 28.69 sec 05/23/24: R: 34.47 sec and L: 34.16 sec 06/03/24: R: 29.31, 25.31 sec, 24.25 sec (average: 25.29 sec);; L: 27.66, 33.0, 26.84 (average 29.17 sec) 06/05/24: L: 30.66, 27.97, 29.10 (average: 29.24 sec) Goal status: MET on R 06/03/24; in progress with Left  2.  Pt will demonstrate improved UE functional use for ADLs as evidenced by increasing box/ blocks score by 5 blocks with RUE/LUE Baseline: R: 49 and L: 49 blocks 06/05/24: R: 50 blocks and L: 47 blocks Goal status: in progress  3.  Pt will demonstrate improved ease with fastening buttons as evidenced by decreasing 3 button/ unbutton time to 20 secs or less Baseline: 23.53 sec 05/24/24: 23.75 sec and 18.91 sec 06/05/24: 29.50 sec, 19.42 seconds second trial Goal status: GOAL MET  4.  Pt will verbalize understanding of ways to prevent future PD related complications and PD community resources. Baseline: new PD diagnosis Goal status: GOAL MET; pt verbalizes understanding of community resources.    ASSESSMENT:  CLINICAL IMPRESSION: Patient is a 73 y.o. male who was seen today for occupational therapy treatment for impairments in dexterity with handwriting/typing and ADLs due to PD.  Pt benefiting from education on intentional, purposeful movements during fine and gross motor tasks. Pt pleasant and participatory throughout tasks. Discussed with pt improvements and declines noted as well as pros and cons of taking a break. Pt requested 3-4 week break to allow for practicing at home stating I understand the things I need to work on now. Pt agreeable to returning and if measurements stay stable then he will be d/c and will return in 6-8 months for PD screening.   PERFORMANCE DEFICITS: in functional skills including ADLs, IADLs, coordination, strength, Fine motor control, Gross motor control, body mechanics, endurance, decreased knowledge of precautions, and UE functional use and  psychosocial skills including coping strategies and routines and behaviors.   IMPAIRMENTS: are limiting patient from ADLs, IADLs, and leisure.    PLAN:  OT FREQUENCY: 1-2x/week  OT DURATION: 6  weeks  PLANNED INTERVENTIONS: 97168 OT Re-evaluation, 97535 self care/ADL training, 02889 therapeutic exercise, 97530 therapeutic activity, 97112 neuromuscular re-education, 97750 Physical Performance Testing, functional mobility training, psychosocial skills training, energy conservation, coping strategies training, patient/family education, and DME and/or AE instructions  RECOMMENDED OTHER SERVICES: SLP eval  CONSULTED AND AGREED WITH PLAN OF CARE: Patient  PLAN FOR NEXT SESSION: Pt to return in 3-4 weeks, notified front desk. F/u on obtaining bottle opener, re-assess coordination, writing, and typing. May need to be re-certified if any decline, d/c if measurements are consistent.    Rocky Dutch, OTR/L 06/05/2024, 10:18 AM   [facility] at [facility] [address] [place], [address] [place], [place] [address] Phone # [phone] Fax # [phone]

## 2024-06-10 ENCOUNTER — Ambulatory Visit

## 2024-06-10 ENCOUNTER — Ambulatory Visit: Admitting: Physical Therapy

## 2024-06-10 ENCOUNTER — Ambulatory Visit: Admitting: Occupational Therapy

## 2024-06-10 ENCOUNTER — Other Ambulatory Visit: Payer: Self-pay | Admitting: Family Medicine

## 2024-06-10 ENCOUNTER — Encounter: Payer: Self-pay | Admitting: Physical Therapy

## 2024-06-10 DIAGNOSIS — M6281 Muscle weakness (generalized): Secondary | ICD-10-CM | POA: Diagnosis not present

## 2024-06-10 DIAGNOSIS — R2681 Unsteadiness on feet: Secondary | ICD-10-CM | POA: Diagnosis not present

## 2024-06-10 DIAGNOSIS — R2689 Other abnormalities of gait and mobility: Secondary | ICD-10-CM | POA: Diagnosis not present

## 2024-06-10 DIAGNOSIS — R29818 Other symptoms and signs involving the nervous system: Secondary | ICD-10-CM | POA: Diagnosis not present

## 2024-06-10 DIAGNOSIS — R471 Dysarthria and anarthria: Secondary | ICD-10-CM

## 2024-06-10 DIAGNOSIS — R41840 Attention and concentration deficit: Secondary | ICD-10-CM | POA: Diagnosis not present

## 2024-06-10 DIAGNOSIS — R29898 Other symptoms and signs involving the musculoskeletal system: Secondary | ICD-10-CM | POA: Diagnosis not present

## 2024-06-10 DIAGNOSIS — R278 Other lack of coordination: Secondary | ICD-10-CM | POA: Diagnosis not present

## 2024-06-10 DIAGNOSIS — R490 Dysphonia: Secondary | ICD-10-CM

## 2024-06-10 NOTE — Therapy (Signed)
 OUTPATIENT PHYSICAL THERAPY NEURO TREATMENT NOTE   Patient Name: Peter Novak MRN: [id number] DOB:03/04/1951, 73 y.o., male Today's Date: 06/10/2024   PCP: Katrinka Garnette KIDD, MD  REFERRING PROVIDER: Evonnie Asberry RAMAN, DO      END OF SESSION:  PT End of Session - 06/10/24 1025     Visit Number 14    Number of Visits 21    Date for Recertification  06/28/24   per recert 05/21/2024   Authorization Type [employer]    Authorization Time Period Auth#: [id number] , approved 8 additional visits from 06/06/2024 - 06/28/2024    Authorization - Visit Number 1    Authorization - Number of Visits 8    PT Start Time 1026   coming late from speech appt   PT Stop Time 1106    PT Time Calculation (min) 40 min    Equipment Utilized During Treatment Gait belt    Activity Tolerance Patient tolerated treatment well    Behavior During Therapy WFL for tasks assessed/performed                      Past Medical History:  Diagnosis Date   ADD (attention deficit disorder with hyperactivity)    Allergy    seasonal   Arthritis    Cancer (HCC)    prostate cancer   Cataract    bilateral,removed   Complication of anesthesia    Constipation    GERD (gastroesophageal reflux disease)    GERD (gastroesophageal reflux disease)    Hearing loss    History of skin cancer    Melanoma X 2   Hyperlipidemia    Lyme disease 01/2021   Prostate cancer (HCC) 2016   Past Surgical History:  Procedure Laterality Date   CATARACT EXTRACTION, BILATERAL  March 2022 and April 2022   CHOLECYSTECTOMY  2005   COLONOSCOPY  2008   negative   ELBOW SURGERY Right 1994   ulnar transposition   EYE SURGERY  2021   cataract   HERNIA REPAIR  2005   INNER EAR SURGERY  2006   Incus transposition;oscillculoplasty; Dr Thaddeus GRIST EAR SURGERY  2011   Stirrup resection   LYMPHADENECTOMY Bilateral 11/04/2021   Procedure: LYMPHADENECTOMY, PELVIC;  Surgeon: Renda Glance, MD;  Location: [facility];   Service: Urology;  Laterality: Bilateral;   POLYPECTOMY     ROBOT ASSISTED LAPAROSCOPIC RADICAL PROSTATECTOMY N/A 11/04/2021   Procedure: XI ROBOTIC ASSISTED LAPAROSCOPIC RADICAL PROSTATECTOMY LEVEL 3;  Surgeon: Renda Glance, MD;  Location: [facility];  Service: Urology;  Laterality: N/A;   SHOULDER ARTHROSCOPY Left 2004   Dr Liam   TONSILLECTOMY AND ADENOIDECTOMY     VASECTOMY     Patient Active Problem List   Diagnosis Date Noted   Parkinson's disease without dyskinesia or fluctuating manifestations (HCC) 04/08/2024   Fatigue 01/16/2024   Excessive daytime sleepiness 01/16/2024   Genetic testing 09/21/2021   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   Bradycardia 11/05/2019   Recurrent episodes Dizziness/Ataxia 11/05/2019   White matter abnormality on MRI of brain 04/16/2019   Migraine with aura and with status migrainosus, not intractable 04/16/2019   Ocular migraine 04/16/2019   Migraine aura without headache (migraine equivalents) 04/16/2019   ADD (attention deficit disorder) 10/02/2014   Insomnia 10/02/2014   Erectile dysfunction 10/02/2014   Osteoarthritis 10/02/2014   Eustachian tube dysfunction 10/02/2014   GERD (gastroesophageal reflux disease) 02/12/2013   Prostate cancer (HCC) 02/12/2013   Benign neoplasm of adrenal gland  11/30/2009   Hyperglycemia 11/30/2009   History of melanoma 11/30/2009   HYPERLIPIDEMIA 07/10/2007    ONSET DATE: April 2025   REFERRING DIAG: G20.A1 (ICD-10-CM) - Parkinson's disease without dyskinesia or fluctuating manifestations (HCC)  THERAPY DIAG:  Unsteadiness on feet  Muscle weakness (generalized)  Other abnormalities of gait and mobility  Rationale for Evaluation and Treatment: Rehabilitation  SUBJECTIVE:                                                                                                                                                                                             SUBJECTIVE STATEMENT: Was a sleepy  weekend.    Pt accompanied by: self  PERTINENT HISTORY: ADD, prostate CA, HLD, lyme disease  PAIN:  Are you having pain? No  PRECAUTIONS: None  RED FLAGS: None   WEIGHT BEARING RESTRICTIONS: No  FALLS: Has patient fallen in last 6 months? No  LIVING ENVIRONMENT: Lives with: lives with their spouse Lives in: House/apartment Stairs: 1 step to enter; 1 story home Has following equipment at home: Single point cane, Tour manager, and Grab bars  PLOF: Independent, Vocation/Vocational requirements: retired, and Leisure: reading, outdoors, walking a mile after supper  PATIENT GOALS: I don't have any idea  OBJECTIVE:   Discussed PWR! Moves classes-  TODAY'S TREATMENT: 06/10/2024 Activity Comments  PWR! Moves Quadruped: PWR! UP x 5 PWR! Rock x 5 PWR! Twist x 5 PWR! Steps   Visual, verbal cues for technique, intensity; provided rationale for quadruped PWR! Moves  Standing BLE strengthening: Hip abduction x 10 Hip extension x 10 Hip flexion x 10 At weightrack, 10#, cues for eccentric control  Seated hamstring curls, x 10 10# at weightrack  LAQ, x 10 BLE 5#          PATIENT EDUCATION: Education details: Review edu on PWR moves class ([employer]), encouraged joining gym  Person educated: Patient Education method: Programmer, multimedia, Facilities manager, Actor cues, Verbal cues, and Handouts Education comprehension: verbalized understanding and returned demonstration    Access Code: [email or web address] URL: [email or web address] Date: 06/03/2024 Prepared by: [facility]  Rehab - [facility]  Program Notes perform balance exercises in a corner for safety ADDED PWR! Moves in standing 10-20 reps daily  Exercises - Single Leg Heel Raise with Unilateral Counter Support  - 1 x daily - 5 x weekly - 2 sets - 10 reps - Squat with Chair Touch  - 1 x daily - 5 x weekly - 2 sets - 10 reps - Side Stepping with Resistance at Ankles and Counter Support   - 1 x daily - 5 x weekly -  2 sets - 1 min hold - Romberg Stance with Eyes Closed  - 1 x daily - 5 x weekly - 2-3 sets - 30 sec hold - Romberg Stance with Head Rotation  - 1 x daily - 5 x weekly - 2-3 sets - 30 sec hold - Step forward/Backward-Balance Reaction  - 1 x daily - 5 x weekly - 2 sets - 10 reps      Note: Objective measures were completed at Evaluation unless otherwise noted.  DIAGNOSTIC FINDINGS: 12/13/23 brain MRI: T2 FLAIR hyperintense signal changes within the cerebral white matter (moderate) and pons (mild), similar to the prior brain MRI of 05/10/2022. Findings are nonspecific and differential considerations include chronic small vessel ischemic disease, sequelae of a prior infectious/inflammatory process and sequelae of demyelinating disease, among others.   COGNITION: Overall cognitive status: Within functional limits for tasks assessed   SENSATION: Pt denies N/T in UEs/LEs  COORDINATION: Alternating pronation/supination: slightly slowed B Alternating toe tap: hypokinesia L LE Finger to nose: WNL B   MUSCLE TONE: rigidity in L LE, especially in ankle   POSTURE: rounded shoulders, forward head, and weight shift left  LOWER EXTREMITY ROM:     Active  Right Eval Left Eval  Hip flexion    Hip extension    Hip abduction    Hip adduction    Hip internal rotation    Hip external rotation    Knee flexion    Knee extension    Ankle dorsiflexion 10 3  Ankle plantarflexion    Ankle inversion    Ankle eversion     (Blank rows = not tested)  LOWER EXTREMITY MMT:    MMT (in sitting) Right Eval Left Eval  Hip flexion 4+ 4+  Hip extension    Hip abduction 3+ 3+  Hip adduction 4 4  Hip internal rotation    Hip external rotation    Knee flexion 4+ 4  Knee extension 4 4  Ankle dorsiflexion 5 5  Ankle plantarflexion 5 5  Ankle inversion    Ankle eversion    (Blank rows = not tested)  GAIT: Findings: Assistive device utilized:None, Level of  assistance: Complete Independence, and Comments: reduced R arm swing  FUNCTIONAL TESTS:  5 times sit to stand: 18.42 sec without UEs 10 meter walk test: 9.21 sec  without AD  (3.56 ft/sec)                                                                                                                              TREATMENT DATE: 04/15/24     PATIENT EDUCATION: Education details: orthostatic hypotension -- monitoring, management and to get medical management Education method: Explanation, Demonstration, Tactile cues, Verbal cues, and Handouts Education comprehension: verbalized understanding  HOME EXERCISE PROGRAM: Not yet initiated   GOALS: Goals reviewed with patient? Yes  SHORT TERM GOALS: Target date: 05/06/2024  Patient to be independent with initial HEP. Baseline: HEP initiated; reports consistent  performance of HEP, 05/13/2024 Goal status: MET    LONG TERM GOALS: Target date: 05/27/2024>>UPDATED TARGET 06/28/2024  Patient to be independent with advanced HEP. Baseline: Needs to be updated, 05/21/24 Goal status: IN PROGRESS  Patient to verbalize understanding of local Parkinson's Disease community resources including community fitness post D/C.   Baseline: pt received and reported understanding  Goal status: MET   Patient to improve MiniBestTest score to atleast 17-21 to decrease risk of falls.  Baseline: 19 04/17/24 >24/28 05/21/2024 Goal status: MET 05/21/24  Patient to demonstrate 50% improvement in R UE swing during gait.  Baseline: decreased UE swing; much improved 06/05/24 Goal status: MET 06/05/24  Patient to demonstrate 5xSTS test in <15 sec in order to decrease risk of falls.   Baseline: 18 sec>17.5 sec; 15.37 sec without UEs 06/05/24 Goal status: IN PROGRESS 06/05/24  Patient to report participation in weekly exercise class. Baseline: Not yet initiated; looking into [facility] gym 06/05/24 Goal status: IN PROGRESS 06/05/24  TUG/TUG Cognitive score to be < or equal  to 10% difference for improved dual task with gait. Baseline: 9.4, 11.25 sec; 24% increase 06/05/24  Goal status:  IN PROGRESS 06/05/24   Push and release test backwards and lateral, to recover in 1-2 steps. Baseline:  3-4 steps; 2 steps required in each direction 06/05/24 Goal status:  MET 06/05/24   ASSESSMENT:  CLINICAL IMPRESSION: Pt presents today and reports he has not yet joined gym.  Answered pt's questions about [employer] and support he would receive from trainers to set up initial gym program as well as PD-related questions. Pt does indicate interest in [facility]! Moves class, so trialed quadruped PWR! Moves today in addition to BLE strengthening work. He does quadruped position well with cues.  With standing hip strengthening, he needs cues for eccentric control; he has most challenge with L hip abduction.   Pt will continue to benefit from skilled PT towards goals for improved functional mobility and decreased fall risk.  OBJECTIVE IMPAIRMENTS: Abnormal gait, decreased activity tolerance, decreased balance, decreased coordination, decreased endurance, decreased ROM, decreased strength, impaired tone, and postural dysfunction.   ACTIVITY LIMITATIONS: carrying, lifting, bending, sitting, standing, squatting, stairs, transfers, bed mobility, bathing, dressing, reach over head, and hygiene/grooming  PARTICIPATION LIMITATIONS: meal prep, cleaning, laundry, community activity, yard work, and church  PERSONAL FACTORS: Age, Past/current experiences, Time since onset of injury/illness/exacerbation, and 3+ comorbidities: ADD, prostate CA, HLD, lyme disease are also affecting patient's functional outcome.   REHAB POTENTIAL: Good  CLINICAL DECISION MAKING: Evolving/moderate complexity  EVALUATION COMPLEXITY: Moderate  PLAN:  PT FREQUENCY: 1-2x/week  PT DURATION: 6 weeks per recert 05/21/2024  PLANNED INTERVENTIONS: 97164- PT Re-evaluation, 97750- Physical Performance Testing,  97110-Therapeutic exercises, 97530- Therapeutic activity, 97112- Neuromuscular re-education, 97535- Self Care, [id number]- Manual therapy, [phone]- Gait training, [phone]- Canalith repositioning, Patient/Family education, Balance training, Stair training, Taping, Vestibular training, Cryotherapy, and Moist heat  PLAN FOR NEXT SESSION:  Continue to work with patient on strength training gym HEP to initiate at [facility]. He plans to join  *this week. Review HEP addition and continue resisted gait. PWR! Moves prone and supine (in preparation for possibly joining [facility] class); EC balance, compliant surface, narrow BOS, and balance with head turns    Greig Anon, PT 06/10/24 11:14 AM Phone: [phone] Fax: [phone]  [facility] at [facility] [address], [address] [place], [place] [address] Phone # [phone] Fax # [phone]

## 2024-06-10 NOTE — Therapy (Signed)
 OUTPATIENT SPEECH LANGUAGE PATHOLOGY TREATMENT   Patient Name: Peter Novak MRN: [id number] DOB:1951/07/13, 73 y.o., male Today's Date: 06/10/2024  PCP: Katrinka Senior, MD REFERRING PROVIDER: Evonnie Asberry RAMAN, DO  END OF SESSION:  End of Session - 06/10/24 1235     Visit Number 2    Number of Visits 7    Date for Recertification  07/04/23    Authorization Type [employer]    SLP Start Time [phone]    SLP Stop Time  0934    SLP Time Calculation (min) 45 min    Activity Tolerance Patient tolerated treatment well           Past Medical History:  Diagnosis Date   ADD (attention deficit disorder with hyperactivity)    Allergy    seasonal   Arthritis    Cancer (HCC)    prostate cancer   Cataract    bilateral,removed   Complication of anesthesia    Constipation    GERD (gastroesophageal reflux disease)    GERD (gastroesophageal reflux disease)    Hearing loss    History of skin cancer    Melanoma X 2   Hyperlipidemia    Lyme disease 01/2021   Prostate cancer (HCC) 2016   Past Surgical History:  Procedure Laterality Date   CATARACT EXTRACTION, BILATERAL  March 2022 and April 2022   CHOLECYSTECTOMY  2005   COLONOSCOPY  2008   negative   ELBOW SURGERY Right 1994   ulnar transposition   EYE SURGERY  2021   cataract   HERNIA REPAIR  2005   INNER EAR SURGERY  2006   Incus transposition;oscillculoplasty; Dr Thaddeus GRIST EAR SURGERY  2011   Stirrup resection   LYMPHADENECTOMY Bilateral 11/04/2021   Procedure: LYMPHADENECTOMY, PELVIC;  Surgeon: Renda Glance, MD;  Location: [facility];  Service: Urology;  Laterality: Bilateral;   POLYPECTOMY     ROBOT ASSISTED LAPAROSCOPIC RADICAL PROSTATECTOMY N/A 11/04/2021   Procedure: XI ROBOTIC ASSISTED LAPAROSCOPIC RADICAL PROSTATECTOMY LEVEL 3;  Surgeon: Renda Glance, MD;  Location: [facility];  Service: Urology;  Laterality: N/A;   SHOULDER ARTHROSCOPY Left 2004   Dr Liam   TONSILLECTOMY AND ADENOIDECTOMY     VASECTOMY      Patient Active Problem List   Diagnosis Date Noted   Parkinson's disease without dyskinesia or fluctuating manifestations (HCC) 04/08/2024   Fatigue 01/16/2024   Excessive daytime sleepiness 01/16/2024   Genetic testing 09/21/2021   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   Bradycardia 11/05/2019   Recurrent episodes Dizziness/Ataxia 11/05/2019   White matter abnormality on MRI of brain 04/16/2019   Migraine with aura and with status migrainosus, not intractable 04/16/2019   Ocular migraine 04/16/2019   Migraine aura without headache (migraine equivalents) 04/16/2019   ADD (attention deficit disorder) 10/02/2014   Insomnia 10/02/2014   Erectile dysfunction 10/02/2014   Osteoarthritis 10/02/2014   Eustachian tube dysfunction 10/02/2014   GERD (gastroesophageal reflux disease) 02/12/2013   Prostate cancer (HCC) 02/12/2013   Benign neoplasm of adrenal gland 11/30/2009   Hyperglycemia 11/30/2009   History of melanoma 11/30/2009   HYPERLIPIDEMIA 07/10/2007    ONSET DATE: Script 04/08/24  REFERRING DIAG:  G20.A1 (ICD-10-CM) - Parkinson's disease without dyskinesia or fluctuating manifestations (HCC)    THERAPY DIAG:  Dysarthria and anarthria  Dysphonia  Rationale for Evaluation and Treatment: Rehabilitation  SUBJECTIVE:   SUBJECTIVE STATEMENT: I'm taking the medicine (for PD) at 7:30-8, 12:30, and 9pm.  Pt accompanied by: self  PERTINENT HISTORY: ADD, prostate  CA, HLD, lyme disease. Pt had evaluation with ST at [address] [facility]) and then did not complete therapy course.   PAIN:  Are you having pain? No  FALLS: Has patient fallen in last 6 months?  See PT evaluation for details   PATIENT GOALS: Normalize voice quality  OBJECTIVE:  Note: Objective measures were completed at Evaluation unless otherwise noted.  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  Asymmetric hearing loss with the right ear worse than the left. The right ear shows air-bone gap and mixed hearing loss, could be due to prosthesis displacement or scar tissue. The left ear has primarily sensorineural hearing loss. Discussed CROS hearing aid if no surgical options are available. CT Temporal bone scan will help assess the ossicular chain and determine the cause of hearing loss. - Refer to Dr. Tobie for further evaluation and potential surgical options discussion - Order dedicated temporal bone scan to assess the ossicular chain/prosthesis location - Advised to bring previous audiograms to the appointment with Dr. Tobie to see if there was a significant change relative to the last Audio done through [facility]    Voice Changes/dysphonia Voice changes with improvement noted after voice therapy, likely related to thinning of the vocal cords and possible seasonal allergies and PND, GERD LPR. No current symptoms of cracking or raspy/strained voice. Continued use of allergy medication and Flonase , along with minimizing reflux, should help maintain voice quality. - Continue allergy medication and Flonase  - Advise on minimizing reflux with diet and lifestyle changes  - Schedule follow-up if voice issues recur in the future, continue voice therapy exercises    Right Submandibular Stone - CT neck reviewed with the patient  Small right submandibular stone (~1 cm) with gland atrophy, no mass or lymphadenopathy and no sialoadenitis or retention of saliva. Currently asymptomatic with no dry mouth or pain/SMG swelling on the right. No immediate intervention required unless symptoms develop. - Monitor for symptoms such as pain or dry mouth - No immediate intervention required   Chronic Fatigue Chronic fatigue with a history of Lyme disease and multiple normal blood work results. Reports easy napping and nocturnal urination due to prostate cancer. Discussed possibility of sleep  apnea and need for a sleep study. Explained Inspire as an option for those who fail CPAP and have moderate to severe apnea. - Recommend sleep study if symptoms persist - Contact primary care physician for repeat Lyme disease test   Follow-up - Schedule appointment with Dr. Tobie for hearing evaluation after CT T-bone - Contact primary care physician for Lyme disease test and for HST if needed in the future  - Schedule follow-up with me in 6 months for sx check   Patient is a 73 y.o. M who was seen today for voice evaluation upon referral from Dr. Okey with videostroboscopy demonstrating VF atrophy. Pt presents with mild hoarseness, occasional vocal strain, c/o vocal fatigue and sensation of tightness in throat when speaking. Pt evidences reduced coordination of voice subsystems by speaking on residual capacity and with intermittent pressed voice. Pt demonstrated habitual throat clearing throughout session, x14 over 45 minute session. Co-morbidities which likely impact voice quality include dx of GERD, seasonal allergies causing post nasal drip. Pt is on medications for both. Will plan to instruct for additional reflux precautions. Pt would benefit from ST to address dysphonia to  improve communication efficacy.   MR BRAIN WO CONT 12/13/23 IMPRESSION: 1.  No evidence of an acute intracranial abnormality. 2. T2 FLAIR hyperintense signal changes within the cerebral white matter (moderate) and pons (mild), similar to the prior brain MRI of 05/10/2022. Findings are nonspecific and differential considerations include chronic small vessel ischemic disease, sequelae of a prior infectious/inflammatory process and sequelae of demyelinating disease, among others.  Dr. Evonnie 04/08/24 1.  Parkinsons disease, new dx             - Skin biopsy positive for alpha-synuclein             - He and I discussed again that I would like to see him wean off the Xanax , particularly the daytime Xanax .  His wife and  daughter seem somewhat surprised that he was not on daytime Xanax , but the patient ultimately did admit that this was the case.    Discussed that Xanax  can cause confusion and gait instability and I do not like to see my parkinsonism patients on this medication, as Parkinson's alone can cause these problems.  He can talk with his prescribing NPP about this             -We decided to add carbidopa /levodopa  25/100.  1/2 tab tid x 1 wk, then 1/2 in am & noon & 1 at night for a week, then 1/2 in am &1 at noon &night for a week, then 1 po tid at 9 AM/1 PM/5 PM.  Risks, benefits, side effects and alternative therapies were discussed.  The opportunity to ask questions was given and they were answered to the best of my ability.  The patient expressed understanding and willingness to follow the outlined treatment protocols.             -I will refer the patient to the Parkinson's program for physical and speech therapy.  We talked about the importance of safe, cardiovascular exercise in Parkinson's disease.             -We discussed community resources in the area including patient support groups and community exercise programs for PD and pt education was provided to the patient.              -discussed MIND diet.             - Patient/wife/daughter had multiple questions and I answered those to the best of my ability today.   2.  Patient has had multiple acute episodes of expressive aphasia and trouble writing with negative stroke workup             - Given that he has had repetitive episodes over the years (actually over many years, nearly a decade) of inability to find words and trouble speaking followed by vision change, one has to wonder whether or not these are not complicated migraine.  Stroke workup in the past has been unremarkable.  Seizure also could be on the differential diagnosis, but is a bit less likely.             - Cardiology ordered zio patch and it came in the mail, but ultimately patient did  not want to proceed with that   3.  GAD             - I do think that the patient needs a Veterinary surgeon.  He and I discussed this today.             - He asked about being  irritated and a generalized feeling of frustration.  Ultimately, it was difficult for me to determine if this was either tremor, or if this was anxiety in general.  I told him to see if the levodopa  helped.  If not, he could follow-up with his psychiatry NPP   4.  Hyperreflexia             - Likely physiologic.  No abnormal reflexes.  May consider MRI cervical spine if neck pain develops or signs/symptoms of cervical myelopathy.  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   5.  Constipation             -discussed nature and pathophysiology and association with PD             -discussed importance of hydration.  Pt is to increase water  intake             - Discussed increased exercise.    PATIENT REPORTED OUTCOME MEASURES (PROM): VHI: or VR-QoL to be provided in first session                                                                                                                            TREATMENT DATE:  If treatment provided during day of evaluation a therapy charge was not submitted due to therapy not being authorized at time of evaluation.  Abdominal breathing=AB  06/10/24: First available appointment pt scheduled after evaluation. Status remains unchanged since eval with lower intensity speech volume.Given pt's S statement SLP reviewed his medication schedule from Dr. Collie last note. Pt appeared surprised at the schedule and said he would do what he could to adhere and voiced some apprehension about timing of medication and administration of PD meds. SLP printed off information from [employer] PD Association about food (https://www.apdaparkinson.org/article/levodopa -dosing-and-food-intake/). Pt voiced  concern about other dimensions of PD that are going to hit me upside the head in the future. SLP educated pt about possible cognitive changes and digestion/bowel changes. SLP referred pt to his referring MD about other questions about medication administration. SLP suggested talking to someone with a neuro AND psychology background about his thoughts and feelings about PD. Pt practiced ah about 3 times a week. SLP told pt he needed to do at LEAST 4 days a week for maximizing benefit. SLP then educated pt about rationale for AB and taught pt how to achieve AB using SLP model and usual verbal cues faded to rare min cues. Told pt to practice AB at rest in seated position at least 12 minutes twice a day until next session.   05/10/24: SLP introduced rationale of PhoRTE and of speech with intent, purpose, and mindfulness. SLP taught pt first task in PhoRTE (loud /a/) and instructed pt to complete 10 BID.   PATIENT EDUCATION: Education details: see treatment date Person educated: Patient Education method: Explanation, Demonstration, and Verbal cues  Education comprehension: verbalized understanding, returned demonstration, verbal cues required, and needs further education  HOME EXERCISE PROGRAM: PhoRTE   GOALS: Goals reviewed with patient? Yes  SHORT TERM GOALS: Target date: 07/12/24 (first ST 06/10/24)  Pt will perform PhoRTE with rare min A in two sessions Baseline: Goal status: INITIAL  2.  Pt will report improvement in PROM after 4 weeks ST Baseline:  Goal status: INITIAL  3.  Pt will perform AB at rest 80% of the time, 2 sessions Baseline:  Goal status: INITIAL   LONG TERM GOALS: Target date: 07/26/24  Pt will improve PROM compared to PROM  Baseline:  Goal status: INITIAL  2.  Pt will perform AB with 8 minutes simple conversation 80% of the time, 2 sessions Baseline:  Goal status: INITIAL  3.  Pt will perform PhoRTE with mod I in two sessions Baseline:  Goal status:  INITIAL   ASSESSMENT:  CLINICAL IMPRESSION: Patient is a 73 y.o. M who was seen today for treatment for decr'd voice due to suspected decr'd breath support as a result of PD. Tom told SLP today about some negative feelings surrounding accepting the PD diagnosis. SLP suggested he talk with a professional about these feelings and thoughts. Pt reports feeling tightness in his chest at times during speaking, and a thin voice at times. He also reports s/sx of vocal fatigue.   OBJECTIVE IMPAIRMENTS: Objective impairments include dysarthria and voice disorder. These impairments are limiting patient from household responsibilities, ADLs/IADLs, and effectively communicating at home and in community.Factors affecting potential to achieve goals and functional outcome are none noted.. Patient will benefit from skilled SLP services to address above impairments and improve overall function.  REHAB POTENTIAL: Good  PLAN:  SLP FREQUENCY: 1x/week  SLP DURATION: 6 weeks  PLANNED INTERVENTIONS: Internal/external aids, Functional tasks, SLP instruction and feedback, Compensatory strategies, Patient/family education, [phone] Treatment of speech (30 or 45 min) , and voice exercises   Referring diagnosis?  G20.A1 (ICD-10-CM) - Parkinson's disease without dyskinesia or fluctuating manifestations (HCC)   Treatment diagnosis? (if different than referring diagnosis) Dysarthria and anarthria R47.1  What was this (referring dx) caused by? []  Surgery []  Fall [x]  Ongoing issue []  Arthritis []  Other: ____________  Laterality: []  Rt []  Lt [x]  Both  Check all possible CPT codes:  *CHOOSE 10 OR LESS*    See Planned Interventions listed in the Plan section of the Evaluation.    Xavier Fournier, CCC-SLP 06/10/2024, 12:36 PM

## 2024-06-10 NOTE — Patient Instructions (Addendum)
   SLP printed off this article about med timing  https://www.apdaparkinson.org/article/levodopa -dosing-and-food-intake/

## 2024-06-12 ENCOUNTER — Encounter: Payer: Self-pay | Admitting: Physical Therapy

## 2024-06-12 ENCOUNTER — Encounter

## 2024-06-12 ENCOUNTER — Ambulatory Visit: Admitting: Occupational Therapy

## 2024-06-12 ENCOUNTER — Ambulatory Visit: Admitting: Physical Therapy

## 2024-06-12 DIAGNOSIS — R490 Dysphonia: Secondary | ICD-10-CM | POA: Diagnosis not present

## 2024-06-12 DIAGNOSIS — R29898 Other symptoms and signs involving the musculoskeletal system: Secondary | ICD-10-CM | POA: Diagnosis not present

## 2024-06-12 DIAGNOSIS — M6281 Muscle weakness (generalized): Secondary | ICD-10-CM | POA: Diagnosis not present

## 2024-06-12 DIAGNOSIS — R2681 Unsteadiness on feet: Secondary | ICD-10-CM

## 2024-06-12 DIAGNOSIS — R2689 Other abnormalities of gait and mobility: Secondary | ICD-10-CM | POA: Diagnosis not present

## 2024-06-12 DIAGNOSIS — R29818 Other symptoms and signs involving the nervous system: Secondary | ICD-10-CM | POA: Diagnosis not present

## 2024-06-12 DIAGNOSIS — R278 Other lack of coordination: Secondary | ICD-10-CM | POA: Diagnosis not present

## 2024-06-12 DIAGNOSIS — R41840 Attention and concentration deficit: Secondary | ICD-10-CM | POA: Diagnosis not present

## 2024-06-12 DIAGNOSIS — R471 Dysarthria and anarthria: Secondary | ICD-10-CM | POA: Diagnosis not present

## 2024-06-12 NOTE — Therapy (Signed)
 OUTPATIENT PHYSICAL THERAPY NEURO TREATMENT NOTE   Patient Name: Peter Novak MRN: [id number] DOB:February 24, 1951, 73 y.o., male Today's Date: 06/12/2024   PCP: Katrinka Garnette KIDD, MD  REFERRING PROVIDER: Evonnie Asberry RAMAN, DO      END OF SESSION:  PT End of Session - 06/12/24 1449     Visit Number 15    Number of Visits 21    Date for Recertification  06/28/24   per recert 05/21/2024   Authorization Type [employer]    Authorization Time Period Auth#: [id number] , approved 8 additional visits from 06/06/2024 - 06/28/2024    Authorization - Visit Number 2    Authorization - Number of Visits 8    PT Start Time 1450    PT Stop Time 1531    PT Time Calculation (min) 41 min    Equipment Utilized During Treatment Gait belt    Activity Tolerance Patient tolerated treatment well    Behavior During Therapy WFL for tasks assessed/performed                       Past Medical History:  Diagnosis Date   ADD (attention deficit disorder with hyperactivity)    Allergy    seasonal   Arthritis    Cancer (HCC)    prostate cancer   Cataract    bilateral,removed   Complication of anesthesia    Constipation    GERD (gastroesophageal reflux disease)    GERD (gastroesophageal reflux disease)    Hearing loss    History of skin cancer    Melanoma X 2   Hyperlipidemia    Lyme disease 01/2021   Prostate cancer (HCC) 2016   Past Surgical History:  Procedure Laterality Date   CATARACT EXTRACTION, BILATERAL  March 2022 and April 2022   CHOLECYSTECTOMY  2005   COLONOSCOPY  2008   negative   ELBOW SURGERY Right 1994   ulnar transposition   EYE SURGERY  2021   cataract   HERNIA REPAIR  2005   INNER EAR SURGERY  2006   Incus transposition;oscillculoplasty; Dr Thaddeus GRIST EAR SURGERY  2011   Stirrup resection   LYMPHADENECTOMY Bilateral 11/04/2021   Procedure: LYMPHADENECTOMY, PELVIC;  Surgeon: Renda Glance, MD;  Location: [facility];  Service: Urology;  Laterality:  Bilateral;   POLYPECTOMY     ROBOT ASSISTED LAPAROSCOPIC RADICAL PROSTATECTOMY N/A 11/04/2021   Procedure: XI ROBOTIC ASSISTED LAPAROSCOPIC RADICAL PROSTATECTOMY LEVEL 3;  Surgeon: Renda Glance, MD;  Location: [facility];  Service: Urology;  Laterality: N/A;   SHOULDER ARTHROSCOPY Left 2004   Dr Liam   TONSILLECTOMY AND ADENOIDECTOMY     VASECTOMY     Patient Active Problem List   Diagnosis Date Noted   Parkinson's disease without dyskinesia or fluctuating manifestations (HCC) 04/08/2024   Fatigue 01/16/2024   Excessive daytime sleepiness 01/16/2024   Genetic testing 09/21/2021   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   Bradycardia 11/05/2019   Recurrent episodes Dizziness/Ataxia 11/05/2019   White matter abnormality on MRI of brain 04/16/2019   Migraine with aura and with status migrainosus, not intractable 04/16/2019   Ocular migraine 04/16/2019   Migraine aura without headache (migraine equivalents) 04/16/2019   ADD (attention deficit disorder) 10/02/2014   Insomnia 10/02/2014   Erectile dysfunction 10/02/2014   Osteoarthritis 10/02/2014   Eustachian tube dysfunction 10/02/2014   GERD (gastroesophageal reflux disease) 02/12/2013   Prostate cancer (HCC) 02/12/2013   Benign neoplasm of adrenal gland 11/30/2009   Hyperglycemia 11/30/2009  History of melanoma 11/30/2009   HYPERLIPIDEMIA 07/10/2007    ONSET DATE: April 2025   REFERRING DIAG: G20.A1 (ICD-10-CM) - Parkinson's disease without dyskinesia or fluctuating manifestations (HCC)  THERAPY DIAG:  Unsteadiness on feet  Muscle weakness (generalized)  Other abnormalities of gait and mobility  Rationale for Evaluation and Treatment: Rehabilitation  SUBJECTIVE:                                                                                                                                                                                             SUBJECTIVE STATEMENT: This is my first afternoon appointment, so you  can see how I'm feeling at this time of the day.  Pt does note smaller steps at home later in the day.  Pt accompanied by: self  PERTINENT HISTORY: ADD, prostate CA, HLD, lyme disease  PAIN:  Are you having pain? No  PRECAUTIONS: None  RED FLAGS: None   WEIGHT BEARING RESTRICTIONS: No  FALLS: Has patient fallen in last 6 months? No  LIVING ENVIRONMENT: Lives with: lives with their spouse Lives in: House/apartment Stairs: 1 step to enter; 1 story home Has following equipment at home: Single point cane, Tour manager, and Grab bars  PLOF: Independent, Vocation/Vocational requirements: retired, and Leisure: reading, outdoors, walking a mile after supper  PATIENT GOALS: I don't have any idea  OBJECTIVE:    TODAY'S TREATMENT: 06/12/2024 Activity Comments  NuStep, Level 3 warm up x 2 minutes, then Level 5, 4 extremities Intervals for increased speed to address intensity, rates as 7/10 effort level  Forward/back walking  Forward step over obstacles Sidestepping over obstacles Cues to count steps in fwd direction Cues for increased effort for step clearance/step length  Standing BLE strengthening: Hip abduction x 10 Hip extension x 10 Hip flexion x 10 At weightrack, 10#, cues for eccentric control             PATIENT EDUCATION: Education details: Reviewed plans to follow up about [employer]; strategies to offset smaller steps during pm times Person educated: Patient Education method: Explanation, Demonstration, Tactile cues, Verbal cues, and Handouts Education comprehension: verbalized understanding and returned demonstration    Access Code: [email or web address] URL: [email or web address] Date: 06/03/2024 Prepared by: [facility] - Outpatient  Rehab - [facility]  Program Notes perform balance exercises in a corner for safety ADDED PWR! Moves in standing 10-20 reps daily  Exercises - Single Leg Heel Raise with Unilateral Counter Support  - 1 x daily - 5 x  weekly - 2 sets - 10 reps - Squat with Chair Touch  - 1 x daily - 5 x weekly - 2 sets -  10 reps - Side Stepping with Resistance at Ankles and Counter Support  - 1 x daily - 5 x weekly - 2 sets - 1 min hold - Romberg Stance with Eyes Closed  - 1 x daily - 5 x weekly - 2-3 sets - 30 sec hold - Romberg Stance with Head Rotation  - 1 x daily - 5 x weekly - 2-3 sets - 30 sec hold - Step forward/Backward-Balance Reaction  - 1 x daily - 5 x weekly - 2 sets - 10 reps      Note: Objective measures were completed at Evaluation unless otherwise noted.  DIAGNOSTIC FINDINGS: 12/13/23 brain MRI: T2 FLAIR hyperintense signal changes within the cerebral white matter (moderate) and pons (mild), similar to the prior brain MRI of 05/10/2022. Findings are nonspecific and differential considerations include chronic small vessel ischemic disease, sequelae of a prior infectious/inflammatory process and sequelae of demyelinating disease, among others.   COGNITION: Overall cognitive status: Within functional limits for tasks assessed   SENSATION: Pt denies N/T in UEs/LEs  COORDINATION: Alternating pronation/supination: slightly slowed B Alternating toe tap: hypokinesia L LE Finger to nose: WNL B   MUSCLE TONE: rigidity in L LE, especially in ankle   POSTURE: rounded shoulders, forward head, and weight shift left  LOWER EXTREMITY ROM:     Active  Right Eval Left Eval  Hip flexion    Hip extension    Hip abduction    Hip adduction    Hip internal rotation    Hip external rotation    Knee flexion    Knee extension    Ankle dorsiflexion 10 3  Ankle plantarflexion    Ankle inversion    Ankle eversion     (Blank rows = not tested)  LOWER EXTREMITY MMT:    MMT (in sitting) Right Eval Left Eval  Hip flexion 4+ 4+  Hip extension    Hip abduction 3+ 3+  Hip adduction 4 4  Hip internal rotation    Hip external rotation    Knee flexion 4+ 4  Knee extension 4 4  Ankle dorsiflexion 5 5   Ankle plantarflexion 5 5  Ankle inversion    Ankle eversion    (Blank rows = not tested)  GAIT: Findings: Assistive device utilized:None, Level of assistance: Complete Independence, and Comments: reduced R arm swing  FUNCTIONAL TESTS:  5 times sit to stand: 18.42 sec without UEs 10 meter walk test: 9.21 sec  without AD  (3.56 ft/sec)                                                                                                                              TREATMENT DATE: 04/15/24     PATIENT EDUCATION: Education details: orthostatic hypotension -- monitoring, management and to get medical management Education method: Explanation, Demonstration, Tactile cues, Verbal cues, and Handouts Education comprehension: verbalized understanding  HOME EXERCISE PROGRAM: Not yet initiated   GOALS: Goals reviewed with  patient? Yes  SHORT TERM GOALS: Target date: 05/06/2024  Patient to be independent with initial HEP. Baseline: HEP initiated; reports consistent performance of HEP, 05/13/2024 Goal status: MET    LONG TERM GOALS: Target date: 05/27/2024>>UPDATED TARGET 06/28/2024  Patient to be independent with advanced HEP. Baseline: Needs to be updated, 05/21/24 Goal status: IN PROGRESS  Patient to verbalize understanding of local Parkinson's Disease community resources including community fitness post D/C.   Baseline: pt received and reported understanding  Goal status: MET   Patient to improve MiniBestTest score to atleast 17-21 to decrease risk of falls.  Baseline: 19 04/17/24 >24/28 05/21/2024 Goal status: MET 05/21/24  Patient to demonstrate 50% improvement in R UE swing during gait.  Baseline: decreased UE swing; much improved 06/05/24 Goal status: MET 06/05/24  Patient to demonstrate 5xSTS test in <15 sec in order to decrease risk of falls.   Baseline: 18 sec>17.5 sec; 15.37 sec without UEs 06/05/24 Goal status: IN PROGRESS 06/05/24  Patient to report participation in weekly  exercise class. Baseline: Not yet initiated; looking into [facility] gym 06/05/24 Goal status: IN PROGRESS 06/05/24  TUG/TUG Cognitive score to be < or equal to 10% difference for improved dual task with gait. Baseline: 9.4, 11.25 sec; 24% increase 06/05/24  Goal status:  IN PROGRESS 06/05/24   Push and release test backwards and lateral, to recover in 1-2 steps. Baseline:  3-4 steps; 2 steps required in each direction 06/05/24 Goal status:  MET 06/05/24   ASSESSMENT:  CLINICAL IMPRESSION: Pt presents today and reports occasionally noting smaller steps later in the day, not noting this in PT session today. Skilled PT session focused on aerobic warm up with intervals of increased intensity as well as standing hip strengthening using weight machine, simulating gym exercises and progression.  Also address gait with some strategies to decrease smaller steps in the afternoon times. Pt does well with cues and seems to appreciate the strategies.   Pt will continue to benefit from skilled PT towards goals for improved functional mobility and decreased fall risk.   OBJECTIVE IMPAIRMENTS: Abnormal gait, decreased activity tolerance, decreased balance, decreased coordination, decreased endurance, decreased ROM, decreased strength, impaired tone, and postural dysfunction.   ACTIVITY LIMITATIONS: carrying, lifting, bending, sitting, standing, squatting, stairs, transfers, bed mobility, bathing, dressing, reach over head, and hygiene/grooming  PARTICIPATION LIMITATIONS: meal prep, cleaning, laundry, community activity, yard work, and church  PERSONAL FACTORS: Age, Past/current experiences, Time since onset of injury/illness/exacerbation, and 3+ comorbidities: ADD, prostate CA, HLD, lyme disease are also affecting patient's functional outcome.   REHAB POTENTIAL: Good  CLINICAL DECISION MAKING: Evolving/moderate complexity  EVALUATION COMPLEXITY: Moderate  PLAN:  PT FREQUENCY: 1-2x/week  PT DURATION: 6  weeks per recert 05/21/2024  PLANNED INTERVENTIONS: [id number]- PT Re-evaluation, 97750- Physical Performance Testing, 97110-Therapeutic exercises, 97530- Therapeutic activity, 97112- Neuromuscular re-education, 97535- Self Care, [id number]- Manual therapy, [id number]- Gait training, [phone]- Canalith repositioning, Patient/Family education, Balance training, Stair training, Taping, Vestibular training, Cryotherapy, and Moist heat  PLAN FOR NEXT SESSION: PWR! Moves prone and supine (in preparation for possibly joining [facility] class); EC balance, compliant surface, narrow BOS, and balance with head turns    Greig Anon, PT 06/12/24 5:16 PM Phone: [phone] Fax: [phone]  [facility] at [facility] [address], [address] [place], [place] [address] Phone # [phone] Fax # [phone]

## 2024-06-17 ENCOUNTER — Encounter: Payer: Self-pay | Admitting: Physical Therapy

## 2024-06-17 ENCOUNTER — Ambulatory Visit: Admitting: Physical Therapy

## 2024-06-17 ENCOUNTER — Encounter: Admitting: Occupational Therapy

## 2024-06-17 ENCOUNTER — Ambulatory Visit

## 2024-06-17 DIAGNOSIS — R490 Dysphonia: Secondary | ICD-10-CM | POA: Diagnosis not present

## 2024-06-17 DIAGNOSIS — R2681 Unsteadiness on feet: Secondary | ICD-10-CM | POA: Diagnosis not present

## 2024-06-17 DIAGNOSIS — R2689 Other abnormalities of gait and mobility: Secondary | ICD-10-CM

## 2024-06-17 DIAGNOSIS — R278 Other lack of coordination: Secondary | ICD-10-CM | POA: Diagnosis not present

## 2024-06-17 DIAGNOSIS — R29818 Other symptoms and signs involving the nervous system: Secondary | ICD-10-CM | POA: Diagnosis not present

## 2024-06-17 DIAGNOSIS — R471 Dysarthria and anarthria: Secondary | ICD-10-CM | POA: Diagnosis not present

## 2024-06-17 DIAGNOSIS — M6281 Muscle weakness (generalized): Secondary | ICD-10-CM | POA: Diagnosis not present

## 2024-06-17 DIAGNOSIS — R41840 Attention and concentration deficit: Secondary | ICD-10-CM | POA: Diagnosis not present

## 2024-06-17 DIAGNOSIS — R29898 Other symptoms and signs involving the musculoskeletal system: Secondary | ICD-10-CM | POA: Diagnosis not present

## 2024-06-17 NOTE — Therapy (Signed)
 OUTPATIENT PHYSICAL THERAPY NEURO TREATMENT NOTE   Patient Name: Peter Novak MRN: [id number] DOB:[birth year], 73 y.o., male Today's Date: 06/17/2024   PCP: Katrinka Garnette KIDD, MD  REFERRING PROVIDER: Evonnie Asberry RAMAN, DO      END OF SESSION:  PT End of Session - 06/17/24 1638     Visit Number 16    Number of Visits 21    Date for Recertification  06/28/24   per recert 05/21/2024   Authorization Type [employer]    Authorization Time Period Auth#: [id number] , approved 8 additional visits from 06/06/2024 - 06/28/2024    Authorization - Visit Number 3    Authorization - Number of Visits 8    PT Start Time 1105    PT Stop Time 1145    PT Time Calculation (min) 40 min    Equipment Utilized During Treatment Gait belt    Activity Tolerance Patient tolerated treatment well    Behavior During Therapy WFL for tasks assessed/performed                        Past Medical History:  Diagnosis Date   ADD (attention deficit disorder with hyperactivity)    Allergy    seasonal   Arthritis    Cancer (HCC)    prostate cancer   Cataract    bilateral,removed   Complication of anesthesia    Constipation    GERD (gastroesophageal reflux disease)    GERD (gastroesophageal reflux disease)    Hearing loss    History of skin cancer    Melanoma X 2   Hyperlipidemia    Lyme disease 01/2021   Prostate cancer (HCC) 2016   Past Surgical History:  Procedure Laterality Date   CATARACT EXTRACTION, BILATERAL  March 2022 and April 2022   CHOLECYSTECTOMY  2005   COLONOSCOPY  2008   negative   ELBOW SURGERY Right 1994   ulnar transposition   EYE SURGERY  2021   cataract   HERNIA REPAIR  2005   INNER EAR SURGERY  2006   Incus transposition;oscillculoplasty; Dr Thaddeus GRIST EAR SURGERY  2011   Stirrup resection   LYMPHADENECTOMY Bilateral 11/04/2021   Procedure: LYMPHADENECTOMY, PELVIC;  Surgeon: Renda Glance, MD;  Location: [facility];  Service: Urology;  Laterality:  Bilateral;   POLYPECTOMY     ROBOT ASSISTED LAPAROSCOPIC RADICAL PROSTATECTOMY N/A 11/04/2021   Procedure: XI ROBOTIC ASSISTED LAPAROSCOPIC RADICAL PROSTATECTOMY LEVEL 3;  Surgeon: Renda Glance, MD;  Location: [facility];  Service: Urology;  Laterality: N/A;   SHOULDER ARTHROSCOPY Left 2004   Dr Liam   TONSILLECTOMY AND ADENOIDECTOMY     VASECTOMY     Patient Active Problem List   Diagnosis Date Noted   Parkinson's disease without dyskinesia or fluctuating manifestations (HCC) 04/08/2024   Fatigue 01/16/2024   Excessive daytime sleepiness 01/16/2024   Genetic testing 09/21/2021   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   Bradycardia 11/05/2019   Recurrent episodes Dizziness/Ataxia 11/05/2019   White matter abnormality on MRI of brain 04/16/2019   Migraine with aura and with status migrainosus, not intractable 04/16/2019   Ocular migraine 04/16/2019   Migraine aura without headache (migraine equivalents) 04/16/2019   ADD (attention deficit disorder) 10/02/2014   Insomnia 10/02/2014   Erectile dysfunction 10/02/2014   Osteoarthritis 10/02/2014   Eustachian tube dysfunction 10/02/2014   GERD (gastroesophageal reflux disease) 02/12/2013   Prostate cancer (HCC) 02/12/2013   Benign neoplasm of adrenal gland 11/30/2009   Hyperglycemia  11/30/2009   History of melanoma 11/30/2009   HYPERLIPIDEMIA 07/10/2007    ONSET DATE: April 2025   REFERRING DIAG: G20.A1 (ICD-10-CM) - Parkinson's disease without dyskinesia or fluctuating manifestations (HCC)  THERAPY DIAG:  Unsteadiness on feet  Muscle weakness (generalized)  Other abnormalities of gait and mobility  Rationale for Evaluation and Treatment: Rehabilitation  SUBJECTIVE:                                                                                                                                                                                             SUBJECTIVE STATEMENT: Today is a day where I'm very up and down with  the dizziness.  Yesterday, didn't feel as steady.  Pt accompanied by: self  PERTINENT HISTORY: ADD, prostate CA, HLD, lyme disease  PAIN:  Are you having pain? No  PRECAUTIONS: None  RED FLAGS: None   WEIGHT BEARING RESTRICTIONS: No  FALLS: Has patient fallen in last 6 months? No  LIVING ENVIRONMENT: Lives with: lives with their spouse Lives in: House/apartment Stairs: 1 step to enter; 1 story home Has following equipment at home: Single point cane, Tour manager, and Grab bars  PLOF: Independent, Vocation/Vocational requirements: retired, and Leisure: reading, outdoors, walking a mile after supper  PATIENT GOALS: I don't have any idea  OBJECTIVE:   Feels jittery, reports taking PD meds about 8:30  TODAY'S TREATMENT: 06/17/2024 Activity Comments  BP sitting BP standing 110/69 HR 57 bpm 89/66, HR 64 bpm Feels unsteady, lightheaded  NuStep, Level 4>5, 4 extremities x 6 minutes Cues for intervals SPM > 90     PWR! Moves supine: PWR! Up-shoulders x 10 PWR! Up-hips x 10 PWR! Rock x 10 PWR! Twist x 10 PWR! Step 2 reps each side Visual, verbal, tactile cues  BP sitting 114/67 HR 57 bpm  BP standing 93/66 HR 62 bpm          PATIENT EDUCATION: Education details: Reviewed education about orthostatic BP changes and recommended pt start checking again at home/follow up with MD Person educated: Patient Education method: Explanation, Demonstration, Tactile cues, Verbal cues, and Handouts Education comprehension: verbalized understanding and returned demonstration    Access Code: [email or web address] URL: [email or web address] Date: 06/03/2024 Prepared by: [facility] - Outpatient  Rehab - [facility]  Program Notes perform balance exercises in a corner for safety ADDED PWR! Moves in standing 10-20 reps daily  Exercises - Single Leg Heel Raise with Unilateral Counter Support  - 1 x daily - 5 x weekly - 2 sets - 10 reps - Squat with Chair Touch  - 1 x  daily - 5 x weekly - 2  sets - 10 reps - Side Stepping with Resistance at Ankles and Counter Support  - 1 x daily - 5 x weekly - 2 sets - 1 min hold - Romberg Stance with Eyes Closed  - 1 x daily - 5 x weekly - 2-3 sets - 30 sec hold - Romberg Stance with Head Rotation  - 1 x daily - 5 x weekly - 2-3 sets - 30 sec hold - Step forward/Backward-Balance Reaction  - 1 x daily - 5 x weekly - 2 sets - 10 reps      Note: Objective measures were completed at Evaluation unless otherwise noted.  DIAGNOSTIC FINDINGS: 12/13/23 brain MRI: T2 FLAIR hyperintense signal changes within the cerebral white matter (moderate) and pons (mild), similar to the prior brain MRI of 05/10/2022. Findings are nonspecific and differential considerations include chronic small vessel ischemic disease, sequelae of a prior infectious/inflammatory process and sequelae of demyelinating disease, among others.   COGNITION: Overall cognitive status: Within functional limits for tasks assessed   SENSATION: Pt denies N/T in UEs/LEs  COORDINATION: Alternating pronation/supination: slightly slowed B Alternating toe tap: hypokinesia L LE Finger to nose: WNL B   MUSCLE TONE: rigidity in L LE, especially in ankle   POSTURE: rounded shoulders, forward head, and weight shift left  LOWER EXTREMITY ROM:     Active  Right Eval Left Eval  Hip flexion    Hip extension    Hip abduction    Hip adduction    Hip internal rotation    Hip external rotation    Knee flexion    Knee extension    Ankle dorsiflexion 10 3  Ankle plantarflexion    Ankle inversion    Ankle eversion     (Blank rows = not tested)  LOWER EXTREMITY MMT:    MMT (in sitting) Right Eval Left Eval  Hip flexion 4+ 4+  Hip extension    Hip abduction 3+ 3+  Hip adduction 4 4  Hip internal rotation    Hip external rotation    Knee flexion 4+ 4  Knee extension 4 4  Ankle dorsiflexion 5 5  Ankle plantarflexion 5 5  Ankle inversion    Ankle  eversion    (Blank rows = not tested)  GAIT: Findings: Assistive device utilized:None, Level of assistance: Complete Independence, and Comments: reduced R arm swing  FUNCTIONAL TESTS:  5 times sit to stand: 18.42 sec without UEs 10 meter walk test: 9.21 sec  without AD  (3.56 ft/sec)                                                                                                                              TREATMENT DATE: 04/15/24     PATIENT EDUCATION: Education details: orthostatic hypotension -- monitoring, management and to get medical management Education method: Explanation, Demonstration, Tactile cues, Verbal cues, and Handouts Education comprehension: verbalized understanding  HOME EXERCISE PROGRAM: Not yet initiated   GOALS: Goals  reviewed with patient? Yes  SHORT TERM GOALS: Target date: 05/06/2024  Patient to be independent with initial HEP. Baseline: HEP initiated; reports consistent performance of HEP, 05/13/2024 Goal status: MET    LONG TERM GOALS: Target date: 05/27/2024>>UPDATED TARGET 06/28/2024  Patient to be independent with advanced HEP. Baseline: Needs to be updated, 05/21/24 Goal status: IN PROGRESS  Patient to verbalize understanding of local Parkinson's Disease community resources including community fitness post D/C.   Baseline: pt received and reported understanding  Goal status: MET   Patient to improve MiniBestTest score to atleast 17-21 to decrease risk of falls.  Baseline: 19 04/17/24 >24/28 05/21/2024 Goal status: MET 05/21/24  Patient to demonstrate 50% improvement in R UE swing during gait.  Baseline: decreased UE swing; much improved 06/05/24 Goal status: MET 06/05/24  Patient to demonstrate 5xSTS test in <15 sec in order to decrease risk of falls.   Baseline: 18 sec>17.5 sec; 15.37 sec without UEs 06/05/24 Goal status: IN PROGRESS 06/05/24  Patient to report participation in weekly exercise class. Baseline: Not yet initiated; looking into  [employer] 06/05/24 Goal status: IN PROGRESS 06/05/24  TUG/TUG Cognitive score to be < or equal to 10% difference for improved dual task with gait. Baseline: 9.4, 11.25 sec; 24% increase 06/05/24  Goal status:  IN PROGRESS 06/05/24   Push and release test backwards and lateral, to recover in 1-2 steps. Baseline:  3-4 steps; 2 steps required in each direction 06/05/24 Goal status:  MET 06/05/24   ASSESSMENT:  CLINICAL IMPRESSION: Pt presents today and reports dizziness for the first time in several weeks at PT session.  He reports lightheadedness and BP measures show 20 mm Hg drop from sitting>standing at beginning and end of session.  He endorses continued increased fluid intake and no significant medication timing changes.  Worked on slowed transitions between activities; pt was agreeable to try supine PWR! Moves (in preparation for possible trial of [employer] PWR! Moves class) and he performs well with cues.  He does not have complaints upon leaving session today and is agreeable to checking BP measures at home and following up with MD as needed.   OBJECTIVE IMPAIRMENTS: Abnormal gait, decreased activity tolerance, decreased balance, decreased coordination, decreased endurance, decreased ROM, decreased strength, impaired tone, and postural dysfunction.   ACTIVITY LIMITATIONS: carrying, lifting, bending, sitting, standing, squatting, stairs, transfers, bed mobility, bathing, dressing, reach over head, and hygiene/grooming  PARTICIPATION LIMITATIONS: meal prep, cleaning, laundry, community activity, yard work, and church  PERSONAL FACTORS: Age, Past/current experiences, Time since onset of injury/illness/exacerbation, and 3+ comorbidities: ADD, prostate CA, HLD, lyme disease are also affecting patient's functional outcome.   REHAB POTENTIAL: Good  CLINICAL DECISION MAKING: Evolving/moderate complexity  EVALUATION COMPLEXITY: Moderate  PLAN:  PT FREQUENCY: 1-2x/week  PT DURATION: 6  weeks per recert 05/21/2024  PLANNED INTERVENTIONS: 97164- PT Re-evaluation, 97750- Physical Performance Testing, 97110-Therapeutic exercises, 97530- Therapeutic activity, 97112- Neuromuscular re-education, 97535- Self Care, [id number]- Manual therapy, [id number]- Gait training, [phone]- Canalith repositioning, Patient/Family education, Balance training, Stair training, Taping, Vestibular training, Cryotherapy, and Moist heat  PLAN FOR NEXT SESSION: Ask about dizziness/BP measures; PWR! Moves prone  (in preparation for possibly joining [facility] class), floor to stand transfers; [facility] balance, compliant surface, narrow BOS, and balance with head turns    Greig Anon, PT 06/17/24 4:40 PM Phone: [phone] Fax: [phone]  [facility] at [facility] [address] [place], [place] [address] Phone # [phone] Fax # [phone]

## 2024-06-18 DIAGNOSIS — Z85828 Personal history of other malignant neoplasm of skin: Secondary | ICD-10-CM | POA: Diagnosis not present

## 2024-06-18 DIAGNOSIS — L738 Other specified follicular disorders: Secondary | ICD-10-CM | POA: Diagnosis not present

## 2024-06-18 DIAGNOSIS — L820 Inflamed seborrheic keratosis: Secondary | ICD-10-CM | POA: Diagnosis not present

## 2024-06-18 DIAGNOSIS — L812 Freckles: Secondary | ICD-10-CM | POA: Diagnosis not present

## 2024-06-18 DIAGNOSIS — L821 Other seborrheic keratosis: Secondary | ICD-10-CM | POA: Diagnosis not present

## 2024-06-18 DIAGNOSIS — Z85820 Personal history of malignant melanoma of skin: Secondary | ICD-10-CM | POA: Diagnosis not present

## 2024-06-18 DIAGNOSIS — D485 Neoplasm of uncertain behavior of skin: Secondary | ICD-10-CM | POA: Diagnosis not present

## 2024-06-18 DIAGNOSIS — D225 Melanocytic nevi of trunk: Secondary | ICD-10-CM | POA: Diagnosis not present

## 2024-06-19 ENCOUNTER — Encounter: Admitting: Occupational Therapy

## 2024-06-19 ENCOUNTER — Encounter: Payer: Self-pay | Admitting: Physical Therapy

## 2024-06-19 ENCOUNTER — Encounter

## 2024-06-19 ENCOUNTER — Ambulatory Visit: Admitting: Physical Therapy

## 2024-06-19 DIAGNOSIS — R29818 Other symptoms and signs involving the nervous system: Secondary | ICD-10-CM | POA: Diagnosis not present

## 2024-06-19 DIAGNOSIS — R471 Dysarthria and anarthria: Secondary | ICD-10-CM | POA: Diagnosis not present

## 2024-06-19 DIAGNOSIS — R490 Dysphonia: Secondary | ICD-10-CM | POA: Diagnosis not present

## 2024-06-19 DIAGNOSIS — R41840 Attention and concentration deficit: Secondary | ICD-10-CM | POA: Diagnosis not present

## 2024-06-19 DIAGNOSIS — R2681 Unsteadiness on feet: Secondary | ICD-10-CM | POA: Diagnosis not present

## 2024-06-19 DIAGNOSIS — M6281 Muscle weakness (generalized): Secondary | ICD-10-CM

## 2024-06-19 DIAGNOSIS — R2689 Other abnormalities of gait and mobility: Secondary | ICD-10-CM

## 2024-06-19 DIAGNOSIS — R29898 Other symptoms and signs involving the musculoskeletal system: Secondary | ICD-10-CM | POA: Diagnosis not present

## 2024-06-19 DIAGNOSIS — R278 Other lack of coordination: Secondary | ICD-10-CM | POA: Diagnosis not present

## 2024-06-19 NOTE — Therapy (Signed)
 OUTPATIENT PHYSICAL THERAPY NEURO TREATMENT NOTE   Patient Name: Peter Novak MRN: [id number] DOB:[birth year], 73 y.o., male Today's Date: 06/19/2024   PCP: Katrinka Garnette KIDD, MD  REFERRING PROVIDER: Evonnie Asberry RAMAN, DO      END OF SESSION:  PT End of Session - 06/19/24 0935     Visit Number 17    Number of Visits 21    Date for Recertification  06/28/24   per recert 05/21/2024   Authorization Type [employer]    Authorization Time Period Auth#: [id number] , approved 8 additional visits from 06/06/2024 - 06/28/2024    Authorization - Visit Number 4    Authorization - Number of Visits 8    PT Start Time 0935    PT Stop Time 1015    PT Time Calculation (min) 40 min    Equipment Utilized During Treatment Gait belt    Activity Tolerance Patient tolerated treatment well    Behavior During Therapy WFL for tasks assessed/performed                         Past Medical History:  Diagnosis Date   ADD (attention deficit disorder with hyperactivity)    Allergy    seasonal   Arthritis    Cancer (HCC)    prostate cancer   Cataract    bilateral,removed   Complication of anesthesia    Constipation    GERD (gastroesophageal reflux disease)    GERD (gastroesophageal reflux disease)    Hearing loss    History of skin cancer    Melanoma X 2   Hyperlipidemia    Lyme disease 01/2021   Prostate cancer (HCC) 2016   Past Surgical History:  Procedure Laterality Date   CATARACT EXTRACTION, BILATERAL  March 2022 and April 2022   CHOLECYSTECTOMY  2005   COLONOSCOPY  2008   negative   ELBOW SURGERY Right 1994   ulnar transposition   EYE SURGERY  2021   cataract   HERNIA REPAIR  2005   INNER EAR SURGERY  2006   Incus transposition;oscillculoplasty; Dr Thaddeus GRIST EAR SURGERY  2011   Stirrup resection   LYMPHADENECTOMY Bilateral 11/04/2021   Procedure: LYMPHADENECTOMY, PELVIC;  Surgeon: Renda Glance, MD;  Location: [facility];  Service: Urology;   Laterality: Bilateral;   POLYPECTOMY     ROBOT ASSISTED LAPAROSCOPIC RADICAL PROSTATECTOMY N/A 11/04/2021   Procedure: XI ROBOTIC ASSISTED LAPAROSCOPIC RADICAL PROSTATECTOMY LEVEL 3;  Surgeon: Renda Glance, MD;  Location: [facility];  Service: Urology;  Laterality: N/A;   SHOULDER ARTHROSCOPY Left 2004   Dr Liam   TONSILLECTOMY AND ADENOIDECTOMY     VASECTOMY     Patient Active Problem List   Diagnosis Date Noted   Parkinson's disease without dyskinesia or fluctuating manifestations (HCC) 04/08/2024   Fatigue 01/16/2024   Excessive daytime sleepiness 01/16/2024   Genetic testing 09/21/2021   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   Bradycardia 11/05/2019   Recurrent episodes Dizziness/Ataxia 11/05/2019   White matter abnormality on MRI of brain 04/16/2019   Migraine with aura and with status migrainosus, not intractable 04/16/2019   Ocular migraine 04/16/2019   Migraine aura without headache (migraine equivalents) 04/16/2019   ADD (attention deficit disorder) 10/02/2014   Insomnia 10/02/2014   Erectile dysfunction 10/02/2014   Osteoarthritis 10/02/2014   Eustachian tube dysfunction 10/02/2014   GERD (gastroesophageal reflux disease) 02/12/2013   Prostate cancer (HCC) 02/12/2013   Benign neoplasm of adrenal gland 11/30/2009  Hyperglycemia 11/30/2009   History of melanoma 11/30/2009   HYPERLIPIDEMIA 07/10/2007    ONSET DATE: April 2025   REFERRING DIAG: G20.A1 (ICD-10-CM) - Parkinson's disease without dyskinesia or fluctuating manifestations (HCC)  THERAPY DIAG:  Unsteadiness on feet  Muscle weakness (generalized)  Other abnormalities of gait and mobility  Rationale for Evaluation and Treatment: Rehabilitation  SUBJECTIVE:                                                                                                                                                                                             SUBJECTIVE STATEMENT: Feeling surprisingly well today.   Took the PD medication a little earlier today.  Pt accompanied by: self  PERTINENT HISTORY: ADD, prostate CA, HLD, lyme disease  PAIN:  Are you having pain? No  PRECAUTIONS: None  RED FLAGS: None   WEIGHT BEARING RESTRICTIONS: No  FALLS: Has patient fallen in last 6 months? No  LIVING ENVIRONMENT: Lives with: lives with their spouse Lives in: House/apartment Stairs: 1 step to enter; 1 story home Has following equipment at home: Single point cane, Shower bench, and Grab bars  PLOF: Independent, Vocation/Vocational requirements: retired, and Leisure: reading, outdoors, walking a mile after supper  PATIENT GOALS: I don't have any idea  OBJECTIVE:    TODAY'S TREATMENT: 06/19/2024 Activity Comments  BP sitting BP standing 122/75 HR 59 bpm 104/73 HR 68 bpm  PWR! Moves Prone: PWR! Up-shoulders x 10 PWR! Up-hips x 10 PWR! Rock x 10 PWR! Twist x 10 PWR! Step 2 reps each side   PWR! Moves quadruped PWR! Up PWR! [employer]! Twist PWR! Step More restricted L side with twist and step  Floor to stand Mod I  Forward/back walking with cognitive dual task Naming college teams and mascots, mild LOB and slowing in backward direction        PATIENT EDUCATION: Education details: Provided handouts for information on [employer] PWR! Moves classes and discussed rationale for PWR! Moves positions Person educated: Patient Education method: Explanation, Demonstration, Tactile cues, Verbal cues, and Handouts Education comprehension: verbalized understanding and returned demonstration    Access Code: [email or web address] URL: [email or web address] Date: 06/03/2024 Prepared by: [facility] - Outpatient  Rehab - [facility]  Program Notes perform balance exercises in a corner for safety ADDED PWR! Moves in standing 10-20 reps daily  Exercises - Single Leg Heel Raise with Unilateral Counter Support  - 1 x daily - 5 x weekly - 2 sets - 10 reps - Squat with Chair Touch   - 1 x daily - 5 x weekly - 2 sets - 10 reps - Side Stepping  with Resistance at Ankles and Counter Support  - 1 x daily - 5 x weekly - 2 sets - 1 min hold - Romberg Stance with Eyes Closed  - 1 x daily - 5 x weekly - 2-3 sets - 30 sec hold - Romberg Stance with Head Rotation  - 1 x daily - 5 x weekly - 2-3 sets - 30 sec hold - Step forward/Backward-Balance Reaction  - 1 x daily - 5 x weekly - 2 sets - 10 reps      Note: Objective measures were completed at Evaluation unless otherwise noted.  DIAGNOSTIC FINDINGS: 12/13/23 brain MRI: T2 FLAIR hyperintense signal changes within the cerebral white matter (moderate) and pons (mild), similar to the prior brain MRI of 05/10/2022. Findings are nonspecific and differential considerations include chronic small vessel ischemic disease, sequelae of a prior infectious/inflammatory process and sequelae of demyelinating disease, among others.   COGNITION: Overall cognitive status: Within functional limits for tasks assessed   SENSATION: Pt denies N/T in UEs/LEs  COORDINATION: Alternating pronation/supination: slightly slowed B Alternating toe tap: hypokinesia L LE Finger to nose: WNL B   MUSCLE TONE: rigidity in L LE, especially in ankle   POSTURE: rounded shoulders, forward head, and weight shift left  LOWER EXTREMITY ROM:     Active  Right Eval Left Eval  Hip flexion    Hip extension    Hip abduction    Hip adduction    Hip internal rotation    Hip external rotation    Knee flexion    Knee extension    Ankle dorsiflexion 10 3  Ankle plantarflexion    Ankle inversion    Ankle eversion     (Blank rows = not tested)  LOWER EXTREMITY MMT:    MMT (in sitting) Right Eval Left Eval  Hip flexion 4+ 4+  Hip extension    Hip abduction 3+ 3+  Hip adduction 4 4  Hip internal rotation    Hip external rotation    Knee flexion 4+ 4  Knee extension 4 4  Ankle dorsiflexion 5 5  Ankle plantarflexion 5 5  Ankle inversion    Ankle  eversion    (Blank rows = not tested)  GAIT: Findings: Assistive device utilized:None, Level of assistance: Complete Independence, and Comments: reduced R arm swing  FUNCTIONAL TESTS:  5 times sit to stand: 18.42 sec without UEs 10 meter walk test: 9.21 sec  without AD  (3.56 ft/sec)                                                                                                                              TREATMENT DATE: 04/15/24     PATIENT EDUCATION: Education details: orthostatic hypotension -- monitoring, management and to get medical management Education method: Explanation, Demonstration, Tactile cues, Verbal cues, and Handouts Education comprehension: verbalized understanding  HOME EXERCISE PROGRAM: Not yet initiated   GOALS: Goals reviewed with patient? Yes  SHORT TERM  GOALS: Target date: 05/06/2024  Patient to be independent with initial HEP. Baseline: HEP initiated; reports consistent performance of HEP, 05/13/2024 Goal status: MET    LONG TERM GOALS: Target date: 05/27/2024>>UPDATED TARGET 06/28/2024  Patient to be independent with advanced HEP. Baseline: Needs to be updated, 05/21/24 Goal status: IN PROGRESS  Patient to verbalize understanding of local Parkinson's Disease community resources including community fitness post D/C.   Baseline: pt received and reported understanding  Goal status: MET   Patient to improve MiniBestTest score to atleast 17-21 to decrease risk of falls.  Baseline: 19 04/17/24 >24/28 05/21/2024 Goal status: MET 05/21/24  Patient to demonstrate 50% improvement in R UE swing during gait.  Baseline: decreased UE swing; much improved 06/05/24 Goal status: MET 06/05/24  Patient to demonstrate 5xSTS test in <15 sec in order to decrease risk of falls.   Baseline: 18 sec>17.5 sec; 15.37 sec without UEs 06/05/24 Goal status: IN PROGRESS 06/05/24  Patient to report participation in weekly exercise class. Baseline: Not yet initiated; looking into  [employer] 06/05/24 Goal status: IN PROGRESS 06/05/24  TUG/TUG Cognitive score to be < or equal to 10% difference for improved dual task with gait. Baseline: 9.4, 11.25 sec; 24% increase 06/05/24  Goal status:  IN PROGRESS 06/05/24   Push and release test backwards and lateral, to recover in 1-2 steps. Baseline:  3-4 steps; 2 steps required in each direction 06/05/24 Goal status:  MET 06/05/24   ASSESSMENT:  CLINICAL IMPRESSION: Pt presents today with no reports of dizziness today; his BP measures still drop almost 20 points from sitting to standing, but are [facility] and he does not have lightheadedness.  Proceeded with trial of PWR! Moves prone and quadruped, with pt demo good understanding of these positions.  He is tighter and not as flexible on L side. He is progressing towards goals and anticipate he is ready for discharge next visit. OBJECTIVE IMPAIRMENTS: Abnormal gait, decreased activity tolerance, decreased balance, decreased coordination, decreased endurance, decreased ROM, decreased strength, impaired tone, and postural dysfunction.   ACTIVITY LIMITATIONS: carrying, lifting, bending, sitting, standing, squatting, stairs, transfers, bed mobility, bathing, dressing, reach over head, and hygiene/grooming  PARTICIPATION LIMITATIONS: meal prep, cleaning, laundry, community activity, yard work, and church  PERSONAL FACTORS: Age, Past/current experiences, Time since onset of injury/illness/exacerbation, and 3+ comorbidities: ADD, prostate CA, HLD, lyme disease are also affecting patient's functional outcome.   REHAB POTENTIAL: Good  CLINICAL DECISION MAKING: Evolving/moderate complexity  EVALUATION COMPLEXITY: Moderate  PLAN:  PT FREQUENCY: 1-2x/week  PT DURATION: 6 weeks per recert 05/21/2024  PLANNED INTERVENTIONS: 97164- PT Re-evaluation, 97750- Physical Performance Testing, 97110-Therapeutic exercises, 97530- Therapeutic activity, 97112- Neuromuscular re-education, 97535- Self Care,  97140- Manual therapy, [id number]- Gait training, 04007- Canalith repositioning, Patient/Family education, Balance training, Stair training, Taping, Vestibular training, Cryotherapy, and Moist heat  PLAN FOR NEXT SESSION: Check LTGs and discharge.  Greig Anon, PT 06/19/24 10:19 AM Phone: [phone] Fax: [phone]  [facility] at [facility] [address] [place], [address] [place], [place] [address] Phone # [phone] Fax # [phone]

## 2024-06-24 ENCOUNTER — Encounter: Payer: Self-pay | Admitting: Physical Therapy

## 2024-06-24 ENCOUNTER — Ambulatory Visit: Admitting: Physical Therapy

## 2024-06-24 ENCOUNTER — Ambulatory Visit: Admitting: Occupational Therapy

## 2024-06-24 ENCOUNTER — Encounter

## 2024-06-24 DIAGNOSIS — R2689 Other abnormalities of gait and mobility: Secondary | ICD-10-CM | POA: Diagnosis not present

## 2024-06-24 DIAGNOSIS — R29898 Other symptoms and signs involving the musculoskeletal system: Secondary | ICD-10-CM | POA: Diagnosis not present

## 2024-06-24 DIAGNOSIS — R278 Other lack of coordination: Secondary | ICD-10-CM | POA: Diagnosis not present

## 2024-06-24 DIAGNOSIS — R29818 Other symptoms and signs involving the nervous system: Secondary | ICD-10-CM

## 2024-06-24 DIAGNOSIS — R471 Dysarthria and anarthria: Secondary | ICD-10-CM | POA: Diagnosis not present

## 2024-06-24 DIAGNOSIS — R2681 Unsteadiness on feet: Secondary | ICD-10-CM

## 2024-06-24 DIAGNOSIS — R490 Dysphonia: Secondary | ICD-10-CM | POA: Diagnosis not present

## 2024-06-24 DIAGNOSIS — R41840 Attention and concentration deficit: Secondary | ICD-10-CM | POA: Diagnosis not present

## 2024-06-24 DIAGNOSIS — M6281 Muscle weakness (generalized): Secondary | ICD-10-CM | POA: Diagnosis not present

## 2024-06-24 NOTE — Therapy (Addendum)
 OUTPATIENT OCCUPATIONAL THERAPY [facility]  Treatment Note & Discharge Note  Patient Name: Peter Novak MRN: [id number] DOB:09/24/1950, 73 y.o., male Today's Date: 06/24/2024  PCP: Katrinka Garnette KIDD, MD REFERRING PROVIDER: Tat, Asberry RAMAN, DO  OCCUPATIONAL THERAPY DISCHARGE SUMMARY  Visits from Start of Care: 8  Current functional level related to goals / functional outcomes: Pt has made progress in many areas, meeting 3 of 4 LTGs and partially meeting 4th (meeting with RUE, but not LUE).  Pt is utilizing strategies to increase ease and independence with ADLs and IADLs.   Remaining deficits: LUE fine motor control, reporting some ongoing difficulties with aspects of dressing   Education / Equipment: Community PD resources and classes, large amplitude and coordination HEP, education on strategies for UB dressing and donning jacket   Patient agrees to discharge. Patient goals were partially met. Patient is being discharged due to being pleased with the current functional level.  Pt in agreement to return in 6-8 months for PD screen and understands recommendation to reach out to MD if concerns arise prior to screen date.     END OF SESSION:    06/24/24 1127  OT Visits / Re-Eval  Visit Number 8  Number of Visits 8  Date for Recertification  06/24/24  Authorization  Authorization Type [employer]  Authorization Time Period 8 visits 04/25/2024 - 07/24/2024  Authorization - Visit Number 8  Authorization - Number of Visits 8  OT Time Calculation  OT Start Time 1108  OT Stop Time 1147  OT Time Calculation (min) 39 min  End of Session  Activity Tolerance Patient tolerated treatment well  Behavior During Therapy WFL for tasks assessed/performed      Past Medical History:  Diagnosis Date   ADD (attention deficit disorder with hyperactivity)    Allergy    seasonal   Arthritis    Cancer (HCC)    prostate cancer   Cataract    bilateral,removed   Complication of  anesthesia    Constipation    GERD (gastroesophageal reflux disease)    GERD (gastroesophageal reflux disease)    Hearing loss    History of skin cancer    Melanoma X 2   Hyperlipidemia    Lyme disease 01/2021   Prostate cancer (HCC) 2016   Past Surgical History:  Procedure Laterality Date   CATARACT EXTRACTION, BILATERAL  March 2022 and April 2022   CHOLECYSTECTOMY  2005   COLONOSCOPY  2008   negative   ELBOW SURGERY Right 1994   ulnar transposition   EYE SURGERY  2021   cataract   HERNIA REPAIR  2005   INNER EAR SURGERY  2006   Incus transposition;oscillculoplasty; Dr Thaddeus GRIST EAR SURGERY  2011   Stirrup resection   LYMPHADENECTOMY Bilateral 11/04/2021   Procedure: LYMPHADENECTOMY, PELVIC;  Surgeon: Renda Glance, MD;  Location: [facility];  Service: Urology;  Laterality: Bilateral;   POLYPECTOMY     ROBOT ASSISTED LAPAROSCOPIC RADICAL PROSTATECTOMY N/A 11/04/2021   Procedure: XI ROBOTIC ASSISTED LAPAROSCOPIC RADICAL PROSTATECTOMY LEVEL 3;  Surgeon: Renda Glance, MD;  Location: [facility];  Service: Urology;  Laterality: N/A;   SHOULDER ARTHROSCOPY Left 2004   Dr Liam   TONSILLECTOMY AND ADENOIDECTOMY     VASECTOMY     Patient Active Problem List   Diagnosis Date Noted   Parkinson's disease without dyskinesia or fluctuating manifestations (HCC) 04/08/2024   Fatigue 01/16/2024   Excessive daytime sleepiness 01/16/2024   Genetic testing 09/21/2021   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   Bradycardia 11/05/2019   Recurrent episodes Dizziness/Ataxia 11/05/2019   White matter abnormality on MRI of brain 04/16/2019   Migraine with aura and with status migrainosus, not intractable 04/16/2019   Ocular migraine 04/16/2019   Migraine aura without headache (migraine equivalents) 04/16/2019   ADD (attention deficit disorder) 10/02/2014   Insomnia 10/02/2014   Erectile dysfunction 10/02/2014   Osteoarthritis 10/02/2014   Eustachian tube dysfunction 10/02/2014   GERD  (gastroesophageal reflux disease) 02/12/2013   Prostate cancer (HCC) 02/12/2013   Benign neoplasm of adrenal gland 11/30/2009   Hyperglycemia 11/30/2009   History of melanoma 11/30/2009   HYPERLIPIDEMIA 07/10/2007    ONSET DATE: referral date 04/16/24  REFERRING DIAG: R68.89 (ICD-10-CM) - Difficulty writing  THERAPY DIAG:  Other lack of coordination  Other symptoms and signs involving the nervous system  Rationale for Evaluation and Treatment: Rehabilitation  SUBJECTIVE:   SUBJECTIVE STATEMENT: Pt reports that he has been doing his walking and his hand exercises.  Today is a slow day.  Pt accompanied by: self  PERTINENT HISTORY: ADD, prostate CA, HLD, lyme disease   PRECAUTIONS: None  WEIGHT BEARING RESTRICTIONS: No  PAIN:  Are you having pain? No  FALLS: Has patient fallen in last 6 months? No  LIVING ENVIRONMENT: Lives with: lives with their spouse Lives in: House/apartment Stairs: 1 step to enter; 1 story home Has following equipment at home: Tour manager, and Grab bars  PLOF: Independent, Vocation/Vocational requirements: retired, and Leisure: reading, outdoors, walking a mile after supper  PATIENT GOALS: see if I can get better/extend working time on [employer]  OBJECTIVE:  Note: Objective measures were completed at Evaluation unless otherwise noted.  HAND DOMINANCE: Right  ADLs: Overall ADLs: it takes longer Transfers/ambulation related to ADLs: no AD Eating: difficulty with opening small cap on square milk cartons Grooming: Mod I, increased time when shaving UB Dressing: buttons can be difficult LB Dressing: occasional difficulty getting pants Toileting: Mod I Bathing: Mod I Tub Shower transfers: Mod I Equipment: Walk in shower and built in bench in shower  IADLs: Light housekeeping: Mod I, still cutting grass Meal Prep: Mod I Community mobility: Mod I Medication management: Mod I Financial management: Mod I Handwriting: 100% legible, Mild  micrographia, and 12.60 sec (whales live in a blue ocean.)  MOBILITY STATUS: Independent  POSTURE COMMENTS:  rounded shoulders, forward head, and weight shift left  ACTIVITY TOLERANCE: Activity tolerance: diminished  FUNCTIONAL OUTCOME MEASURES: Fastening/unfastening 3 buttons: 23.53 sec Physical performance test: PPT#2 (simulated eating) 11.56 & PPT#4 (donning/doffing jacket): TBD  COORDINATION: 9 Hole Peg test: Right: 29.25 sec; Left: 28.69 sec Box and Blocks:  Right 49 blocks, Left 49 blocks Alternating pronation/supination: slightly slowed B Alternating toe tap: hypokinesia L LE Finger to nose: WNL B  UE ROM:  WFL  UE MMT:   WFL Grip strength: Right: 50 and 54# and Left: 60 and 65#  SENSATION: Pt denies N/T in UEs/LEs  COGNITION: Overall cognitive status: Within functional limits for tasks assessed  OBSERVATIONS: Bradykinesia  TREATMENT DATE:  06/24/24 AE: pt reports purchasing jar/bottle opener which has alleviated some of the frustration with opening items.   9 hole peg test: L: 31.94, 30.75, 32.60 sec (average 31.76 sec).  Pt reporting that he is feeling slower today, but also reflects that he did a lot of hand exercises yesterday and was feeling sore afterwards.  OT reiterating education on importance of large amplitude exercises while also not overdoing due to reports of arthritis in hands.  OT educating on paying attention to quality over quantity, especially when recognizing having an off day.   Box and blocks: L:54, R: 55 blocks.  Pt demonstrating improvements in [facility] this session. ADL: Simulated ADLs for the following activities (using a plastic bag/pillow case): donning shirt, drying back, pulling down shirt.  OT educating on purposeful, intentional movements during gross and fine motor control tasks.  OT also educating on technique with donning  shirt to ensure advancing sleeves over elbow and advancing jacket around shoulders when donning jacket.      06/05/24 -Reassessed goals, see goals for detailed measurements. Discussed with pt noted improvements and noted declines. Pt at this time requesting to be placed on hold to allow for practicing of educated techniques and HEPs on his own and to return to be re-assessed.   -educated pt in obtaining AE for opening bottles and caps, showed adaptive bottle opener on [employer], pt took picture for reference. Pt expressed interest in obtaining.  06/03/24 Handwriting/typing: engaged in discussion about impact of arthritis and PD on quality of movement, especially with typing. Educating on setup of work space to ensure improved alignment and decreased pressure on joints. Pt reports that he feels stiff and has decreased control after ~10 mins of typing. Tendon gliding: OT educating on tendon gliding with hook fist, full fist, flat fist, and table top for motor control to aid in typing. Coordination: engaged in picking up coins one at a time, translating into palm of hand to hold 5-8 coins and then translating back to finger tips to place into coin slot.  OT educating on carryover of intrinsic hand movements to quality of movement with typing, handwriting, and other fine motor tasks.   9 hole peg test: R: 29.31, 25.31 sec, 24.25 sec (average: 25.29 sec); L: 27.66, 33.0, 26.84 (average: 29.17 seconds) Self-care: educated on typical therapy program with PD with recommendation for screen after completion of OT appts.  OT encouraging pt to establish an exercise routine at home and to attend PD programming for carryover of exercises outside of therapy appts.  Pt still with multiple questions about his diagnosis as well as concerns with handwriting/typing.  Pt in agreement with plan to assess goals at next session with potential plan to take a 3-4 week hold on therapy to focus on establishing a home routine and then  to return to assess if maintaining progress with fine motor tasks and quality of movement.   05/23/24 Large amplitude hands: engaged in finger flicks with full hand opening and thumb opposition with finger extension. OT instructing in forearm supination/pronation as well as wrist flexion/extension with large amplitude as well.  OT providing initial cue, pt then completing with good amplitude. Buttons: 23.75 sec and 18.91 sec with pt demonstrating increased attention to hand placement and purposeful movements during 2nd attempt. Coordination: completed bilaterally with focus on big, intentional movements.   Pick up coins and stack one at a time Pick up coins and rotate 360* Pick up 5-10 coins one at a time  and hold in palm. Then, move coins from palm to fingertips one at time and place in coin bank/container. 9 hole peg test: R: 34.47 sec and L: 34.16 sec     PATIENT EDUCATION: Education details: Progress with goals, AE for opening bottles and caps Person educated: Patient Education method: Explanation, Verbal cues, and Handouts Education comprehension: verbalized understanding and needs further education  HOME EXERCISE PROGRAM: Access Code: [facility] URL: [email or web address] Date: 06/03/2024 Prepared by: [facility] - Outpatient  Rehab - [facility]  Exercises - Seated Finger Flicks with Elbow Extension  - 7 x weekly - 1 sets - 10 reps - Seated Reach Forward, Up, and To Sides  - 7 x weekly - 1 sets - 10 reps - Open Book Chest Stretch on Towel Roll  - 7 x weekly - 1 sets - 10 reps - Shoulder Single Arm PNF D2 Flexion with Resistance  - 2 x daily - 3 x weekly - 2 sets - 10 reps - Seated Shoulder Flexion with Self-Anchored Resistance  - 2 x daily - 3 x weekly - 2 sets - 10 reps - Single Arm Shoulder Extension with Anchored Resistance  - 2 x daily - 3 x weekly - 2 sets - 10 reps - Seated Digit Tendon Gliding  - 2 x daily - 10 reps  GOALS: Goals reviewed with patient?  Yes  SHORT TERM GOALS: Target date: 05/17/24  Pt will be Independent in PD specific HEP. Baseline: new PD diagnosis Goal status: GOAL MET  2.  Pt will verbalize understanding of adapted strategies to maximize safety and Independence with ADLs/ IADLs . Baseline: difficulty with buttons, putting on pants, opening caps and bottles, wiping down shower Goal status: GOAL MET  3.  Pt will verbalize and demonstrate improved quality of handwriting by writing a short paragraph with 100% legibility and no significant decrease in letter size. Baseline: mild micrographia, 100% legible 06/24/24 - not assessed due to stiffness in hands and decreased timing on 9 hole peg test this session Goal status: in progress  4.  Pt will verbalize increased tolerance to typing and use of adaptive strategies PRN to increase success with typing longer passages. Baseline: hand fatigue after ~5 mins of typing 06/05/24: Pt still reports fatigue after ~5 minutes of typing Goal status: in progress   LONG TERM GOALS: Target date: 06/07/24  Pt will demonstrate improved fine motor coordination for ADLs as evidenced by decreasing 9 hole peg test score for BUE by 3 secs Baseline: Right: 29.25 sec; Left: 28.69 sec 05/23/24: R: 34.47 sec and L: 34.16 sec 06/03/24: R: 29.31, 25.31 sec, 24.25 sec (average: 25.29 sec);; L: 27.66, 33.0, 26.84 (average 29.17 sec) 06/05/24: L: 30.66, 27.97, 29.10 (average: 29.24 sec) 06/24/24: L: 31.94, 30.75, 32.60 sec (average 31.76 sec) Goal status: MET on R 06/03/24; Not met on Left  2.  Pt will demonstrate improved UE functional use for ADLs as evidenced by increasing box/ blocks score by 5 blocks with RUE/LUE Baseline: R: 49 and L: 49 blocks 06/05/24: R: 50 blocks and L: 47 blocks 06/24/24: L: 54 blocks and R: 55 blocks Goal status: MET  3.  Pt will demonstrate improved ease with fastening buttons as evidenced by decreasing 3 button/ unbutton time to 20 secs or less Baseline: 23.53  sec 05/24/24: 23.75 sec and 18.91 sec 06/05/24: 29.50 sec, 19.42 seconds second trial Goal status: GOAL MET  4.  Pt will verbalize understanding of ways to prevent future PD related complications and PD community  resources. Baseline: new PD diagnosis Goal status: GOAL MET; pt verbalizes understanding of community resources.    ASSESSMENT:  CLINICAL IMPRESSION: Patient is a 73 y.o. male who was seen today for occupational therapy treatment for impairments in dexterity with handwriting/typing, opening jars/bottles, and ADLs due to PD.  Pt benefiting from reiterating education on intentional, purposeful movements during fine and gross motor tasks. Pt pleasant and participatory throughout tasks. Discussed with pt improvements and declines noted, recognizing importance of taking a break and continuing to engage in community PD resources and implementing HEP from therapy sessions to aid in ADLs. Pt pleased with progress and current status and agreeable to return in 6-8 months for PD screening.   PERFORMANCE DEFICITS: in functional skills including ADLs, IADLs, coordination, strength, Fine motor control, Gross motor control, body mechanics, endurance, decreased knowledge of precautions, and UE functional use and psychosocial skills including coping strategies and routines and behaviors.   IMPAIRMENTS: are limiting patient from ADLs, IADLs, and leisure.    PLAN:  OT FREQUENCY: one time visit  OT DURATION: other: one time visit  PLANNED INTERVENTIONS: 97168 OT Re-evaluation, 97535 self care/ADL training, 02889 therapeutic exercise, 97530 therapeutic activity, 97112 neuromuscular re-education, 97750 Physical Performance Testing, functional mobility training, psychosocial skills training, energy conservation, coping strategies training, patient/family education, and DME and/or AE instructions  RECOMMENDED OTHER SERVICES: SLP eval  CONSULTED AND AGREED WITH PLAN OF CARE: Patient    KAYLENE DOMINO,  OTR/L 06/24/2024, 12:19 PM   [facility] at [facility] [address] [place], [place] [address] Phone # [phone] Fax # [phone]

## 2024-06-24 NOTE — Addendum Note (Signed)
 Addended by: Kaziah Krizek M on: 06/24/2024 12:27 PM   Modules accepted: Orders

## 2024-06-24 NOTE — Therapy (Signed)
 OUTPATIENT PHYSICAL THERAPY NEURO TREATMENT NOTE/DISCHARGE SUMMARY   Patient Name: Peter Novak MRN: [id number] DOB:1951/01/14, 73 y.o., male Today's Date: 06/24/2024   PCP: Katrinka Garnette KIDD, MD  REFERRING PROVIDER: Tat, Asberry RAMAN, DO  PHYSICAL THERAPY DISCHARGE SUMMARY  Visits from Start of Care: 18  Current functional level related to goals / functional outcomes: Pt has met 6 of 7 LTGs-see below   Remaining deficits: Balance, bradykinesia (improving)   Education / Equipment: Educated in HEP, optimal exercise for PD   Patient agrees to discharge. Patient goals were met. Patient is being discharged due to meeting the stated rehab goals.     END OF SESSION:  PT End of Session - 06/24/24 0937     Visit Number 18    Number of Visits 21    Date for Recertification  06/28/24   per recert 05/21/2024   Authorization Type [employer]    Authorization Time Period Auth#: [id number] , approved 8 additional visits from 06/06/2024 - 06/28/2024    Authorization - Visit Number 5    Authorization - Number of Visits 8    PT Start Time 0936    PT Stop Time 1014    PT Time Calculation (min) 38 min    Equipment Utilized During Treatment --    Activity Tolerance Patient tolerated treatment well    Behavior During Therapy WFL for tasks assessed/performed                          Past Medical History:  Diagnosis Date   ADD (attention deficit disorder with hyperactivity)    Allergy    seasonal   Arthritis    Cancer (HCC)    prostate cancer   Cataract    bilateral,removed   Complication of anesthesia    Constipation    GERD (gastroesophageal reflux disease)    GERD (gastroesophageal reflux disease)    Hearing loss    History of skin cancer    Melanoma X 2   Hyperlipidemia    Lyme disease 01/2021   Prostate cancer (HCC) 2016   Past Surgical History:  Procedure Laterality Date   CATARACT EXTRACTION, BILATERAL  March 2022 and April 2022    CHOLECYSTECTOMY  2005   COLONOSCOPY  2008   negative   ELBOW SURGERY Right 1994   ulnar transposition   EYE SURGERY  2021   cataract   HERNIA REPAIR  2005   INNER EAR SURGERY  2006   Incus transposition;oscillculoplasty; Dr Thaddeus GRIST EAR SURGERY  2011   Stirrup resection   LYMPHADENECTOMY Bilateral 11/04/2021   Procedure: LYMPHADENECTOMY, PELVIC;  Surgeon: Renda Glance, MD;  Location: [facility];  Service: Urology;  Laterality: Bilateral;   POLYPECTOMY     ROBOT ASSISTED LAPAROSCOPIC RADICAL PROSTATECTOMY N/A 11/04/2021   Procedure: XI ROBOTIC ASSISTED LAPAROSCOPIC RADICAL PROSTATECTOMY LEVEL 3;  Surgeon: Renda Glance, MD;  Location: [facility];  Service: Urology;  Laterality: N/A;   SHOULDER ARTHROSCOPY Left 2004   Dr Liam   TONSILLECTOMY AND ADENOIDECTOMY     VASECTOMY     Patient Active Problem List   Diagnosis Date Noted   Parkinson's disease without dyskinesia or fluctuating manifestations (HCC) 04/08/2024   Fatigue 01/16/2024   Excessive daytime sleepiness 01/16/2024   Genetic testing 09/21/2021   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   Bradycardia 11/05/2019   Recurrent episodes Dizziness/Ataxia 11/05/2019   White matter abnormality on MRI of brain 04/16/2019   Migraine with aura  and with status migrainosus, not intractable 04/16/2019   Ocular migraine 04/16/2019   Migraine aura without headache (migraine equivalents) 04/16/2019   ADD (attention deficit disorder) 10/02/2014   Insomnia 10/02/2014   Erectile dysfunction 10/02/2014   Osteoarthritis 10/02/2014   Eustachian tube dysfunction 10/02/2014   GERD (gastroesophageal reflux disease) 02/12/2013   Prostate cancer (HCC) 02/12/2013   Benign neoplasm of adrenal gland 11/30/2009   Hyperglycemia 11/30/2009   History of melanoma 11/30/2009   HYPERLIPIDEMIA 07/10/2007    ONSET DATE: April 2025   REFERRING DIAG: G20.A1 (ICD-10-CM) - Parkinson's disease without dyskinesia or fluctuating manifestations  (HCC)  THERAPY DIAG:  Unsteadiness on feet  Other abnormalities of gait and mobility  Rationale for Evaluation and Treatment: Rehabilitation  SUBJECTIVE:                                                                                                                                                                                             SUBJECTIVE STATEMENT: Feel that I have a better attitude about dealing with my Parkinson's.  Pt accompanied by: self  PERTINENT HISTORY: ADD, prostate CA, HLD, lyme disease  PAIN:  Are you having pain? No  PRECAUTIONS: None  RED FLAGS: None   WEIGHT BEARING RESTRICTIONS: No  FALLS: Has patient fallen in last 6 months? No  LIVING ENVIRONMENT: Lives with: lives with their spouse Lives in: House/apartment Stairs: 1 step to enter; 1 story home Has following equipment at home: Single point cane, Shower bench, and Grab bars  PLOF: Independent, Vocation/Vocational requirements: retired, and Leisure: reading, outdoors, walking a mile after supper  PATIENT GOALS: I don't have any idea  OBJECTIVE:    TODAY'S TREATMENT: 06/24/2024 Activity Comments  TUG 10.47 sec TUG cognitive:  11.06 sec   FTSTS:  15.75 sec   Gait velocity:  7.87 sec (4.17 ft/sec)   Review HEP and answered questions             PATIENT EDUCATION: Education details: Progress towards goals, POC and plans for discharge Person educated: Patient Education method: Explanation, Demonstration, Tactile cues, Verbal cues, and Handouts Education comprehension: verbalized understanding and returned demonstration    Access Code: [email or web address] URL: [email or web address] Date: 06/03/2024 Prepared by: [facility] - Outpatient  Rehab - [facility]  Program Notes perform balance exercises in a corner for safety ADDED [employer]! Moves in standing 10-20 reps daily  Exercises - Single Leg Heel Raise with Unilateral Counter Support  - 1 x daily - 5 x weekly - 2 sets  - 10 reps - Squat with Chair Touch  - 1 x daily - 5 x weekly - 2 sets -  10 reps - Side Stepping with Resistance at Ankles and Counter Support  - 1 x daily - 5 x weekly - 2 sets - 1 min hold - Romberg Stance with Eyes Closed  - 1 x daily - 5 x weekly - 2-3 sets - 30 sec hold - Romberg Stance with Head Rotation  - 1 x daily - 5 x weekly - 2-3 sets - 30 sec hold - Step forward/Backward-Balance Reaction  - 1 x daily - 5 x weekly - 2 sets - 10 reps      Note: Objective measures were completed at Evaluation unless otherwise noted.  DIAGNOSTIC FINDINGS: 12/13/23 brain MRI: T2 FLAIR hyperintense signal changes within the cerebral white matter (moderate) and pons (mild), similar to the prior brain MRI of 05/10/2022. Findings are nonspecific and differential considerations include chronic small vessel ischemic disease, sequelae of a prior infectious/inflammatory process and sequelae of demyelinating disease, among others.   COGNITION: Overall cognitive status: Within functional limits for tasks assessed   SENSATION: Pt denies N/T in UEs/LEs  COORDINATION: Alternating pronation/supination: slightly slowed B Alternating toe tap: hypokinesia L LE Finger to nose: WNL B   MUSCLE TONE: rigidity in L LE, especially in ankle   POSTURE: rounded shoulders, forward head, and weight shift left  LOWER EXTREMITY ROM:     Active  Right Eval Left Eval  Hip flexion    Hip extension    Hip abduction    Hip adduction    Hip internal rotation    Hip external rotation    Knee flexion    Knee extension    Ankle dorsiflexion 10 3  Ankle plantarflexion    Ankle inversion    Ankle eversion     (Blank rows = not tested)  LOWER EXTREMITY MMT:    MMT (in sitting) Right Eval Left Eval  Hip flexion 4+ 4+  Hip extension    Hip abduction 3+ 3+  Hip adduction 4 4  Hip internal rotation    Hip external rotation    Knee flexion 4+ 4  Knee extension 4 4  Ankle dorsiflexion 5 5  Ankle  plantarflexion 5 5  Ankle inversion    Ankle eversion    (Blank rows = not tested)  GAIT: Findings: Assistive device utilized:None, Level of assistance: Complete Independence, and Comments: reduced R arm swing  FUNCTIONAL TESTS:  5 times sit to stand: 18.42 sec without UEs 10 meter walk test: 9.21 sec  without AD  (3.56 ft/sec)                                                                                                                              TREATMENT DATE: 04/15/24     PATIENT EDUCATION: Education details: orthostatic hypotension -- monitoring, management and to get medical management Education method: Explanation, Demonstration, Tactile cues, Verbal cues, and Handouts Education comprehension: verbalized understanding  HOME EXERCISE PROGRAM: Not yet initiated   GOALS: Goals reviewed with  patient? Yes  SHORT TERM GOALS: Target date: 05/06/2024  Patient to be independent with initial HEP. Baseline: HEP initiated; reports consistent performance of HEP, 05/13/2024 Goal status: MET    LONG TERM GOALS: Target date: 05/27/2024>>UPDATED TARGET 06/28/2024  Patient to be independent with advanced HEP. Baseline:  Goal status: MET  Patient to verbalize understanding of local Parkinson's Disease community resources including community fitness post D/C.   Baseline: pt received and reported understanding  Goal status: MET   Patient to improve MiniBestTest score to atleast 17-21 to decrease risk of falls.  Baseline: 19 04/17/24 >24/28 05/21/2024 Goal status: MET 05/21/24  Patient to demonstrate 50% improvement in R UE swing during gait.  Baseline: decreased UE swing; much improved 06/05/24 Goal status: MET 06/05/24  Patient to demonstrate 5xSTS test in <15 sec in order to decrease risk of falls.   Baseline: 18 sec>17.5 sec; 15.37 sec without UEs 06/05/24; 15.75 sec 06/24/2024 Goal status: PARTIALLY MET, 06/24/24  Patient to report participation in weekly exercise  class. Baseline:looking into [employer] gym 06/05/24; joining [employer] 06/24/2024 Goal status: MET 06/24/2024  TUG/TUG Cognitive score to be < or equal to 10% difference for improved dual task with gait. Baseline: 9.4, 11.25 sec; 24% increase 06/05/24 ; 10.47 sec/11.06 sec 06/24/2024 Goal status:  MET, 06/24/2024  Push and release test backwards and lateral, to recover in 1-2 steps. Baseline:  3-4 steps; 2 steps required in each direction 06/05/24 Goal status:  MET 06/05/24   ASSESSMENT:  CLINICAL IMPRESSION: Pt presents today with reports that he feels overall, therapy has helped him move better and be more aware of movement patterns.  Skilled PT session focused on assessing goals, with pt improving FTSTS, TUG/TUG cognitive, and gait velocity measures since eval.  Pt has met 6 of 7 LTGs.  Reviewed education about continued exercise and follow up screen in 6 months as current PT plan.  Pt is planning to join [employer] class and look into [employer] for additional exercise.  Pt is appropriate for discharge at this time. OBJECTIVE IMPAIRMENTS: Abnormal gait, decreased activity tolerance, decreased balance, decreased coordination, decreased endurance, decreased ROM, decreased strength, impaired tone, and postural dysfunction.   ACTIVITY LIMITATIONS: carrying, lifting, bending, sitting, standing, squatting, stairs, transfers, bed mobility, bathing, dressing, reach over head, and hygiene/grooming  PARTICIPATION LIMITATIONS: meal prep, cleaning, laundry, community activity, yard work, and church  PERSONAL FACTORS: Age, Past/current experiences, Time since onset of injury/illness/exacerbation, and 3+ comorbidities: ADD, prostate CA, HLD, lyme disease are also affecting patient's functional outcome.   REHAB POTENTIAL: Good  CLINICAL DECISION MAKING: Evolving/moderate complexity  EVALUATION COMPLEXITY: Moderate  PLAN:  PT FREQUENCY: 1-2x/week  PT DURATION: 6 weeks  per recert 05/21/2024  PLANNED INTERVENTIONS: 97164- PT Re-evaluation, 97750- Physical Performance Testing, 97110-Therapeutic exercises, 97530- Therapeutic activity, 97112- Neuromuscular re-education, 97535- Self Care, [id number]- Manual therapy, [id number]- Gait training, [phone]- Canalith repositioning, Patient/Family education, Balance training, Stair training, Taping, Vestibular training, Cryotherapy, and Moist heat  PLAN FOR NEXT SESSION: Discharge this visit, with plan for return screen in 6-9 months.  Greig Anon, PT 06/24/24 11:58 AM Phone: [phone] Fax: [phone]  [facility] at [facility] [address] [place], [address] [place], [place] [address] Phone # [phone] Fax # [phone]

## 2024-06-24 NOTE — Patient Instructions (Signed)
 Bag Exercises:  Small trash bag or produce bag works best.  For all exercises, sit with big posture (sit up tall with head up) and use big movements. Perform the following exercises 1 times per day.  Hold bag in one hand. Stretch both arms/hands out to the side as big as you can. Then, pass bag from one hand to the other IN FRONT of you. Stretch arms back out big after each pass. Repeat 10 times. Hold bag in one hand. Stretch both arms/hands out to the side as big as you can. Then, pass bag from one hand to the other BEHIND you. Stretch arms back out big after each pass. Repeat 10 times. Hold bag in right hand. Move right hand to reach behind shoulder. Then, reach behind back with left hand to pass bag from right hand to left hand. Switch sides. Repeat 10 times on each side. Hold bag in both hands in front of you with hands/arms shoulder length apart. Move bag behind your head. Repeat 10 times.

## 2024-06-26 ENCOUNTER — Encounter

## 2024-07-01 ENCOUNTER — Ambulatory Visit

## 2024-07-03 ENCOUNTER — Ambulatory Visit: Admitting: Neurology

## 2024-07-03 ENCOUNTER — Encounter

## 2024-07-08 ENCOUNTER — Ambulatory Visit

## 2024-07-10 ENCOUNTER — Encounter

## 2024-07-15 ENCOUNTER — Encounter

## 2024-07-17 ENCOUNTER — Encounter

## 2024-07-17 ENCOUNTER — Ambulatory Visit

## 2024-07-18 ENCOUNTER — Encounter: Payer: Self-pay | Admitting: Family Medicine

## 2024-07-18 DIAGNOSIS — Z85828 Personal history of other malignant neoplasm of skin: Secondary | ICD-10-CM | POA: Diagnosis not present

## 2024-07-18 DIAGNOSIS — D485 Neoplasm of uncertain behavior of skin: Secondary | ICD-10-CM | POA: Diagnosis not present

## 2024-07-18 DIAGNOSIS — Z85820 Personal history of malignant melanoma of skin: Secondary | ICD-10-CM | POA: Diagnosis not present

## 2024-07-24 ENCOUNTER — Ambulatory Visit

## 2024-08-06 NOTE — Progress Notes (Unsigned)
 Assessment/Plan:   1.  Parkinsons disease, new dx             - Skin biopsy positive for alpha-synuclein             - He and I discussed again that I would like to see him wean off the Xanax , particularly the daytime Xanax .  His wife and daughter seem somewhat surprised that he was not on daytime Xanax , but the patient ultimately did admit that this was the case.    Discussed that Xanax  can cause confusion and gait instability and I do not like to see my parkinsonism patients on this medication, as Parkinson's alone can cause these problems.  He can talk with his prescribing NPP about this  -We decided to add carbidopa /levodopa  25/100.  1/2 tab tid x 1 wk, then 1/2 in am & noon & 1 at night for a week, then 1/2 in am &1 at noon &night for a week, then 1 po tid at 9 AM/1 PM/5 PM.  Risks, benefits, side effects and alternative therapies were discussed.  The opportunity to ask questions was given and they were answered to the best of my ability.  The patient expressed understanding and willingness to follow the outlined treatment protocols.  -I will refer the patient to the Parkinson's program for physical and speech therapy.  We talked about the importance of safe, cardiovascular exercise in Parkinson's disease.  -We discussed community resources in the area including patient support groups and community exercise programs for PD and pt education was provided to the patient.   -discussed MIND diet.  - Patient/wife/daughter had multiple questions and I answered those to the best of my ability today.  2.  Patient has had multiple acute episodes of expressive aphasia and trouble writing with negative stroke workup             - Given that he has had repetitive episodes over the years (actually over many years, nearly a decade) of inability to find words and trouble speaking followed by vision change, one has to wonder whether or not these are not complicated migraine.  Stroke workup in the past has been  unremarkable.  Seizure also could be on the differential diagnosis, but is a bit less likely.  - Cardiology ordered zio patch and it came in the mail, but ultimately patient did not want to proceed with that   3.  GAD  - I do think that the patient needs a veterinary surgeon.  He and I discussed this today.  - He asked about being irritated and a generalized feeling of frustration.  Ultimately, it was difficult for me to determine if this was either tremor, or if this was anxiety in general.  I told him to see if the levodopa  helped.  If not, he could follow-up with his psychiatry NPP  4.  Hyperreflexia             - Likely physiologic.  No abnormal reflexes.  May consider MRI cervical spine if neck pain develops or signs/symptoms of cervical myelopathy.  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  5.  Constipation  -discussed nature and pathophysiology and association with PD  -discussed importance of hydration.  Pt is to increase water  intake  - Discussed increased exercise.   Subjective:   Peter Novak was seen today in follow up for testing results.  Wife present and supplements the history.  Daughter on the phone and asks questions.  Patient was diagnosed with Parkinson's disease and started on levodopa  last visit.  He is tolerating medication well, without side effects.  He has also attended physical and Occupational Therapy since last visit and those notes are reviewed.  He has not had falls.  No syncopal episodes.  I did talk to the patient last visit about trying to get off of the Xanax , and the patient reports that ***he is no longer on that medication.  Current movement disorder medications: Carbidopa /levodopa  25/100, 1 tablet 3 times per day    ALLERGIES:  No Known Allergies  CURRENT MEDICATIONS:  No outpatient medications have been marked as taking for the 08/08/24 encounter  (Appointment) with Annelies Coyt, Asberry RAMAN, DO.     Objective:   PHYSICAL EXAMINATION:    VITALS:   There were no vitals filed for this visit.   GEN:  The patient appears stated age and is in NAD. HEENT:  Normocephalic, atraumatic.  The mucous membranes are moist. The superficial temporal arteries are without ropiness or tenderness. CV:  RRR Lungs:  CTAB Neck/HEME:  There are no carotid bruits bilaterally.  Neurological examination:  Orientation: The patient is alert and oriented x3. Cranial nerves: There is good facial symmetry with facial hypomimia. The speech is fluent and clear but hypophonic. Soft palate rises symmetrically and there is no tongue deviation. Hearing is intact to conversational tone. Sensation: Sensation is intact to light touch throughout Motor: Strength is at least antigravity x4.  Movement examination: Tone: There is mild increased tone in the RUE Abnormal movements: none Coordination:  There is  decremation with RAM's, with any form of RAMS, including alternating supination and pronation of the forearm, hand opening and closing, finger taps, heel taps and toe taps, L>R Gait and Station: The patient has mild difficulty arising out of a deep-seated chair without the use of the hands (able to do on the 2nd attempt). The patient's stride length is just slightly forward flexed.  Armswing is decreased bilaterally, right greater than left.  Stride length is good.  I have reviewed and interpreted the following labs independently    Chemistry      Component Value Date/Time   NA 138 01/23/2024 0942   NA 140 04/16/2019 1137   K 3.9 01/23/2024 0942   CL 105 01/23/2024 0942   CO2 24 01/23/2024 0942   BUN 25 (H) 01/23/2024 0942   BUN 18 04/16/2019 1137   CREATININE 0.97 01/23/2024 0942   CREATININE 1.05 01/27/2023 1609      Component Value Date/Time   CALCIUM  9.7 01/23/2024 0942   ALKPHOS 78 04/08/2024 0825   AST 16 04/08/2024 0825   ALT 18 04/08/2024 0825    BILITOT 0.9 04/08/2024 0825       Lab Results  Component Value Date   WBC 4.3 01/23/2024   HGB 13.5 01/23/2024   HCT 40.3 01/23/2024   MCV 88.3 01/23/2024   PLT 204.0 01/23/2024    Lab Results  Component Value Date   TSH 2.29 09/06/2023     Total time spent on today's visit was *** minutes, including both face-to-face time and nonface-to-face time.  Time included that spent on review of records (prior notes available to me/labs/imaging if pertinent), discussing treatment and goals, answering patient's questions and coordinating care.  Cc:  Katrinka Garnette KIDD, MD

## 2024-08-08 ENCOUNTER — Encounter: Payer: Self-pay | Admitting: Neurology

## 2024-08-08 ENCOUNTER — Ambulatory Visit (INDEPENDENT_AMBULATORY_CARE_PROVIDER_SITE_OTHER): Admitting: Neurology

## 2024-08-08 VITALS — BP 126/82 | HR 52 | Ht 75.0 in | Wt 226.2 lb

## 2024-08-08 DIAGNOSIS — K5901 Slow transit constipation: Secondary | ICD-10-CM | POA: Diagnosis not present

## 2024-08-08 DIAGNOSIS — G20A1 Parkinson's disease without dyskinesia, without mention of fluctuations: Secondary | ICD-10-CM | POA: Diagnosis not present

## 2024-08-08 DIAGNOSIS — F411 Generalized anxiety disorder: Secondary | ICD-10-CM | POA: Diagnosis not present

## 2024-08-08 NOTE — Patient Instructions (Signed)
°  VISIT SUMMARY: During your visit, we discussed the management of your Parkinson's disease, orthostatic hypotension, constipation, and anxiety. We reviewed your current medications and made adjustments to improve your symptom control. We also addressed your hydration and exercise routines, and provided referrals for additional support.  YOUR PLAN: -PARKINSON'S DISEASE: Parkinson's disease is a progressive nervous system disorder that affects movement. We adjusted your carbidopa -levodopa  dosing to 8 AM, noon, and 4 PM to improve symptom control. You should increase your water  intake to 60-70 ounces daily and continue your exercise regimen with walking, power moves class, and gym activities. We also sent your carbidopa -levodopa  prescription to the pharmacy and referred you to psychology for counseling services.  -ORTHOSTATIC HYPOTENSION: Orthostatic hypotension is a form of low blood pressure that happens when you stand up from sitting or lying down. To manage this, you should increase your water  intake to 60-70 ounces daily and monitor your blood pressure at home. If non-pharmacological measures are insufficient, we may consider medication for blood pressure.  -CONSTIPATION: Constipation is a condition where you have difficulty passing stools. This is likely related to your Parkinson's disease and inadequate hydration. You should increase your water  intake to 60-70 ounces daily and continue using Miralax  and prunes as needed.  -GENERALIZED ANXIETY DISORDER: Generalized anxiety disorder involves persistent and excessive worry about various aspects of life. You experience anxiety and frustration, especially in social situations and communication challenges. We have referred you to psychology for counseling services.  INSTRUCTIONS: Please follow up with psychology for counseling services. Continue to monitor your blood pressure at home and adjust your water  intake as discussed. If you experience any new or  worsening symptoms, please contact our office.                      Contains text generated by [employer]bridge.                                 Contains text generated by Abridge.

## 2024-09-06 ENCOUNTER — Encounter: Payer: Self-pay | Admitting: Family Medicine

## 2024-09-06 ENCOUNTER — Ambulatory Visit: Payer: Medicare PPO | Admitting: Family Medicine

## 2024-09-06 ENCOUNTER — Ambulatory Visit: Payer: Self-pay | Admitting: Family Medicine

## 2024-09-06 VITALS — BP 138/72 | HR 48 | Temp 97.6°F | Ht 75.0 in | Wt 227.6 lb

## 2024-09-06 DIAGNOSIS — Z131 Encounter for screening for diabetes mellitus: Secondary | ICD-10-CM | POA: Diagnosis not present

## 2024-09-06 DIAGNOSIS — R739 Hyperglycemia, unspecified: Secondary | ICD-10-CM | POA: Diagnosis not present

## 2024-09-06 DIAGNOSIS — Z0000 Encounter for general adult medical examination without abnormal findings: Secondary | ICD-10-CM

## 2024-09-06 DIAGNOSIS — E538 Deficiency of other specified B group vitamins: Secondary | ICD-10-CM

## 2024-09-06 DIAGNOSIS — E782 Mixed hyperlipidemia: Secondary | ICD-10-CM | POA: Diagnosis not present

## 2024-09-06 DIAGNOSIS — Z8546 Personal history of malignant neoplasm of prostate: Secondary | ICD-10-CM

## 2024-09-06 DIAGNOSIS — G20A1 Parkinson's disease without dyskinesia, without mention of fluctuations: Secondary | ICD-10-CM

## 2024-09-06 DIAGNOSIS — C61 Malignant neoplasm of prostate: Secondary | ICD-10-CM

## 2024-09-06 DIAGNOSIS — R413 Other amnesia: Secondary | ICD-10-CM | POA: Diagnosis not present

## 2024-09-06 LAB — COMPREHENSIVE METABOLIC PANEL WITH GFR
ALT: 7 U/L (ref 3–53)
AST: 16 U/L (ref 5–37)
Albumin: 4.5 g/dL (ref 3.5–5.2)
Alkaline Phosphatase: 67 U/L (ref 39–117)
BUN: 15 mg/dL (ref 6–23)
CO2: 27 meq/L (ref 19–32)
Calcium: 9.3 mg/dL (ref 8.4–10.5)
Chloride: 105 meq/L (ref 96–112)
Creatinine, Ser: 1.02 mg/dL (ref 0.40–1.50)
GFR: 72.15 mL/min
Glucose, Bld: 104 mg/dL — ABNORMAL HIGH (ref 70–99)
Potassium: 4.3 meq/L (ref 3.5–5.1)
Sodium: 139 meq/L (ref 135–145)
Total Bilirubin: 1.3 mg/dL — ABNORMAL HIGH (ref 0.2–1.2)
Total Protein: 7.1 g/dL (ref 6.0–8.3)

## 2024-09-06 LAB — CBC WITH DIFFERENTIAL/PLATELET
Basophils Absolute: 0 K/uL (ref 0.0–0.1)
Basophils Relative: 1.2 % (ref 0.0–3.0)
Eosinophils Absolute: 0.1 K/uL (ref 0.0–0.7)
Eosinophils Relative: 3.1 % (ref 0.0–5.0)
HCT: 39.9 % (ref 39.0–52.0)
Hemoglobin: 13.3 g/dL (ref 13.0–17.0)
Lymphocytes Relative: 27.2 % (ref 12.0–46.0)
Lymphs Abs: 1.1 K/uL (ref 0.7–4.0)
MCHC: 33.4 g/dL (ref 30.0–36.0)
MCV: 89.2 fl (ref 78.0–100.0)
Monocytes Absolute: 0.4 K/uL (ref 0.1–1.0)
Monocytes Relative: 9.6 % (ref 3.0–12.0)
Neutro Abs: 2.4 K/uL (ref 1.4–7.7)
Neutrophils Relative %: 58.9 % (ref 43.0–77.0)
Platelets: 201 K/uL (ref 150.0–400.0)
RBC: 4.48 Mil/uL (ref 4.22–5.81)
RDW: 14.1 % (ref 11.5–15.5)
WBC: 4 K/uL (ref 4.0–10.5)

## 2024-09-06 LAB — LIPID PANEL
Cholesterol: 130 mg/dL (ref 28–200)
HDL: 48.6 mg/dL
LDL Cholesterol: 58 mg/dL (ref 10–99)
NonHDL: 81.07
Total CHOL/HDL Ratio: 3
Triglycerides: 115 mg/dL (ref 10.0–149.0)
VLDL: 23 mg/dL (ref 0.0–40.0)

## 2024-09-06 LAB — VITAMIN B12: Vitamin B-12: 419 pg/mL (ref 211–911)

## 2024-09-06 LAB — HEMOGLOBIN A1C: Hgb A1c MFr Bld: 6 % (ref 4.6–6.5)

## 2024-09-06 NOTE — Progress Notes (Signed)
 " Phone: [phone]   Subjective:  Patient presents today for their annual physical. Chief complaint-noted.   See problem oriented charting- ROS- full  review of systems was completed and negative  except for topics noted under acute/chronic concerns  The following were reviewed and entered/updated in epic: Past Medical History:  Diagnosis Date   ADD (attention deficit disorder with hyperactivity)    Allergy    seasonal   Arthritis    Cancer (HCC)    prostate cancer   Cataract    bilateral,removed   Complication of anesthesia    Constipation    GERD (gastroesophageal reflux disease)    GERD (gastroesophageal reflux disease)    Hearing loss    History of skin cancer    Melanoma X 2   Hyperlipidemia    Lyme disease 01/2021   Prostate cancer (HCC) 2016   Patient Active Problem List   Diagnosis Date Noted   Parkinson's disease without dyskinesia or fluctuating manifestations (HCC) 04/08/2024    Priority: High   Recurrent episodes Dizziness/Ataxia 11/05/2019    Priority: High   White matter abnormality on MRI of brain 04/16/2019    Priority: High   Migraine with aura and with status migrainosus, not intractable 04/16/2019    Priority: High   Ocular migraine 04/16/2019    Priority: High   ADD (attention deficit disorder) 10/02/2014    Priority: Medium    Insomnia 10/02/2014    Priority: Medium    Osteoarthritis 10/02/2014    Priority: Medium    Prostate cancer (HCC) 02/12/2013    Priority: Medium    Hyperglycemia 11/30/2009    Priority: Medium    History of melanoma 11/30/2009    Priority: Medium    HYPERLIPIDEMIA 07/10/2007    Priority: Medium    Bradycardia 11/05/2019    Priority: Low   Migraine aura without headache (migraine equivalents) 04/16/2019    Priority: Low   Erectile dysfunction 10/02/2014    Priority: Low   Eustachian tube dysfunction 10/02/2014    Priority: Low   GERD (gastroesophageal reflux disease) 02/12/2013    Priority: Low   Benign  neoplasm of adrenal gland 11/30/2009    Priority: Low   Fatigue 01/16/2024   Excessive daytime sleepiness 01/16/2024   Genetic testing 09/21/2021   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   Past Surgical History:  Procedure Laterality Date   CATARACT EXTRACTION, BILATERAL  March 2022 and April 2022   CHOLECYSTECTOMY  2005   COLONOSCOPY  2008   negative   ELBOW SURGERY Right 1994   ulnar transposition   EYE SURGERY  2021   cataract   HERNIA REPAIR  2005   INNER EAR SURGERY  2006   Incus transposition;oscillculoplasty; Dr Thaddeus GRIST EAR SURGERY  2011   Stirrup resection   LYMPHADENECTOMY Bilateral 11/04/2021   Procedure: LYMPHADENECTOMY, PELVIC;  Surgeon: Renda Glance, MD;  Location: [facility];  Service: Urology;  Laterality: Bilateral;   POLYPECTOMY     ROBOT ASSISTED LAPAROSCOPIC RADICAL PROSTATECTOMY N/A 11/04/2021   Procedure: XI ROBOTIC ASSISTED LAPAROSCOPIC RADICAL PROSTATECTOMY LEVEL 3;  Surgeon: Renda Glance, MD;  Location: [facility];  Service: Urology;  Laterality: N/A;   SHOULDER ARTHROSCOPY Left 2004   Dr Liam   TONSILLECTOMY AND ADENOIDECTOMY     VASECTOMY      Family History  Problem Relation Age of Onset   Diverticulosis Mother 24   Heart failure Mother    Diabetes Father        AAA  Heart failure Father    CAD Brother 85   Diabetes Brother    Hyperlipidemia Brother    Heart attack Brother        COD @57    Obesity Brother    Heart failure Brother    Cancer Paternal Grandmother 37       unknown type   Heart attack Paternal Grandfather 94   Hashimoto's thyroiditis Daughter    Healthy Son    Colon cancer Maternal Aunt        dx. 60s   Breast cancer Cousin 60   Stroke Neg Hx    Neuropathy Neg Hx    Multiple sclerosis Neg Hx    Migraines Neg Hx    Dementia Neg Hx    Colon polyps Neg Hx    Crohn's disease Neg Hx    Esophageal cancer Neg Hx    Rectal cancer Neg Hx    Stomach cancer Neg Hx    Ulcerative colitis Neg Hx     Medications-  reviewed and updated Current Outpatient Medications  Medication Sig Dispense Refill   acetaminophen  (TYLENOL ) 325 MG tablet Take 650 mg by mouth as needed for moderate pain.     aspirin  EC 81 MG tablet Take 1 tablet (81 mg total) by mouth daily. Swallow whole. 30 tablet 12   atorvastatin  (LIPITOR) 80 MG tablet Take 1 tablet (80 mg total) by mouth daily. 90 tablet 3   carbidopa -levodopa  (SINEMET  IR) 25-100 MG tablet Take 1 tablet by mouth 3 (three) times daily. 8am/noon/4pm 270 tablet 1   Cholecalciferol (VITAMIN D3 PO) Take by mouth daily.     clopidogrel  (PLAVIX ) 75 MG tablet Take 1 tablet (75 mg total) by mouth daily. 90 tablet 3   Cyanocobalamin  (VITAMIN B-12 SL) Place under the tongue daily.     fluticasone  (FLONASE ) 50 MCG/ACT nasal spray Place 2 sprays into both nostrils daily.     hydrocortisone 2.5 % cream Apply 1 Application topically as needed.     Multiple Vitamin (MULTIVITAMIN ADULT PO) Take by mouth daily.     pantoprazole  (PROTONIX ) 40 MG tablet TAKE 1 TABLET BY MOUTH DAILY 90 tablet 3   polyethylene glycol (MIRALAX  / GLYCOLAX ) 17 g packet Take 17 g by mouth as needed.     No current facility-administered medications for this visit.    Allergies-reviewed and updated Allergies[1]  Social History   Social History Narrative   ELINORE Light 2021. Married 2006, divorced 2010. 2 children (35 and 32 in 2016 in good health), no grandkids.    [employer] grad.       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       Hobbies: place in [place] in [place], outdoor activities, reading, follow things on PC      Right handed      Lives at home with wife and dog       Caffeine: 2 cups/day   Objective  Objective:  BP 138/72 (BP Location: Left Arm, Patient Position: Sitting)   Pulse (!) 48   Temp 97.6 F (36.4 C) (Temporal)   Ht 6' 3 (1.905 m)   Wt 227 lb 9.6 oz (103.2 kg)   SpO2 98%   BMI 28.45 kg/m  Gen: NAD, resting  comfortably HEENT: Mucous membranes are moist. Oropharynx normal Neck: no thyromegaly CV: RRR no murmurs rubs or gallops Lungs: CTAB no crackles, wheeze, rhonchi Abdomen: soft/nontender/nondistended/normal bowel sounds. No rebound or guarding.  Ext: no edema  Skin: warm, dry Neuro; bradykinesia noted moves all extremities, PERRLA    Assessment and Plan  74 y.o. male presenting for annual physical.  Health Maintenance counseling: 1. Anticipatory guidance: Patient counseled regarding regular dental exams -q6 months, eye exams - yearly,  avoiding smoking and second hand smoke , limiting alcohol to 2 beverages per day - 2-3 a week, no illicit drugs .   2. Risk factor reduction:  Advised patient of need for regular exercise and diet rich and fruits and vegetables to reduce risk of heart attack and stroke.  Exercise- [employer] 2 days a week and also at Parkinson's disease class at [facility] Diet/weight management-Down 5 pounds from last physical-continue to try to eat healthy diet.  Wt Readings from Last 3 Encounters:  09/06/24 227 lb 9.6 oz (103.2 kg)  08/08/24 226 lb 3.2 oz (102.6 kg)  04/16/24 220 lb 12.8 oz (100.2 kg)  3. Immunizations/screenings/ancillary studies-up-to-date other than holding off on COVID  Immunization History  Administered Date(s) Administered   Fluad Quad(high Dose 65+) 07/06/2020, 06/17/2021, 06/11/2024   INFLUENZA, HIGH DOSE SEASONAL PF 06/02/2017, 05/21/2018, 05/16/2019, 07/07/2023   Influenza,inj,Quad PF,6+ Mos 06/13/2019   Influenza,inj,quad, With Preservative 06/19/2015   Influenza-Unspecified 06/12/2014, 06/01/2017, 05/29/2018, 06/12/2022, 07/01/2022   PFIZER(Purple Top)SARS-COV-2 Vaccination 11/14/2019, 12/09/2019   Pneumococcal Conjugate-13 10/02/2014   Pneumococcal Polysaccharide-23 10/14/2015   Td 11/30/2009   Tdap 09/30/2015   Zoster Recombinant(Shingrix) 06/07/2018, 09/21/2018   Zoster, Live 03/23/2011  4. Prostate cancer screening-see below Lab  Results  Component Value Date   PSA 0.01 (L) 09/06/2023   PSA 10.56 (H) 03/23/2021   PSA 9.10 (H) 07/09/2020   5. Colon cancer screening - 07/08/2022 final colonoscopy reassuring- was told no further colonoscopy 6. Skin cancer screening-working closely with [facility]-had to have reexcision for dysplastic nevus. advised regular sunscreen use. Denies worrisome, changing, or new skin lesions.  7. Smoking associated screening (lung cancer screening, AAA screen 65-75, UA)- never smoker 8. STD screening - only active with Andree long term girlfriend   Status of chronic or acute concerns   #social update- GF Andree dealing with giant cell arteritis  #prostate cancer with removal March 2023 but with extra prostatic extension but likely PSA has remained undetectable identified-followed by Dr. Renda and things have been going well-psaw was undetectable earlier this month and planis for 6 month recheck with Dr. Renda  # Parkinson's-diagnosed in 2025 and followed by Dr. Evonnie S: Medication: Carbidopa  levodopa  25-100 mg working towards 3 times a day- 8, 12, and 4 pm. Still trying to discover what need for medicine feels like- may have experienced last night around 6 feeling more jittery then took medicine and by 7 felt more back to normal.  -Extensive workup in regards to fatigue-slightly low B12.  No sleep apnea present per Dr. Chalice June 2025, reassuring echocardiogram -constipation likely related - doing some magnesium  and seems to help A/P: overall stable- continue close follow up with Dr. Evonnie and continue current medications    #History of recurrent TIA first 2010 and as late as 2025- also per Dr. Evonnie ? Complicated migraine #nonobstructive CAD  #hyperlipidemia-LPA not elevated S: Medication:Clopidogrel  75 mg, atorvastatin  80 mg prescribed-  hopefully #s better- wants to focus on lifestyle over upping medications if possible -Neurology Dr. Darleen has recommended loop recorder and he has  declined  Lab Results  Component Value Date   CHOL 148 04/08/2024   HDL 42 04/08/2024   LDLCALC 80 04/08/2024   LDLDIRECT 73 07/09/2020  TRIG 146 04/08/2024   CHOLHDL 3.5 04/08/2024  A/P: lipids slightly above ideal goal- he has remained on 40 mg atorvastatin - we will check levels- hed like to be less aggressive then going straight to 80 mg with how close he is to goal- perhaps 60 mg or 40 alternativing with 60 LDL goal is for nonobstructive CAD as well as history TIA but Dr. Evonnie makes some interesting observations about possible ocmplicated migraine instead  # relative B12 deficiency- in 200's in 2025 S: Current treatment/medication (oral vs. IM): B12 1000 mcg daily     Lab Results  Component Value Date   VITAMINB12 283 09/06/2023    A/P: hopefully improved- update b12 today. Continue current meds for now    # ADD-not on medications right now from psychiatry  #Orthostatic symptom(s) - blood pressure better today- does better with hydration. Doing about 80 oz a day  Recommended follow up: Return in about 6 months (around 03/06/2025) for followup or sooner if needed.Schedule b4 you leave. Future Appointments  Date Time Provider Department Center  10/02/2024 10:00 AM Hollace Richerd CROME, LMFT [facility] None  02/13/2025 10:45 AM Tat, Asberry RAMAN, DO [facility] None   Lab/Order associations: fasting   ICD-10-CM   1. Preventative health care  Z00.00     2. Parkinson's disease without dyskinesia or fluctuating manifestations (HCC)  G20.A1     3. Hyperglycemia  R73.9     4. Screening for diabetes mellitus  Z13.1     5. Memory loss  R41.3     6. HYPERLIPIDEMIA  E78.2     7. Prostate cancer (HCC)  C61     8. B12 deficiency  E53.8       No orders of the defined types were placed in this encounter.   Return precautions advised.  Garnette Lukes, MD      [1] No Known Allergies  "

## 2024-09-06 NOTE — Patient Instructions (Addendum)
 Please stop by lab before you go If you have mychart- we will send your results within 3 business days of us  receiving them.  If you do not have mychart- we will call you about results within 5 business days of us  receiving them.  *please also note that you will see labs on mychart as soon as they post. I will later go in and write notes on them- will say notes from Dr. Katrinka   No changes today unless labs lead us  to make changes  Great job on continued gradual weight loss and great job with exercise  Recommended follow up: Return in about 6 months (around 03/06/2025) for followup or sooner if needed.Schedule b4 you leave. Or definitely yearly for physical

## 2024-10-02 ENCOUNTER — Ambulatory Visit: Admitting: Behavioral Health

## 2025-02-13 ENCOUNTER — Ambulatory Visit: Payer: Self-pay | Admitting: Neurology

## 2025-03-07 ENCOUNTER — Ambulatory Visit: Admitting: Family Medicine

## 2025-09-09 ENCOUNTER — Encounter: Admitting: Family Medicine
